# Patient Record
Sex: Male | Born: 1950
Health system: Southern US, Community
[De-identification: ages and names within clinical notes are randomized; demographics above are authoritative.]

## PROBLEM LIST (undated history)

## (undated) DIAGNOSIS — I10 Essential (primary) hypertension: Secondary | ICD-10-CM

## (undated) DIAGNOSIS — I6350 Cerebral infarction due to unspecified occlusion or stenosis of unspecified cerebral artery: Secondary | ICD-10-CM

## (undated) DIAGNOSIS — J439 Emphysema, unspecified: Secondary | ICD-10-CM

## (undated) DIAGNOSIS — R0683 Snoring: Secondary | ICD-10-CM

## (undated) DIAGNOSIS — I639 Cerebral infarction, unspecified: Secondary | ICD-10-CM

## (undated) DIAGNOSIS — D573 Sickle-cell trait: Secondary | ICD-10-CM

## (undated) DIAGNOSIS — A159 Respiratory tuberculosis unspecified: Secondary | ICD-10-CM

## (undated) HISTORY — DX: Respiratory tuberculosis unspecified: A15.9

## (undated) HISTORY — DX: Snoring: R06.83

## (undated) HISTORY — DX: Emphysema, unspecified: J43.9

## (undated) HISTORY — DX: Sickle-cell trait: D57.3

## (undated) HISTORY — DX: Cerebral infarction due to unspecified occlusion or stenosis of unspecified cerebral artery: I63.50

---

## 1975-03-10 HISTORY — PX: APPENDECTOMY: SHX54

## 1988-03-09 HISTORY — PX: VASECTOMY: SHX75

## 2002-01-26 ENCOUNTER — Ambulatory Visit (HOSPITAL_COMMUNITY): Admission: RE | Admit: 2002-01-26 | Discharge: 2002-01-26 | Payer: Self-pay | Admitting: Internal Medicine

## 2002-01-26 ENCOUNTER — Encounter: Payer: Self-pay | Admitting: Internal Medicine

## 2002-04-25 ENCOUNTER — Encounter: Payer: Self-pay | Admitting: Internal Medicine

## 2002-04-25 ENCOUNTER — Ambulatory Visit (HOSPITAL_COMMUNITY): Admission: RE | Admit: 2002-04-25 | Discharge: 2002-04-25 | Payer: Self-pay | Admitting: Internal Medicine

## 2002-11-06 ENCOUNTER — Inpatient Hospital Stay (HOSPITAL_COMMUNITY): Admission: EM | Admit: 2002-11-06 | Discharge: 2002-11-17 | Payer: Self-pay | Admitting: Psychiatry

## 2003-01-12 ENCOUNTER — Inpatient Hospital Stay (HOSPITAL_COMMUNITY): Admission: EM | Admit: 2003-01-12 | Discharge: 2003-01-22 | Payer: Self-pay | Admitting: Psychiatry

## 2004-12-31 ENCOUNTER — Ambulatory Visit: Payer: Self-pay | Admitting: Family Medicine

## 2005-01-21 ENCOUNTER — Ambulatory Visit: Payer: Self-pay | Admitting: *Deleted

## 2006-04-25 ENCOUNTER — Emergency Department (HOSPITAL_COMMUNITY): Admission: EM | Admit: 2006-04-25 | Discharge: 2006-04-25 | Payer: Self-pay | Admitting: Emergency Medicine

## 2006-04-29 ENCOUNTER — Emergency Department (HOSPITAL_COMMUNITY): Admission: EM | Admit: 2006-04-29 | Discharge: 2006-04-29 | Payer: Self-pay | Admitting: Emergency Medicine

## 2007-12-12 ENCOUNTER — Emergency Department (HOSPITAL_COMMUNITY): Admission: EM | Admit: 2007-12-12 | Discharge: 2007-12-12 | Payer: Self-pay | Admitting: Family Medicine

## 2008-06-05 ENCOUNTER — Emergency Department (HOSPITAL_COMMUNITY): Admission: EM | Admit: 2008-06-05 | Discharge: 2008-06-05 | Payer: Self-pay | Admitting: Family Medicine

## 2008-07-24 HISTORY — PX: PACEMAKER INSERTION: SHX728

## 2008-10-03 ENCOUNTER — Inpatient Hospital Stay (HOSPITAL_COMMUNITY): Admission: EM | Admit: 2008-10-03 | Discharge: 2008-10-10 | Payer: Self-pay | Admitting: Emergency Medicine

## 2008-10-03 DIAGNOSIS — I639 Cerebral infarction, unspecified: Secondary | ICD-10-CM | POA: Insufficient documentation

## 2008-10-03 HISTORY — DX: Cerebral infarction, unspecified: I63.9

## 2008-10-04 ENCOUNTER — Encounter (INDEPENDENT_AMBULATORY_CARE_PROVIDER_SITE_OTHER): Payer: Self-pay | Admitting: Internal Medicine

## 2008-10-05 ENCOUNTER — Encounter (INDEPENDENT_AMBULATORY_CARE_PROVIDER_SITE_OTHER): Payer: Self-pay | Admitting: Internal Medicine

## 2008-10-09 ENCOUNTER — Encounter (INDEPENDENT_AMBULATORY_CARE_PROVIDER_SITE_OTHER): Payer: Self-pay | Admitting: Cardiology

## 2008-10-09 ENCOUNTER — Encounter (INDEPENDENT_AMBULATORY_CARE_PROVIDER_SITE_OTHER): Payer: Self-pay | Admitting: Internal Medicine

## 2008-10-17 ENCOUNTER — Ambulatory Visit: Payer: Self-pay | Admitting: Internal Medicine

## 2008-10-17 DIAGNOSIS — H532 Diplopia: Secondary | ICD-10-CM | POA: Insufficient documentation

## 2008-10-17 DIAGNOSIS — R000 Tachycardia, unspecified: Secondary | ICD-10-CM | POA: Insufficient documentation

## 2008-10-17 DIAGNOSIS — I6350 Cerebral infarction due to unspecified occlusion or stenosis of unspecified cerebral artery: Secondary | ICD-10-CM

## 2008-10-17 DIAGNOSIS — R42 Dizziness and giddiness: Secondary | ICD-10-CM | POA: Insufficient documentation

## 2008-10-17 DIAGNOSIS — R269 Unspecified abnormalities of gait and mobility: Secondary | ICD-10-CM | POA: Insufficient documentation

## 2008-10-17 DIAGNOSIS — I498 Other specified cardiac arrhythmias: Secondary | ICD-10-CM | POA: Insufficient documentation

## 2008-10-17 HISTORY — DX: Cerebral infarction due to unspecified occlusion or stenosis of unspecified cerebral artery: I63.50

## 2008-10-26 ENCOUNTER — Ambulatory Visit: Payer: Self-pay | Admitting: Internal Medicine

## 2008-10-26 DIAGNOSIS — Z9189 Other specified personal risk factors, not elsewhere classified: Secondary | ICD-10-CM | POA: Insufficient documentation

## 2008-10-29 ENCOUNTER — Telehealth: Payer: Self-pay | Admitting: Licensed Clinical Social Worker

## 2008-10-29 ENCOUNTER — Encounter: Payer: Self-pay | Admitting: Licensed Clinical Social Worker

## 2008-11-02 ENCOUNTER — Ambulatory Visit (HOSPITAL_COMMUNITY): Admission: RE | Admit: 2008-11-02 | Discharge: 2008-11-02 | Payer: Self-pay | Admitting: Cardiology

## 2008-11-05 ENCOUNTER — Encounter (INDEPENDENT_AMBULATORY_CARE_PROVIDER_SITE_OTHER): Payer: Self-pay | Admitting: Internal Medicine

## 2008-11-13 ENCOUNTER — Encounter (INDEPENDENT_AMBULATORY_CARE_PROVIDER_SITE_OTHER): Payer: Self-pay | Admitting: Internal Medicine

## 2008-11-16 ENCOUNTER — Telehealth (INDEPENDENT_AMBULATORY_CARE_PROVIDER_SITE_OTHER): Payer: Self-pay | Admitting: *Deleted

## 2008-11-22 ENCOUNTER — Ambulatory Visit: Payer: Self-pay | Admitting: Internal Medicine

## 2008-11-22 ENCOUNTER — Encounter: Payer: Self-pay | Admitting: Internal Medicine

## 2008-11-22 DIAGNOSIS — I10 Essential (primary) hypertension: Secondary | ICD-10-CM | POA: Insufficient documentation

## 2008-11-22 LAB — CONVERTED CEMR LAB
ALT: 12 units/L (ref 0–53)
Alkaline Phosphatase: 93 units/L (ref 39–117)
Basophils Absolute: 0 10*3/uL (ref 0.0–0.1)
Basophils Relative: 0 % (ref 0–1)
CO2: 27 meq/L (ref 19–32)
Eosinophils Absolute: 0.2 10*3/uL (ref 0.0–0.7)
Glucose, Urine, Semiquant: NEGATIVE
MCHC: 33.6 g/dL (ref 30.0–36.0)
MCV: 85.6 fL (ref >=78.0)
Neutrophils Relative %: 64 % (ref 43–77)
Platelets: 246 10*3/uL (ref 150–400)
Protein, U semiquant: 30
RDW: 14.7 % (ref 11.5–15.5)
Sodium: 144 meq/L (ref 135–145)
Specific Gravity, Urine: >=1.03
Total Bilirubin: 1 mg/dL (ref 0.3–1.2)
Total Protein: 6.7 g/dL (ref 6.0–8.3)
WBC: 5.7 10*3/uL (ref 4.0–10.5)

## 2008-11-23 ENCOUNTER — Encounter: Payer: Self-pay | Admitting: Internal Medicine

## 2008-11-23 ENCOUNTER — Telehealth: Payer: Self-pay | Admitting: Infectious Disease

## 2008-12-10 ENCOUNTER — Encounter (INDEPENDENT_AMBULATORY_CARE_PROVIDER_SITE_OTHER): Payer: Self-pay | Admitting: Internal Medicine

## 2009-01-11 ENCOUNTER — Ambulatory Visit (HOSPITAL_COMMUNITY): Admission: RE | Admit: 2009-01-11 | Discharge: 2009-01-12 | Payer: Self-pay | Admitting: Cardiology

## 2009-01-16 ENCOUNTER — Ambulatory Visit: Payer: Self-pay | Admitting: Infectious Disease

## 2009-01-16 DIAGNOSIS — N529 Male erectile dysfunction, unspecified: Secondary | ICD-10-CM | POA: Insufficient documentation

## 2009-01-16 LAB — CONVERTED CEMR LAB
Bacteria, UA: NONE SEEN
Leukocytes, UA: NEGATIVE
Nitrite: NEGATIVE
Protein, ur: 30 mg/dL — AB
RBC / HPF: NONE SEEN (ref <3)
Urine Glucose: NEGATIVE mg/dL
pH: 7 (ref 5.0–8.0)

## 2009-08-15 ENCOUNTER — Emergency Department (HOSPITAL_COMMUNITY): Admission: EM | Admit: 2009-08-15 | Discharge: 2009-08-15 | Payer: Self-pay | Admitting: Emergency Medicine

## 2009-09-30 ENCOUNTER — Ambulatory Visit: Payer: Self-pay | Admitting: Internal Medicine

## 2009-09-30 DIAGNOSIS — G43009 Migraine without aura, not intractable, without status migrainosus: Secondary | ICD-10-CM | POA: Insufficient documentation

## 2009-09-30 DIAGNOSIS — E785 Hyperlipidemia, unspecified: Secondary | ICD-10-CM | POA: Insufficient documentation

## 2009-10-01 LAB — CONVERTED CEMR LAB
AST: 21 units/L (ref 0–37)
BUN: 14 mg/dL (ref 6–23)
Calcium: 9.5 mg/dL (ref 8.4–10.5)
Chloride: 103 meq/L (ref 96–112)
Creatinine, Ser: 0.88 mg/dL (ref 0.40–1.50)
Glucose, Bld: 91 mg/dL (ref 70–99)
HDL: 46 mg/dL (ref >39)
TSH: 0.79 microintl units/mL (ref 0.350–4.5)
Total CHOL/HDL Ratio: 3.6
Triglycerides: 60 mg/dL (ref <150)

## 2009-12-22 ENCOUNTER — Emergency Department (HOSPITAL_COMMUNITY)
Admission: EM | Admit: 2009-12-22 | Discharge: 2009-12-22 | Payer: Self-pay | Source: Home / Self Care | Admitting: Emergency Medicine

## 2010-04-08 NOTE — Assessment & Plan Note (Signed)
Summary: EST-CK/FU/MEDS/CFB   Vital Signs:  Patient profile:   60 year old male Height:      61 inches (154.94 cm) Weight:      157.3 pounds (71.50 kg) BMI:     29.83 Temp:     98.2 degrees F (36.78 degrees C) oral Pulse rate:   80 / minute BP sitting:   118 / 79  (left arm) Cuff size:   regular  Vitals Entered By: Cynda Familia Duncan Dull) (September 30, 2009 2:03 PM)   Primary Care Provider:  Lars Mage MD   History of Present Illness: [name] is a 60 year old man with PMH as described by EMR most notable for stroke in 2010 is here today for a regular follow up and medication refill.  He is not using a cane anymore and feels alot better. cell phone number: [phone]  He had pacemaker placement last Nov. He is feeling better after he had pacemaker.    He has had headaches since July 2010 after he had stroke. Still has headaches which start from the back of his head, 6-7/10 at its worse, described as pounding, no aura noted, last anywhere from few minutes to entire day. There is no memory loss or post ictal confusion after headaches. No aggravating or relieving factors.   He denies blood in his urine.   He lost his taste after the stroke and he says that he has been eating alot of junk food and wants to improve his eating habits.  He also relates impotence since he had the stroke which is unchanges, he did not take the pill as it costed around 20$ a pill.  Preventive Screening-Counseling & Management  Alcohol-Tobacco     Alcohol drinks/day: 0     Smoking Status: current     Smoking Cessation Counseling: yes     Packs/Day: 1 - 3 CIG     Year Started: 40 YEARS  Problems Prior to Update: 1)  Hyperlipidemia  (ICD-272.4) 2)  Erectile Dysfunction, Organic  (ICD-607.84) 3)  Hematuria Unspecified  (ICD-599.70) 4)  Hypertension  (ICD-401.9) 5)  Snoring, Hx of  (ICD-V15.89) 6)  Dizziness  (ICD-780.4) 7)  Unspecified Tachycardia  (ICD-785.0) 8)  Bradycardia  (ICD-427.89) 9)   Cerebrovascular Accident  (ICD-434.91) 10)  Gait Ataxia  (ICD-781.2) 11)  Diplopia  (ICD-368.2)  Medications Prior to Update: 1)  Pravachol 20 Mg Tabs (Pravastatin Sodium) .... Take 1 Tablet By Mouth Once A Day 2)  Hydrochlorothiazide 12.5 Mg Caps (Hydrochlorothiazide) .... Take 1 Capsule By Mouth Once A Day 3)  Metoprolol Tartrate 25 Mg Tabs (Metoprolol Tartrate) .... Take 1 Tablet By Mouth Two Times A Day 4)  Aspirin 325 Mg Tabs (Aspirin) .... Take 1 Tablet By Mouth Once A Day 5)  Chlorhexidine Gluconate 0.12 % Soln (Chlorhexidine Gluconate) .... Swish 15ml For 30 Seconds Then Spit Twice A Day  Current Medications (verified): 1)  Pravachol 20 Mg Tabs (Pravastatin Sodium) .... Take 1 Tablet By Mouth Once A Day 2)  Hydrochlorothiazide 12.5 Mg Caps (Hydrochlorothiazide) .... Take 1 Capsule By Mouth Once A Day 3)  Aspirin 325 Mg Tabs (Aspirin) .... Take 1 Tablet By Mouth Once A Day 4)  Propranolol Hcl 20 Mg Tabs (Propranolol Hcl) .... Take 1 Tablet By Mouth Two Times A Day  Allergies (verified): No Known Drug Allergies  Past History:  Past Medical History: Last updated: 10/17/2008 Tuberculosis (at age 21-3)  Past Surgical History: Last updated: 10/17/2008 appendectomy 1977 vasectomy 1990  Family History:  Last updated: 09/30/2009 Son- sickle cell disease Sister- cancer  Social History: Last updated: 10/17/2008 Occupation: unemployed Married Lives at home wife and step-daughter Current Smoker- 1.5 cigarettes/day  smoked 1pack/week for 37yrs. Alcohol use-no Drug use-no  Risk Factors: Alcohol Use: 0 (09/30/2009) Exercise: no (11/22/2008)  Risk Factors: Smoking Status: current (09/30/2009) Packs/Day: 1 - 3 CIG (09/30/2009)  Family History: Reviewed history from 10/17/2008 and no changes required. Son- sickle cell disease Sister- cancer  Social History: Reviewed history from 10/17/2008 and no changes required. Occupation: unemployed Married Lives at home wife and  step-daughter Current Smoker- 1.5 cigarettes/day  smoked 1pack/week for 56yrs. Alcohol use-no Drug use-no Packs/Day:  1 - 3 CIG  Review of Systems      See HPI  Physical Exam  Additional Exam:  Gen: AOx3, in no acute distress Eyes: PERRL, EOMI ENT:MMM, No erythema noted in posterior pharynx Neck: No JVD, No LAP Chest: CTAB with  good respiratory effort CVS: regular rhythmic rate, NO M/R/G, S1 S2 normal Abdo: soft,ND, BS+x4, Non tender and No hepatosplenomegaly EXT: No odema noted Neuro: Non focal, gait is normal Skin: no rashes noted.    Impression & Recommendations:  Problem # 1:  MIGRAINE WITHOUT AURA (ICD-346.10) Assessment Deteriorated  Patient is on metoprolol for BP and given his description of headaches, it is most c/w migraine. I will change his med to propranolol for prophylaxis against Migraine. i told him to take tylenol OTC/Ibuprofen for breakthrough pain.  The following medications were removed from the medication list:    Metoprolol Tartrate 25 Mg Tabs (Metoprolol tartrate) .Marland Kitchen... Take 1 tablet by mouth two times a day His updated medication list for this problem includes:    Aspirin 325 Mg Tabs (Aspirin) .Marland Kitchen... Take 1 tablet by mouth once a day    Propranolol Hcl 20 Mg Tabs (Propranolol hcl) .Marland Kitchen... Take 1 tablet by mouth two times a day  Problem # 2:  HYPERLIPIDEMIA (ICD-272.4) Assessment: Unchanged I will check his fasting lipid profile tomorrow.  His updated medication list for this problem includes:    Pravachol 20 Mg Tabs (Pravastatin sodium) .Marland Kitchen... Take 1 tablet by mouth once a day  Orders: T-Lipid Profile ([id number]) T-CMP with Estimated GFR ([id number])  Labs Reviewed: SGOT: 17 (11/22/2008)   SGPT: 12 (11/22/2008)   HDL:47 (11/22/2008)  LDL:114 (11/22/2008)  Chol:174 (11/22/2008)  Trig:65 (11/22/2008)  Problem # 3:  HYPERTENSION (ICD-401.9) At goal. Will check CMP for crt and K.  The following medications were removed from the medication  list:    Metoprolol Tartrate 25 Mg Tabs (Metoprolol tartrate) .Marland Kitchen... Take 1 tablet by mouth two times a day His updated medication list for this problem includes:    Hydrochlorothiazide 12.5 Mg Caps (Hydrochlorothiazide) .Marland Kitchen... Take 1 capsule by mouth once a day    Propranolol Hcl 20 Mg Tabs (Propranolol hcl) .Marland Kitchen... Take 1 tablet by mouth two times a day  BP today: 118/79 Prior BP: 137/89 (01/16/2009)  Labs Reviewed: K+: 4.3 (11/22/2008) Creat: : 0.96 (11/22/2008)   Chol: 174 (11/22/2008)   HDL: 47 (11/22/2008)   LDL: 114 (11/22/2008)   TG: 65 (11/22/2008)  Problem # 4:  CEREBROVASCULAR ACCIDENT (ICD-434.91) Assessment: Comment Only cont. aspirin for sec prophylasix.  His updated medication list for this problem includes:    Aspirin 325 Mg Tabs (Aspirin) .Marland Kitchen... Take 1 tablet by mouth once a day  Problem # 5:  DIPLOPIA (ICD-368.2) Assessment: Improved improved now.  Problem # 6:  GAIT ATAXIA (ICD-781.2) Assessment: Improved improved significantly and he  is not using his cane anymore.  Problem # 7:  Preventive Health Care (ICD-V70.0) Assessment: Comment Only Tdap, Gi referral and tsh will be chekced.  Reviewed preventive care protocols, scheduled due services, and updated immunizations.  Complete Medication List: 1)  Pravachol 20 Mg Tabs (Pravastatin sodium) .... Take 1 tablet by mouth once a day 2)  Hydrochlorothiazide 12.5 Mg Caps (Hydrochlorothiazide) .... Take 1 capsule by mouth once a day 3)  Aspirin 325 Mg Tabs (Aspirin) .... Take 1 tablet by mouth once a day 4)  Propranolol Hcl 20 Mg Tabs (Propranolol hcl) .... Take 1 tablet by mouth two times a day  Other Orders: Gastroenterology Referral (GI) Tdap => 66yrs IM (88416) Admin 1st Vaccine (60630) T-TSH [phone])  Patient Instructions: 1)  Please schedule a follow-up appointment in 6 months. 2)  Limit your Sodium (Salt). 3)  Tobacco is very bad for your health and your loved ones! You Should stop smoking!. 4)  Stop  Smoking Tips: Choose a Quit date. Cut down before the Quit date. decide what you will do as a substitute when you feel the urge to smoke(gum,toothpick,exercise). 5)  It is important that you exercise regularly at least 20 minutes 5 times a week. If you develop chest pain, have severe difficulty breathing, or feel very tired , stop exercising immediately and seek medical attention. 6)  Take an Aspirin every day. 7)  Check your Blood Pressure regularly. If it is above: 160/100you should make an appointment. Prescriptions: ASPIRIN 325 MG TABS (ASPIRIN) Take 1 tablet by mouth once a day  #30 x 12   Entered and Authorized by:   Lars Mage MD   Signed by:   Lars Mage MD on 09/30/2009   Method used:   Electronically to        [facility] [phone]* (retail)       [address]       [place], [place]  [address]       Ph: [phone]       Fax: [phone]   RxID:   [id number] HYDROCHLOROTHIAZIDE 12.5 MG CAPS (HYDROCHLOROTHIAZIDE) Take 1 capsule by mouth once a day  #30 x 11   Entered and Authorized by:   Lars Mage MD   Signed by:   Lars Mage MD on 09/30/2009   Method used:   Electronically to        [facility] [phone]* (retail)       [address]       [place], [place]  [address]       Ph: [phone]       Fax: [phone]   RxID:   [id number] PRAVACHOL 20 MG TABS (PRAVASTATIN SODIUM) Take 1 tablet by mouth once a day  #30 x 11   Entered and Authorized by:   Lars Mage MD   Signed by:   Lars Mage MD on 09/30/2009   Method used:   Electronically to        [facility] [phone]* (retail)       [address]       [place], [place]  [address]       Ph: [phone]       Fax: [phone]   RxID:   [id number] PROPRANOLOL HCL 20 MG TABS (PROPRANOLOL HCL) Take 1 tablet by mouth two times a day  #62 x 11   Entered and Authorized by:   Lars Mage MD   Signed by:   Lars Mage MD on  09/30/2009   Method used:   Electronically to        [facility] [phone]* (retail)       [address]       [place], [place]  [address]       Ph: [phone]       Fax: [phone]   RxID:   [id number]   Prevention & Chronic Care Immunizations   Influenza vaccine: Not documented   Influenza vaccine deferral: Deferred  (09/30/2009)    Tetanus booster: 09/30/2009: Tdap    Pneumococcal vaccine: Not documented    H. zoster vaccine: Not documented  Colorectal Screening   Hemoccult: Not documented   Hemoccult action/deferral: Deferred  (09/30/2009)    Colonoscopy: Not documented   Colonoscopy action/deferral: GI referral  (09/30/2009)  Other Screening   PSA: Not documented   PSA action/deferral: Discussed-PSA requested  (09/30/2009)   Smoking status: current  (09/30/2009)   Smoking cessation counseling: yes  (09/30/2009)  Lipids   Total Cholesterol: 174  (11/22/2008)   Lipid panel action/deferral: Lipid Panel ordered   LDL: 114  (11/22/2008)   LDL Direct: Not documented   HDL: 47  (11/22/2008)   Triglycerides: 65  (11/22/2008)    SGOT (AST): 17  (11/22/2008)   SGPT (ALT): 12  (11/22/2008)   Alkaline phosphatase: 93  (11/22/2008)   Total bilirubin: 1.0  (11/22/2008)  Hypertension   Last Blood Pressure: 118 / 79  (09/30/2009)   Serum creatinine: 0.96  (11/22/2008)   Serum potassium 4.3  (11/22/2008)    Hypertension flowsheet reviewed?: Yes   Progress toward BP goal: At goal  Self-Management Support :   Personal Goals (by the next clinic visit) :      Personal blood pressure goal: 140/90  (09/30/2009)   Hypertension self-management support: Written self-care plan  (09/30/2009)   Hypertension self-care plan printed.    Lipid self-management support: Not documented    Nursing Instructions: Give tetanus booster today GI referral for screening colonoscopy (see order)    Process Orders Check Orders Results:     [facility]: ABN not required for this insurance Tests Sent for requisitioning (October 01, 2009 10:07 PM):     09/30/2009: [facility] -- T-Lipid Profile [phone] (signed)     09/30/2009: [facility] -- T-CMP with Estimated GFR [[id number]] (signed)     09/30/2009: [facility] -- T-TSH [phone] (signed)    Immunizations Administered:  Tetanus Vaccine:    Vaccine Type: Tdap    Site: left deltoid    Mfr: [employer]    Dose: 0.5 ml    Route: IM    Given by: Cynda Familia (AAMA)    Exp. Date: 09/06/2011    Lot #: [id number]    VIS given: 01/25/07 version given September 30, 2009.

## 2010-04-09 ENCOUNTER — Telehealth: Payer: Self-pay | Admitting: *Deleted

## 2010-04-09 ENCOUNTER — Emergency Department (HOSPITAL_COMMUNITY)
Admission: EM | Admit: 2010-04-09 | Discharge: 2010-04-09 | Disposition: A | Discharge status: Home / Self Care | Payer: Medicaid Other | Attending: Emergency Medicine | Admitting: Emergency Medicine

## 2010-04-09 DIAGNOSIS — M7989 Other specified soft tissue disorders: Secondary | ICD-10-CM | POA: Insufficient documentation

## 2010-04-09 DIAGNOSIS — M79609 Pain in unspecified limb: Secondary | ICD-10-CM

## 2010-04-09 DIAGNOSIS — I10 Essential (primary) hypertension: Secondary | ICD-10-CM | POA: Insufficient documentation

## 2010-04-09 DIAGNOSIS — Z79899 Other long term (current) drug therapy: Secondary | ICD-10-CM | POA: Insufficient documentation

## 2010-04-09 DIAGNOSIS — I808 Phlebitis and thrombophlebitis of other sites: Secondary | ICD-10-CM | POA: Insufficient documentation

## 2010-04-09 DIAGNOSIS — Z950 Presence of cardiac pacemaker: Secondary | ICD-10-CM | POA: Insufficient documentation

## 2010-04-09 LAB — POCT I-STAT, CHEM 8
BUN: 12 mg/dL (ref 6–23)
Calcium, Ion: 1.21 mmol/L (ref 1.12–1.32)
Creatinine, Ser: 1 mg/dL (ref 0.4–1.5)
Glucose, Bld: 79 mg/dL (ref 70–99)
TCO2: 31 mmol/L (ref 0–100)

## 2010-04-09 LAB — PROTIME-INR: Prothrombin Time: 14 seconds (ref 11.6–15.2)

## 2010-04-09 NOTE — Telephone Encounter (Signed)
Pt came by and needs handicap sticker.  I have put it in your box to sign. Pt states he just had pacemaker placed. Will call pt when ready for pick-up.  Pt # [phone]

## 2010-04-14 NOTE — Telephone Encounter (Signed)
I have signed it and kept in the mail room.

## 2010-04-15 ENCOUNTER — Encounter: Payer: Self-pay | Admitting: Internal Medicine

## 2010-04-23 ENCOUNTER — Encounter: Payer: Self-pay | Admitting: Internal Medicine

## 2010-05-26 LAB — URINALYSIS, ROUTINE W REFLEX MICROSCOPIC
Glucose, UA: NEGATIVE mg/dL
Hgb urine dipstick: NEGATIVE
Specific Gravity, Urine: 1.015 (ref 1.005–1.030)
pH: 6.5 (ref 5.0–8.0)

## 2010-05-26 LAB — POCT I-STAT, CHEM 8
BUN: 12 mg/dL (ref 6–23)
Calcium, Ion: 1.14 mmol/L (ref 1.12–1.32)
Chloride: 102 mEq/L (ref 96–112)
Glucose, Bld: 96 mg/dL (ref 70–99)
HCT: 38 % — ABNORMAL LOW (ref 39.0–52.0)
Potassium: 3.7 mEq/L (ref 3.5–5.1)

## 2010-05-26 LAB — URINE MICROSCOPIC-ADD ON

## 2010-05-26 LAB — URINE CULTURE

## 2010-06-11 LAB — GLUCOSE, CAPILLARY

## 2010-06-14 LAB — CBC
HCT: 41.9 % (ref 39.0–52.0)
Hemoglobin: 14.4 g/dL (ref 13.0–17.0)
MCHC: 33.7 g/dL (ref 30.0–36.0)
MCHC: 34.1 g/dL (ref 30.0–36.0)
MCV: 88.7 fL (ref 78.0–100.0)
RBC: 4.73 MIL/uL (ref 4.22–5.81)
RBC: 4.86 MIL/uL (ref 4.22–5.81)
WBC: 4.7 10*3/uL (ref 4.0–10.5)
WBC: 5.3 10*3/uL (ref 4.0–10.5)

## 2010-06-14 LAB — BASIC METABOLIC PANEL
CO2: 30 mEq/L (ref 19–32)
Calcium: 9.3 mg/dL (ref 8.4–10.5)
Chloride: 101 mEq/L (ref 96–112)
Creatinine, Ser: 0.93 mg/dL (ref 0.4–1.5)
GFR calc Af Amer: >60 mL/min (ref >60)
Sodium: 136 mEq/L (ref 135–145)

## 2010-06-15 LAB — COMPREHENSIVE METABOLIC PANEL
ALT: 14 U/L (ref 0–53)
AST: 17 U/L (ref 0–37)
CO2: 28 mEq/L (ref 19–32)
Calcium: 8.9 mg/dL (ref 8.4–10.5)
Chloride: 106 mEq/L (ref 96–112)
Creatinine, Ser: 0.94 mg/dL (ref 0.4–1.5)
GFR calc non Af Amer: >60 mL/min (ref >60)
Glucose, Bld: 101 mg/dL — ABNORMAL HIGH (ref 70–99)
Total Bilirubin: 0.6 mg/dL (ref 0.3–1.2)

## 2010-06-15 LAB — BASIC METABOLIC PANEL
BUN: 11 mg/dL (ref 6–23)
BUN: 11 mg/dL (ref 6–23)
BUN: 7 mg/dL (ref 6–23)
CO2: 28 mEq/L (ref 19–32)
CO2: 28 mEq/L (ref 19–32)
Calcium: 9 mg/dL (ref 8.4–10.5)
Calcium: 9 mg/dL (ref 8.4–10.5)
Calcium: 9.1 mg/dL (ref 8.4–10.5)
Chloride: 106 mEq/L (ref 96–112)
Creatinine, Ser: 0.82 mg/dL (ref 0.4–1.5)
Creatinine, Ser: 0.89 mg/dL (ref 0.4–1.5)
GFR calc Af Amer: >60 mL/min (ref >60)
GFR calc non Af Amer: >60 mL/min (ref >60)
Glucose, Bld: 123 mg/dL — ABNORMAL HIGH (ref 70–99)
Glucose, Bld: 97 mg/dL (ref 70–99)
Glucose, Bld: 99 mg/dL (ref 70–99)
Potassium: 4 mEq/L (ref 3.5–5.1)

## 2010-06-15 LAB — CBC
HCT: 39.7 % (ref 39.0–52.0)
MCHC: 33.9 g/dL (ref 30.0–36.0)
MCHC: 34.1 g/dL (ref 30.0–36.0)
MCV: 87.7 fL (ref 78.0–100.0)
MCV: 88.6 fL (ref 78.0–100.0)
Platelets: 180 10*3/uL (ref 150–400)
Platelets: 184 10*3/uL (ref 150–400)
Platelets: 196 10*3/uL (ref 150–400)
RDW: 14.7 % (ref 11.5–15.5)
RDW: 14.8 % (ref 11.5–15.5)
RDW: 14.9 % (ref 11.5–15.5)
WBC: 4.6 10*3/uL (ref 4.0–10.5)

## 2010-06-15 LAB — LIPID PANEL
Total CHOL/HDL Ratio: 4.1 RATIO
VLDL: 25 mg/dL (ref 0–40)

## 2010-06-15 LAB — HEMOGLOBIN A1C: Hgb A1c MFr Bld: 5.5 % (ref 4.6–6.1)

## 2010-06-15 LAB — MAGNESIUM: Magnesium: 2.1 mg/dL (ref 1.5–2.5)

## 2010-06-15 LAB — DIFFERENTIAL
Basophils Absolute: 0 10*3/uL (ref 0.0–0.1)
Basophils Relative: 1 % (ref 0–1)
Eosinophils Absolute: 0.2 10*3/uL (ref 0.0–0.7)
Monocytes Relative: 6 % (ref 3–12)
Neutro Abs: 2.8 10*3/uL (ref 1.7–7.7)
Neutrophils Relative %: 63 % (ref 43–77)

## 2010-06-15 LAB — RAPID URINE DRUG SCREEN, HOSP PERFORMED
Barbiturates: NOT DETECTED
Benzodiazepines: NOT DETECTED
Cocaine: NOT DETECTED
Opiates: NOT DETECTED

## 2010-06-15 LAB — CK TOTAL AND CKMB (NOT AT ARMC)
CK, MB: 1.1 ng/mL (ref 0.3–4.0)
Relative Index: INVALID (ref 0.0–2.5)
Relative Index: INVALID (ref 0.0–2.5)
Total CK: 120 U/L (ref 7–232)
Total CK: 93 U/L (ref 7–232)
Total CK: 96 U/L (ref 7–232)

## 2010-06-15 LAB — VITAMIN B12: Vitamin B-12: 293 pg/mL (ref 211–911)

## 2010-06-15 LAB — TSH: TSH: 0.751 u[IU]/mL (ref 0.350–4.500)

## 2010-06-19 LAB — POCT URINALYSIS DIP (DEVICE)
Bilirubin Urine: NEGATIVE
Glucose, UA: NEGATIVE mg/dL
Ketones, ur: NEGATIVE mg/dL
Specific Gravity, Urine: 1.015 (ref 1.005–1.030)

## 2010-06-24 ENCOUNTER — Ambulatory Visit (INDEPENDENT_AMBULATORY_CARE_PROVIDER_SITE_OTHER): Payer: Medicaid Other | Admitting: Internal Medicine

## 2010-06-24 ENCOUNTER — Encounter: Payer: Self-pay | Admitting: Internal Medicine

## 2010-06-24 VITALS — BP 153/86 | HR 82 | Temp 97.4°F | Resp 20 | Ht 73.5 in | Wt 166.5 lb

## 2010-06-24 DIAGNOSIS — R319 Hematuria, unspecified: Secondary | ICD-10-CM

## 2010-06-24 DIAGNOSIS — M549 Dorsalgia, unspecified: Secondary | ICD-10-CM

## 2010-06-24 DIAGNOSIS — N529 Male erectile dysfunction, unspecified: Secondary | ICD-10-CM

## 2010-06-24 DIAGNOSIS — I10 Essential (primary) hypertension: Secondary | ICD-10-CM

## 2010-06-24 DIAGNOSIS — E785 Hyperlipidemia, unspecified: Secondary | ICD-10-CM

## 2010-06-24 DIAGNOSIS — Z1211 Encounter for screening for malignant neoplasm of colon: Secondary | ICD-10-CM

## 2010-06-24 LAB — URINALYSIS
Hgb urine dipstick: NEGATIVE
Nitrite: NEGATIVE
Specific Gravity, Urine: 1.018 (ref 1.005–1.030)
Urobilinogen, UA: 1 mg/dL (ref 0.0–1.0)
pH: 7 (ref 5.0–8.0)

## 2010-06-24 LAB — LIPID PANEL
HDL: 53 mg/dL (ref >39)
Triglycerides: 67 mg/dL (ref <150)

## 2010-06-24 LAB — TSH: TSH: 1.446 u[IU]/mL (ref 0.350–4.500)

## 2010-06-24 MED ORDER — SILDENAFIL CITRATE 100 MG PO TABS: 100.0000 mg | ORAL_TABLET | ORAL | Status: DC | PRN

## 2010-06-24 MED ORDER — MELOXICAM 15 MG PO TABS: ORAL_TABLET | ORAL | Status: DC

## 2010-06-24 NOTE — Assessment & Plan Note (Signed)
Refill for Viagra today.

## 2010-06-24 NOTE — Progress Notes (Signed)
  Subjective:    Patient ID: [name], male    DOB: 1950-11-18, 60 y.o.   MRN: [id number]  HPI [name] is a 60 year old man with PMH as per the chart most notable for uncontrolled HTN, hyperlipidemia, CVA and unspecified hematuria.  Patient is complaining of pain in his lower back which has been going on for a very long time. Patient is worried about colon cancer and prostate cancer and wants himself be checked regarding these things. He says that he recently had a lot of people in his church suffering from these conditions which scared him. Pain is described as mild to moderate, 7-8/10 at its worse,exacerbated by bowing down and relieved with rest. There is no fever, no recent weight loss, history of cancer, change in bladder or bowel movements, tingling or weakness in his legs.  Patient wants his viagra to be refilled. He denies any chest pain or shortness of breath with the use of viagra.  The patient was referred for colonoscopy and Hemoccult cards were given.    Review of Systems  Constitutional: Negative for fever, activity change and appetite change.  HENT: Negative for sore throat.   Respiratory: Negative for cough and shortness of breath.   Cardiovascular: Negative for chest pain and leg swelling.  Gastrointestinal: Negative for nausea, abdominal pain, diarrhea, constipation and abdominal distention.  Genitourinary: Negative for frequency, hematuria and difficulty urinating.  Musculoskeletal: Positive for back pain.  Neurological: Negative for dizziness and headaches.  Psychiatric/Behavioral: Negative for suicidal ideas and behavioral problems.       Objective:   Physical Exam  Constitutional: He is oriented to person, place, and time. He appears well-developed and well-nourished.  HENT:  Head: Normocephalic and atraumatic.  Eyes: Conjunctivae and EOM are normal. Pupils are equal, round, and reactive to light. No scleral icterus.  Neck: Normal range of motion. Neck  supple. No JVD present. No thyromegaly present.  Cardiovascular: Normal rate, regular rhythm, normal heart sounds and intact distal pulses.  Exam reveals no gallop and no friction rub.   No murmur heard. Pulmonary/Chest: Effort normal and breath sounds normal. No respiratory distress. He has no wheezes. He has no rales.  Abdominal: Soft. Bowel sounds are normal. He exhibits no distension and no mass. There is no tenderness. There is no rebound and no guarding.  Musculoskeletal: Normal range of motion. He exhibits no edema and no tenderness.  Lymphadenopathy:    He has no cervical adenopathy.  Neurological: He is alert and oriented to person, place, and time.  Psychiatric: He has a normal mood and affect. His behavior is normal.          Assessment & Plan:

## 2010-06-24 NOTE — Assessment & Plan Note (Signed)
Check lipid profile today. 

## 2010-06-24 NOTE — Assessment & Plan Note (Signed)
Patient mentions that he was seen about 6 months ago at [facility] and was told that he had some kidney problems. We do not have any records of that and given that we have a history of abnormal urine analysis with hematuria I would repeat urinalysis today and if patient does have hematuria on urinalysis I would refer him for further urological workup.

## 2010-06-24 NOTE — Patient Instructions (Signed)
Back Pain (Lumbosacral Strain) Back pain is one of the most common causes of pain. There are many causes of back pain. Most are not serious conditions.  CAUSES Your backbone (spinal column) is made up of 24 main vertebral bodies, the sacrum, and the coccyx. These are held together by muscles and tough, fibrous tissue (ligaments). Nerve roots pass through the openings between the vertebrae. A sudden move or injury to the back may cause injury to, or pressure on, these nerves. This may result in localized back pain or pain movement (radiation) into the buttocks, down the leg, and into the foot. Sharp, shooting pain from the buttock down the back of the leg (sciatica) is frequently associated with a ruptured (herniated) disc. Pain may be caused by muscle spasm alone. Your caregiver can often find the cause of your pain by the details of your symptoms and an exam. In some cases, you may need tests (such as X-rays). Your caregiver will work with you to decide if any tests are needed based on your specific exam. HOME CARE INSTRUCTIONS  Avoid an underactive lifestyle. Active exercise, as directed by your caregiver, is your greatest weapon against back pain.   Avoid hard physical activities (tennis, racquetball, water-skiing) if you are not in proper physical condition for it. This may aggravate and/or create problems.   If you have a back problem, avoid sports requiring sudden body movements. Swimming and walking are generally safer activities.   Maintain good posture.   Avoid becoming overweight (obese).   Use bed rest for only the most extreme, sudden (acute) episode. Your caregiver will help you determine how much bed rest is necessary.   For acute conditions, you may put ice on the injured area.   Put ice in a plastic bag.   Place a towel between your skin and the bag.   Leave the ice on for 15 minutes at a time, every 2 hours, or as needed.   After you are improved and more active, it may  help to apply heat for 30 minutes before activities.  See your caregiver if you are having pain that lasts longer than expected. Your caregiver can advise appropriate exercises and/or therapy if needed. With conditioning, most back problems can be avoided. SEEK IMMEDIATE MEDICAL CARE IF:  You have numbness, tingling, weakness, or problems with the use of your arms or legs.   You experience severe back pain not relieved with medicines.   There is a change in bowel or bladder control.   You have increasing pain in any area of the body, including your belly (abdomen).   You notice shortness of breath, dizziness, or feel faint.   You feel sick to your stomach (nauseous), are throwing up (vomiting), or become sweaty.   You notice discoloration of your toes or legs, or your feet get very cold.   Your back pain is getting worse.   You have an oral temperature above 101F, not controlled by medicine.  MAKE SURE YOU:   Understand these instructions.   Will watch your condition.   Will get help right away if you are not doing well or get worse.  Document Released: 12/03/2004 Document Re-Released: 05/20/2009 [facility] Patient Information 2011 [place], [place].   Set up appointment in 1 week time.

## 2010-06-24 NOTE — Assessment & Plan Note (Signed)
Patient's blood pressure has always been really high and has been very difficult to manage. I discussed cutting down on the smoking as one of the things. Would call this patient back in 2 weeks to focus on blood pressure management. Would check the neck today for potassium and creatinine.

## 2010-06-25 LAB — COMPLETE METABOLIC PANEL WITH GFR
AST: 26 U/L (ref 0–37)
Alkaline Phosphatase: 87 U/L (ref 39–117)
CO2: 30 mEq/L (ref 19–32)
Chloride: 101 mEq/L (ref 96–112)
Glucose, Bld: 80 mg/dL (ref 70–99)
Potassium: 4.9 mEq/L (ref 3.5–5.3)
Sodium: 140 mEq/L (ref 135–145)
Total Bilirubin: 0.8 mg/dL (ref 0.3–1.2)

## 2010-07-01 ENCOUNTER — Ambulatory Visit (INDEPENDENT_AMBULATORY_CARE_PROVIDER_SITE_OTHER): Payer: Medicaid Other | Admitting: Internal Medicine

## 2010-07-01 ENCOUNTER — Encounter: Payer: Self-pay | Admitting: Internal Medicine

## 2010-07-01 VITALS — BP 158/112 | HR 85 | Temp 97.4°F | Ht 61.0 in | Wt 168.6 lb

## 2010-07-01 DIAGNOSIS — Z559 Problems related to education and literacy, unspecified: Secondary | ICD-10-CM

## 2010-07-01 DIAGNOSIS — Z720 Tobacco use: Secondary | ICD-10-CM | POA: Insufficient documentation

## 2010-07-01 DIAGNOSIS — F17200 Nicotine dependence, unspecified, uncomplicated: Secondary | ICD-10-CM

## 2010-07-01 DIAGNOSIS — I10 Essential (primary) hypertension: Secondary | ICD-10-CM

## 2010-07-01 MED ORDER — AZILSARTAN-CHLORTHALIDONE 40-25 MG PO TABS: 1.0000 | ORAL_TABLET | Freq: Once | ORAL | Status: DC

## 2010-07-01 NOTE — Assessment & Plan Note (Addendum)
Patient is currently on hydrochlorothiazide and propranolol. I would discontinue hydrochlorothiazide at this time and start him on chlorthalidone 25 mg along with azilsartan. I would call the patient back in 2 weeks for a bmet check for potassium and creatinine. If the blood pressure is still high the next office visit we should consider adding Norvasc/Aldactone to his regimen next month. It will take at least 4-6 weeks for diuretic to start working. I would change medications in him off her at least 6 weeks of current therapy.

## 2010-07-01 NOTE — Progress Notes (Signed)
  Subjective:    Patient ID: [name], male    DOB: 1951-01-02, 60 y.o.   MRN: [id number]  HPI  Patient is 60 year old man with past medical history of CVA. This is the second visit with him. I saw him 2 weeks ago for high blood pressure and preventative health care. Today's office visit is mainly to concentrate on the blood pressure and smoking cessation.  Patient's blood pressure is high today 152/112 and it has been very resistant to medicines the past.   Patient complains of some blurring of vision though diplopia has been mentioned as one of his problems in the past.  The patient says that the back pain is much better with the therapy that he is started.  Review of Systems  Constitutional: Negative for fever, activity change and appetite change.  HENT: Negative for sore throat.   Respiratory: Negative for cough and shortness of breath.   Cardiovascular: Negative for chest pain and leg swelling.  Gastrointestinal: Negative for nausea, abdominal pain, diarrhea, constipation and abdominal distention.  Genitourinary: Negative for frequency, hematuria and difficulty urinating.  Neurological: Negative for dizziness and headaches.  Psychiatric/Behavioral: Negative for suicidal ideas and behavioral problems.       Objective:   Physical Exam  Constitutional: He is oriented to person, place, and time. He appears well-developed and well-nourished.  HENT:  Head: Normocephalic and atraumatic.  Eyes: Conjunctivae and EOM are normal. Pupils are equal, round, and reactive to light. No scleral icterus.  Neck: Normal range of motion. Neck supple. No JVD present. No thyromegaly present.  Cardiovascular: Normal rate, regular rhythm, normal heart sounds and intact distal pulses.  Exam reveals no gallop and no friction rub.   No murmur heard. Pulmonary/Chest: Effort normal and breath sounds normal. No respiratory distress. He has no wheezes. He has no rales.  Abdominal: Soft. Bowel sounds  are normal. He exhibits no distension and no mass. There is no tenderness. There is no rebound and no guarding.  Musculoskeletal: Normal range of motion. He exhibits no edema and no tenderness.  Lymphadenopathy:    He has no cervical adenopathy.  Neurological: He is alert and oriented to person, place, and time.  Psychiatric: He has a normal mood and affect. His behavior is normal.          Assessment & Plan:

## 2010-07-01 NOTE — Patient Instructions (Addendum)
Smoking Cessation This document explains the best ways for you to quit smoking and new treatments to help. It lists new medicines that can double or triple your chances of quitting and quitting for good. It also considers ways to avoid relapses and concerns you may have about quitting, including weight gain. NICOTINE: A POWERFUL ADDICTION If you have tried to quit smoking, you know how hard it can be. It is hard because nicotine is a very addictive drug. For some people, it can be as addictive as heroin or cocaine. Usually, people make 2 or 3 tries, or more, before finally being able to quit. Each time you try to quit, you can learn about what helps and what hurts. Quitting takes hard work and a lot of effort, but you can quit smoking. QUITTING SMOKING IS ONE OF THE MOST IMPORTANT THINGS YOU WILL EVER DO:  You will live longer, feel better, and live better.   The impact on your body of quitting smoking is felt almost immediately:   Within 20 minutes, blood pressure decreases. Pulse returns to its normal level.   After 8 hours, carbon monoxide levels in the blood return to normal. Oxygen level increases.   After 24 hours, chance of heart attack starts to decrease. Breath, hair, and body stop smelling like smoke.   After 48 hours, damaged nerve endings begin to recover. Sense of taste and smell improve.   After 72 hours, the body is virtually free of nicotine. Bronchial tubes relax and breathing becomes easier.   After 2 to 12 weeks, lungs can hold more air. Exercise becomes easier and circulation improves.   Quitting will lower your chance of having a heart attack, stroke, cancer, or lung disease:   After 1 year, the risk of coronary heart disease is cut in half.   After 5 years, the risk of stroke falls to the same as a nonsmoker.   After 10 years, the risk of lung cancer is cut in half and the risk of other cancers decreases significantly.   After 15 years, the risk of coronary heart  disease drops, usually to the level of a nonsmoker.   If you are pregnant, quitting smoking will improve your chances of having a healthy baby.   The people you live with, especially your children, will be healthier.   You will have extra money to spend on things other than cigarettes.  FIVE KEYS TO QUITTING Studies have shown that these 5 steps will help you quit smoking and quit for good. You have the best chances of quitting if you use them together: 1. Get ready.  2. Get support and encouragement.  3. Learn new skills and behaviors.  4. Get medicine to reduce your nicotine addiction and use it correctly.  5. Be prepared for relapse or difficult situations. Be determined to continue trying to quit, even if you do not succeed at first.  1. GET READY  Set a quit date.   Change your environment.   Get rid of ALL cigarettes, ashtrays, matches, and lighters in your home, car, and place of work.   Do not let people smoke in your home.   Review your past attempts to quit. Think about what worked and what did not.   Once you quit, do not smoke. NOT EVEN A PUFF!  2. GET SUPPORT AND ENCOURAGEMENT Studies have shown that you have a better chance of being successful if you have help. You can get support in many ways.  Tell  your family, friends, and coworkers that you are going to quit and need their support. Ask them not to smoke around you.   Talk to your caregivers (doctor, dentist, nurse, pharmacist, psychologist, and/or smoking counselor).   Get individual, group, or telephone counseling and support. The more counseling you have, the better your chances are of quitting. Programs are available at local hospitals and health centers. Call your local health department for information about programs in your area.   Spiritual beliefs and practices may help some smokers quit.   Quit meters are small computer programs online or downloadable that keep track of quit statistics, such as amount  of "quit-time," cigarettes not smoked, and money saved.   Many smokers find one or more of the many self-help books available useful in helping them quit and stay off tobacco.  3. LEARN NEW SKILLS AND BEHAVIORS  Try to distract yourself from urges to smoke. Talk to someone, go for a walk, or occupy your time with a task.   When you first try to quit, change your routine. Take a different route to work. Drink tea instead of coffee. Eat breakfast in a different place.   Do something to reduce your stress. Take a hot bath, exercise, or read a book.   Plan something enjoyable to do every day. Reward yourself for not smoking.   Explore interactive web-based programs that specialize in helping you quit.  4. GET MEDICINE AND USE IT CORRECTLY Medicines can help you stop smoking and decrease the urge to smoke. Combining medicine with the above behavioral methods and support can quadruple your chances of successfully quitting smoking. The U.[place]. Food and Drug Administration (FDA) has approved 7 medicines to help you quit smoking. These medicines fall into 3 categories.  Nicotine replacement therapy (delivers nicotine to your body without the negative effects and risks of smoking):   Nicotine gum: Available over-the-counter.   Nicotine lozenges: Available over-the-counter.   Nicotine inhaler: Available by prescription.   Nicotine nasal spray: Available by prescription.   Nicotine skin patches (transdermal): Available by prescription and over-the-counter.   Antidepressant medicine (helps people abstain from smoking, but how this works is unknown):   Bupropion sustained-release (SR) tablets: Available by prescription.   Nicotinic receptor partial agonist (simulates the effect of nicotine in your brain):   Varenicline tartrate tablets: Available by prescription.   Ask your caregiver for advice about which medicines to use and how to use them. Carefully read the information on the package.    Everyone who is trying to quit may benefit from using a medicine. If you are pregnant or trying to become pregnant, nursing an infant, you are under age 18, or you smoke fewer than 10 cigarettes per day, talk to your caregiver before taking any nicotine replacement medicines.   You should stop using a nicotine replacement product and call your caregiver if you experience nausea, dizziness, weakness, vomiting, fast or irregular heartbeat, mouth problems with the lozenge or gum, or redness or swelling of the skin around the patch that does not go away.   Do not use any other product containing nicotine while using a nicotine replacement product.   Talk to your caregiver before using these products if you have diabetes, heart disease, asthma, stomach ulcers, you had a recent heart attack, you have high blood pressure that is not controlled with medicine, a history of irregular heartbeat, or you have been prescribed medicine to help you quit smoking.  5. BE PREPARED FOR RELAPSE OR   DIFFICULT SITUATIONS  Most relapses occur within the first 3 months after quitting. Do not be discouraged if you start smoking again. Remember, most people try several times before they finally quit.   You may have symptoms of withdrawal because your body is used to nicotine. You may crave cigarettes, be irritable, feel very hungry, cough often, get headaches, or have difficulty concentrating.   The withdrawal symptoms are only temporary. They are strongest when you first quit, but they will go away within 10 to 14 days.  Here are some difficult situations to watch for:  Alcohol. Avoid drinking alcohol. Drinking lowers your chances of successfully quitting.   Caffeine. Try to reduce the amount of caffeine you consume. It also lowers your chances of successfully quitting.   Other smokers. Being around smoking can make you want to smoke. Avoid smokers.   Weight gain. Many smokers will gain weight when they quit, usually  less than 10 pounds. Eat a healthy diet and stay active. Do not let weight gain distract you from your main goal, quitting smoking. Some medicines that help you quit smoking may also help delay weight gain. You can always lose the weight gained after you quit.   Bad mood or depression. There are a lot of ways to improve your mood other than smoking.  If you are having problems with any of these situations, talk to your caregiver. SPECIAL SITUATIONS OR CONDITIONS Studies suggest that everyone can quit smoking. Your situation or condition can give you a special reason to quit.  Pregnant women/New mothers: By quitting, you protect your baby's health and your own.   Hospitalized patients: By quitting, you reduce health problems and help healing.   Heart attack patients: By quitting, you reduce your risk of a second heart attack.   Lung, head, and neck cancer patients: By quitting, you reduce your chance of a second cancer.   Parents of children and adolescents: By quitting, you protect your children from illnesses caused by secondhand smoke.  QUESTIONS TO THINK ABOUT Think about the following questions before you try to stop smoking. You may want to talk about your answers with your caregiver.  Why do you want to quit?   If you tried to quit in the past, what helped and what did not?   What will be the most difficult situations for you after you quit? How will you plan to handle them?   Who can help you through the tough times? Your family? Friends? Caregiver?   What pleasures do you get from smoking? What ways can you still get pleasure if you quit?  Here are some questions to ask your caregiver:  How can you help me to be successful at quitting?   What medicine do you think would be best for me and how should I take it?   What should I do if I need more help?   What is smoking withdrawal like? How can I get information on withdrawal?  Quitting takes hard work and a lot of effort,  but you can quit smoking. FOR MORE INFORMATION Smokefree.gov (http://www.davis-sullivan.com/) provides free, accurate, evidence-based information and professional assistance to help support the immediate and long-term needs of people trying to quit smoking. Document Released: 02/17/2001 Document Re-Released: 08/13/2009 [facility] Patient Information 2011 [place], [place].   Lab appointment in 2 weeks. Clinic appointment in 2 weeks. Smoking cessation counselling today.

## 2010-07-02 ENCOUNTER — Ambulatory Visit: Payer: Self-pay | Admitting: Licensed Clinical Social Worker

## 2010-07-02 DIAGNOSIS — Z720 Tobacco use: Secondary | ICD-10-CM

## 2010-07-02 NOTE — Progress Notes (Signed)
SW Visit of 07/01/10.  Smoking Cessation Counseling.  Established relationship with patient and assessed for readiness to quit smoking.   [name] has been smoking since age 60 and now smokes about a pack per week.  He is a first thing in the morning smoker with his coffee.  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  His reasons for quitting cited were:  Setting a good example to church members, health, and readiness.   Various tips were shared with [name] and smoking cessation handout given.   Education about medication and how it increases the success rate was shared.  [name] was interested in joining the Quitline and trying to obtain free patches thru this service and said he would call there this PM.    [name] said he would be 60 yo in October and he would like to quit by his birthday.  He has my card for any questions or concerns.

## 2010-07-09 ENCOUNTER — Ambulatory Visit: Payer: Medicaid Other | Attending: Internal Medicine | Admitting: Physical Therapy

## 2010-07-17 ENCOUNTER — Ambulatory Visit (INDEPENDENT_AMBULATORY_CARE_PROVIDER_SITE_OTHER): Payer: Self-pay | Admitting: Internal Medicine

## 2010-07-17 ENCOUNTER — Encounter: Payer: Self-pay | Admitting: Internal Medicine

## 2010-07-17 VITALS — BP 176/118 | HR 76 | Temp 97.5°F | Ht 73.25 in | Wt 167.2 lb

## 2010-07-17 DIAGNOSIS — I10 Essential (primary) hypertension: Secondary | ICD-10-CM

## 2010-07-17 MED ORDER — LISINOPRIL-HYDROCHLOROTHIAZIDE 20-25 MG PO TABS: 1.0000 | ORAL_TABLET | Freq: Every day | ORAL | Status: DC

## 2010-07-17 NOTE — Patient Instructions (Addendum)
Start Lisinopril/Hydrochlorothiazide 20/25 mg Take 1 tablet daily for blood pressure.  Come back in 2 weeks for a blood test and recheck of your blood pressure.  If you have any problems with getting or taking the medicine please call the clinic at [phone].

## 2010-07-17 NOTE — Progress Notes (Signed)
  Subjective:    Patient ID: [name], male    DOB: March 21, 1950, 60 y.o.   MRN: [id number]  HPI  Mr. Thal is a 60 year old man who presents to clinic today for a follow up of his blood pressure from his last visit.  At his last visit he was changed from HCTZ to Azilsartan/Chlorthalidone to try to get better control of his blood pressure.  He has not started the medication because he could not afford the 90 dollar cost when he went to pick up the medication.  He has not also been taking his previous HCTZ so today his blood pressure is higher then previously.  He has continued his propranolol which he also uses for migraine prophylaxis.  He has not had any problems with side effects from his higher blood pressure including increased headaches, blurry vision, decreased urination, or chest pain.  In general he states that he feels pretty good.  Review of Systems    Constitutional: Denies fever, chills, diaphoresis, appetite change and fatigue.  HEENT: Denies photophobia, eye pain, redness, hearing loss, ear pain, congestion, sore throat, rhinorrhea, sneezing, mouth sores, trouble swallowing, neck pain, neck stiffness and tinnitus.   Respiratory: Denies SOB, DOE, cough, chest tightness,  and wheezing.   Cardiovascular: Denies chest pain, palpitations and leg swelling.  Gastrointestinal: Denies nausea, vomiting, abdominal pain, diarrhea, constipation, blood in stool and abdominal distention.  Genitourinary: Denies dysuria, urgency, frequency, hematuria, flank pain and difficulty urinating.  Musculoskeletal: Denies myalgias, back pain, joint swelling, arthralgias and gait problem.  Skin: Denies pallor, rash and wound.  Neurological: Denies dizziness, seizures, syncope, weakness, light-headedness, numbness and headaches.  Hematological: Denies adenopathy. Easy bruising, personal or family bleeding history  Psychiatric/Behavioral: Denies suicidal ideation, mood changes, confusion, nervousness,  sleep disturbance and agitation  Objective:   Physical Exam    Constitutional: Vital signs reviewed.  Patient is a well-developed and well-nourished man in no acute distress and cooperative with exam. Alert and oriented x3.  Head: Normocephalic and atraumatic Ear: TM normal bilaterally Mouth: no erythema or exudates, MMM Eyes: PERRL, EOMI, conjunctivae normal, No scleral icterus.  Neck: Supple, Trachea midline normal ROM, No JVD, mass, thyromegaly, or carotid bruit present.  Cardiovascular: Pacemaker insertion site well healed. RRR, S1 normal, S2 normal, no MRG, pulses symmetric and intact bilaterally Pulmonary/Chest: CTAB, no wheezes, rales, or rhonchi Abdominal: Soft. Non-tender, non-distended, bowel sounds are normal, no masses, organomegaly, or guarding present.  GU: no CVA tenderness Musculoskeletal: No joint deformities, erythema, or stiffness, ROM full and no nontender Hematology: no cervical, inginal, or axillary adenopathy.  Neurological: A&O x3, Strength is normal and symmetric bilaterally, cranial nerve II-XII are grossly intact, no focal motor deficit, sensory intact to light touch bilaterally.  Skin: Warm, dry and intact. No rash, cyanosis, or clubbing.  Psychiatric: Normal mood and affect. speech and behavior is normal. Judgment and thought content normal. Cognition and memory are normal.    Assessment & Plan:

## 2010-07-18 ENCOUNTER — Encounter: Payer: Self-pay | Admitting: Internal Medicine

## 2010-07-18 NOTE — Assessment & Plan Note (Addendum)
BP Readings from Last 3 Encounters:  07/17/10 176/118  07/01/10 158/112  06/24/10 153/86   Blood pressure today is increased over his previous because he has not been taking his HCTZ and was unable to afford the Azilsartan/chlorthalidone combination that was prescribed at the last visit.  Looking in his records in [facility] and [facility] it appears that he has never been on an ACEI so I will plan on starting him on Lisinopril/HCTZ 20/25 once daily and have him return in 2 weeks for a blood pressure recheck and Bmet to check his creatinine and electrolytes. He was informed that in the future if he has a problem getting and taking the medication that he should call the office so an alternative could be prescribed before his next visit.

## 2010-07-22 NOTE — H&P (Signed)
[name], BAYLE NO.:  [id number]   MEDICAL RECORD NO.:  [id number]          PATIENT TYPE:  INP   LOCATION:  1833                         FACILITY:  [facility]   PHYSICIAN:  Eduard Clos, MDDATE OF BIRTH:  05/30/1950   DATE OF ADMISSION:  10/03/2008  DATE OF DISCHARGE:                              HISTORY & PHYSICAL   CHIEF COMPLAINT:  Difficulty walking.   HISTORY OF PRESENT ILLNESS:  A 60 year old male with history of  hypertension, ongoing tobacco abuse presented with complaints of  difficulty walking as patient has been having wide-based gait since  yesterday morning with losing balance along with seeing things double.  Symptoms started after waking up yesterday morning, have been persistent  today and worsening, so he came to the ER.  The patient had a CAT scan  of the head which did not show anything acute.  The patient has been  admitted for further evaluation and management of possible CVA.  The  patient denies any loss of consciousness, any weakness of limbs but did  have decreased grip in both hands.  He also lost some taste sensation  but has no difficulty talking or swallowing.  The patient has passed a  swallowing evaluation.   The patient denies any chest pain, shortness of breath, palpitations,  nausea or vomiting, diarrhea, fever, chills, abdominal pain, dysuria,  discharge or diarrhea.   PAST MEDICAL HISTORY:  Hypertension.   PAST SURGICAL HISTORY:  None.   MEDICATIONS:  Per admission, the patient is on benazepril, dose not  known.   ALLERGIES:  No known drug allergies.   FAMILY HISTORY:  Significant for coronary artery disease and stroke.   SOCIAL HISTORY:  Patient smokes cigarettes, has been strongly advised to  quit smoking, denies any alcohol or drug abuse, lives with his wife.   REVIEW OF SYSTEMS:  As per history of present illness, nothing else  significant.   PHYSICAL EXAMINATION:  GENERAL:  The patient was examined at  bedside,  not in acute distress.  VITAL SIGNS:  Blood pressure is 120/90, pulse 57 per minute, temperature  97.7, respirations 18, O2 saturation 99%.  HEENT:  Anicteric, no pallor, PERRLA positive.  No facial asymmetry.  Tongue is midline.  NECK:  No neck rigidity.  CHEST:  Bilateral air entry present.  No rhonchi, no crepitation.  HEART:  S1 and S2 heard.  ABDOMEN:  Soft, nontender.  Bowel sounds heard.  CNS:  Awake, alert and oriented to time, place and person.  Moves upper  and lower extremities.  Has mild on the left side upper extremity.  On  making the patient walk, there is wide-based gait with slight ataxia.  Patient is able to count fingers at 1 meter.  Both upper and lower  extremities:  Strength is 5/5.  EXTREMITIES:  Peripheral pulses felt.  No edema.   LABORATORIES:  EKG:  Normal sinus rhythm, sinus bradycardia at 44 beats  per minute.  CT of the head without contrast shows normal head CT.  CBC:  WBC is 4.4, hemoglobin 13.6, hematocrit 39.9, platelets 184.  Basic  metabolic panel:  Sodium 138, potassium 3.5, chloride 106, carbon  dioxide 28, glucose 97, BUN 7, creatinine 0.8, calcium 9.1.   ASSESSMENT:  1. Possible cerebrovascular accident.  2. History of hypertension.  3. Ongoing tobacco abuse.   PLAN:  1. We will admit patient to telemetry.  2. Cycle cardiac markers.  Get fasting lipid panel and TSH.  Place      patient on neuro checks.  3. Will get MRI/MRA of the brain, carotid Doppler, transcranial      Doppler and 2-D echocardiogram as a CVA workup.  4. Advised patient to quit smoking and will give tobacco cessation      counseling.  5. Patient has mild bradycardia.  We will have to follow his telemetry      monitoring.  If there is any significant bradycardia or patient is      symptomatic, may need to call cardiology.  6. Patient has history of tuberculosis.  Presently, patient is      completely asymptomatic.  We will get a chest x-ray to see if there       is anything active in the chest x-ray.  7. Further recommendations as condition warrants.      Eduard Clos, MD  Electronically Signed     Eduard Clos, MD  Electronically Signed    ANK/MEDQ  D:  10/03/2008  T:  10/03/2008  Job:  [id number]

## 2010-07-22 NOTE — Discharge Summary (Signed)
[name], FOLZ NO.:  [id number]   MEDICAL RECORD NO.:  [id number]          PATIENT TYPE:  INP   LOCATION:  2027                         FACILITY:  [facility]   PHYSICIAN:  Theodosia Paling, MD    DATE OF BIRTH:  23-Oct-1950   DATE OF ADMISSION:  10/03/2008  DATE OF DISCHARGE:  10/10/2008                               DISCHARGE SUMMARY   PRIMARY CARE PHYSICIAN:  The patient did not have a PCP, was following  with [facility] and now will be following up with Dr. Theotis Barrio, [facility].   ADMITTING HISTORY:  Please refer to the excellent admission note  dictated by Dr. Eduard Clos on October 03, 2008 under history of  present illness.   DISCHARGE DIAGNOSES:  1. Acute stroke of the right thalamus with negative transesophageal      echocardiogram with residual symptoms of ataxia and diplopia.  2. Intermittent sinus tachycardia  of unknown origin.  3. Hyperlipidemia.  4. Hypertension.   DISCHARGE MEDICATIONS:  1. HCTZ 12.5 mg p.o. q. daily.  2. Pravastatin 20 mg p.o. q. daily.  3. Aspirin 325 mg enteric-coated p.o. q. daily  4. Metoprolol 25 mg p.o. q.12 h.   HOSPITAL COURSE:  The following issues were addressed during the  hospitalization.  1. Acute stroke.  The patient came with the symptoms of ataxia and MRI      of the brain revealed acute right thalamic stroke.  The patient was      placed in telemetry which shows intermittent episodes of      tachycardia.  Dr. Lynnea Ferrier from cardiology evaluated the patient.      The patient underwent a stress test which showed no stress-induced      ischemia.  Transesophageal echocardiography was also normal.      Transthoracic echo was also normal.  The patient will be worked up      for pheochromocytoma and Dr. Lynnea Ferrier will follow the patient as an      outpatient for intermittent sinus tachycardia.  The patient is      started on aspirin. Physical therapy evaluation was performed which      recommended  home PT, OT.  The patient is fall-risk and cane is      provided to the patient.  2. Hypertension.  The patient is on HCTZ and metoprolol for      hypertension and metoprolol will also curb intermittent episodes of      supraventricular tachycardia if they arise.  Interestingly, the      patient's heart rate runs in the 50's and then occasionally it goes      up to 120's.  Cardiology will be following the patient as an      outpatient.  3. Hyperlipidemia.  Statin was continued.  Consultation performed, procedure performed, and imaging performed as  mentioned under hospital course.  In addition, a chest x-ray on October 03, 2008 showed severe emphysema biapical scarring, left greater than right,  and mild cardiomegaly.  CT of the brain without contrast done on October 03, 2008 showing normal  head  CT.   DISPOSITION:  1. The patient is going to follow with Dr. Theotis Barrio, [facility] on Wednesday on August 11 at 2:30 p.m. for his acute stroke      followup and comorbid conditions.  2. The patient will follow up with Dr. Lynnea Ferrier, he will be called with      the appointment, for intermittent episodes of sinus tachycardia      with baseline bradycardia in the 50's.  Total time spent in      discharge of this patient around 45 minutes.      Theodosia Paling, MD  Electronically Signed     NP/MEDQ  D:  10/10/2008  T:  10/10/2008  Job:  [id number]   cc:   Ritta Slot, MD  Brooks Sailors, MD
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
## 2010-07-25 NOTE — Discharge Summary (Signed)
NAME:  [name], [name]                       ACCOUNT NO.:  [id number]   MEDICAL RECORD NO.:  [id number]                   PATIENT TYPE:  IPS   LOCATION:  0506                                 FACILITY:  [facility]   PHYSICIAN:  Geoffery Lyons, M.D.                   DATE OF BIRTH:  June 22, 1950   DATE OF ADMISSION:  01/12/2003  DATE OF DISCHARGE:  01/22/2003                                 DISCHARGE SUMMARY   CHIEF COMPLAINT AND PRESENT ILLNESS:  This was the second admission to [facility] for this 60 year old black single male who presented  to the admissions office claiming that he stopped the medication one month  prior to this admission.  He was admitted to be seen in August of 2004.  He  lives alone.  Claimed that he was not bathing, eating or sleeping.  Depressed because his wife left him four months ago.  Continues to use  cocaine.  He was complaining of suicidal and homicidal ideation.  Claimed  that he had stopped going to work.  Lost at least 40 pounds.  Full lab  workup with no indication of abnormalities.  Having trouble dealing with the  separation.  Multiple traumas.  Does acknowledge that he was very tore up  about the fact that his wife left him and could not be accountable if he  goes and kills her.   PAST PSYCHIATRIC HISTORY:  Admitted to [facility] in August of 2004.  At that time, depressed, cocaine abuse.  Discharged on Lexapro 10 mg in the  morning, Symmetrel 100 mg twice a day and Seroquel 200 mg at bedtime.   SUBSTANCE ABUSE HISTORY:  Crack for years and continues to use sporadically.   PAST MEDICAL HISTORY:  Weight loss of 40 pounds.   PHYSICAL EXAMINATION:  Performed and failed to show any acute findings.   MENTAL STATUS EXAM:  Alert, cooperative male, well-dressed.  Speech is  normal rate, rhythm and tone.  Mood irritable, depressed.  Affect broad.  Thought processes are clear and rational.  Judgment and insight are fair.  Thoughts deal  with the events that led to be admitted, the losses in his  life, the anger because of the wife's decision to separate.  Denies that he  had ideas to hurt her but claims he would not know what to do if he was to  see her with someone else.  He denied any suicidal ideation.  Cognition well-  preserved.   ADMISSION DIAGNOSES:   AXIS I:  1. Major depression, recurrent.  2. Cocaine dependence.   AXIS II:  No diagnosis.   AXIS III:  No diagnosis.   AXIS IV:  Moderate.   AXIS V:  Global Assessment of Functioning upon admission 25; highest Global  Assessment of Functioning in the last year 80.   LABORATORY DATA:  CBC with hemoglobin 12.2.  Blood chemistry within  normal  limits.  Potassium 3.2, glucose 106.  Liver profile within normal limits.   HOSPITAL COURSE:  He was admitted and started intensive individual and group  psychotherapy.  He was maintained on the Lexapro 5 mg and then he was given  Ambien 10 mg at bedtime for sleep.  He was given Neurontin that was  increased up to 200 mg four times a day and then 300 mg four times a day.  He was switched to Wellbutrin as he felt that the Lexapro did not help him  any and was endorsing like a lot of depression with no energy, no  motivation, had difficulty focusing.  Continued to ruminate about his wife.  He was started on Risperdal 0.5 mg twice a day.  The Lexapro was  discontinued.  Risperdal was increased to 0.5 mg twice a day and at bedtime.  He continued to be very upset with the situation with the wife, loss of back  ________ having to do with losses in his life, neglect starting when he was  left by his mother.  Continued to endorse that, although he would not hurt  his wife purposely, he could not anticipate what he was going to do if he  saw her with another man.  Continued to evidence a lot of affect and  depression, tearful especially when talking about her.  Continued to grieve  the loss of the wife.  We held intensive  individual sessions, the case  manager as well as he was allowed to visit with his pastor.  He was able to  also deal with the situation in group.  He went to a loss group that was  very effective.  He was able to talk to the chaplin and the wife.  We asked  the wife to come for a family session but she pretty much said that  everything was already said.  She was aware of his feelings and the possible  threats.  The patient eventually changing the focus into focusing on  himself, building himself back up.  He was allegedly a very spiritual  person, wanted to pursue ministry.  He was going to go to school for it.  On  January 22, 2003, he was basically baseline.  He denied any suicidal or  homicidal ideation.  He felt he had enough support system, enough  activities, enough things to look forward to, that will take away the  thinking from his wife.  He was planning to move on.  His mood, at that  particular time, was euthymic, was feeling better.  Had a plan to keep  himself focused.  Again, endorsed no ideas to hurt himself or his wife or  any other people.  Upon discharge, he stated that he was committed to do  well.  He said I am not going to do anything to hurt my wife, Doc.  As he  felt he had obtained full benefit from this hospitalization, we discharged  to outpatient follow-up.   DISCHARGE DIAGNOSES:   AXIS I:  1. Major depression, recurrent.  2. Cocaine dependence.   AXIS II:  No diagnosis.   AXIS III:  No diagnosis.   AXIS IV:  Moderate.   AXIS V:  Global Assessment of Functioning upon discharge 55-60.   DISCHARGE MEDICATIONS:  1. Wellbutrin XL 150 mg in the morning.  2. Risperdal 0.5 mg, 1 twice a day and 2 at night.  3. Neurontin 300 mg four times a day.  4. Ambien 10 mg at bedtime for sleep.   FOLLOW UP:  [facility].                                               Geoffery Lyons, M.D.   IL/MEDQ  D:  02/14/2003  T:  02/14/2003  Job:   [id number]

## 2010-07-25 NOTE — Discharge Summary (Signed)
NAME:  [name], [name]                       ACCOUNT NO.:  [id number]   MEDICAL RECORD NO.:  [id number]                   PATIENT TYPE:  IPS   LOCATION:  0305                                 FACILITY:  [facility]   PHYSICIAN:  Jeanice Lim, M.D.              DATE OF BIRTH:  December 17, 1950   DATE OF ADMISSION:  11/06/2002  DATE OF DISCHARGE:  11/17/2002                                 DISCHARGE SUMMARY   HISTORY OF PRESENT ILLNESS:  This is a 60 year old separated Caucasian male  voluntarily admitted with a history of depression and feeling stressed, and  not eating for the past two weeks with difficulty concentrating and  difficulty functioning, history of cocaine use.  Last use was three months  ago.  He is having difficulty with mother's death and family reports that  the patient has allegedly been verbally abusive and quite irritable.   MEDICATIONS:  None.   ALLERGIES:  No known drug allergies.   HISTORY OF PRESENT ILLNESS:  Essentially within normal limits.  Neurologically nonfocal.  Mild positive CVA tenderness.  Some nystagmus.  Otherwise physical exam and neurological exam were essentially unremarkable.   LABORATORY DATA:  Her drug screen was positive for cocaine.  Hemoglobin and  hematocrit were 12.4 and 34.9, respectively.  Potassium was slightly low at  3.4.  TSH was 0.756.   MENTAL STATUS EXAMINATION:  Alert, middle age, cooperative male with fair  eye contact.  Speech rambling.  Mood depressed.  Affect flat.  Thought  process positive for passive suicidal ideation.  Positive history of  auditory hallucinations.  Denies any acute hallucinations.  Cognitive  intact.  Judgment and insight poor, somewhat vague and questionable  historian.   ADMITTING DIAGNOSES:   AXIS I:  1. Depressive disorder, not otherwise specified.  2. Cocaine abuse.   AXIS II:  Deferred.   AXIS III:  None.   AXIS IV:  Moderate problems with primary support group and other  psychosocial  stressors.   AXIS IV:  Global assessment of functioning 30/55-60.   HOSPITAL COURSE:  The patient was admitted and ordered routine p.r.n.  medications.  He underwent further monitoring.  He was encouraged to  participator in individual and group therapy.  Trazodone was ordered p.r.n.  for sleep and nicotine to prevent withdrawal symptoms.  Lexapro was started  targeting depressive symptoms and optimized and Symmetrel ordered for  cocaine cravings.  Trazodone was not effective.  Seroquel was then started  to assist with restoring sleep cycle and stabilize mood.  The patient  reported positive response to clinical intervention and medication changes  with no side effects.  The risks, benefits regarding various treatments with  medications were reviewed with the patient, and the patient was comfortable  with medication regimen.   DISCHARGE CONDITION:  Markedly improved alert and oriented.  Mood more  euthymic, affect brighter. Thought process goal directed.  Thought content  negative for  dangerous ideations or psychotic symptoms.   DISCHARGE MEDICATIONS:  1. Lexapro 10 mg q.a.m.  2. Symmetrel 100 mg b.i.d.  3. Seroquel 200 mg q.h.s.   FOLLOW UP:  To follow up [facility] November 23, 2002, at 1:30.   DISCHARGE DIAGNOSES:   AXIS I:  1. Depressive disorder, not otherwise specified.  2. Cocaine abuse.   AXIS II:  Deferred.   AXIS III:  None.   AXIS IV:  Moderate problems with primary support group and other  psychosocial stressors.   AXIS IV:  Global assessment of functioning on discharge is 55.                                               Jeanice Lim, M.D.    JEM/MEDQ  D:  12/13/2002  T:  12/13/2002  Job:  [id number]

## 2010-07-31 ENCOUNTER — Ambulatory Visit (INDEPENDENT_AMBULATORY_CARE_PROVIDER_SITE_OTHER): Payer: Self-pay | Admitting: Internal Medicine

## 2010-07-31 DIAGNOSIS — I10 Essential (primary) hypertension: Secondary | ICD-10-CM

## 2010-07-31 DIAGNOSIS — Z9189 Other specified personal risk factors, not elsewhere classified: Secondary | ICD-10-CM

## 2010-07-31 MED ORDER — AMLODIPINE BESYLATE 10 MG PO TABS: 10.0000 mg | ORAL_TABLET | Freq: Every day | ORAL | Status: DC

## 2010-07-31 NOTE — Patient Instructions (Addendum)
Hypertension (High Blood Pressure) As your heart beats, it forces blood through your arteries. This force is your blood pressure. If the pressure is too high, it is called hypertension (HTN) or high blood pressure. HTN is dangerous because you may have it and not know it. High blood pressure may mean that your heart has to work harder to pump blood. Your arteries may be narrow or stiff. The extra work puts you at risk for heart disease, stroke, and other problems.  Blood pressure consists of two numbers, a higher number over a lower, 110/72, for example. It is stated as "110 over 72." The ideal is below 120 for the top number (systolic) and under 80 for the bottom (diastolic). Write down your blood pressure today. You should pay close attention to your blood pressure if you have certain conditions such as:  Heart failure.  Prior heart attack.   Diabetes   Chronic kidney disease.   Prior stroke.   Multiple risk factors for heart disease.   To see if you have HTN, your blood pressure should be measured while you are seated with your arm held at the level of the heart. It should be measured at least twice. A one-time elevated blood pressure reading (especially in the Emergency Department) does not mean that you need treatment. There may be conditions in which the blood pressure is different between your right and left arms. It is important to see your caregiver soon for a recheck. Most people have essential hypertension which means that there is not a specific cause. This type of high blood pressure may be lowered by changing lifestyle factors such as:  Stress.  Smoking.   Lack of exercise.   Excessive weight.  Drug/tobacco/alcohol use.   Eating less salt.   Most people do not have symptoms from high blood pressure until it has caused damage to the body. Effective treatment can often prevent, delay or reduce that damage. TREATMENT Treatment for high blood pressure, when a cause has been  identified, is directed at the cause. There are a large number of medications to treat HTN. These fall into several categories, and your caregiver will help you select the medicines that are best for you. Medications may have side effects. You should review side effects with your caregiver. If your blood pressure stays high after you have made lifestyle changes or started on medicines,   Your medication(s) may need to be changed.   Other problems may need to be addressed.   Be certain you understand your prescriptions, and know how and when to take your medicine.   Be sure to follow up with your caregiver within the time frame advised (usually within two weeks) to have your blood pressure rechecked and to review your medications.   If you are taking more than one medicine to lower your blood pressure, make sure you know how and at what times they should be taken. Taking two medicines at the same time can result in blood pressure that is too low.  SEEK IMMEDIATE MEDICAL CARE IF YOU DEVELOP:  A severe headache, blurred or changing vision, or confusion.   Unusual weakness or numbness, or a faint feeling.   Severe chest or abdominal pain, vomiting, or breathing problems.  MAKE SURE YOU:   Understand these instructions.   Will watch your condition.   Will get help right away if you are not doing well or get worse.  Document Released: 02/23/2005 Document Re-Released: 08/13/2009 [employer] Patient Information 2011 [employer].  1.  Start Norvasc 10 mg daily for your blood pressure  2. We will get you set up for the sleep study with the CPAP titration.  3. Follow up in 2 weeks for a recheck of your blood pressure.

## 2010-07-31 NOTE — Progress Notes (Signed)
  Subjective:    Patient ID: [name], male    DOB: 1950/04/24, 60 y.o.   MRN: [id number]  HPI  Mr. Orner is a 60 year old man who presents today for follow from his last visit.  He was started on Lisinopril/HCTZ at his last visit.  He states that he has been getting the medication and has been taking it.  He denies any side effects from the medications.  He also denies any headaches, blurry vision, chest pain, or abdominal pain from his continued elevated blood pressure.  He does have a history of snoring and states that when he wakes up he is still as tired as when he went to bed.  He states that his wife has mentioned that he snores and sometimes stops breathing. He has not had a sleep study in the past.    Review of Systems    Constitutional: Positive for fatigue. Denies fever, chills, diaphoresis, appetite change.  HEENT: Denies photophobia, eye pain, redness, hearing loss, ear pain, congestion, sore throat, rhinorrhea, sneezing, mouth sores, trouble swallowing, neck pain, neck stiffness and tinnitus.   Respiratory: Denies SOB, DOE, cough, chest tightness,  and wheezing.   Cardiovascular: Denies chest pain, palpitations and leg swelling.  Gastrointestinal: Denies nausea, vomiting, abdominal pain, diarrhea, constipation, blood in stool and abdominal distention.  Genitourinary: Denies dysuria, urgency, frequency, hematuria, flank pain and difficulty urinating.  Musculoskeletal: Denies myalgias, back pain, joint swelling, arthralgias and gait problem.  Skin: Denies pallor, rash and wound.  Neurological: Denies dizziness, seizures, syncope, weakness, light-headedness, numbness and headaches.  Hematological: Denies adenopathy. Easy bruising, personal or family bleeding history  Psychiatric/Behavioral: Denies suicidal ideation, mood changes, confusion, nervousness, sleep disturbance and agitation   Objective:   Physical Exam    Constitutional: Vital signs reviewed.  Patient is a  well-developed and well-nourished man in no acute distress and cooperative with exam. Alert and oriented x3.  Head: Normocephalic and atraumatic Ear: TM normal bilaterally Mouth: no erythema or exudates, MMM Eyes: PERRL, EOMI, conjunctivae normal, No scleral icterus.  Neck: Supple, Trachea midline normal ROM, No JVD, mass, thyromegaly, or carotid bruit present.  Cardiovascular: RRR, S1 normal, S2 normal, no MRG, pulses symmetric and intact bilaterally Pulmonary/Chest: CTAB, no wheezes, rales, or rhonchi Abdominal: Soft. Non-tender, non-distended, bowel sounds are normal, no masses, organomegaly, or guarding present.  GU: no CVA tenderness Musculoskeletal: No joint deformities, erythema, or stiffness, ROM full and no nontender Hematology: no cervical, inginal, or axillary adenopathy.  Neurological: A&O x3, Strength is normal and symmetric bilaterally, cranial nerve II-XII are grossly intact, no focal motor deficit, sensory intact to light touch bilaterally.  Skin: Warm, dry and intact. No rash, cyanosis, or clubbing.  Psychiatric: Normal mood and affect. speech and behavior is normal. Judgment and thought content normal. Cognition and memory are normal.    Assessment & Plan:

## 2010-08-05 NOTE — Assessment & Plan Note (Signed)
BP Readings from Last 3 Encounters:  07/17/10 176/118  07/01/10 158/112  06/24/10 153/86   Blood pressure is elevated today still.  He states that he is taking his medications and continues to be high.  We will add Norvasc  10 mg to his regimen and have him follow up again in the next 2 weeks to recheck his blood pressure.  We will also evaluate him for sleep apnea as that can contribute to hypertension as well.

## 2010-08-05 NOTE — Assessment & Plan Note (Signed)
His symptoms of snoring with daytime sleepiness could be due to OSA.  We will get him set for a split sleep study with CPAP titration to see if we can get better control of his blood pressure as well as to help with his fatigue.

## 2010-08-14 ENCOUNTER — Ambulatory Visit (INDEPENDENT_AMBULATORY_CARE_PROVIDER_SITE_OTHER): Payer: Self-pay | Admitting: Internal Medicine

## 2010-08-14 ENCOUNTER — Encounter: Payer: Self-pay | Admitting: Internal Medicine

## 2010-08-14 DIAGNOSIS — I498 Other specified cardiac arrhythmias: Secondary | ICD-10-CM

## 2010-08-14 DIAGNOSIS — R319 Hematuria, unspecified: Secondary | ICD-10-CM | POA: Insufficient documentation

## 2010-08-14 DIAGNOSIS — I10 Essential (primary) hypertension: Secondary | ICD-10-CM

## 2010-08-14 DIAGNOSIS — E785 Hyperlipidemia, unspecified: Secondary | ICD-10-CM

## 2010-08-14 LAB — CBC WITH DIFFERENTIAL/PLATELET
Eosinophils Relative: 4 % (ref 0–5)
Lymphocytes Relative: 39 % (ref 12–46)
Lymphs Abs: 1.8 10*3/uL (ref 0.7–4.0)
MCV: 83.5 fL (ref 78.0–100.0)
Neutro Abs: 2.3 10*3/uL (ref 1.7–7.7)
Neutrophils Relative %: 50 % (ref 43–77)
Platelets: 198 10*3/uL (ref 150–400)
RBC: 4.85 MIL/uL (ref 4.22–5.81)
WBC: 4.5 10*3/uL (ref 4.0–10.5)

## 2010-08-14 MED ORDER — MULTI-VITAMIN/MINERALS PO TABS: 1.0000 | ORAL_TABLET | Freq: Every day | ORAL | Status: DC

## 2010-08-14 NOTE — Assessment & Plan Note (Addendum)
He has great control now, compared to last few visits. He reports that he had not been taking lisinopril/hctz when he last saw Dr. Tonny Branch. Now he is taking it but not taking norvasc. Since his BP is controlled I would continue same regimen of lisinopril/hctz without norvasc.

## 2010-08-14 NOTE — Progress Notes (Signed)
  Subjective:    Patient ID: [name], male    DOB: 1950/09/20, 60 y.o.   MRN: [id number]  HPI   [name] is a 60 year old man who presents today for follow from his last visit.  He was started on Lisinopril/HCTZ along with norvasc at his last visit.  He states that he has been getting the medication and has been taking it.  He denies any side effects from the medications.  He also denies any headaches, blurry vision, chest pain, or abdominal pain from his continued elevated blood pressure.  He does have a history of snoring and states that when he wakes up he is still as tired as when he went to bed.  He states that his wife has mentioned that he snores and sometimes stops breathing. He has not had a sleep study in the past.   He also reports cola colored urine time to time. He also informs me that he has sickle cell trait. He feels fatigued all day  Review of Systems     Constitutional: Positive for fatigue. Denies fever, chills, diaphoresis, appetite change.  HEENT: Denies photophobia, eye pain, redness, hearing loss, ear pain, congestion, sore throat, rhinorrhea, sneezing, mouth sores, trouble swallowing, neck pain, neck stiffness and tinnitus.   Respiratory: Denies SOB, DOE, cough, chest tightness,  and wheezing.   Cardiovascular: Denies chest pain, palpitations and leg swelling.  Gastrointestinal: Denies nausea, vomiting, abdominal pain, diarrhea, constipation, blood in stool and abdominal distention.  Genitourinary: Denies dysuria, urgency, frequency, hematuria, flank pain and difficulty urinating.  Musculoskeletal: Denies myalgias, back pain, joint swelling, arthralgias and gait problem.  Skin: Denies pallor, rash and wound.  Neurological: Denies dizziness, seizures, syncope, weakness, light-headedness, numbness and headaches.  Hematological: Denies adenopathy. Easy bruising, personal or family bleeding history  Psychiatric/Behavioral: Denies suicidal ideation, mood changes,  confusion, nervousness, sleep disturbance and agitation   Objective:   Physical Exam     Constitutional: Vital signs reviewed.  Patient is a well-developed and well-nourished man in no acute distress and cooperative with exam. Alert and oriented x3.  Head: Normocephalic and atraumatic Ear: TM normal bilaterally Mouth: no erythema or exudates, MMM Eyes: PERRL, EOMI, conjunctivae normal, No scleral icterus.  Neck: Supple, Trachea midline normal ROM, No JVD, mass, thyromegaly, or carotid bruit present.  Cardiovascular: RRR, S1 normal, S2 normal, no MRG, pulses symmetric and intact bilaterally Pulmonary/Chest: CTAB, no wheezes, rales, or rhonchi Abdominal: Soft. Non-tender, non-distended, bowel sounds are normal, no masses, organomegaly, or guarding present.  GU: no CVA tenderness Musculoskeletal: No joint deformities, erythema, or stiffness, ROM full and no nontender Hematology: no cervical, inginal, or axillary adenopathy.  Neurological: A&O x3, Strength is normal and symmetric bilaterally, cranial nerve II-XII are grossly intact, no focal motor deficit, sensory intact to light touch bilaterally.  Skin: Warm, dry and intact. No rash, cyanosis, or clubbing.  Psychiatric: Normal mood and affect. speech and behavior is normal. Judgment and thought content normal. Cognition and memory are normal.    Assessment & Plan:

## 2010-08-14 NOTE — Assessment & Plan Note (Signed)
Has complained of this in the past. Could be related to sickle cell trait. If not, may need further urology workup.

## 2010-08-14 NOTE — Patient Instructions (Signed)
Drink plenty of water Continue current medicaitons Doctor will ONLY call you with results if any thing is concerning.

## 2010-08-14 NOTE — Assessment & Plan Note (Signed)
Filed Vitals:   08/14/10 0952  BP: 125/86  Pulse: 80  Temp: 97 F (36.1 C)  HR >60, no symoptoms

## 2010-08-15 LAB — BASIC METABOLIC PANEL WITH GFR
CO2: 28 mEq/L (ref 19–32)
Chloride: 102 mEq/L (ref 96–112)
GFR, Est Non African American: >60 mL/min (ref >60)
Potassium: 4.3 mEq/L (ref 3.5–5.3)
Sodium: 140 mEq/L (ref 135–145)

## 2010-08-15 LAB — URINALYSIS
Bilirubin Urine: NEGATIVE
Specific Gravity, Urine: 1.019 (ref 1.005–1.030)
Urobilinogen, UA: 1 mg/dL (ref 0.0–1.0)

## 2010-08-18 LAB — HEMOGLOBINOPATHY EVALUATION
Hemoglobin Other: 0 % (ref 0.0–0.0)
Hgb A2 Quant: 2.7 % (ref 2.2–3.2)
Hgb A: 57.4 % — ABNORMAL LOW (ref 96.8–97.8)
Hgb F Quant: 0 % (ref 0.0–2.0)
Hgb S Quant: 39.9 % — ABNORMAL HIGH (ref 0.0–0.0)

## 2010-08-21 ENCOUNTER — Other Ambulatory Visit: Payer: Self-pay | Admitting: Internal Medicine

## 2010-08-21 NOTE — Progress Notes (Signed)
Addended by: Bufford Spikes on: 08/21/2010 10:12 AM   Modules accepted: Orders

## 2010-09-01 ENCOUNTER — Other Ambulatory Visit: Payer: Self-pay

## 2010-09-01 DIAGNOSIS — Z1211 Encounter for screening for malignant neoplasm of colon: Secondary | ICD-10-CM

## 2010-09-01 LAB — POC HEMOCCULT BLD/STL (HOME/3-CARD/SCREEN): Card #3 Fecal Occult Blood, POC: NEGATIVE

## 2010-09-02 ENCOUNTER — Telehealth: Payer: Self-pay | Admitting: *Deleted

## 2010-09-02 ENCOUNTER — Ambulatory Visit (HOSPITAL_BASED_OUTPATIENT_CLINIC_OR_DEPARTMENT_OTHER): Payer: Self-pay | Attending: Internal Medicine

## 2010-09-02 ENCOUNTER — Encounter: Payer: Self-pay | Admitting: Internal Medicine

## 2010-09-02 ENCOUNTER — Ambulatory Visit (INDEPENDENT_AMBULATORY_CARE_PROVIDER_SITE_OTHER): Payer: Self-pay | Admitting: Internal Medicine

## 2010-09-02 DIAGNOSIS — G4710 Hypersomnia, unspecified: Secondary | ICD-10-CM | POA: Insufficient documentation

## 2010-09-02 DIAGNOSIS — M549 Dorsalgia, unspecified: Secondary | ICD-10-CM

## 2010-09-02 DIAGNOSIS — R319 Hematuria, unspecified: Secondary | ICD-10-CM

## 2010-09-02 DIAGNOSIS — I10 Essential (primary) hypertension: Secondary | ICD-10-CM

## 2010-09-02 DIAGNOSIS — G4730 Sleep apnea, unspecified: Secondary | ICD-10-CM | POA: Insufficient documentation

## 2010-09-02 DIAGNOSIS — Z79899 Other long term (current) drug therapy: Secondary | ICD-10-CM | POA: Insufficient documentation

## 2010-09-02 MED ORDER — MELOXICAM 7.5 MG PO TABS: 7.5000 mg | ORAL_TABLET | Freq: Every day | ORAL | Status: DC

## 2010-09-02 NOTE — Telephone Encounter (Signed)
Will see today at 2:45 

## 2010-09-02 NOTE — Telephone Encounter (Signed)
Pt called with c/o pain around waist, from side to side. Increase pain with deep breathing.  Onset 5 days ago.  Movement seems to help.   Has not taken any meds.   No known injury.  No problem with urination. Pt thinks it may be a side effect from lisinopril/hctz.. Hx back pain

## 2010-09-02 NOTE — Patient Instructions (Signed)
Return as needed.  Take tylenol as needed 1-2 tablets a day.  Take new prescribed medicine if pain is worse.

## 2010-09-03 NOTE — Assessment & Plan Note (Signed)
Well controlled. No changes. 

## 2010-09-03 NOTE — Progress Notes (Signed)
  Subjective:    Patient ID: [name], male    DOB: 06/08/1950, 60 y.o.   MRN: [id number]  Back Pain    [name] is a 60 year old man who presents today for follow from his last visit.  He was started on maloxicam for his back pain and it helped him a lot. But then he had no refill so he could not get anything from pharmacy. Since then his back pain and pain around the pelvic area has returned. He reports that he hurts every time he takes deep breath. It has no relation ship with food intake, urination or bowel movement. There is no other aggravating or relieving factor known (except maloxicam, and he has not tried any other pain killer).  He has no other complain.    Review of Systems  Musculoskeletal: Positive for back pain.      Constitutional: Positive for fatigue. Denies fever, chills, diaphoresis, appetite change.  HEENT: Denies photophobia, eye pain, redness, hearing loss, ear pain, congestion, sore throat, rhinorrhea, sneezing, mouth sores, trouble swallowing, neck pain, neck stiffness and tinnitus.   Respiratory: Denies SOB, DOE, cough, chest tightness,  and wheezing.   Cardiovascular: Denies chest pain, palpitations and leg swelling.  Gastrointestinal: Denies nausea, vomiting, abdominal pain, diarrhea, constipation, blood in stool and abdominal distention.  Genitourinary: Denies dysuria, urgency, frequency, hematuria, flank pain and difficulty urinating.  Musculoskeletal: Denies myalgias, back pain, joint swelling, arthralgias and gait problem.  Skin: Denies pallor, rash and wound.  Neurological: Denies dizziness, seizures, syncope, weakness, light-headedness, numbness and headaches.  Hematological: Denies adenopathy. Easy bruising, personal or family bleeding history  Psychiatric/Behavioral: Denies suicidal ideation, mood changes, confusion, nervousness, sleep disturbance and agitation   Objective:   Physical Exam     Constitutional: Vital signs reviewed.  Patient  is a well-developed and well-nourished man in no acute distress and cooperative with exam. Alert and oriented x3.  Head: Normocephalic and atraumatic Ear: TM normal bilaterally Mouth: no erythema or exudates, MMM Eyes: PERRL, EOMI, conjunctivae normal, No scleral icterus.  Neck: Supple, Trachea midline normal ROM, No JVD, mass, thyromegaly, or carotid bruit present.  Cardiovascular: RRR, S1 normal, S2 normal, no MRG, pulses symmetric and intact bilaterally Pulmonary/Chest: CTAB, no wheezes, rales, or rhonchi Abdominal: Soft. Non-tender, non-distended, bowel sounds are normal, no masses, organomegaly, or guarding present.  GU: no CVA tenderness Musculoskeletal: No joint deformities, erythema, or stiffness, ROM full and no nontender Hematology: no cervical, inginal, or axillary adenopathy.  Neurological: A&O x3, Strength is normal and symmetric bilaterally, cranial nerve II-XII are grossly intact, no focal motor deficit, sensory intact to light touch bilaterally.  Skin: Warm, dry and intact. No rash, cyanosis, or clubbing.  Psychiatric: Normal mood and affect. speech and behavior is normal. Judgment and thought content normal. Cognition and memory are normal.    Assessment & Plan:

## 2010-09-03 NOTE — Assessment & Plan Note (Signed)
Confirmed sickle cell trait. May need further workup if hematuria persists long term and is not episodic.

## 2010-09-03 NOTE — Assessment & Plan Note (Signed)
Refilled maloxicam at lower dose for now with renal concerns with hematuria. Return to pcp for further evaluation of imaging study if pain persists.

## 2010-09-06 DIAGNOSIS — Z79899 Other long term (current) drug therapy: Secondary | ICD-10-CM

## 2010-09-06 DIAGNOSIS — G4710 Hypersomnia, unspecified: Secondary | ICD-10-CM

## 2010-09-06 DIAGNOSIS — G4730 Sleep apnea, unspecified: Secondary | ICD-10-CM

## 2010-09-06 NOTE — Procedures (Signed)
NAMEAIRON, [name]             ACCOUNT NO.:  [id number]  MEDICAL RECORD NO.:  [id number]          PATIENT TYPE:  OUT  LOCATION:  SLEEP CENTER                 FACILITY:  [facility]  PHYSICIAN:  Clinton D. Maple Hudson, MD, FCCP, FACPDATE OF BIRTH:  03-Dec-1950  DATE OF STUDY:  09/02/2010                           NOCTURNAL POLYSOMNOGRAM  REFERRING PHYSICIAN:  Leodis Sias, MD  INDICATION FOR STUDY:  Hypersomnia with sleep apnea.  EPWORTH SLEEPINESS SCORE:  11/24.  BMI 21.1.  Weight 160 pounds.  Height 73 inches.  Neck 13 inches.  MEDICATIONS:  Home medications are charted and reviewed.  SLEEP ARCHITECTURE:  Total sleep time 179.5 minutes with sleep efficiency 49.4%.  Stage I was 10%, stage II 64.9%, stage III absent, REM 25.1% of total sleep time.  Sleep latency 46 minutes, REM latency 133 minutes, awake after sleep onset 138.5 minutes, arousal index 5.3.  BEDTIME MEDICATIONS:  Simvastatin, lisinopril/hydrochlorothiazide, metoprolol, also propranolol (??), and aspirin were taken at 10 p.m.  RESPIRATORY DATA:  Apnea/hypopnea index (AHI) 3.7 per hour.  A total of 11 events were scored including 4 obstructive apneas, 7 hypopneas.  All events were recorded during supine sleep.  REM AHI 12 per hour.  RDI 5 per hour.  There were insufficient events to permit application of CPAP titration by split protocol requirements on the study night.  OXYGEN DATA:  Snoring was absent to occasionally moderate with oxygen desaturation to a nadir of 91% and a mean oxygen saturation through the study of 97.1% on room air.  CARDIAC DATA:  Sinus rhythm with intervals of paced rhythm and occasional PACs.  Heart rate was around 58-60 per minute.  MOVEMENT-PARASOMNIA:  No significant movement disturbance.  No bathroom trips.  IMPRESSIONS-RECOMMENDATIONS: 1. Occasional respiratory event with sleep disturbance, within normal     limits, AHI 3.7 per hour (normal range, 0-5 per hour).  Events were  associated with supine sleep position and REM.  Snoring was absent     to occasionally moderate with oxygen desaturation to a nadir of 91%     and a mean oxygen saturation through the study of 97.1% on room     air. 2. There were insufficient numbers of events to meet requirements for     application of CPAP split protocol titration.  Since events seemed     positional, there may be benefit to encouraging sleep off flat with     back.     Clinton D. Maple Hudson, MD, [facility], FACP Diplomate, Biomedical engineer of Sleep Medicine Electronically Signed    CDY/MEDQ  D:  09/06/2010 10:19:32  T:  09/06/2010 20:18:30  Job:  [id number]

## 2010-09-14 ENCOUNTER — Encounter (HOSPITAL_BASED_OUTPATIENT_CLINIC_OR_DEPARTMENT_OTHER): Payer: Self-pay

## 2010-12-08 LAB — POCT URINALYSIS DIP (DEVICE)
Bilirubin Urine: NEGATIVE
Glucose, UA: NEGATIVE
Ketones, ur: NEGATIVE
Nitrite: NEGATIVE
pH: 6.5

## 2010-12-08 LAB — DIFFERENTIAL
Basophils Absolute: 0
Basophils Relative: 0
Lymphocytes Relative: 20
Monocytes Absolute: 0.5
Monocytes Relative: 8
Neutro Abs: 3.9
Neutrophils Relative %: 69

## 2010-12-08 LAB — CBC
Hemoglobin: 13.3
RBC: 4.51
RDW: 14.7

## 2010-12-08 LAB — POCT I-STAT, CHEM 8
BUN: 11
Calcium, Ion: 1.22
Creatinine, Ser: 1.1
TCO2: 30

## 2010-12-08 LAB — POCT H PYLORI SCREEN: H. PYLORI SCREEN, POC: POSITIVE — AB

## 2011-01-08 ENCOUNTER — Encounter: Payer: Self-pay | Admitting: Internal Medicine

## 2011-01-13 ENCOUNTER — Encounter: Payer: Self-pay | Admitting: Internal Medicine

## 2011-01-13 ENCOUNTER — Ambulatory Visit (INDEPENDENT_AMBULATORY_CARE_PROVIDER_SITE_OTHER): Payer: Self-pay | Admitting: Internal Medicine

## 2011-01-13 VITALS — BP 146/89 | HR 80 | Temp 97.3°F | Resp 20 | Ht 73.0 in | Wt 162.5 lb

## 2011-01-13 DIAGNOSIS — Z23 Encounter for immunization: Secondary | ICD-10-CM

## 2011-01-13 DIAGNOSIS — E785 Hyperlipidemia, unspecified: Secondary | ICD-10-CM

## 2011-01-13 DIAGNOSIS — F17200 Nicotine dependence, unspecified, uncomplicated: Secondary | ICD-10-CM

## 2011-01-13 DIAGNOSIS — Z299 Encounter for prophylactic measures, unspecified: Secondary | ICD-10-CM | POA: Insufficient documentation

## 2011-01-13 DIAGNOSIS — N529 Male erectile dysfunction, unspecified: Secondary | ICD-10-CM

## 2011-01-13 DIAGNOSIS — I10 Essential (primary) hypertension: Secondary | ICD-10-CM

## 2011-01-13 MED ORDER — VARENICLINE TARTRATE 1 MG PO TABS: 1.0000 mg | ORAL_TABLET | Freq: Two times a day (BID) | ORAL | Status: DC

## 2011-01-13 MED ORDER — PRAVASTATIN SODIUM 20 MG PO TABS: 20.0000 mg | ORAL_TABLET | Freq: Every day | ORAL | Status: DC

## 2011-01-13 MED ORDER — SILDENAFIL CITRATE 100 MG PO TABS: 100.0000 mg | ORAL_TABLET | ORAL | Status: DC | PRN

## 2011-01-13 MED ORDER — ASPIRIN 325 MG PO TABS: 325.0000 mg | ORAL_TABLET | Freq: Every day | ORAL | Status: DC

## 2011-01-13 MED ORDER — LISINOPRIL-HYDROCHLOROTHIAZIDE 20-25 MG PO TABS: 1.0000 | ORAL_TABLET | Freq: Every day | ORAL | Status: DC

## 2011-01-13 NOTE — Assessment & Plan Note (Signed)
Restart blood pressure medications and followup in 2 weeks.

## 2011-01-13 NOTE — Progress Notes (Signed)
  Subjective:    Patient ID: [name], male    DOB: 1951/03/04, 60 y.o.   MRN: [id number]  HPI [name] is a 60 year old man with past medical history as mentioned in the note.  He started here today for regular followup.  Patient is not taking any of his medications at this point of time and requests a refill on all his medications. He states that he had moved recently from his old house and lost all his prescriptions.  Patient's blood pressure is grade 1 hypertension. He states that he is taking care of his diet and also exercising a lot.  Patient is still smoking about 3-4 cigarettes a day and is ready to quit.  Complains of occasional headaches which are relieved with ibuprofen.  Patient is due for a shot.     Review of Systems  Constitutional: Negative for fever, activity change and appetite change.  HENT: Negative for sore throat.   Respiratory: Negative for cough and shortness of breath.   Cardiovascular: Negative for chest pain and leg swelling.  Gastrointestinal: Negative for nausea, abdominal pain, diarrhea, constipation and abdominal distention.  Genitourinary: Negative for frequency, hematuria and difficulty urinating.  Neurological: Negative for dizziness and headaches.  Psychiatric/Behavioral: Negative for suicidal ideas and behavioral problems.       Objective:   Physical Exam  Constitutional: He is oriented to person, place, and time. He appears well-developed and well-nourished.  HENT:  Head: Normocephalic and atraumatic.  Eyes: Conjunctivae and EOM are normal. Pupils are equal, round, and reactive to light. No scleral icterus.  Neck: Normal range of motion. Neck supple. No JVD present. No thyromegaly present.  Cardiovascular: Normal rate, regular rhythm, normal heart sounds and intact distal pulses.  Exam reveals no gallop and no friction rub.   No murmur heard. Pulmonary/Chest: Effort normal and breath sounds normal. No respiratory distress. He  has no wheezes. He has no rales.  Abdominal: Soft. Bowel sounds are normal. He exhibits no distension and no mass. There is no tenderness. There is no rebound and no guarding.  Musculoskeletal: Normal range of motion. He exhibits no edema and no tenderness.  Lymphadenopathy:    He has no cervical adenopathy.  Neurological: He is alert and oriented to person, place, and time.  Psychiatric: He has a normal mood and affect. His behavior is normal.          Assessment & Plan:

## 2011-01-13 NOTE — Assessment & Plan Note (Signed)
Restart pravastatin and recheck in another 5 months.

## 2011-01-13 NOTE — Patient Instructions (Signed)
Smoking Cessation This document explains the best ways for you to quit smoking and new treatments to help. It lists new medicines that can double or triple your chances of quitting and quitting for good. It also considers ways to avoid relapses and concerns you may have about quitting, including weight gain. NICOTINE: A POWERFUL ADDICTION If you have tried to quit smoking, you know how hard it can be. It is hard because nicotine is a very addictive drug. For some people, it can be as addictive as heroin or cocaine. Usually, people make 2 or 3 tries, or more, before finally being able to quit. Each time you try to quit, you can learn about what helps and what hurts. Quitting takes hard work and a lot of effort, but you can quit smoking. QUITTING SMOKING IS ONE OF THE MOST IMPORTANT THINGS YOU WILL EVER DO.  You will live longer, feel better, and live better.   The impact on your body of quitting smoking is felt almost immediately:   Within 20 minutes, blood pressure decreases. Pulse returns to its normal level.   After 8 hours, carbon monoxide levels in the blood return to normal. Oxygen level increases.   After 24 hours, chance of heart attack starts to decrease. Breath, hair, and body stop smelling like smoke.   After 48 hours, damaged nerve endings begin to recover. Sense of taste and smell improve.   After 72 hours, the body is virtually free of nicotine. Bronchial tubes relax and breathing becomes easier.   After 2 to 12 weeks, lungs can hold more air. Exercise becomes easier and circulation improves.   Quitting will reduce your risk of having a heart attack, stroke, cancer, or lung disease:   After 1 year, the risk of coronary heart disease is cut in half.   After 5 years, the risk of stroke falls to the same as a nonsmoker.   After 10 years, the risk of lung cancer is cut in half and the risk of other cancers decreases significantly.   After 15 years, the risk of coronary heart  disease drops, usually to the level of a nonsmoker.   If you are pregnant, quitting smoking will improve your chances of having a healthy baby.   The people you live with, especially your children, will be healthier.   You will have extra money to spend on things other than cigarettes.  FIVE KEYS TO QUITTING Studies have shown that these 5 steps will help you quit smoking and quit for good. You have the best chances of quitting if you use them together: 1. Get ready.  2. Get support and encouragement.  3. Learn new skills and behaviors.  4. Get medicine to reduce your nicotine addiction and use it correctly.  5. Be prepared for relapse or difficult situations. Be determined to continue trying to quit, even if you do not succeed at first.  1. GET READY  Set a quit date.   Change your environment.   Get rid of ALL cigarettes, ashtrays, matches, and lighters in your home, car, and place of work.   Do not let people smoke in your home.   Review your past attempts to quit. Think about what worked and what did not.   Once you quit, do not smoke. NOT EVEN A PUFF!  2. GET SUPPORT AND ENCOURAGEMENT Studies have shown that you have a better chance of being successful if you have help. You can get support in many ways.  Tell   your family, friends, and coworkers that you are going to quit and need their support. Ask them not to smoke around you.   Talk to your caregivers (doctor, dentist, nurse, pharmacist, psychologist, and/or smoking counselor).   Get individual, group, or telephone counseling and support. The more counseling you have, the better your chances are of quitting. Programs are available at local hospitals and health centers. Call your local health department for information about programs in your area.   Spiritual beliefs and practices may help some smokers quit.   Quit meters are small computer programs online or downloadable that keep track of quit statistics, such as amount  of "quit-time," cigarettes not smoked, and money saved.   Many smokers find one or more of the many self-help books available useful in helping them quit and stay off tobacco.  3. LEARN NEW SKILLS AND BEHAVIORS  Try to distract yourself from urges to smoke. Talk to someone, go for a walk, or occupy your time with a task.   When you first try to quit, change your routine. Take a different route to work. Drink tea instead of coffee. Eat breakfast in a different place.   Do something to reduce your stress. Take a hot bath, exercise, or read a book.   Plan something enjoyable to do every day. Reward yourself for not smoking.   Explore interactive web-based programs that specialize in helping you quit.  4. GET MEDICINE AND USE IT CORRECTLY Medicines can help you stop smoking and decrease the urge to smoke. Combining medicine with the above behavioral methods and support can quadruple your chances of successfully quitting smoking. The [place] Food and Drug Administration (FDA) has approved 7 medicines to help you quit smoking. These medicines fall into 3 categories.  Nicotine replacement therapy (delivers nicotine to your body without the negative effects and risks of smoking):   Nicotine gum: Available over-the-counter.   Nicotine lozenges: Available over-the-counter.   Nicotine inhaler: Available by prescription.   Nicotine nasal spray: Available by prescription.   Nicotine skin patches (transdermal): Available by prescription and over-the-counter.   Antidepressant medicine (helps people abstain from smoking, but how this works is unknown):   Bupropion sustained-release (SR) tablets: Available by prescription.   Nicotinic receptor partial agonist (simulates the effect of nicotine in your brain):   Varenicline tartrate tablets: Available by prescription.   Ask your caregiver for advice about which medicines to use and how to use them. Carefully read the information on the package.    Everyone who is trying to quit may benefit from using a medicine. If you are pregnant or trying to become pregnant, nursing an infant, you are under age 18, or you smoke fewer than 10 cigarettes per day, talk to your caregiver before taking any nicotine replacement medicines.   You should stop using a nicotine replacement product and call your caregiver if you experience nausea, dizziness, weakness, vomiting, fast or irregular heartbeat, mouth problems with the lozenge or gum, or redness or swelling of the skin around the patch that does not go away.   Do not use any other product containing nicotine while using a nicotine replacement product.   Talk to your caregiver before using these products if you have diabetes, heart disease, asthma, stomach ulcers, you had a recent heart attack, you have high blood pressure that is not controlled with medicine, a history of irregular heartbeat, or you have been prescribed medicine to help you quit smoking.  5. BE PREPARED FOR RELAPSE OR   DIFFICULT SITUATIONS  Most relapses occur within the first 3 months after quitting. Do not be discouraged if you start smoking again. Remember, most people try several times before they finally quit.   You may have symptoms of withdrawal because your body is used to nicotine. You may crave cigarettes, be irritable, feel very hungry, cough often, get headaches, or have difficulty concentrating.   The withdrawal symptoms are only temporary. They are strongest when you first quit, but they will go away within 10 to 14 days.  Here are some difficult situations to watch for:  Alcohol. Avoid drinking alcohol. Drinking lowers your chances of successfully quitting.   Caffeine. Try to reduce the amount of caffeine you consume. It also lowers your chances of successfully quitting.   Other smokers. Being around smoking can make you want to smoke. Avoid smokers.   Weight gain. Many smokers will gain weight when they quit, usually  less than 10 pounds. Eat a healthy diet and stay active. Do not let weight gain distract you from your main goal, quitting smoking. Some medicines that help you quit smoking may also help delay weight gain. You can always lose the weight gained after you quit.   Bad mood or depression. There are a lot of ways to improve your mood other than smoking.  If you are having problems with any of these situations, talk to your caregiver. SPECIAL SITUATIONS AND CONDITIONS Studies suggest that everyone can quit smoking. Your situation or condition can give you a special reason to quit.  Pregnant women/new mothers: By quitting, you protect your baby's health and your own.   Hospitalized patients: By quitting, you reduce health problems and help healing.   Heart attack patients: By quitting, you reduce your risk of a second heart attack.   Lung, head, and neck cancer patients: By quitting, you reduce your chance of a second cancer.   Parents of children and adolescents: By quitting, you protect your children from illnesses caused by secondhand smoke.  QUESTIONS TO THINK ABOUT Think about the following questions before you try to stop smoking. You may want to talk about your answers with your caregiver.  Why do you want to quit?   If you tried to quit in the past, what helped and what did not?   What will be the most difficult situations for you after you quit? How will you plan to handle them?   Who can help you through the tough times? Your family? Friends? Caregiver?   What pleasures do you get from smoking? What ways can you still get pleasure if you quit?  Here are some questions to ask your caregiver:  How can you help me to be successful at quitting?   What medicine do you think would be best for me and how should I take it?   What should I do if I need more help?   What is smoking withdrawal like? How can I get information on withdrawal?  Quitting takes hard work and a lot of effort,  but you can quit smoking. FOR MORE INFORMATION  Smokefree.gov (http://www.smokefree.gov) provides free, accurate, evidence-based information and professional assistance to help support the immediate and long-term needs of people trying to quit smoking. Document Released: 02/17/2001 Document Revised: 11/05/2010 Document Reviewed: 12/10/2008 [employer] Patient Information 2012 [employer]. 

## 2011-01-13 NOTE — Progress Notes (Signed)
Addended by: Neomia Dear on: 01/13/2011 09:34 AM   Modules accepted: Orders

## 2011-01-13 NOTE — Assessment & Plan Note (Signed)
Requesting a refill on Viagra. I also provided him with information to get free samples.

## 2011-01-13 NOTE — Assessment & Plan Note (Signed)
Patient is agreeable to try and stop smoking. I will start him on Chantix and plan to followup in 2-3 weeks.

## 2011-01-27 ENCOUNTER — Encounter: Payer: Self-pay | Admitting: Internal Medicine

## 2011-02-03 ENCOUNTER — Ambulatory Visit (INDEPENDENT_AMBULATORY_CARE_PROVIDER_SITE_OTHER): Payer: Self-pay | Admitting: Internal Medicine

## 2011-02-03 ENCOUNTER — Encounter: Payer: Self-pay | Admitting: Internal Medicine

## 2011-02-03 VITALS — BP 118/79 | HR 88 | Temp 98.1°F | Ht 73.25 in | Wt 159.7 lb

## 2011-02-03 DIAGNOSIS — K219 Gastro-esophageal reflux disease without esophagitis: Secondary | ICD-10-CM

## 2011-02-03 DIAGNOSIS — F17200 Nicotine dependence, unspecified, uncomplicated: Secondary | ICD-10-CM

## 2011-02-03 DIAGNOSIS — N529 Male erectile dysfunction, unspecified: Secondary | ICD-10-CM

## 2011-02-03 DIAGNOSIS — I10 Essential (primary) hypertension: Secondary | ICD-10-CM

## 2011-02-03 MED ORDER — VARDENAFIL HCL 20 MG PO TABS: 20.0000 mg | ORAL_TABLET | ORAL | Status: DC | PRN

## 2011-02-03 MED ORDER — RANITIDINE HCL 150 MG PO TABS: 150.0000 mg | ORAL_TABLET | Freq: Two times a day (BID) | ORAL | Status: DC

## 2011-02-03 MED ORDER — BUPROPION HCL ER (XL) 150 MG PO TB24: 150.0000 mg | ORAL_TABLET | ORAL | Status: DC

## 2011-02-03 NOTE — Patient Instructions (Signed)
Smoking Cessation This document explains the best ways for you to quit smoking and new treatments to help. It lists new medicines that can double or triple your chances of quitting and quitting for good. It also considers ways to avoid relapses and concerns you may have about quitting, including weight gain. NICOTINE: A POWERFUL ADDICTION If you have tried to quit smoking, you know how hard it can be. It is hard because nicotine is a very addictive drug. For some people, it can be as addictive as heroin or cocaine. Usually, people make 2 or 3 tries, or more, before finally being able to quit. Each time you try to quit, you can learn about what helps and what hurts. Quitting takes hard work and a lot of effort, but you can quit smoking. QUITTING SMOKING IS ONE OF THE MOST IMPORTANT THINGS YOU WILL EVER DO.  You will live longer, feel better, and live better.   The impact on your body of quitting smoking is felt almost immediately:   Within 20 minutes, blood pressure decreases. Pulse returns to its normal level.   After 8 hours, carbon monoxide levels in the blood return to normal. Oxygen level increases.   After 24 hours, chance of heart attack starts to decrease. Breath, hair, and body stop smelling like smoke.   After 48 hours, damaged nerve endings begin to recover. Sense of taste and smell improve.   After 72 hours, the body is virtually free of nicotine. Bronchial tubes relax and breathing becomes easier.   After 2 to 12 weeks, lungs can hold more air. Exercise becomes easier and circulation improves.   Quitting will reduce your risk of having a heart attack, stroke, cancer, or lung disease:   After 1 year, the risk of coronary heart disease is cut in half.   After 5 years, the risk of stroke falls to the same as a nonsmoker.   After 10 years, the risk of lung cancer is cut in half and the risk of other cancers decreases significantly.   After 15 years, the risk of coronary heart  disease drops, usually to the level of a nonsmoker.   If you are pregnant, quitting smoking will improve your chances of having a healthy baby.   The people you live with, especially your children, will be healthier.   You will have extra money to spend on things other than cigarettes.  FIVE KEYS TO QUITTING Studies have shown that these 5 steps will help you quit smoking and quit for good. You have the best chances of quitting if you use them together: 1. Get ready.  2. Get support and encouragement.  3. Learn new skills and behaviors.  4. Get medicine to reduce your nicotine addiction and use it correctly.  5. Be prepared for relapse or difficult situations. Be determined to continue trying to quit, even if you do not succeed at first.  1. GET READY  Set a quit date.   Change your environment.   Get rid of ALL cigarettes, ashtrays, matches, and lighters in your home, car, and place of work.   Do not let people smoke in your home.   Review your past attempts to quit. Think about what worked and what did not.   Once you quit, do not smoke. NOT EVEN A PUFF!  2. GET SUPPORT AND ENCOURAGEMENT Studies have shown that you have a better chance of being successful if you have help. You can get support in many ways.  Tell   your family, friends, and coworkers that you are going to quit and need their support. Ask them not to smoke around you.   Talk to your caregivers (doctor, dentist, nurse, pharmacist, psychologist, and/or smoking counselor).   Get individual, group, or telephone counseling and support. The more counseling you have, the better your chances are of quitting. Programs are available at local hospitals and health centers. Call your local health department for information about programs in your area.   Spiritual beliefs and practices may help some smokers quit.   Quit meters are small computer programs online or downloadable that keep track of quit statistics, such as amount  of "quit-time," cigarettes not smoked, and money saved.   Many smokers find one or more of the many self-help books available useful in helping them quit and stay off tobacco.  3. LEARN NEW SKILLS AND BEHAVIORS  Try to distract yourself from urges to smoke. Talk to someone, go for a walk, or occupy your time with a task.   When you first try to quit, change your routine. Take a different route to work. Drink tea instead of coffee. Eat breakfast in a different place.   Do something to reduce your stress. Take a hot bath, exercise, or read a book.   Plan something enjoyable to do every day. Reward yourself for not smoking.   Explore interactive web-based programs that specialize in helping you quit.  4. GET MEDICINE AND USE IT CORRECTLY Medicines can help you stop smoking and decrease the urge to smoke. Combining medicine with the above behavioral methods and support can quadruple your chances of successfully quitting smoking. The [place] Food and Drug Administration (FDA) has approved 7 medicines to help you quit smoking. These medicines fall into 3 categories.  Nicotine replacement therapy (delivers nicotine to your body without the negative effects and risks of smoking):   Nicotine gum: Available over-the-counter.   Nicotine lozenges: Available over-the-counter.   Nicotine inhaler: Available by prescription.   Nicotine nasal spray: Available by prescription.   Nicotine skin patches (transdermal): Available by prescription and over-the-counter.   Antidepressant medicine (helps people abstain from smoking, but how this works is unknown):   Bupropion sustained-release (SR) tablets: Available by prescription.   Nicotinic receptor partial agonist (simulates the effect of nicotine in your brain):   Varenicline tartrate tablets: Available by prescription.   Ask your caregiver for advice about which medicines to use and how to use them. Carefully read the information on the package.    Everyone who is trying to quit may benefit from using a medicine. If you are pregnant or trying to become pregnant, nursing an infant, you are under age 18, or you smoke fewer than 10 cigarettes per day, talk to your caregiver before taking any nicotine replacement medicines.   You should stop using a nicotine replacement product and call your caregiver if you experience nausea, dizziness, weakness, vomiting, fast or irregular heartbeat, mouth problems with the lozenge or gum, or redness or swelling of the skin around the patch that does not go away.   Do not use any other product containing nicotine while using a nicotine replacement product.   Talk to your caregiver before using these products if you have diabetes, heart disease, asthma, stomach ulcers, you had a recent heart attack, you have high blood pressure that is not controlled with medicine, a history of irregular heartbeat, or you have been prescribed medicine to help you quit smoking.  5. BE PREPARED FOR RELAPSE OR   DIFFICULT SITUATIONS  Most relapses occur within the first 3 months after quitting. Do not be discouraged if you start smoking again. Remember, most people try several times before they finally quit.   You may have symptoms of withdrawal because your body is used to nicotine. You may crave cigarettes, be irritable, feel very hungry, cough often, get headaches, or have difficulty concentrating.   The withdrawal symptoms are only temporary. They are strongest when you first quit, but they will go away within 10 to 14 days.  Here are some difficult situations to watch for:  Alcohol. Avoid drinking alcohol. Drinking lowers your chances of successfully quitting.   Caffeine. Try to reduce the amount of caffeine you consume. It also lowers your chances of successfully quitting.   Other smokers. Being around smoking can make you want to smoke. Avoid smokers.   Weight gain. Many smokers will gain weight when they quit, usually  less than 10 pounds. Eat a healthy diet and stay active. Do not let weight gain distract you from your main goal, quitting smoking. Some medicines that help you quit smoking may also help delay weight gain. You can always lose the weight gained after you quit.   Bad mood or depression. There are a lot of ways to improve your mood other than smoking.  If you are having problems with any of these situations, talk to your caregiver. SPECIAL SITUATIONS AND CONDITIONS Studies suggest that everyone can quit smoking. Your situation or condition can give you a special reason to quit.  Pregnant women/new mothers: By quitting, you protect your baby's health and your own.   Hospitalized patients: By quitting, you reduce health problems and help healing.   Heart attack patients: By quitting, you reduce your risk of a second heart attack.   Lung, head, and neck cancer patients: By quitting, you reduce your chance of a second cancer.   Parents of children and adolescents: By quitting, you protect your children from illnesses caused by secondhand smoke.  QUESTIONS TO THINK ABOUT Think about the following questions before you try to stop smoking. You may want to talk about your answers with your caregiver.  Why do you want to quit?   If you tried to quit in the past, what helped and what did not?   What will be the most difficult situations for you after you quit? How will you plan to handle them?   Who can help you through the tough times? Your family? Friends? Caregiver?   What pleasures do you get from smoking? What ways can you still get pleasure if you quit?  Here are some questions to ask your caregiver:  How can you help me to be successful at quitting?   What medicine do you think would be best for me and how should I take it?   What should I do if I need more help?   What is smoking withdrawal like? How can I get information on withdrawal?  Quitting takes hard work and a lot of effort,  but you can quit smoking. FOR MORE INFORMATION  Smokefree.gov (http://www.smokefree.gov) provides free, accurate, evidence-based information and professional assistance to help support the immediate and long-term needs of people trying to quit smoking. Document Released: 02/17/2001 Document Revised: 11/05/2010 Document Reviewed: 12/10/2008 [employer] Patient Information 2012 [employer]. 

## 2011-02-04 DIAGNOSIS — K219 Gastro-esophageal reflux disease without esophagitis: Secondary | ICD-10-CM | POA: Insufficient documentation

## 2011-02-04 NOTE — Assessment & Plan Note (Signed)
After talking to [facility] it was found that Levitra can be prescribed for $10 a pice for a 20mg  tab. I will give him 9 tabs with refills. Patient was advised to use 1/2 tab at a time before proceeding to a full tab. I have advised him to stop smoking which should help with ED.

## 2011-02-04 NOTE — Assessment & Plan Note (Signed)
Restart zantac and reassess at next office visit.

## 2011-02-04 NOTE — Assessment & Plan Note (Signed)
At goal.  

## 2011-02-04 NOTE — Assessment & Plan Note (Addendum)
Wellbutrin was prescribed today which should help with smoking cessation.  We spent about 10 minutes discussing various strategies about smoking cessation.

## 2011-02-04 NOTE — Progress Notes (Signed)
  Subjective:    Patient ID: [name], male    DOB: 10-Aug-1950, 60 y.o.   MRN: [id number]  HPI [name] is long term patient of mine who is here today for an annual check up and also some acute complaints.  He has multiple complaints..  His biggest concern is his erectile dysfunction. He is unable to afford $20 a pill for Viagra and requesting a solution for an alternative cheaper pill.  He is still smoking about quarter pack per day and wants to quit. He was prescribed chantix at last OV but could not really afford the copay.  He is complaining of reflux symptoms and says that he was on zantac in the past which helped him a lot.  No other complaints.   Review of Systems  Constitutional: Negative for fever, activity change and appetite change.  HENT: Negative for sore throat.   Respiratory: Negative for cough and shortness of breath.   Cardiovascular: Negative for chest pain and leg swelling.  Gastrointestinal: Negative for nausea, abdominal pain, diarrhea, constipation and abdominal distention.  Genitourinary: Negative for frequency, hematuria and difficulty urinating.  Musculoskeletal: Positive for back pain.  Neurological: Negative for dizziness and headaches.  Psychiatric/Behavioral: Negative for suicidal ideas and behavioral problems.       Objective:   Physical Exam  Constitutional: He is oriented to person, place, and time. He appears well-developed and well-nourished.  HENT:  Head: Normocephalic and atraumatic.  Eyes: Conjunctivae and EOM are normal. Pupils are equal, round, and reactive to light. No scleral icterus.  Neck: Normal range of motion. Neck supple. No JVD present. No thyromegaly present.  Cardiovascular: Normal rate, regular rhythm, normal heart sounds and intact distal pulses.  Exam reveals no gallop and no friction rub.   No murmur heard. Pulmonary/Chest: Effort normal and breath sounds normal. No respiratory distress. He has no wheezes. He has no  rales.  Abdominal: Soft. Bowel sounds are normal. He exhibits no distension and no mass. There is no tenderness. There is no rebound and no guarding.  Musculoskeletal: Normal range of motion. He exhibits no edema and no tenderness.  Lymphadenopathy:    He has no cervical adenopathy.  Neurological: He is alert and oriented to person, place, and time.  Psychiatric: He has a normal mood and affect. His behavior is normal.          Assessment & Plan:

## 2011-02-23 ENCOUNTER — Encounter: Payer: Self-pay | Admitting: Internal Medicine

## 2011-04-27 ENCOUNTER — Ambulatory Visit (INDEPENDENT_AMBULATORY_CARE_PROVIDER_SITE_OTHER): Payer: Medicare Other | Admitting: Internal Medicine

## 2011-04-27 ENCOUNTER — Encounter: Payer: Self-pay | Admitting: Internal Medicine

## 2011-04-27 VITALS — BP 163/98 | HR 80 | Temp 97.6°F | Ht 73.25 in | Wt 170.1 lb

## 2011-04-27 DIAGNOSIS — I10 Essential (primary) hypertension: Secondary | ICD-10-CM

## 2011-04-27 DIAGNOSIS — N529 Male erectile dysfunction, unspecified: Secondary | ICD-10-CM

## 2011-04-27 DIAGNOSIS — I6350 Cerebral infarction due to unspecified occlusion or stenosis of unspecified cerebral artery: Secondary | ICD-10-CM | POA: Diagnosis not present

## 2011-04-27 DIAGNOSIS — Z299 Encounter for prophylactic measures, unspecified: Secondary | ICD-10-CM

## 2011-04-27 DIAGNOSIS — Z139 Encounter for screening, unspecified: Secondary | ICD-10-CM

## 2011-04-27 MED ORDER — LISINOPRIL 10 MG PO TABS: 40.0000 mg | ORAL_TABLET | Freq: Every day | ORAL | Status: DC

## 2011-04-27 MED ORDER — VARDENAFIL HCL 20 MG PO TABS: 20.0000 mg | ORAL_TABLET | ORAL | Status: DC | PRN

## 2011-04-27 MED ORDER — CHLORTHALIDONE 25 MG PO TABS: 25.0000 mg | ORAL_TABLET | Freq: Every day | ORAL | Status: DC

## 2011-04-27 NOTE — Assessment & Plan Note (Signed)
Colonoscopy referral

## 2011-04-27 NOTE — Patient Instructions (Signed)

## 2011-04-27 NOTE — Assessment & Plan Note (Signed)
We discussed in detail about ways to quit smoking. 10 minutes were spent in counseling.

## 2011-04-27 NOTE — Assessment & Plan Note (Signed)
Patient has seen improvement with the use of Levitra. Given him for refills today.

## 2011-04-27 NOTE — Assessment & Plan Note (Signed)
I will call him back in one month for blood pressure recheck and his potassium and creatinine. Patient will also benefit from smoking cut down. We discussed ways nonpharmacologic control blood pressure like walking and exercise and diet.

## 2011-04-27 NOTE — Progress Notes (Signed)
  Subjective:    Patient ID: [name], male    DOB: [birth year], 61 y.o.   MRN: [id number]  HPI [name] is here today for a regular follow up visit.  His ED is better with Levitra which was prescribed at last OV.  He has no complaints at this time. Colonoscopy was back in 80's and is due for one. Smoking cessation counseling given today as he is still smoking 2-3 cigs a day.  His blood pressure is a little bit high today and I will change his blood pressure medications and have him come back.        Review of Systems  Constitutional: Negative for fever, activity change and appetite change.  HENT: Negative for sore throat.   Respiratory: Negative for cough and shortness of breath.   Cardiovascular: Negative for chest pain and leg swelling.  Gastrointestinal: Negative for nausea, abdominal pain, diarrhea, constipation and abdominal distention.  Genitourinary: Negative for frequency, hematuria and difficulty urinating.  Neurological: Negative for dizziness and headaches.  Psychiatric/Behavioral: Negative for suicidal ideas and behavioral problems.       Objective:   Physical Exam  Constitutional: He is oriented to person, place, and time. He appears well-developed and well-nourished.  HENT:  Head: Normocephalic and atraumatic.  Eyes: Conjunctivae and EOM are normal. Pupils are equal, round, and reactive to light. No scleral icterus.  Neck: Normal range of motion. Neck supple. No JVD present. No thyromegaly present.  Cardiovascular: Normal rate, regular rhythm, normal heart sounds and intact distal pulses.  Exam reveals no gallop and no friction rub.   No murmur heard. Pulmonary/Chest: Effort normal and breath sounds normal. No respiratory distress. He has no wheezes. He has no rales.  Abdominal: Soft. Bowel sounds are normal. He exhibits no distension and no mass. There is no tenderness. There is no rebound and no guarding.  Musculoskeletal: Normal range of motion. He  exhibits no edema and no tenderness.  Lymphadenopathy:    He has no cervical adenopathy.  Neurological: He is alert and oriented to person, place, and time.  Psychiatric: He has a normal mood and affect. His behavior is normal.          Assessment & Plan:

## 2011-05-21 ENCOUNTER — Encounter: Payer: Medicare Other | Admitting: Internal Medicine

## 2011-05-25 ENCOUNTER — Telehealth: Payer: Self-pay | Admitting: Internal Medicine

## 2011-05-25 NOTE — Telephone Encounter (Signed)
Called Dr. Haywood Pao office and this patient has yet to keep his appointment.  Spoke with Thurston Pounds and she stated that the patient keeps forgetting his appointment and has another appointment  for 06/01/2011 with Dr. Elnoria Howard.

## 2011-05-27 ENCOUNTER — Encounter: Payer: Medicare Other | Admitting: Internal Medicine

## 2011-06-01 DIAGNOSIS — R197 Diarrhea, unspecified: Secondary | ICD-10-CM | POA: Diagnosis not present

## 2011-06-01 DIAGNOSIS — K5900 Constipation, unspecified: Secondary | ICD-10-CM | POA: Diagnosis not present

## 2011-06-01 DIAGNOSIS — Z1211 Encounter for screening for malignant neoplasm of colon: Secondary | ICD-10-CM | POA: Diagnosis not present

## 2011-06-18 DIAGNOSIS — K649 Unspecified hemorrhoids: Secondary | ICD-10-CM | POA: Diagnosis not present

## 2011-06-18 DIAGNOSIS — K5730 Diverticulosis of large intestine without perforation or abscess without bleeding: Secondary | ICD-10-CM | POA: Diagnosis not present

## 2011-06-18 DIAGNOSIS — Z1211 Encounter for screening for malignant neoplasm of colon: Secondary | ICD-10-CM | POA: Diagnosis not present

## 2011-07-16 ENCOUNTER — Encounter: Payer: Self-pay | Admitting: Internal Medicine

## 2011-07-16 ENCOUNTER — Ambulatory Visit (INDEPENDENT_AMBULATORY_CARE_PROVIDER_SITE_OTHER): Payer: Medicare Other | Admitting: Internal Medicine

## 2011-07-16 VITALS — BP 183/117 | HR 85 | Temp 97.4°F | Ht 73.0 in | Wt 168.8 lb

## 2011-07-16 DIAGNOSIS — I10 Essential (primary) hypertension: Secondary | ICD-10-CM | POA: Diagnosis not present

## 2011-07-16 LAB — COMPREHENSIVE METABOLIC PANEL
Alkaline Phosphatase: 84 U/L (ref 39–117)
BUN: 13 mg/dL (ref 6–23)
Glucose, Bld: 87 mg/dL (ref 70–99)
Sodium: 141 mEq/L (ref 135–145)
Total Bilirubin: 0.5 mg/dL (ref 0.3–1.2)

## 2011-07-16 MED ORDER — LISINOPRIL 20 MG PO TABS: 20.0000 mg | ORAL_TABLET | Freq: Every day | ORAL | Status: DC

## 2011-07-16 MED ORDER — METOPROLOL TARTRATE 50 MG PO TABS: 50.0000 mg | ORAL_TABLET | Freq: Two times a day (BID) | ORAL | Status: DC

## 2011-07-16 NOTE — Assessment & Plan Note (Signed)
Lab Results  Component Value Date   NA 140 08/14/2010   K 4.3 08/14/2010   CL 102 08/14/2010   CO2 28 08/14/2010   BUN 22 08/14/2010   CREATININE 1.06 08/14/2010   CREATININE 1.0 04/09/2010    BP Readings from Last 3 Encounters:  07/16/11 183/117  04/27/11 163/98  02/03/11 118/79    Assessment: Hypertension control:  moderately elevated  Progress toward goals:  deteriorated Barriers to meeting goals:  nonadherence to medications  Plan: Hypertension treatment:  Will start patient on metoprolol 50 mg bid, and continue lisinopril. Recheck in 2 weeks.

## 2011-07-16 NOTE — Progress Notes (Signed)
Subjective:     Patient ID: [name], male   DOB: 1950-06-28, 61 y.o.   MRN: [id number]  HPI [name] is a 61 year old man with history of HLD, HTN, and GERD who presents for blood pressure follow up. Patient was started on chlorthalidone in February 2013 and patient states this medication made him extremely nauseous. He subsequently stopped taking all of his medications.  Patient has no other complaints or concerns today. He denies chest pain, cough, sob, headache, N/V, changes in abdominal and urinary character. He still complains of ED, but states the viagra does not work and that cialis is too expensive. He is wondering if there are any cheaper medications.      Review of Systems  All other systems reviewed and are negative.       Objective:   Physical Exam  Constitutional: He is oriented to person, place, and time. He appears well-developed.  HENT:  Head: Normocephalic.  Eyes: EOM are normal. Pupils are equal, round, and reactive to light.  Neck: Normal range of motion. Neck supple. No JVD present.  Cardiovascular: Normal rate, regular rhythm and normal heart sounds.  Exam reveals no gallop and no friction rub.   No murmur heard. Pulmonary/Chest: Effort normal and breath sounds normal. He has no rales.  Abdominal: Soft. Bowel sounds are normal.  Musculoskeletal: Normal range of motion.  Neurological: He is alert and oriented to person, place, and time.  Psychiatric: He has a normal mood and affect.

## 2011-07-16 NOTE — Patient Instructions (Signed)
Please start taking Lisinopril once a day for your blood pressure. Please start taking Metoprolol twice daily for your blood pressure. Please follow up in 2 weeks.

## 2011-07-23 ENCOUNTER — Emergency Department (HOSPITAL_COMMUNITY): Payer: Medicare Other

## 2011-07-23 ENCOUNTER — Encounter (HOSPITAL_COMMUNITY): Payer: Self-pay | Admitting: *Deleted

## 2011-07-23 ENCOUNTER — Emergency Department (HOSPITAL_COMMUNITY)
Admission: EM | Admit: 2011-07-23 | Discharge: 2011-07-24 | Disposition: A | Discharge status: Home / Self Care | Payer: Medicare Other | Attending: Emergency Medicine | Admitting: Emergency Medicine

## 2011-07-23 DIAGNOSIS — R1013 Epigastric pain: Secondary | ICD-10-CM | POA: Diagnosis not present

## 2011-07-23 DIAGNOSIS — R112 Nausea with vomiting, unspecified: Secondary | ICD-10-CM | POA: Insufficient documentation

## 2011-07-23 DIAGNOSIS — R51 Headache: Secondary | ICD-10-CM | POA: Diagnosis not present

## 2011-07-23 DIAGNOSIS — K2970 Gastritis, unspecified, without bleeding: Secondary | ICD-10-CM | POA: Insufficient documentation

## 2011-07-23 DIAGNOSIS — J449 Chronic obstructive pulmonary disease, unspecified: Secondary | ICD-10-CM | POA: Diagnosis not present

## 2011-07-23 DIAGNOSIS — D573 Sickle-cell trait: Secondary | ICD-10-CM | POA: Diagnosis not present

## 2011-07-23 DIAGNOSIS — I10 Essential (primary) hypertension: Secondary | ICD-10-CM | POA: Insufficient documentation

## 2011-07-23 DIAGNOSIS — K2990 Gastroduodenitis, unspecified, without bleeding: Secondary | ICD-10-CM | POA: Diagnosis not present

## 2011-07-23 DIAGNOSIS — F17200 Nicotine dependence, unspecified, uncomplicated: Secondary | ICD-10-CM | POA: Diagnosis not present

## 2011-07-23 DIAGNOSIS — N390 Urinary tract infection, site not specified: Secondary | ICD-10-CM | POA: Insufficient documentation

## 2011-07-23 DIAGNOSIS — R079 Chest pain, unspecified: Secondary | ICD-10-CM | POA: Diagnosis not present

## 2011-07-23 DIAGNOSIS — J984 Other disorders of lung: Secondary | ICD-10-CM | POA: Diagnosis not present

## 2011-07-23 HISTORY — DX: Essential (primary) hypertension: I10

## 2011-07-23 HISTORY — DX: Cerebral infarction, unspecified: I63.9

## 2011-07-23 LAB — CBC
HCT: 42.1 % (ref 39.0–52.0)
Hemoglobin: 15 g/dL (ref 13.0–17.0)
MCH: 29.1 pg (ref 26.0–34.0)
MCV: 81.6 fL (ref 78.0–100.0)
RBC: 5.16 MIL/uL (ref 4.22–5.81)

## 2011-07-23 LAB — COMPREHENSIVE METABOLIC PANEL
Alkaline Phosphatase: 86 U/L (ref 39–117)
BUN: 23 mg/dL (ref 6–23)
Calcium: 9.3 mg/dL (ref 8.4–10.5)
GFR calc Af Amer: 71 mL/min — ABNORMAL LOW (ref >90)
Glucose, Bld: 98 mg/dL (ref 70–99)
Potassium: 3.8 mEq/L (ref 3.5–5.1)
Total Protein: 7.6 g/dL (ref 6.0–8.3)

## 2011-07-23 LAB — POCT I-STAT TROPONIN I

## 2011-07-23 LAB — DIFFERENTIAL
Eosinophils Absolute: 0.2 10*3/uL (ref 0.0–0.7)
Eosinophils Relative: 3 % (ref 0–5)
Lymphs Abs: 2.2 10*3/uL (ref 0.7–4.0)
Monocytes Relative: 8 % (ref 3–12)

## 2011-07-23 LAB — LIPASE, BLOOD: Lipase: 23 U/L (ref 11–59)

## 2011-07-23 LAB — URINALYSIS, ROUTINE W REFLEX MICROSCOPIC
Bilirubin Urine: NEGATIVE
Nitrite: NEGATIVE
Protein, ur: NEGATIVE mg/dL
Specific Gravity, Urine: 1.017 (ref 1.005–1.030)
Urobilinogen, UA: 1 mg/dL (ref 0.0–1.0)

## 2011-07-23 MED ORDER — ONDANSETRON HCL 4 MG PO TABS: 4.0000 mg | ORAL_TABLET | Freq: Four times a day (QID) | ORAL | Status: AC

## 2011-07-23 MED ORDER — PANTOPRAZOLE SODIUM 40 MG IV SOLR
40.0000 mg | Freq: Once | INTRAVENOUS | Status: AC
  Administered 2011-07-23: 40 mg via INTRAVENOUS
  Filled 2011-07-23: qty 40

## 2011-07-23 MED ORDER — METOCLOPRAMIDE HCL 5 MG/ML IJ SOLN
10.0000 mg | Freq: Once | INTRAMUSCULAR | Status: AC
  Administered 2011-07-23: 10 mg via INTRAVENOUS
  Filled 2011-07-23: qty 2

## 2011-07-23 MED ORDER — PANTOPRAZOLE SODIUM 20 MG PO TBEC: 40.0000 mg | DELAYED_RELEASE_TABLET | Freq: Every day | ORAL | Status: DC

## 2011-07-23 MED ORDER — SODIUM CHLORIDE 0.9 % IV BOLUS (SEPSIS)
500.0000 mL | Freq: Once | INTRAVENOUS | Status: AC
  Administered 2011-07-23: 500 mL via INTRAVENOUS

## 2011-07-23 MED ORDER — CIPROFLOXACIN HCL 250 MG PO TABS: 500.0000 mg | ORAL_TABLET | Freq: Two times a day (BID) | ORAL | Status: AC

## 2011-07-23 MED ORDER — MORPHINE SULFATE 4 MG/ML IJ SOLN
4.0000 mg | Freq: Once | INTRAMUSCULAR | Status: AC
  Administered 2011-07-23: 4 mg via INTRAVENOUS
  Filled 2011-07-23: qty 1

## 2011-07-23 MED ORDER — GI COCKTAIL ~~LOC~~
30.0000 mL | Freq: Once | ORAL | Status: AC
  Administered 2011-07-23: 30 mL via ORAL
  Filled 2011-07-23: qty 30

## 2011-07-23 NOTE — Discharge Instructions (Signed)
Gastritis Gastritis is an irritation of the stomach. This is often caused by medications, but can be from anything that bothers the stomach. Other stomach irritants are:  Alcohol.   Caffeine.   Nicotine.   Spicy or acid foods.   Medications for pain and arthritis. Aspirin and other anti-inflammatory medicines such as ibuprofen (Advil), naproxen (Aleve), and ketoprofen (Orudis) can be highly irritating.   Emotional distress.  Symptoms of gastritis may include:  Abdominal pain.   Indigestion.   Nausea and or vomiting.   Bleeding.  Some patients with chronic gastritis and ulcers have been infected by a germ. They may need special testing. Medications which kill germs can be used to cure this condition. Treatment includes avoiding the substances mentioned above that are known to cause stomach trouble. Medications used to treat gastritis can include:  Antacids.   Medicines to control vomiting.   Acid blocking medicines.  Symptoms of gastritis usually improve within 2-3 days of starting treatment. Call your caregiver if you are not better in a few days. SEEK MEDICAL CARE IF:   You have increased stomach or chest pain.   You vomit blood.   You faint or feel lightheaded.   You cannot keep fluids down.   You pass bloody or black stools.   You develop severe back pain.  MAKE SURE YOU:   Understand these instructions.   Will watch your condition.   Will get help right away if you are not doing well or get worse.  Document Released: 02/23/2005 Document Revised: 02/12/2011 Document Reviewed: 08/11/2006 [facility] Patient Information 2012 [place], [place].  Urinary Tract Infection Infections of the urinary tract can start in several places. A bladder infection (cystitis), a kidney infection (pyelonephritis), and a prostate infection (prostatitis) are different types of urinary tract infections (UTIs). They usually get better if treated with medicines (antibiotics) that kill  germs. Take all the medicine until it is gone. You or your child may feel better in a few days, but TAKE ALL MEDICINE or the infection may not respond and may become more difficult to treat. HOME CARE INSTRUCTIONS   Drink enough water and fluids to keep the urine clear or pale yellow. Cranberry juice is especially recommended, in addition to large amounts of water.   Avoid caffeine, tea, and carbonated beverages. They tend to irritate the bladder.   Alcohol may irritate the prostate.   Only take over-the-counter or prescription medicines for pain, discomfort, or fever as directed by your caregiver.  To prevent further infections:  Empty the bladder often. Avoid holding urine for long periods of time.   After a bowel movement, women should cleanse from front to back. Use each tissue only once.   Empty the bladder before and after sexual intercourse.  FINDING OUT THE RESULTS OF YOUR TEST Not all test results are available during your visit. If your or your child's test results are not back during the visit, make an appointment with your caregiver to find out the results. Do not assume everything is normal if you have not heard from your caregiver or the medical facility. It is important for you to follow up on all test results. SEEK MEDICAL CARE IF:   There is back pain.   Your baby is older than 3 months with a rectal temperature of 100.5 F (38.1 C) or higher for more than 1 day.   Your or your child's problems (symptoms) are no better in 3 days. Return sooner if you or your child is  getting worse.  SEEK IMMEDIATE MEDICAL CARE IF:   There is severe back pain or lower abdominal pain.   You or your child develops chills.   You have a fever.   Your baby is older than 3 months with a rectal temperature of 102 F (38.9 C) or higher.   Your baby is 52 months old or younger with a rectal temperature of 100.4 F (38 C) or higher.   There is nausea or vomiting.   There is continued  burning or discomfort with urination.  MAKE SURE YOU:   Understand these instructions.   Will watch your condition.   Will get help right away if you are not doing well or get worse.  Document Released: 12/03/2004 Document Revised: 02/12/2011 Document Reviewed: 07/08/2006 [facility] Patient Information 2012 [place], [place].

## 2011-07-23 NOTE — ED Notes (Signed)
Pt in c/o burning pain in stomach and intermittent nausea x1 week, states he had a colonoscopy 1 month ago and was told he had some "soft spots" in his stomach and wanted that evaluated

## 2011-07-23 NOTE — ED Notes (Signed)
MD at bedside. 

## 2011-07-23 NOTE — ED Notes (Signed)
Pt reports "sick on stomach" that is worse after eating for one week; reports acid reflux history and diverticulosis found recent. Abdominal discomfort in epigastric and down middle of abdomin when it occurs. None currently. Reports nausea without emesis.

## 2011-07-23 NOTE — ED Notes (Signed)
Patient transported to X-ray 

## 2011-07-23 NOTE — ED Provider Notes (Signed)
History     CSN: [id number]  Arrival date & time 07/23/11  1909   First MD Initiated Contact with Patient 07/23/11 2021      Chief Complaint  Patient presents with  . Nausea    (Consider location/radiation/quality/duration/timing/severity/associated sxs/prior treatment) HPI Pt presents with several complaints. States he has had increasingly frequent HA described as bl pounding associated with nausea. No focal weakness, neck pain, fever chills. Pt has also had increasing epigastric pain radiating into chest x 2 weeks. +vomiting. No diarrhea, blood in stool. No SOB, cough.  Past Medical History  Diagnosis Date  . Tuberculosis at age 47-3  . Sickle cell trait   . Stroke   . Hypertension     Past Surgical History  Procedure Date  . Appendectomy 1977  . Vasectomy 1990    Family History  Problem Relation Age of Onset  . Cancer Sister   . Sickle cell anemia Son     History  Substance Use Topics  . Smoking status: Current Everyday Smoker -- 0.2 packs/day for 10 years    Types: Cigarettes  . Smokeless tobacco: Not on file  . Alcohol Use: No      Review of Systems  Constitutional: Negative for fever and chills.  HENT: Negative for neck pain and neck stiffness.   Eyes: Negative for photophobia and visual disturbance.  Respiratory: Negative for cough and shortness of breath.   Cardiovascular: Positive for chest pain. Negative for palpitations and leg swelling.  Gastrointestinal: Positive for nausea, vomiting and abdominal pain. Negative for diarrhea and constipation.  Musculoskeletal: Negative for myalgias and back pain.  Skin: Negative for pallor, rash and wound.  Neurological: Positive for headaches. Negative for syncope, weakness and numbness.    Allergies  Review of patient's allergies indicates no known allergies.  Home Medications   Current Outpatient Rx  Name Route Sig Dispense Refill  . ASPIRIN 325 MG PO TABS Oral Take 1 tablet (325 mg total) by mouth  daily. 100 tablet 11  . CHLORTHALIDONE 25 MG PO TABS Oral Take 25 mg by mouth daily.    Marland Kitchen LISINOPRIL 20 MG PO TABS Oral Take 1 tablet (20 mg total) by mouth daily. 30 tablet 3  . CIPROFLOXACIN HCL 250 MG PO TABS Oral Take 2 tablets (500 mg total) by mouth every 12 (twelve) hours. 14 tablet 0  . METOPROLOL TARTRATE 50 MG PO TABS Oral Take 1 tablet (50 mg total) by mouth 2 (two) times daily. 60 tablet 1  . ONDANSETRON HCL 4 MG PO TABS Oral Take 1 tablet (4 mg total) by mouth every 6 (six) hours. 12 tablet 0  . PANTOPRAZOLE SODIUM 20 MG PO TBEC Oral Take 2 tablets (40 mg total) by mouth daily. 30 tablet 0    BP 111/77  Pulse 60  Temp(Src) 97.6 F (36.4 C) (Oral)  Resp 15  SpO2 98%  Physical Exam  Nursing note and vitals reviewed. Constitutional: He is oriented to person, place, and time. He appears well-developed and well-nourished. No distress.  HENT:  Head: Normocephalic and atraumatic.  Mouth/Throat: Oropharynx is clear and moist.  Eyes: EOM are normal. Pupils are equal, round, and reactive to light.  Neck: Normal range of motion. Neck supple.  Cardiovascular: Normal rate and regular rhythm.   Pulmonary/Chest: Effort normal and breath sounds normal. No respiratory distress. He has no wheezes. He has no rales.  Abdominal: Soft. Bowel sounds are normal. He exhibits no mass. There is tenderness (mild epigastric ttp). There is  no rebound and no guarding.  Musculoskeletal: Normal range of motion. He exhibits no edema and no tenderness.  Neurological: He is alert and oriented to person, place, and time.       5/5 motor, sensation intact.   Skin: Skin is warm and dry. No rash noted. No erythema.  Psychiatric: He has a normal mood and affect. His behavior is normal.    ED Course  Procedures (including critical care time)  Labs Reviewed  COMPREHENSIVE METABOLIC PANEL - Abnormal; Notable for the following:    GFR calc non Af Amer 61 (*)    GFR calc Af Amer 71 (*)    All other  components within normal limits  URINALYSIS, ROUTINE W REFLEX MICROSCOPIC - Abnormal; Notable for the following:    APPearance CLOUDY (*)    Leukocytes, UA MODERATE (*)    All other components within normal limits  URINE MICROSCOPIC-ADD ON - Abnormal; Notable for the following:    Squamous Epithelial / LPF FEW (*)    All other components within normal limits  CBC  DIFFERENTIAL  LIPASE, BLOOD  POCT I-STAT TROPONIN I   Dg Chest 2 View  07/23/2011  *RADIOLOGY REPORT*  Clinical Data: Chest pain with sweats and chills.  CHEST - 2 VIEW  Comparison: 01/12/2009  Findings: COPD.  Dual lead pacemaker.  Normal heart size.  No osseous abnormality. Moderate biapical scarring worse on the left. Similar to priors.  IMPRESSION: COPD.  No active infiltrates are identified.  Original Report Authenticated By: Elsie Stain, M.D.   Ct Head Wo Contrast  07/23/2011  *RADIOLOGY REPORT*  Clinical Data: Headache.  Prior right medial thalamic stroke.  CT HEAD WITHOUT CONTRAST  Technique:  Contiguous axial images were obtained from the base of the skull through the vertex without contrast.  Comparison: 10/04/2008  Findings: There is only highly subtle hypodensity in the right medial thalamus at the site of the prior infarct.  The brain stem, cerebellum, cerebral peduncles, left thalamus, basal ganglia, basilar cisterns, and ventricular system appear normal.  No intracranial hemorrhage, mass lesion, or acute infarction is identified.  The visualized paranasal sinuses appear clear.  IMPRESSION:  1. 1.  No acute intracranial findings. 2.  There is only subtle hypodensity in the medial right thalamus at the site of the prior infarct from 2010.  Original Report Authenticated By: Dellia Cloud, M.D.     1. UTI (lower urinary tract infection)   2. Gastritis      Date: 07/23/2011  Rate: 60  Rhythm: atrial paced rhythm  QRS Axis: normal  Intervals: normal  ST/T Wave abnormalities: nonspecific ST changes   Conduction Disutrbances:none  Narrative Interpretation:   Old EKG Reviewed: changes noted     MDM  Pt with improved symptoms. F/u with PMD and return to ED for concerns        Loren Racer, MD 07/23/11 2158

## 2011-07-24 NOTE — ED Notes (Signed)
Pt verbalizes understanding.  Nadn.

## 2011-07-30 ENCOUNTER — Ambulatory Visit (INDEPENDENT_AMBULATORY_CARE_PROVIDER_SITE_OTHER): Payer: Medicare Other | Admitting: Internal Medicine

## 2011-07-30 ENCOUNTER — Encounter: Payer: Self-pay | Admitting: Internal Medicine

## 2011-07-30 VITALS — BP 123/88 | HR 82 | Temp 97.7°F | Ht 73.0 in | Wt 163.3 lb

## 2011-07-30 DIAGNOSIS — E785 Hyperlipidemia, unspecified: Secondary | ICD-10-CM | POA: Diagnosis not present

## 2011-07-30 DIAGNOSIS — I10 Essential (primary) hypertension: Secondary | ICD-10-CM | POA: Diagnosis not present

## 2011-07-30 DIAGNOSIS — N390 Urinary tract infection, site not specified: Secondary | ICD-10-CM

## 2011-07-30 LAB — LIPID PANEL
Cholesterol: 223 mg/dL — ABNORMAL HIGH (ref 0–200)
HDL: 47 mg/dL (ref >39)
Total CHOL/HDL Ratio: 4.7 Ratio

## 2011-07-30 NOTE — Assessment & Plan Note (Addendum)
Lab Results  Component Value Date   CHOL 171 06/24/2010   HDL 53 06/24/2010   LDLCALC 105* 06/24/2010   TRIG 67 06/24/2010   CHOLHDL 3.2 06/24/2010   Check lipids today, given history of CVA, LDL should be <100. Will not restart statin in setting of acute nausea. Will check lipids and likely restart statin after obtaining results.

## 2011-07-30 NOTE — Patient Instructions (Signed)
Please do not take Lisinopril for 3-4 days.  Please continue to take antibiotics until completed. Please follow up in 3-6 months.

## 2011-07-30 NOTE — Assessment & Plan Note (Signed)
Lab Results  Component Value Date   NA 136 07/23/2011   K 3.8 07/23/2011   CL 97 07/23/2011   CO2 28 07/23/2011   BUN 23 07/23/2011   CREATININE 1.23 07/23/2011   CREATININE 0.95 07/16/2011    BP Readings from Last 3 Encounters:  07/30/11 123/88  07/24/11 99/70  07/16/11 183/117    Assessment: Hypertension control:  controlled  Progress toward goals:  at goal Barriers to meeting goals:  no barriers identified  Plan: Hypertension treatment:  continue current medications Started metoprolol bid 2 weeks ago, and has brought bp down 120s systolic. Patient wants to stay on this dose for now. I did suggest decreasing dose of metoprolol in setting of feeling nauseated, however he would like to continue current regimen the way it is.

## 2011-07-30 NOTE — Assessment & Plan Note (Signed)
U/A showed moderate leukocytes with 11-20 WBCs, patient started on cipro for 10 days in ER on 05/17. Patient still completing course.

## 2011-07-30 NOTE — Progress Notes (Signed)
Patient ID: [name], male   DOB: Dec 14, 1950, 61 y.o.   MRN: [id number] Subjective:   HPI [name] is a 61 year old man with history of HLD, HTN, and GERD who presents for blood pressure follow up. Patient was started on chlorthalidone in February 2013 and patient states this medication made him extremely nauseous. He subsequently stopped taking all of his medications. He was started on metoprolol and lisinopril 2 weeks ago and is here for blood pressure re-check. Patient also reports he went to ED 05/17 for nausea where he was found to have UTI. He was prescribed cipro and zofran for this and he has been taking these medications as directed.   Patient has no other complaints or concerns today. He denies chest pain, cough, sob, headache, N/V, changes in abdominal and urinary character.   Review of Systems  Gastrointestinal: Positive for nausea. Negative for vomiting.  Genitourinary: Negative for dysuria, urgency, frequency, hematuria, flank pain and difficulty urinating.  All other systems reviewed and are negative.       Objective:   Physical Exam  Constitutional: He is oriented to person, place, and time. He appears well-developed.  HENT:  Head: Normocephalic.  Eyes: EOM are normal. Pupils are equal, round, and reactive to light.  Neck: Normal range of motion. Neck supple. No JVD present.  Cardiovascular: Normal rate, regular rhythm and normal heart sounds.  Exam reveals no gallop and no friction rub.   No murmur heard. Pulmonary/Chest: Effort normal and breath sounds normal. He has no rales.  Abdominal: Soft. Bowel sounds are normal.  Musculoskeletal: Normal range of motion.  Neurological: He is alert and oriented to person, place, and time.  Psychiatric: He has a normal mood and affect.

## 2011-08-04 ENCOUNTER — Other Ambulatory Visit: Payer: Self-pay | Admitting: Internal Medicine

## 2011-08-04 DIAGNOSIS — E785 Hyperlipidemia, unspecified: Secondary | ICD-10-CM

## 2011-08-04 MED ORDER — PRAVASTATIN SODIUM 40 MG PO TABS: 40.0000 mg | ORAL_TABLET | Freq: Every evening | ORAL | Status: DC

## 2011-08-04 NOTE — Assessment & Plan Note (Signed)
Lab Results  Component Value Date   CHOL 223* 07/30/2011   HDL 47 07/30/2011   LDLCALC 153* 07/30/2011   TRIG 117 07/30/2011   CHOLHDL 4.7 07/30/2011   LDL not at goal, should be <100 given prior CVA. Will go ahead start statin.

## 2011-08-06 NOTE — Progress Notes (Signed)
Pt returned call; informed of lipid result and new rx for Pravastatin per Dr Baltazar Apo.

## 2011-09-13 ENCOUNTER — Emergency Department (HOSPITAL_COMMUNITY): Payer: Medicare Other

## 2011-09-13 ENCOUNTER — Encounter (HOSPITAL_COMMUNITY): Payer: Self-pay

## 2011-09-13 ENCOUNTER — Observation Stay (HOSPITAL_COMMUNITY)
Admission: EM | Admit: 2011-09-13 | Discharge: 2011-09-14 | Discharge status: Against Medical Advice | Payer: Medicare Other | Attending: Internal Medicine | Admitting: Internal Medicine

## 2011-09-13 DIAGNOSIS — I10 Essential (primary) hypertension: Secondary | ICD-10-CM | POA: Diagnosis not present

## 2011-09-13 DIAGNOSIS — F17200 Nicotine dependence, unspecified, uncomplicated: Secondary | ICD-10-CM | POA: Diagnosis not present

## 2011-09-13 DIAGNOSIS — E785 Hyperlipidemia, unspecified: Secondary | ICD-10-CM | POA: Insufficient documentation

## 2011-09-13 DIAGNOSIS — D573 Sickle-cell trait: Secondary | ICD-10-CM | POA: Diagnosis not present

## 2011-09-13 DIAGNOSIS — J438 Other emphysema: Secondary | ICD-10-CM | POA: Diagnosis not present

## 2011-09-13 DIAGNOSIS — R918 Other nonspecific abnormal finding of lung field: Secondary | ICD-10-CM | POA: Diagnosis not present

## 2011-09-13 DIAGNOSIS — Z8611 Personal history of tuberculosis: Secondary | ICD-10-CM | POA: Insufficient documentation

## 2011-09-13 DIAGNOSIS — R079 Chest pain, unspecified: Secondary | ICD-10-CM | POA: Diagnosis not present

## 2011-09-13 DIAGNOSIS — Z720 Tobacco use: Secondary | ICD-10-CM | POA: Diagnosis present

## 2011-09-13 DIAGNOSIS — J984 Other disorders of lung: Secondary | ICD-10-CM | POA: Diagnosis not present

## 2011-09-13 LAB — D-DIMER, QUANTITATIVE: D-Dimer, Quant: 0.48 ug/mL-FEU (ref 0.00–0.48)

## 2011-09-13 LAB — BASIC METABOLIC PANEL
BUN: 9 mg/dL (ref 6–23)
CO2: 27 mEq/L (ref 19–32)
Calcium: 9.5 mg/dL (ref 8.4–10.5)
Creatinine, Ser: 0.91 mg/dL (ref 0.50–1.35)

## 2011-09-13 LAB — CBC
HCT: 37.5 % — ABNORMAL LOW (ref 39.0–52.0)
MCH: 29 pg (ref 26.0–34.0)
MCV: 83.1 fL (ref 78.0–100.0)
Platelets: 187 10*3/uL (ref 150–400)
RBC: 4.51 MIL/uL (ref 4.22–5.81)

## 2011-09-13 LAB — CARDIAC PANEL(CRET KIN+CKTOT+MB+TROPI)
CK, MB: 1.5 ng/mL (ref 0.3–4.0)
Relative Index: 1.3 (ref 0.0–2.5)
Total CK: 103 U/L (ref 7–232)
Total CK: 100 U/L (ref 7–232)

## 2011-09-13 MED ORDER — NITROGLYCERIN 0.4 MG SL SUBL
0.4000 mg | SUBLINGUAL_TABLET | SUBLINGUAL | Status: AC | PRN
  Administered 2011-09-13 (×3): 0.4 mg via SUBLINGUAL
  Filled 2011-09-13: qty 25

## 2011-09-13 MED ORDER — ASPIRIN EC 81 MG PO TBEC
81.0000 mg | DELAYED_RELEASE_TABLET | Freq: Every day | ORAL | Status: DC
  Administered 2011-09-13 – 2011-09-14 (×2): 81 mg via ORAL
  Filled 2011-09-13 (×2): qty 1

## 2011-09-13 MED ORDER — NITROGLYCERIN 0.4 MG SL SUBL
0.4000 mg | SUBLINGUAL_TABLET | SUBLINGUAL | Status: DC | PRN
Start: 2011-09-13 — End: 2011-09-14

## 2011-09-13 MED ORDER — LISINOPRIL 20 MG PO TABS
20.0000 mg | ORAL_TABLET | Freq: Every day | ORAL | Status: DC
  Administered 2011-09-13 – 2011-09-14 (×2): 20 mg via ORAL
  Filled 2011-09-13 (×2): qty 1

## 2011-09-13 MED ORDER — ASPIRIN 81 MG PO CHEW
324.0000 mg | CHEWABLE_TABLET | Freq: Once | ORAL | Status: AC
Start: 2011-09-13 — End: 2011-09-13
  Administered 2011-09-13: 324 mg via ORAL
  Filled 2011-09-13: qty 4

## 2011-09-13 MED ORDER — HEPARIN SODIUM (PORCINE) 5000 UNIT/ML IJ SOLN
5000.0000 [IU] | Freq: Three times a day (TID) | INTRAMUSCULAR | Status: DC
  Administered 2011-09-13 – 2011-09-14 (×2): 5000 [IU] via SUBCUTANEOUS
  Filled 2011-09-13 (×5): qty 1

## 2011-09-13 MED ORDER — SIMVASTATIN 20 MG PO TABS
20.0000 mg | ORAL_TABLET | Freq: Every day | ORAL | Status: DC
  Filled 2011-09-13: qty 1

## 2011-09-13 MED ORDER — HYDRALAZINE HCL 20 MG/ML IJ SOLN
5.0000 mg | Freq: Four times a day (QID) | INTRAMUSCULAR | Status: DC | PRN
  Filled 2011-09-13: qty 0.25

## 2011-09-13 NOTE — ED Notes (Signed)
Report given to Angie, RN with [facility].

## 2011-09-13 NOTE — ED Notes (Signed)
Pt in from home with chest pain states onset yesterday states intermittent spasm of pain from left shoulder to arm denies sob states some nausea hx of pace maker, stroke,

## 2011-09-13 NOTE — H&P (Signed)
Hospital Admission Note Date: 09/13/2011  Patient name: [name] Medical record number: [id number] Date of birth: 01-Jun-1950 Age: 61 y.o. Gender: male PCP: Craig Mage, MD  Medical Service:  Attending physician:  Dr. Josem Kaufmann    1st Contact: Dr. Sherrine Maples   Pager: [phone] 2nd Contact: Dr. Allena Katz   Pager: [phone] After 5 pm or weekends: 1st Contact:      Pager: [phone] 2nd Contact:      Pager: [phone]  Chief Complaint: Intermittent CP  History of Present Illness: [name] describes that on 09/10/11 he ran out of his medication and has not been with it until today. Today he began experiencing intermittent CP that was very sharp in nature starting in the front of his chest 45th intercostal space and radiating to his back not down his arms or into his neck. The pain lasts about 1-2 sec and then the frequency was about every 25-30 minutes. He was not having any CP during our interview. He denied any SOB or diaphoresis during the episodes. He has a fear of anything happening "on the left side of his body," and that is what brought him in. He was a bit nauseous on his admission to [facility] in [place], [place]. He states he has a history of stroke in 2010 and a pacemaker in 2011 that he says was 2/2 a "slow rhythm" that left him dizzy and lightheaded. He says that he has been unable to f/u on his appts for pacemaker interrogations because of his insurance. He is familiar with the sensation of the pacer firing and says that it happened frequently especially in church while sitting next to the organ, but he has noticed that lately it has been firing less. He does endorse that the sharp pains he is currently having is worse in intensity than the firing sensation of his pacer. His wife states that 3 days ago he lifted a very heavy TV that was "beyond his ability to carry alone."  The only other things he did mention was an unintentional WL of about 6 lbs over about the last couple of years. He feels  that this is secondary to a decrease in appetite that he has noticed coinciding with the weight loss. His wife didn't appear to feel this was true. Looking back at his history he does have weight loss since January but 2/2 unstable weight gain and weight loss overall.   At [facility] ED, CE, CBC, Bmet, EKG and CXR were completed before transfer to [facility].   Meds: Lisinopril 20mg  Pravastatin 40mg  ASA 325mg   Allergies: Allergies as of 09/13/2011  . (No Known Allergies)   Past Medical History  Diagnosis Date  . Tuberculosis at age 4-3  . Sickle cell trait   . Stroke   . Hypertension    Past Surgical History  Procedure Date  . Appendectomy 1977  . Vasectomy 1990   Family History  Problem Relation Age of Onset  . Cancer Sister   . Sickle cell anemia Son    History   Social History  . Marital Status: Married    Spouse Name: N/A    Number of Children: N/A  . Years of Education: N/A   Occupational History  . unemployed    Social History Main Topics  . Smoking status: Current Everyday Smoker -- 0.2 packs/day for 10 years    Types: Cigarettes  . Smokeless tobacco: Not on file  . Alcohol Use: No  . Drug Use: No  .  Sexually Active: Not on file   Other Topics Concern  . Not on file   Social History Narrative   Lives at home with wife and step daughter.    Review of Systems: Pertinent items are noted in HPI.  Physical Exam: Blood pressure 166/104, pulse 60, temperature 97.9 F (36.6 C), temperature source Oral, resp. rate 18, height 6\' 1"  (1.854 m), weight 72.122 kg (159 lb), SpO2 98.00%. General: resting in bed comfortably HEENT: PERRL, EOMI, no scleral icterus, senile nuclear sclerosis Cardiac: RRR, nrml S1, S2, no S3/S4,no rubs, murmurs or gallops Pulm: clear to auscultation bilaterally, moving normal volumes of air, no tenderness 2/2 palpation of ICS, pacer site not warm or erythematous Abd: soft, nontender, nondistended, BS present Ext: warm and  well perfused, no pedal edema Neuro: alert and oriented X3, cranial nerves II-XII grossly intact   Lab results: Basic Metabolic Panel:  Basename 09/13/11 1315  NA 139  K 4.1  CL 103  CO2 27  GLUCOSE 88  BUN 9  CREATININE 0.91  CALCIUM 9.5  MG --  PHOS --   CBC:  Basename 09/13/11 1315  WBC 4.2  NEUTROABS --  HGB 13.1  HCT 37.5*  MCV 83.1  PLT 187   Cardiac Enzymes:  Basename 09/13/11 1230  CKTOTAL 103  CKMB 1.3  CKMBINDEX --  TROPONINI <0.30   BNP: No results found for this basename: PROBNP:3 in the last 72 hours D-Dimer:  [facility] 09/13/11 1315  DDIMER 0.48    Imaging results:  Dg Chest 2 View  09/13/2011  *RADIOLOGY REPORT*  Clinical Data: Smoker presenting with chest pain.  CHEST - 2 VIEW  Comparison: Two-view chest x-ray 07/23/2011, 01/12/2009, 04/25/2006.  Findings: Marked pulmonary hyperinflation with emphysematous changes throughout both lungs.  Pleuroparenchymal scarring in the left apex.  Parenchymal scarring in the upper lobes bilaterally, unchanged.  No new pulmonary parenchymal abnormalities.  Cardiac silhouette normal in size.  Left subclavian dual lead transvenous pacemaker unchanged and intact.  Elongation of the thoracic aorta due to the hyperinflation.  Hilar and mediastinal contours otherwise unremarkable.  Mild degenerative changes involving the mid thoracic spine.  No significant interval change.  IMPRESSION: Severe COPD/emphysema.  Pleuroparenchymal scarring in the left apex and parenchymal scarring in the upper lobes bilaterally.  No acute cardiopulmonary disease.  Original Report Authenticated By: Arnell Sieving, M.D.    Other results: EKG: normal EKG, normal sinus rhythm, unchanged from previous tracings, pacer spikes not visible on current EKG.  Assessment & Plan by Problem: 1.Chest pain - Pt does have several episodes of several seconds of pain that is sharp. Will rule out ACS with CE times 3. First set negative. Patient is low risk  for PE with wells score of 0. Not typical for dissection and CXR without mediastinal widening and pain not sustained.   -CEx3 -Will call electrophysiologist tomorrow to evaluate pacer as it has been going off a lot lately per patient and has not been evaluated since 2011.  -Repeat EKG in AM.  -Ntiro prn for pain and will have nurse call if chest pain is sustained and will order morphine at that time.       2.Hypertension - Has been out of BP medicine lisinopril since Friday and will restart. This is likely cause of his hypertension on admission. Will follow and adjust regimen as needed.        3.Hyperlipidemia -  last FLP 6/13 with LDL 154 pt reports non-compliance due to not refilling meds -zocor 20  mg      4.Tobacco Abuse - Pt endorses that he is still a current smoker with questionable increase in smoking habit per wife.  - Smoking cessation counseling ordered.   5. DVT ppx - Heparin Tuttle TID 5000 units       Signed: Christen Bame 09/13/2011, 9:36 PM    I, Nada Boozer, FNP-C was asked to consult on pt.  While reviewing the chart I inadvertently, doubled click on this note.  I could not leave the note until I signed the note though I had not typed any notes until now.  I never saw the patient- he left the hospital AMA before my visit.

## 2011-09-13 NOTE — ED Notes (Signed)
[facility] called for transport to [facility], room #2019, attempted to call report to 2000, RN unavailable.

## 2011-09-13 NOTE — ED Provider Notes (Signed)
History     CSN: [id number]  Arrival date & time 09/13/11  1143   First MD Initiated Contact with Patient 09/13/11 1210      Chief Complaint  Patient presents with  . Chest Pain    (Consider location/radiation/quality/duration/timing/severity/associated sxs/prior treatment) HPI Pt presents with c/o left shoulder and left upper chest pain.  He states pain began yesterday and today became worse.  No fever/, no cough, no leg swelling.  Has not had similar pain in the past.  Has pacemaker in place in left upper chest- no pain around pacemaker site.  No sob, no dizziness or fainting.  No diaphoresis, no nausea.  Pain is not associated with exertion.  There are no other associated systemic symptoms, there are no other alleviating or modifying factors.   Past Medical History  Diagnosis Date  . Tuberculosis at age 30-3  . Sickle cell trait   . Stroke   . Hypertension     Past Surgical History  Procedure Date  . Appendectomy 1977  . Vasectomy 1990    Family History  Problem Relation Age of Onset  . Cancer Sister   . Sickle cell anemia Son     History  Substance Use Topics  . Smoking status: Current Everyday Smoker -- 0.2 packs/day for 10 years    Types: Cigarettes  . Smokeless tobacco: Not on file  . Alcohol Use: No      Review of Systems ROS reviewed and all otherwise negative except for mentioned in HPI  Allergies  Review of patient's allergies indicates no known allergies.  Home Medications   Current Outpatient Rx  Name Route Sig Dispense Refill  . ASPIRIN 325 MG PO TABS Oral Take 1 tablet (325 mg total) by mouth daily. 100 tablet 11  . LISINOPRIL 20 MG PO TABS Oral Take 1 tablet (20 mg total) by mouth daily. 30 tablet 3  . PRAVASTATIN SODIUM 40 MG PO TABS Oral Take 1 tablet (40 mg total) by mouth every evening. 30 tablet 11    BP 132/87  Pulse 69  Temp 98.3 F (36.8 C) (Oral)  Resp 17  Ht 6\' 1"  (1.854 m)  Wt 159 lb (72.122 kg)  BMI 20.98 kg/m2  SpO2  100% Vitals reviewed Physical Exam Physical Examination: General appearance - alert, well appearing, and in no distress Mental status - alert, oriented to person, place, and time Eyes - pupils equal and reactive, no conjunctival injection, no scleral icterus Mouth - mucous membranes moist, pharynx normal without lesions Chest - clear to auscultation, no wheezes, rales or rhonchi, symmetric air entry, no tenderness or eythema around pacemaker site/pocket Heart - normal rate, regular rhythm, normal S1, S2, no murmurs, rubs, clicks or gallops Abdomen - soft, nontender, nondistended, no masses or organomegaly Musculoskeletal - no joint tenderness, deformity or swelling Extremities - peripheral pulses normal, no pedal edema, no clubbing or cyanosis Neuro- cranial nerves grossly intact, strength 5/5 in extremities x 4, sensation intact Skin - normal coloration and turgor, no rashes  ED Course  Procedures (including critical care time)   Date: 09/13/2011  Rate: 65  Rhythm: normal sinus rhythm  QRS Axis: normal  Intervals: normal  ST/T Wave abnormalities: nonspecific T wave changes- t wave flattening in lateral leads  Conduction Disutrbances:none  Narrative Interpretation:   Old EKG Reviewed: none available  3:13 PM  Discussed with OPC resident at [facility], pt to be transferred there for admission to telemetry bed.   Labs Reviewed  CBC -  Abnormal; Notable for the following:    HCT 37.5 (*)     All other components within normal limits  BASIC METABOLIC PANEL - Abnormal; Notable for the following:    GFR calc non Af Amer 89 (*)     All other components within normal limits  COMPREHENSIVE METABOLIC PANEL - Abnormal; Notable for the following:    Potassium 3.3 (*)     Glucose, Bld 126 (*)     Albumin 3.3 (*)     GFR calc non Af Amer 89 (*)     All other components within normal limits  CBC - Abnormal; Notable for the following:    Hemoglobin 12.4 (*)     HCT 35.4 (*)     All other  components within normal limits  CARDIAC PANEL(CRET KIN+CKTOT+MB+TROPI)  D-DIMER, QUANTITATIVE  CARDIAC PANEL(CRET KIN+CKTOT+MB+TROPI)  CARDIAC PANEL(CRET KIN+CKTOT+MB+TROPI)  TSH  LAB REPORT - SCANNED   Dg Chest 2 View  09/13/2011  *RADIOLOGY REPORT*  Clinical Data: Smoker presenting with chest pain.  CHEST - 2 VIEW  Comparison: Two-view chest x-ray 07/23/2011, 01/12/2009, 04/25/2006.  Findings: Marked pulmonary hyperinflation with emphysematous changes throughout both lungs.  Pleuroparenchymal scarring in the left apex.  Parenchymal scarring in the upper lobes bilaterally, unchanged.  No new pulmonary parenchymal abnormalities.  Cardiac silhouette normal in size.  Left subclavian dual lead transvenous pacemaker unchanged and intact.  Elongation of the thoracic aorta due to the hyperinflation.  Hilar and mediastinal contours otherwise unremarkable.  Mild degenerative changes involving the mid thoracic spine.  No significant interval change.  IMPRESSION: Severe COPD/emphysema.  Pleuroparenchymal scarring in the left apex and parenchymal scarring in the upper lobes bilaterally.  No acute cardiopulmonary disease.  Original Report Authenticated By: Arnell Sieving, M.D.     1. Chest pain   2. HYPERTENSION   3. HYPERLIPIDEMIA       MDM  Pt presents with c/o left sided chest pain. Has hx of prior CVA, HTN, hyperlipidemia.  Pt chest pain free in ED after nitroglycerin.  He was admitted to [facility] for further management.  Pt was advised about plan for admission and was agreeable        Ethelda Chick, MD 09/14/11 [phone]

## 2011-09-14 DIAGNOSIS — R079 Chest pain, unspecified: Secondary | ICD-10-CM | POA: Diagnosis not present

## 2011-09-14 LAB — COMPREHENSIVE METABOLIC PANEL
ALT: 12 U/L (ref 0–53)
AST: 18 U/L (ref 0–37)
Alkaline Phosphatase: 71 U/L (ref 39–117)
Calcium: 9.2 mg/dL (ref 8.4–10.5)
GFR calc Af Amer: >90 mL/min (ref >90)
Glucose, Bld: 126 mg/dL — ABNORMAL HIGH (ref 70–99)
Potassium: 3.3 mEq/L — ABNORMAL LOW (ref 3.5–5.1)
Sodium: 141 mEq/L (ref 135–145)
Total Protein: 6.1 g/dL (ref 6.0–8.3)

## 2011-09-14 LAB — CARDIAC PANEL(CRET KIN+CKTOT+MB+TROPI)
CK, MB: 1.4 ng/mL (ref 0.3–4.0)
Relative Index: INVALID (ref 0.0–2.5)
Total CK: 81 U/L (ref 7–232)

## 2011-09-14 LAB — CBC
HCT: 35.4 % — ABNORMAL LOW (ref 39.0–52.0)
Hemoglobin: 12.4 g/dL — ABNORMAL LOW (ref 13.0–17.0)
MCH: 28.8 pg (ref 26.0–34.0)
MCHC: 35 g/dL (ref 30.0–36.0)
MCV: 82.3 fL (ref 78.0–100.0)
RDW: 14.8 % (ref 11.5–15.5)

## 2011-09-14 NOTE — Progress Notes (Signed)
Utilization review complete 

## 2011-09-14 NOTE — Discharge Summary (Signed)
*Pt left AMA*  Internal Medicine Teaching [facility] Discharge Note  Name: [name] MRN: [id number] DOB: January 22, 1951 61 y.o.  Date of Admission: 09/13/2011 11:44 AM Date of Discharge: 09/14/2011 Attending Physician: Dr. Josem Reyes  Discharge Diagnosis: Principal Problem:  *Chest pain Active Problems:  HYPERLIPIDEMIA  HYPERTENSION  Smoking   Discharge Medications: Medication List  As of 09/14/2011  2:54 PM   ASK your doctor about these medications         aspirin 325 MG tablet   Take 1 tablet (325 mg total) by mouth daily.      lisinopril 20 MG tablet   Commonly known as: PRINIVIL,ZESTRIL   Take 1 tablet (20 mg total) by mouth daily.      pravastatin 40 MG tablet   Commonly known as: PRAVACHOL   Take 1 tablet (40 mg total) by mouth every evening.            Disposition and follow-up:   [name] left [facility] AMA    Consultations: Cardiology  Procedures Performed:  Dg Chest 2 View  09/13/2011  *RADIOLOGY REPORT*  Clinical Data: Smoker presenting with chest pain.  CHEST - 2 VIEW  Comparison: Two-view chest x-ray 07/23/2011, 01/12/2009, 04/25/2006.  Findings: Marked pulmonary hyperinflation with emphysematous changes throughout both lungs.  Pleuroparenchymal scarring in the left apex.  Parenchymal scarring in the upper lobes bilaterally, unchanged.  No new pulmonary parenchymal abnormalities.  Cardiac silhouette normal in size.  Left subclavian dual lead transvenous pacemaker unchanged and intact.  Elongation of the thoracic aorta due to the hyperinflation.  Hilar and mediastinal contours otherwise unremarkable.  Mild degenerative changes involving the mid thoracic spine.  No significant interval change.  IMPRESSION: Severe COPD/emphysema.  Pleuroparenchymal scarring in the left apex and parenchymal scarring in the upper lobes bilaterally.  No acute cardiopulmonary disease.  Original Report Authenticated By: [name], M.D.     Admission HPI: History of Present Illness: [name] describes that on 09/10/11 he ran out of his medication and has not been with it until today. Today he began experiencing intermittent CP that was very sharp in nature starting in the front of his chest 45th intercostal space and radiating to his back not down his arms or into his neck. The pain lasts about 1-2 sec and then the frequency was about every 25-30 minutes. He was not having any CP during our interview. He denied any SOB or diaphoresis during the episodes. He has a fear of anything happening "on the left side of his body," and that is what brought him in. He was a bit nauseous on his admission to [name] in [place], [place]. He states he has a history of stroke in 2010 and a pacemaker in 2011 that he says was 2/2 a "slow rhythm" that left him dizzy and lightheaded. He says that he has been unable to f/u on his appts for pacemaker interrogations because of his insurance. He is familiar with the sensation of the pacer firing and says that it happened frequently especially in church while sitting next to the organ, but he has noticed that lately it has been firing less. He does endorse that the sharp pains he is currently having is worse in intensity than the firing sensation of his pacer. His wife states that 3 days ago he lifted a very heavy TV that was "beyond his ability to carry alone." The only other things he did mention was an unintentional WL of  about 6 lbs over about the last couple of years. He feels that this is secondary to a decrease in appetite that he has noticed coinciding with the weight loss. His wife didn't appear to feel this was true. Looking back at his history he does have weight loss since January but 2/2 unstable weight gain and weight loss overall.  At [facility] ED, CE, CBC, Bmet, EKG and CXR were completed before transfer to [facility].  Physical Exam:  Blood pressure 166/104, pulse 60, temperature  97.9 F (36.6 C), temperature source Oral, resp. rate 18, height 6\' 1"  (1.854 m), weight 72.122 kg (159 lb), SpO2 98.00%.  General: resting in bed comfortably  HEENT: PERRL, EOMI, no scleral icterus, senile nuclear sclerosis  Cardiac: RRR, nrml S1, S2, no S3/S4,no rubs, murmurs or gallops  Pulm: clear to auscultation bilaterally, moving normal volumes of air, no tenderness 2/2 palpation of ICS, pacer site not warm or erythematous  Abd: soft, nontender, nondistended, BS present  Ext: warm and well perfused, no pedal edema  Neuro: alert and oriented X3, cranial nerves II-XII grossly intact  Lab results:  Basic Metabolic Panel:   Basename  09/13/11 1315   NA  139   K  4.1   CL  103   CO2  27   GLUCOSE  88   BUN  9   CREATININE  0.91   CALCIUM  9.5   MG  --   PHOS  --    CBC:   Basename  09/13/11 1315   WBC  4.2   NEUTROABS  --   HGB  13.1   HCT  37.5*   MCV  83.1   PLT  187    Cardiac Enzymes:   Basename  09/13/11 1230   CKTOTAL  103   CKMB  1.3   CKMBINDEX  --   TROPONINI  <0.30    BNP:  No results found for this basename: PROBNP:3 in the last 72 hours  D-Dimer:   [facility]  09/13/11 1315   DDIMER  0.48      [facility] Course by problem list: 61yo M, with a hx of HTN, CVA, and ICD placement admitted for intermittant left sided chest pains.   1.Chest pain - Having intermittent, sharp left sided chest pain. Cardiac enzymes were obtained x3 and remained negative. CXR was performed and showed severe COPD/emphysema, no acute CV disease. EKGs remained unchanged. He endorsed that his pacer fires multiple times/day, and it has not been interrogated since it was placed in 2011. The Cardiology PA's, [name] (contacted by me) and [name] (contacted by the pt's nurse) were contacted and asked to evaluate the pacer. The pt became very angry and stated that he was leaving and would f/u with his Cardiologist. [name] left AMA before his ICD could be interrogated.   2.Hypertension - His BP was elevated on admission, but was improved at last vitals check at 5am. Has was out of BP medicine lisinopril since Friday but was restarted on admission. Unfortunately, he left AMA, and a new prescription was unable to be given to Mr. Furr  3.Hyperlipidemia - Chronic illness for pt. Last FLP 6/13 with LDL 154 pt reports non-compliance due to not refilling meds. Zocor 20 mg was started, but due to his leaving AMA, no prescription was able to be given.  4.Tobacco Abuse - Pt endorses that he is a current smoker with questionable increase in smoking habit per wife. CXR showed severe COPD/emphysema. Smoking  cessation was ordered on admission.   Discharge Vitals:  BP 132/87  Pulse 69  Temp 98.3 F (36.8 C) (Oral)  Resp 17  Ht 6\' 1"  (1.854 m)  Wt 159 lb (72.122 kg)  BMI 20.98 kg/m2  SpO2 100%  Discharge Labs:  Results for orders placed during the [facility] encounter of 09/13/11 (from the past 24 hour(s))  CARDIAC PANEL(CRET KIN+CKTOT+MB+TROPI)     Status: Normal   Collection Time   09/13/11  9:22 PM      Component Value Range   Total CK 100  7 - 232 U/L   CK, MB 1.5  0.3 - 4.0 ng/mL   Troponin I <0.30  <0.30 ng/mL   Relative Index 1.5  0.0 - 2.5  TSH     Status: Normal   Collection Time   09/13/11  9:22 PM      Component Value Range   TSH 1.388  0.350 - 4.500 uIU/mL  CARDIAC PANEL(CRET KIN+CKTOT+MB+TROPI)     Status: Normal   Collection Time   09/14/11  6:08 AM      Component Value Range   Total CK 81  7 - 232 U/L   CK, MB 1.4  0.3 - 4.0 ng/mL   Troponin I <0.30  <0.30 ng/mL   Relative Index RELATIVE INDEX IS INVALID  0.0 - 2.5  COMPREHENSIVE METABOLIC PANEL     Status: Abnormal   Collection Time   09/14/11  6:08 AM      Component Value Range   Sodium 141  135 - 145 mEq/L   Potassium 3.3 (*) 3.5 - 5.1 mEq/L   Chloride 104  96 - 112 mEq/L   CO2 26  19 - 32 mEq/L   Glucose, Bld 126 (*) 70 - 99 mg/dL   BUN 12  6 - 23 mg/dL   Creatinine, Ser 4.09  0.50 - 1.35  mg/dL   Calcium 9.2  8.4 - 81.1 mg/dL   Total Protein 6.1  6.0 - 8.3 g/dL   Albumin 3.3 (*) 3.5 - 5.2 g/dL   AST 18  0 - 37 U/L   ALT 12  0 - 53 U/L   Alkaline Phosphatase 71  39 - 117 U/L   Total Bilirubin 0.4  0.3 - 1.2 mg/dL   GFR calc non Af Amer 89 (*) >90 mL/min   GFR calc Af Amer >90  >90 mL/min  CBC     Status: Abnormal   Collection Time   09/14/11  6:08 AM      Component Value Range   WBC 4.1  4.0 - 10.5 K/uL   RBC 4.30  4.22 - 5.81 MIL/uL   Hemoglobin 12.4 (*) 13.0 - 17.0 g/dL   HCT 91.4 (*) 78.2 - 95.6 %   MCV 82.3  78.0 - 100.0 fL   MCH 28.8  26.0 - 34.0 pg   MCHC 35.0  30.0 - 36.0 g/dL   RDW 21.3  08.6 - 57.8 %   Platelets 167  150 - 400 K/uL    Signed: Genelle Gather 09/14/2011, 2:54 PM   Time Spent on Discharge: 25

## 2011-09-14 NOTE — H&P (Signed)
I discussed Mr. [name]' history, physical, labs, and X-rays with Dr. Burtis Junes and I agree with her assessment and plan as documented in her admission history and physical.  Mr. Manke left against medical advice before being seen by the attending.

## 2011-09-14 NOTE — Progress Notes (Signed)
Dwain called from desk to say pt in 2019 wanted box off and IV out he was leaving.  I spoke with pt and he stated he was told at 8:00 am that he needed his pacemaker checked.  He said he was not waiting around all day that he and his wife were going to separate if he did not get out of here.  He said the waiting around was causing his blood pressure to go up and causing friction between him and his wife.  I tried to calm pt and called Dr. Sherrine Maples who said she had called PA Franky Macho and did not want pt to leave.  I called Sherre Poot and Nada Boozer, NP.  While I was on the phone, Dr. Allena Katz came and spoke with pt and tried to get pt to stay for test. He explained that pt would be leaving AMA if he could not wait for test. Vernona Rieger said she was not aware of pacers needing interrogation until 2 hours ago.  She had already looked through chart and did not know what type pacer pt had.  She said she was on her way but if the pt could not wait it was within his right to leave AMA.  I explained to pt that we needed to know pacer type and he said he didn't know it was in his wallet and we should know because [facility] put the pacer in here.  I reiterated that we were trying to get him looked at as quickly as possible.  He said he did not care.  He would follow up with his doctor after he left. Thomas Hoff

## 2011-09-14 NOTE — Progress Notes (Signed)
Pt left AMA  Subjective: No overnight events. Pt very angry this morning. He says he is about to leave the hospital. By 12pm he said he was not waiting for his ICD to be interrogated, and left AMA. Objective: Vital signs in last 24 hours: Filed Vitals:   09/13/11 2008 09/13/11 2012 09/13/11 2226 09/14/11 0525  BP: 166/104  160/94 132/87  Pulse: 60   69  Temp: 97.9 F (36.6 C)   98.3 F (36.8 C)  TempSrc: Oral   Oral  Resp:    17  Height:  6\' 1"  (1.854 m)    Weight:  159 lb (72.122 kg)    SpO2: 98%   100%   Weight change:   Intake/Output Summary (Last 24 hours) at 09/14/11 1345 Last data filed at 09/14/11 1251  Gross per 24 hour  Intake    360 ml  Output    575 ml  Net   -215 ml   Vitals reviewed. General:Lying in bed, NAD, very angry HEENT: PERRL, EOMI, no scleral icterus Cardiac: RRR, no rubs, murmurs or gallops Pulm: Clear to auscultation bilaterally, no wheezes, rales, or rhonchi Abd: Soft, nontender, nondistended Ext: Warm and well perfused, no pedal edema Neuro: Alert and oriented X3, cranial nerves II-XII grossly intact  Lab Results: Basic Metabolic Panel:  Lab 09/14/11 1610 09/13/11 1315  NA 141 139  K 3.3* 4.1  CL 104 103  CO2 26 27  GLUCOSE 126* 88  BUN 12 9  CREATININE 0.90 0.91  CALCIUM 9.2 9.5  MG -- --  PHOS -- --   Liver Function Tests:  Lab 09/14/11 0608  AST 18  ALT 12  ALKPHOS 71  BILITOT 0.4  PROT 6.1  ALBUMIN 3.3*   CBC:  Lab 09/14/11 0608 09/13/11 1315  WBC 4.1 4.2  NEUTROABS -- --  HGB 12.4* 13.1  HCT 35.4* 37.5*  MCV 82.3 83.1  PLT 167 187   Cardiac Enzymes:  Lab 09/14/11 0608 09/13/11 2122 09/13/11 1230  CKTOTAL 81 100 103  CKMB 1.4 1.5 1.3  CKMBINDEX -- -- --  TROPONINI <0.30 <0.30 <0.30   D-Dimer:  Lab 09/13/11 1315  DDIMER 0.48   Thyroid Function Tests:  Lab 09/13/11 2122  TSH 1.388  T4TOTAL --  FREET4 --  T3FREE --  THYROIDAB --   Urine Drug Screen: Drugs of Abuse     Component Value Date/Time     LABOPIA NONE DETECTED 10/04/2008 0121   COCAINSCRNUR NONE DETECTED 10/04/2008 0121   LABBENZ NONE DETECTED 10/04/2008 0121   AMPHETMU NONE DETECTED 10/04/2008 0121   THCU NONE DETECTED 10/04/2008 0121   LABBARB  Value: NONE DETECTED        DRUG SCREEN FOR MEDICAL PURPOSES ONLY.  IF CONFIRMATION IS NEEDED FOR ANY PURPOSE, NOTIFY LAB WITHIN 5 DAYS.        LOWEST DETECTABLE LIMITS FOR URINE DRUG SCREEN Drug Class       Cutoff (ng/mL) Amphetamine      1000 Barbiturate      200 Benzodiazepine   200 Tricyclics       300 Opiates          300 Cocaine          300 THC              50 10/04/2008 0121    Studies/Results: Dg Chest 2 View  09/13/2011  *RADIOLOGY REPORT*  Clinical Data: Smoker presenting with chest pain.  CHEST - 2 VIEW  Comparison: Two-view chest  x-ray 07/23/2011, 01/12/2009, 04/25/2006.  Findings: Marked pulmonary hyperinflation with emphysematous changes throughout both lungs.  Pleuroparenchymal scarring in the left apex.  Parenchymal scarring in the upper lobes bilaterally, unchanged.  No new pulmonary parenchymal abnormalities.  Cardiac silhouette normal in size.  Left subclavian dual lead transvenous pacemaker unchanged and intact.  Elongation of the thoracic aorta due to the hyperinflation.  Hilar and mediastinal contours otherwise unremarkable.  Mild degenerative changes involving the mid thoracic spine.  No significant interval change.  IMPRESSION: Severe COPD/emphysema.  Pleuroparenchymal scarring in the left apex and parenchymal scarring in the upper lobes bilaterally.  No acute cardiopulmonary disease.  Original Report Authenticated By: Arnell Sieving, M.D.   Medications: I have reviewed the patient's current medications. Scheduled Meds:   . aspirin EC  81 mg Oral Daily  . heparin  5,000 Units Subcutaneous Q8H  . lisinopril  20 mg Oral Daily  . simvastatin  20 mg Oral q1800   Continuous Infusions:  PRN Meds:.hydrALAZINE, nitroGLYCERIN Assessment/Plan: 61yo M, with a hx of HTN,  CVA, and ICD placement admitted for intermittant left sided chest pains.   1.Chest pain - Having intermittent, sharp left sided chest pain. Cardiac enzymes were obtained x3 and remained negative. CXR was performed and showed severe COPD/emphysema, no acute CV disease. EKGs remained unchanged. He endorsed that his pacer fires multiple times/day, and it has not been interrogated since it was placed in 2011. The Cardiology PA's, Corine Shelter (contacted by me) and Nada Boozer (contacted by the pt's nurse) were contacted and asked to evaluate the pacer. The pt became very angry and stated that he was leaving and would f/u with his Cardiologist. Mr. Tomko left AMA before his ICD could be interrogated.   2.Hypertension - His BP was elevated on admission, but was improved at last vitals check at 5am. Has was out of BP medicine lisinopril since Friday but was restarted on admission. Unfortunately, he left AMA, and a new prescription was unable to be given to Mr. Sweetser  3.Hyperlipidemia - Chronic illness for pt. Last FLP 6/13 with LDL 154 pt reports non-compliance due to not refilling meds. Zocor 20 mg was started, but due to his leaving AMA, no prescription was able to be given.  4.Tobacco Abuse - Pt endorses that he is a current smoker with questionable increase in smoking habit per wife. CXR showed severe COPD/emphysema. Smoking cessation was ordered on admission.    LOS: 1 day   Genelle Gather 09/14/2011, 1:45 PM

## 2011-09-29 NOTE — Discharge Summary (Signed)
Please note it was his pacemaker, not an ICD, that we tried to have interrogated.  The patient left AMA prior to pacemaker interrogation or being seen by the attending physician.

## 2011-10-20 ENCOUNTER — Encounter: Payer: Self-pay | Admitting: Internal Medicine

## 2011-11-05 ENCOUNTER — Other Ambulatory Visit: Payer: Self-pay | Admitting: *Deleted

## 2011-11-05 DIAGNOSIS — I10 Essential (primary) hypertension: Secondary | ICD-10-CM

## 2011-11-06 MED ORDER — METOPROLOL TARTRATE 50 MG PO TABS: 50.0000 mg | ORAL_TABLET | Freq: Two times a day (BID) | ORAL | Status: DC

## 2011-11-18 ENCOUNTER — Encounter: Payer: Self-pay | Admitting: Internal Medicine

## 2011-11-18 DIAGNOSIS — J449 Chronic obstructive pulmonary disease, unspecified: Secondary | ICD-10-CM | POA: Insufficient documentation

## 2011-11-25 ENCOUNTER — Encounter: Payer: Self-pay | Admitting: Internal Medicine

## 2011-11-25 ENCOUNTER — Ambulatory Visit (INDEPENDENT_AMBULATORY_CARE_PROVIDER_SITE_OTHER): Payer: Medicare Other | Admitting: Internal Medicine

## 2011-11-25 VITALS — BP 170/111 | HR 80 | Temp 97.2°F | Ht 73.25 in | Wt 165.7 lb

## 2011-11-25 DIAGNOSIS — E876 Hypokalemia: Secondary | ICD-10-CM | POA: Diagnosis not present

## 2011-11-25 DIAGNOSIS — H6980 Other specified disorders of Eustachian tube, unspecified ear: Secondary | ICD-10-CM | POA: Diagnosis not present

## 2011-11-25 DIAGNOSIS — Z950 Presence of cardiac pacemaker: Secondary | ICD-10-CM | POA: Diagnosis not present

## 2011-11-25 DIAGNOSIS — Z23 Encounter for immunization: Secondary | ICD-10-CM | POA: Diagnosis not present

## 2011-11-25 DIAGNOSIS — Z299 Encounter for prophylactic measures, unspecified: Secondary | ICD-10-CM | POA: Diagnosis not present

## 2011-11-25 DIAGNOSIS — I10 Essential (primary) hypertension: Secondary | ICD-10-CM

## 2011-11-25 DIAGNOSIS — R739 Hyperglycemia, unspecified: Secondary | ICD-10-CM

## 2011-11-25 DIAGNOSIS — J438 Other emphysema: Secondary | ICD-10-CM | POA: Diagnosis not present

## 2011-11-25 DIAGNOSIS — R7309 Other abnormal glucose: Secondary | ICD-10-CM | POA: Diagnosis not present

## 2011-11-25 DIAGNOSIS — E785 Hyperlipidemia, unspecified: Secondary | ICD-10-CM | POA: Diagnosis not present

## 2011-11-25 DIAGNOSIS — J439 Emphysema, unspecified: Secondary | ICD-10-CM

## 2011-11-25 DIAGNOSIS — Z0000 Encounter for general adult medical examination without abnormal findings: Secondary | ICD-10-CM | POA: Diagnosis not present

## 2011-11-25 LAB — LIPID PANEL
HDL: 49 mg/dL (ref >39)
LDL Cholesterol: 87 mg/dL (ref 0–99)
Total CHOL/HDL Ratio: 3.1 Ratio
VLDL: 16 mg/dL (ref 0–40)

## 2011-11-25 LAB — BASIC METABOLIC PANEL WITH GFR
BUN: 8 mg/dL (ref 6–23)
CO2: 31 mEq/L (ref 19–32)
Calcium: 9.3 mg/dL (ref 8.4–10.5)
Creat: 0.97 mg/dL (ref 0.50–1.35)
GFR, Est African American: >89 mL/min
Glucose, Bld: 82 mg/dL (ref 70–99)

## 2011-11-25 LAB — HEMOGLOBIN A1C: Mean Plasma Glucose: 105 mg/dL (ref <117)

## 2011-11-25 MED ORDER — METOPROLOL TARTRATE 50 MG PO TABS: 50.0000 mg | ORAL_TABLET | Freq: Two times a day (BID) | ORAL | Status: DC

## 2011-11-25 MED ORDER — GUAIFENESIN ER 600 MG PO TB12: 600.0000 mg | ORAL_TABLET | Freq: Two times a day (BID) | ORAL | Status: DC | PRN

## 2011-11-25 MED ORDER — LISINOPRIL 20 MG PO TABS: 20.0000 mg | ORAL_TABLET | Freq: Every day | ORAL | Status: DC

## 2011-11-25 NOTE — Assessment & Plan Note (Signed)
Seen on multiple CXRs. Pt never with baseline PFTs. Endorses dyspnea with exertion. Sending pt for PFTs to assess respiratory status.

## 2011-11-25 NOTE — Progress Notes (Signed)
Patient ID: [name], male   DOB: [birth year], 61 y.o.   MRN: [id number]  Subjective:   Patient ID: [name] male   DOB: 14-Jun-1950 61 y.o.   MRN: [id number]  HPI: [name] is a 61 y.o. male w/ PMH HTN, HLD, CVA 10/09/08, pacemaker placement 2011 2/2 symptomatic bradycardia, previous drug abuse, and COPD, who presents for a follow up after leaving the hospital AMA in July.  He was admitted to the hospital in July for chest pain. Cardiac enzymes were negative. He was to have his pacemaker interrogated b/c it had been over a year since its last interrogation, as the pt's Cardiologist, Dr. Lynnea Ferrier, has moved away from [place]. Unfortunately [name] left the hospital before it could be done. He denies any further chest pain since leaving the hospital. He is interested in seeing a new cardiologist at his pacemaker interrogated.  I does have a history of COPD and emphysema that has been seen on multiple chest x-rays although he has never had a pelvic function test. He's not using any inhalers currently. He states that his breathing is doing well but does endorse feeling winded with exercise. His history of crack cocaine use and marijuana use but quit in 2003. He also endorses smoking cigarettes but has cut back on his cigarette use now smoking 3-4 cigarettes a day. He states that he has been smoking cigarettes since the age of 61 years old.  He also complains today of pain in his neck just inferior to his ears had his eustachian tubes. He's been having this pain since 2010 he just figured that he would now mention it. He says at times he is able to swallow them and he feels that it opens up his eustachian tubes but then they fill back up and cause pain. He denies any seasonal allergies such as runny nose is congestion and sneezing but does endorse watery eyes.    Past Medical History  Diagnosis Date  . Tuberculosis at age 61-3  . Sickle cell trait   . Stroke 10/03/08   Right thalamus  . Hypertension     goal < 140/90  . Emphysema     per multiple CXRs  . Snoring     Sleep study 09/02/10 was WNL  . CEREBROVASCULAR ACCIDENT 10/17/2008    Acute CVA of right thalamus 10/03/08 ECHO performed 2/2 CVA 10/09/08, EF 55-65%  Aspirin 325 mg daily and try to work on quitting smoking.    Current Outpatient Prescriptions  Medication Sig Dispense Refill  . aspirin 325 MG tablet Take 1 tablet (325 mg total) by mouth daily.  100 tablet  11  . metoprolol (LOPRESSOR) 50 MG tablet Take 1 tablet (50 mg total) by mouth 2 (two) times daily.  60 tablet  6  . pravastatin (PRAVACHOL) 40 MG tablet Take 1 tablet (40 mg total) by mouth every evening.  30 tablet  11  . guaiFENesin (MUCINEX) 600 MG 12 hr tablet Take 1 tablet (600 mg total) by mouth 2 (two) times daily as needed for congestion.  60 tablet  2  . lisinopril (PRINIVIL,ZESTRIL) 20 MG tablet Take 1 tablet (20 mg total) by mouth daily.  30 tablet  6   Family History  Problem Relation Age of Onset  . Cancer Sister   . Sickle cell anemia Son    History   Social History  . Marital Status: Married    Spouse Name: N/A    Number of Children: N/A  .  Years of Education: N/A   Occupational History  . unemployed    Social History Main Topics  . Smoking status: Current Every Day Smoker -- 0.2 packs/day for 10 years    Types: Cigarettes  . Smokeless tobacco: None  . Alcohol Use: No  . Drug Use: No  . Sexually Active: None   Other Topics Concern  . None   Social History Narrative   Lives at home with wife and step daughter.   Review of Systems: Constitutional: Denies fever, chills, diaphoresis, appetite change and fatigue.  HEENT: Endorses watery eyes and pain/pressure in his neck, inferior to his ears bilaterally. Denies photophobia, eye pain, redness, hearing loss, ear pain, congestion, sore throat, rhinorrhea, sneezing, mouth sores, trouble swallowing, neck pain, neck stiffness and tinnitus.   Respiratory: Endorses  dyspnea and SOB w/ exertion.   Cardiovascular: Denies chest pain, palpitations and leg swelling.  Gastrointestinal: Denies nausea, vomiting, abdominal pain, diarrhea, constipation, blood in stool and abdominal distention.  Genitourinary: Denies dysuria, urgency, frequency, hematuria, flank pain and difficulty urinating.  Musculoskeletal: Denies myalgias, back pain, joint swelling, arthralgias and gait problem.  Skin: Denies pallor, rash and wound.  Neurological: Denies dizziness, seizures, syncope, weakness, light-headedness, numbness and headaches.  Psychiatric/Behavioral: Denies suicidal ideation, mood changes, confusion, nervousness, sleep disturbance and agitation  Objective:  Physical Exam: Filed Vitals:   11/25/11 1048  BP: 170/111  Pulse: 80  Temp: 97.2 F (36.2 C)  TempSrc: Oral  Height: 6' 1.25" (1.861 m)  Weight: 165 lb 11.2 oz (75.161 kg)  SpO2: 100%   Constitutional: Vital signs reviewed.  Patient is a well-developed and well-nourished male in no acute distress and cooperative with exam. Alert and oriented x3.  Head: Normocephalic and atraumatic Eyes: PERRL, EOMI, conjunctivae normal, no scleral icterus.  Neck: Supple, Trachea midline normal ROM, No JVD, mass, or thyromegaly.  Cardiovascular: RRR, no MRG, pulses symmetric and intact bilaterally Pulmonary/Chest: CTAB, no wheezes, rales, or rhonchi Abdominal: Soft. Non-tender, non-distended, bowel sounds are normal, no masses, organomegaly, or guarding present.  GU: No CVA tenderness Musculoskeletal: No joint deformities, erythema, or stiffness, ROM full and no nontender Hematology: No cervical adenopathy.  Neurological: A&O x3, Strength is normal and symmetric bilaterally, cranial nerve II-XII are grossly intact, no focal motor deficit, sensory intact to light touch bilaterally.  Skin: Warm, dry and intact. No rash, cyanosis, or clubbing.  Psychiatric: Normal mood and affect. Speech and behavior is normal. Judgment and  thought content appear normal. Cognition and memory appear normal.   Assessment & Plan:   Please refer to Problem List based A&P.

## 2011-11-25 NOTE — Assessment & Plan Note (Addendum)
K+ 3.3 7/13. Checking at clinic visit.  Potassium 4.8 on the day of his clinic visit.

## 2011-11-25 NOTE — Patient Instructions (Addendum)
Continue taking all medications as prescribed. Restart the Lisinopril 20mg  daily. Start Mucinex, one tablet twice daily as needed for congestion.  Follow up in 1 month and we will reassess you blood pressure control at that time.  A referral has been sent to Cardiology for follow up and to have your pacemaker interrogated.  An order has been placed for you to have a pulmonary function test performed, so we can have baseline pulmonary function.  We are checking labs today. I will notify you once they return if they are abnormal.    Cardiac Diet This diet can help prevent heart disease and stroke. Many factors influence your heart health, including eating and exercise habits. Coronary risk rises a lot with abnormal blood fat (lipid) levels. Cardiac meal planning includes limiting unhealthy fats, increasing healthy fats, and making other small dietary changes. General guidelines are as follows:  Adjust calorie intake to reach and maintain desirable body weight.   Limit total fat intake to less than 30% of total calories. Saturated fat should be less than 7% of calories.   Saturated fats are found in animal products and in some vegetable products. Saturated vegetable fats are found in coconut oil, cocoa butter, palm oil, and palm kernel oil. Read labels carefully to avoid these products as much as possible. Use butter in moderation. Choose tub margarines and oils that have 2 grams of fat or less. Good cooking oils are canola and olive oils.   Practice low-fat cooking techniques. Do not fry food. Instead, broil, bake, boil, steam, grill, roast on a rack, stir-fry, or microwave it. Other fat reducing suggestions include:   Remove the skin from poultry.   Remove all visible fat from meats.   Skim the fat off stews, soups, and gravies before serving them.   Steam vegetables in water or broth instead of sauting them in fat.   Avoid foods with trans fat (or hydrogenated oils), such as  commercially fried foods and commercially baked goods. Commercial shortening and deep-frying fats will contain trans fat.   Increase intake of fruits, vegetables, whole grains, and legumes to replace foods high in fat.   Increase consumption of nuts, legumes, and seeds to at least 4 servings weekly. One serving of a legume equals  cup, and 1 serving of nuts or seeds equals  cup.   Choose whole grains more often. Have 3 servings per day (a serving is 1 ounce [oz]).   Have at least 4 cups of fruit and vegetable a day.   Increase your intake of soluble fiber to 10 to 25 grams per day. Soluble fiber binds cholesterol to be removed from the blood. Foods high in soluble fiber are dried beans, citrus fruits, oats, apples, bananas, broccoli, Brussels sprouts, and eggplant.   Try to include foods fortified with plant sterols or stanols, such as yogurt, breads, juices, or margarines. Choose several fortified foods to achieve a daily intake of 2 to 3 grams of plant sterols or stanols.   Foods with omega-3 fats can help reduce your risk of heart disease. Aim to have a 3.5 oz portion of fatty fish twice per week, such as salmon, mackerel, albacore tuna, sardines, lake trout, or herring. If you wish to take a fish oil supplement, choose one that contains 1 gram of both DHA and EPA.   Limit processed meats to 2 servings (3 oz portion) weekly.   Limit the sodium in your diet to 1500 milligrams (mg) per day. If you have high  blood pressure, talk to a registered dietitian about a DASH (Dietary Approaches to Stop Hypertension) eating plan.   Limit beverages with added sugar, such as soda, to no more than 36 ounces per week.  CHOOSING FOODS Starches  Allowed: Breads: All kinds (wheat, rye, raisin, white, oatmeal, [place], [place], and English muffin bread). Low-fat rolls: English muffins, frankfurter and hamburger buns, bagels, pita bread, tortillas (not fried). Pancakes, waffles, biscuits, and muffins made  with recommended oil.   Avoid: Products made with saturated or trans fats, oils, or whole milk products. Butter rolls, cheese breads, croissants. Commercial doughnuts, muffins, sweet rolls, biscuits, waffles, pancakes, store-bought mixes.  Crackers  Allowed: Low-fat crackers and snacks: Animal, graham, rye, saltine (with recommended oil, no lard), oyster, and matzo crackers. Bread sticks, melba toast, rusks, flatbread, pretzels, and light popcorn.   Avoid: High-fat crackers: cheese crackers, butter crackers, and those made with coconut, palm oil, or trans fat (hydrogenated oils). Buttered popcorn.  Cereals  Allowed: Hot or cold whole-grain cereals.   Avoid: Cereals containing coconut, hydrogenated vegetable fat, or animal fat.  Potatoes / Pasta / Rice  Allowed: All kinds of potatoes, rice, and pasta (such as macaroni, spaghetti, and noodles).   Avoid: Pasta or rice prepared with cream sauce or high-fat cheese. Chow mein noodles, [place] fries.  Vegetables  Allowed: All vegetables and vegetable juices.   Avoid: Fried vegetables. Vegetables in cream, butter, or high-fat cheese sauces. Limit coconut. Fruit in cream or custard.  Meat and Meat Substitutes  Allowed: Limit your intake of meat, seafood, and poultry to no more than 6 oz (cooked weight) per day. All lean, well-trimmed beef, veal, pork, and lamb. All chicken and [place] without skin. All fish and shellfish. Wild game: wild duck, rabbit, pheasant, and venison. Meatless dishes: recipes with dried beans, peas, lentils, and tofu (soybean curd). Seeds and nuts: all seeds and most nuts.   Avoid: Prime grade and other heavily marbled and fatty meats, such as short ribs, spare ribs, rib eye roast or steak, frankfurters, sausage, bacon, and high-fat luncheon meats, mutton. Caviar. Commercially fried fish. Domestic duck, goose, venison sausage. Organ meats: liver, gizzard, heart, chitterlings, brains, kidney, sweetbreads.  Dairy  Allowed: Egg  whites or low-cholesterol egg substitutes may be used as desired. Low-fat cheeses: nonfat or low-fat cottage cheese (1% or 2% fat), cheeses made with part skim milk, such as mozzarella, farmers, string, or ricotta. (Cheeses should be labeled no more than 2 to 6 grams fat per oz.)   Avoid: Whole milk cheeses, including colby, cheddar, [place], [address], [place], [place], [place], [address], Swiss, and blue. Creamed cottage cheese, cream cheese.  Milk  Allowed: Skim (or 1%) milk: liquid, powdered, or evaporated. Buttermilk made with low-fat milk. Drinks made with skim or low-fat milk or cocoa. Chocolate milk or cocoa made with skim or low-fat (1%) milk. Nonfat or low-fat yogurt.   Avoid: Whole milk and whole milk products, including buttermilk or yogurt made from whole milk, drinks made from whole milk. Condensed milk, evaporated whole milk, and 2% milk.  Soups and Combination Foods  Allowed: Low-fat low-sodium soups: broth, dehydrated soups, homemade broth, soups with the fat removed, homemade cream soups made with skim or low-fat milk. Low-fat spaghetti, lasagna, chili, and Spanish rice if low-fat ingredients and low-fat cooking techniques are used.   Avoid: Cream soups made with whole milk, cream, or high-fat cheese. All other soups.  Desserts and Sweets  Allowed: Sherbet, fruit ices, gelatins, meringues, and angel food cake. Homemade desserts with  recommended fats, oils, and milk products. Jam, jelly, honey, marmalade, sugars, and syrups. Pure sugar candy, such as gum drops, hard candy, jelly beans, marshmallows, mints, and small amounts of dark chocolate.   Avoid: Commercially prepared cakes, pies, cookies, frosting, pudding, or mixes for these products. Desserts containing whole milk products, chocolate, coconut, lard, palm oil, or palm kernel oil. Ice cream or ice cream drinks. Candy that contains chocolate, coconut, butter, hydrogenated fat, or unknown ingredients. Buttered syrups.  Fats  and Oils  Allowed: Vegetable oils: safflower, sunflower, corn, soybean, cottonseed, sesame, canola, olive, or peanut. Non-hydrogenated margarines. Salad dressing or mayonnaise: homemade or commercial, made with a recommended oil. Low or nonfat salad dressing or mayonnaise.   Limit added fats and oils to 6 to 8 tsp per day (includes fats used in cooking, baking, salads, and spreads on bread). Remember to count the "hidden fats" in foods.   Avoid: Solid fats and shortenings: butter, lard, salt pork, bacon drippings. Gravy containing meat fat, shortening, or suet. Cocoa butter, coconut. Coconut oil, palm oil, palm kernel oil, or hydrogenated oils: these ingredients are often used in bakery products, nondairy creamers, whipped toppings, candy, and commercially fried foods. Read labels carefully. Salad dressings made of unknown oils, sour cream, or cheese, such as blue cheese and Roquefort. Cream, all kinds: half-and-half, light, heavy, or whipping. Sour cream or cream cheese (even if "light" or low-fat). Nondairy cream substitutes: coffee creamers and sour cream substitutes made with palm, palm kernel, hydrogenated oils, or coconut oil.  Beverages  Allowed: Coffee (regular or decaffeinated), tea. diet carbonated beverages, mineral water. Alcohol: Check with your caregiver. Moderation is recommended.   Avoid: Whole milk, regular sodas, and juice drinks with added sugar.  Condiments  Allowed: All seasonings and condiments. Cocoa powder. "Cream" sauces made with recommended ingredients.   Avoid: Carob powder made with hydrogenated fats.  SAMPLE MENU Breakfast   cup orange juice    cup oatmeal   1 slice toast   1 tsp margarine   1 cup skim milk  Lunch  [place] sandwich with 2 oz [place], 2 slices bread   Lettuce and tomato slices   Fresh fruit   Carrot sticks   Coffee or tea   Snack   Fresh fruit or low-fat crackers  Dinner  3 oz lean ground beef   1 baked potato   1 tsp  margarine    cup asparagus   Lettuce salad   1 tbs non-creamy dressing    cup peach slices   1 cup skim milk  Document Released: 12/03/2007 Document Revised: 02/12/2011 Document Reviewed: 02/25/2010 [facility] Patient Information 2012 [place], [place].

## 2011-11-26 DIAGNOSIS — R739 Hyperglycemia, unspecified: Secondary | ICD-10-CM | POA: Insufficient documentation

## 2011-11-26 DIAGNOSIS — H6990 Unspecified Eustachian tube disorder, unspecified ear: Secondary | ICD-10-CM | POA: Insufficient documentation

## 2011-11-26 DIAGNOSIS — H6980 Other specified disorders of Eustachian tube, unspecified ear: Secondary | ICD-10-CM | POA: Insufficient documentation

## 2011-11-26 DIAGNOSIS — Z0000 Encounter for general adult medical examination without abnormal findings: Secondary | ICD-10-CM | POA: Insufficient documentation

## 2011-11-26 NOTE — Assessment & Plan Note (Signed)
Patient with pacemaker that needs to be interrogated. Per patient last interrogation was in 2012. Referral placed for patient to see cardiology.

## 2011-11-26 NOTE — Assessment & Plan Note (Signed)
He is currently on Pravastatin. Which will be continued. A lipid panel was checked and looks very good: Total cholesterol 159, LDL 87, HDL 49, triglycerides 79. He's to continue Pravastatin 40 mg at night.

## 2011-11-26 NOTE — Assessment & Plan Note (Signed)
Blood glucose when the patient left AMA from the hospital was 126, so a CBG and A1c were checked at his clinic follow up. His CBG was 82 with an A1c of 5.3.

## 2011-11-26 NOTE — Progress Notes (Signed)
INTERNAL MEDICINE TEACHING ATTENDING ADDENDUM Lonzo Cloud , MD: I personally saw and evaluated Mr.Mans in this clinic visit in conjunction with the resident, Dr. Sherrine Maples. I have discussed the patient's plan of care with Dr. Sherrine Maples during this visit. I have confirmed the physical exam findings and have read and agree with the clinic note including the plan.

## 2011-11-26 NOTE — Assessment & Plan Note (Addendum)
Based on the patient's description, sounds like he has eustachian tube dysfunction. Will begin Mucinex 600 mg twice a day as needed.

## 2011-11-26 NOTE — Assessment & Plan Note (Signed)
BP elevated in the clinic to 170/111. He endorses poor compliance with the Lisinopril, but states that he will begin taking it regularly.  Continue taking Lisinopril 20mg  daily. Will reevaluate BP at [facility].

## 2011-11-30 NOTE — Progress Notes (Signed)
Pt aware of PFT 12/04/11 10AM at [facility]. Stanton Kidney Lounell Schumacher RN 11/30/11 4:45PM

## 2011-12-04 ENCOUNTER — Ambulatory Visit (HOSPITAL_COMMUNITY)
Admission: RE | Admit: 2011-12-04 | Discharge: 2011-12-04 | Disposition: A | Discharge status: Home / Self Care | Payer: Medicare Other | Source: Ambulatory Visit | Attending: Internal Medicine | Admitting: Internal Medicine

## 2011-12-04 DIAGNOSIS — J439 Emphysema, unspecified: Secondary | ICD-10-CM

## 2011-12-04 DIAGNOSIS — J449 Chronic obstructive pulmonary disease, unspecified: Secondary | ICD-10-CM | POA: Insufficient documentation

## 2011-12-04 DIAGNOSIS — J4489 Other specified chronic obstructive pulmonary disease: Secondary | ICD-10-CM | POA: Insufficient documentation

## 2011-12-04 MED ORDER — ALBUTEROL SULFATE (5 MG/ML) 0.5% IN NEBU
2.5000 mg | INHALATION_SOLUTION | Freq: Once | RESPIRATORY_TRACT | Status: AC
  Administered 2011-12-04: 2.5 mg via RESPIRATORY_TRACT

## 2011-12-10 ENCOUNTER — Institutional Professional Consult (permissible substitution): Payer: Medicare Other | Admitting: Internal Medicine

## 2011-12-25 ENCOUNTER — Institutional Professional Consult (permissible substitution): Payer: Medicare Other | Admitting: Internal Medicine

## 2011-12-28 ENCOUNTER — Ambulatory Visit: Payer: Medicare Other | Admitting: Internal Medicine

## 2012-01-06 ENCOUNTER — Institutional Professional Consult (permissible substitution): Payer: Medicare Other | Admitting: Internal Medicine

## 2012-01-12 ENCOUNTER — Telehealth: Payer: Self-pay | Admitting: Internal Medicine

## 2012-01-12 NOTE — Telephone Encounter (Signed)
Records were received From [facility] Via Mail From [facility] Gave to [facility]  01/12/12/KM

## 2012-01-14 ENCOUNTER — Encounter: Payer: Self-pay | Admitting: Internal Medicine

## 2012-01-14 NOTE — Progress Notes (Signed)
Patient ID: [name], male   DOB: 1950/06/10, 61 y.o.   MRN: [id number]  This patient is a CHRONIC NO-SHOW PATIENT that has a history of HYPERTENSION.  Please make sure to address hypertension during his next clinic appointment, and intervene as appropriate.    Within the AVS, please incorporate the following smartphrase: .htntips   Pertinent Data: BP Readings from Last 3 Encounters:  11/25/11 170/111  09/14/11 132/87  07/30/11 123/88    BMI: Estimated Body mass index is 21.71 kg/(m^2) as calculated from the following:   Height as of 11/25/11: 6' 1.25"(1.861 m).   Weight as of 11/25/11: 165 lb 11.2 oz(75.161 kg).  Smoking History: History  Smoking status  . Current Every Day Smoker -- 0.2 packs/day for 10 years  . Types: Cigarettes  Smokeless tobacco  . Not on file    Last Basic Metabolic Panel:    Component Value Date/Time   [name] 140 11/25/2011 1211   K 4.3 11/25/2011 1211   CL 103 11/25/2011 1211   CO2 31 11/25/2011 1211   BUN 8 11/25/2011 1211   CREATININE 0.97 11/25/2011 1211   CREATININE 0.90 09/14/2011 0608   GLUCOSE 82 11/25/2011 1211   CALCIUM 9.3 11/25/2011 1211    Allergies: No Known Allergies

## 2012-01-21 ENCOUNTER — Encounter: Payer: Self-pay | Admitting: Internal Medicine

## 2012-01-21 ENCOUNTER — Ambulatory Visit (INDEPENDENT_AMBULATORY_CARE_PROVIDER_SITE_OTHER): Payer: Medicare Other | Admitting: Internal Medicine

## 2012-01-21 VITALS — BP 167/113 | HR 88 | Temp 97.5°F | Ht 73.25 in | Wt 164.6 lb

## 2012-01-21 DIAGNOSIS — J439 Emphysema, unspecified: Secondary | ICD-10-CM

## 2012-01-21 DIAGNOSIS — J438 Other emphysema: Secondary | ICD-10-CM

## 2012-01-21 DIAGNOSIS — E785 Hyperlipidemia, unspecified: Secondary | ICD-10-CM

## 2012-01-21 DIAGNOSIS — I10 Essential (primary) hypertension: Secondary | ICD-10-CM | POA: Diagnosis not present

## 2012-01-21 DIAGNOSIS — Z950 Presence of cardiac pacemaker: Secondary | ICD-10-CM

## 2012-01-21 DIAGNOSIS — F17200 Nicotine dependence, unspecified, uncomplicated: Secondary | ICD-10-CM | POA: Diagnosis not present

## 2012-01-21 MED ORDER — LISINOPRIL 20 MG PO TABS: 20.0000 mg | ORAL_TABLET | Freq: Every day | ORAL | Status: DC

## 2012-01-21 MED ORDER — METOPROLOL TARTRATE 50 MG PO TABS: 50.0000 mg | ORAL_TABLET | Freq: Two times a day (BID) | ORAL | Status: DC

## 2012-01-21 MED ORDER — PRAVASTATIN SODIUM 40 MG PO TABS: 40.0000 mg | ORAL_TABLET | Freq: Every evening | ORAL | Status: DC

## 2012-01-21 MED ORDER — ALBUTEROL SULFATE HFA 108 (90 BASE) MCG/ACT IN AERS: 2.0000 | INHALATION_SPRAY | Freq: Four times a day (QID) | RESPIRATORY_TRACT | Status: DC | PRN

## 2012-01-21 NOTE — Progress Notes (Signed)
Patient ID: [name], male   DOB: 07-03-1950, 61 y.o.   MRN: [id number]  Subjective:   Patient ID: [name] male   DOB: [birth year] 61 y.o.   MRN: [id number]  HPI: [name] is a 61 y.o. male w/ PMH HTN, HLD, mulitple CVA, symptomatic bradycardia w/ pacemaker, who presents for a routine follow up appt to check his blood pressure.  Today his BP is 167/113. He states that he has been taking his medications regularly but ran out 2 days ago. He denies any sx related to his elevated blood pressure. He states that usually when his BP is elevated he has headaches, but he has not experienced any since his last hospitalization. In speaking to his pharmacy, he last refilled his prescriptions on 10/2, so he should have been out of his medications for almost 2 weeks instead of 2 days.  He missed his Cardiology appointment to have his pacemaker interrrogated. This was originally supposed to be done in July during his hospitalization, but he left AMA.   He did have PFTs 12/04/11 which showed mild air flow limitation with significant improvement with bronchodilator use. He has used an Albuterol inhaler in the past, but dose not have one currently.    Past Medical History  Diagnosis Date  . Tuberculosis at age 37-3  . Sickle cell trait   . Stroke 10/03/08    Right thalamus  . Hypertension     goal < 140/90  . Emphysema     per multiple CXRs  . Snoring     Sleep study 09/02/10 was WNL  . CEREBROVASCULAR ACCIDENT 10/17/2008    Acute CVA of right thalamus 10/03/08 ECHO performed 2/2 CVA 10/09/08, EF 55-65%  Aspirin 325 mg daily and try to work on quitting smoking.    Current Outpatient Prescriptions  Medication Sig Dispense Refill  . aspirin 325 MG tablet Take 1 tablet (325 mg total) by mouth daily.  100 tablet  11  . lisinopril (PRINIVIL,ZESTRIL) 20 MG tablet Take 1 tablet (20 mg total) by mouth daily.  30 tablet  6  . metoprolol (LOPRESSOR) 50 MG tablet Take 1 tablet (50 mg total)  by mouth 2 (two) times daily.  60 tablet  6  . pravastatin (PRAVACHOL) 40 MG tablet Take 1 tablet (40 mg total) by mouth every evening.  30 tablet  11  . guaiFENesin (MUCINEX) 600 MG 12 hr tablet Take 1 tablet (600 mg total) by mouth 2 (two) times daily as needed for congestion.  60 tablet  2   Family History  Problem Relation Age of Onset  . Cancer Sister   . Sickle cell anemia Son    History   Social History  . Marital Status: Married    Spouse Name: N/A    Number of Children: N/A  . Years of Education: N/A   Occupational History  . unemployed    Social History Main Topics  . Smoking status: Current Every Day Smoker -- 0.2 packs/day for 10 years    Types: Cigarettes  . Smokeless tobacco: None  . Alcohol Use: No  . Drug Use: No  . Sexually Active: None   Other Topics Concern  . None   Social History Narrative   Lives at home with wife and step daughter.   Review of Systems: Constitutional: Denies fever, chills, diaphoresis, appetite change and fatigue.  HEENT: + occasional congestion and ear fullness. Denies photophobia, eye pain, hearing loss, ear pain, mouth sores, trouble swallowing,  neck pain/stiffness.   Respiratory: + dyspnea with singing. Otherwise denies SOB, DOE, cough, chest tightness, or wheezing.   Cardiovascular: Denies chest pain, palpitations and leg swelling.  Gastrointestinal: Denies nausea, vomiting, abdominal pain, diarrhea, constipation.  Genitourinary: Denies dysuria, urgency, frequency, hematuria, or difficulty urinating.  Musculoskeletal: Denies myalgias, back pain, or arthralgias. Neurological: Denies dizziness, seizures, syncope, weakness, light-headedness, numbness or headaches.  Psychiatric/Behavioral: Denies mood changes  Objective:  Physical Exam: Filed Vitals:   01/21/12 1539  BP: 167/113  Pulse: 88  Temp: 97.5 F (36.4 C)  TempSrc: Oral  Height: 6' 1.25" (1.861 m)  Weight: 164 lb 9.6 oz (74.662 kg)  SpO2: 99%   Constitutional:  Vital signs reviewed.  Patient is a well-developed & well-nourished male in NAD & cooperative with exam.   Head: Normocephalic and atraumatic Eyes: PERRL, EOMI. No scleral icterus.  Cardiovascular: RRR, no MRG, pulses symmetric and intact bilaterally Pulmonary/Chest: CTAB, no wheezes, rales, or rhonchi Abdominal: Soft. Non-tender, non-distended, no masses, organomegaly, or guarding present.  Musculoskeletal: No joint deformities, erythema, or stiffness, no nontender Neurological: A&O x3, Strength is normal and symmetric bilaterally, cranial nerve II-XII are grossly intact, no focal motor deficit Skin: Warm, dry and intact.  Psychiatric: Normal mood and affect.  Assessment & Plan:  Please refer to Problem List based A&P

## 2012-01-21 NOTE — Patient Instructions (Addendum)
Continue to work toward quitting smoking.  Continue your Lisinopril and Metoprolol.  Please use your albuterol inhaler 2 puffs every 6 hours as needed for shortness of breath or prior to singing.  Follow up in 2-3 weeks in the clinic so we can check your blood pressure.

## 2012-01-25 NOTE — Assessment & Plan Note (Signed)
PFTs from 12/04/11 show mild air flow limitation, improved with bronchodilator. Pt requesting rescue inhaler, so prescribing Proair 2p q6h PRN SOB, dyspnea, or wheezing.

## 2012-01-25 NOTE — Assessment & Plan Note (Addendum)
Pt likely off medications for about 2 weeks.  BP elevated to 167/113. Pt is to continue current regimen of Lisinopril 20mg  daily and Lopressor 50mg  BID. He is to f/u in 2-3 weeks in clinic for a blood pressure check.

## 2012-01-25 NOTE — Assessment & Plan Note (Signed)
Needs interrogation. Missed Cards appointment- pt to reschedule. Emphasized importance of interrogation, as this was to be done in July.

## 2012-01-25 NOTE — Assessment & Plan Note (Signed)
Smoking 1pp week

## 2012-02-25 ENCOUNTER — Encounter: Payer: Self-pay | Admitting: Internal Medicine

## 2012-02-25 ENCOUNTER — Ambulatory Visit (INDEPENDENT_AMBULATORY_CARE_PROVIDER_SITE_OTHER): Payer: Medicare Other | Admitting: Internal Medicine

## 2012-02-25 VITALS — BP 185/122 | HR 87 | Temp 97.5°F | Ht 73.25 in | Wt 169.3 lb

## 2012-02-25 DIAGNOSIS — F17200 Nicotine dependence, unspecified, uncomplicated: Secondary | ICD-10-CM | POA: Diagnosis not present

## 2012-02-25 DIAGNOSIS — I10 Essential (primary) hypertension: Secondary | ICD-10-CM | POA: Diagnosis not present

## 2012-02-25 DIAGNOSIS — Z950 Presence of cardiac pacemaker: Secondary | ICD-10-CM

## 2012-02-25 MED ORDER — CHLORTHALIDONE 25 MG PO TABS: 25.0000 mg | ORAL_TABLET | Freq: Every day | ORAL | Status: DC

## 2012-02-25 MED ORDER — METOPROLOL TARTRATE 50 MG PO TABS: 75.0000 mg | ORAL_TABLET | Freq: Two times a day (BID) | ORAL | Status: DC

## 2012-02-25 NOTE — Assessment & Plan Note (Signed)
  Assessment:  Progress toward smoking cessation:   smoking 2-3 cigs/day  Barriers to progress toward smoking cessation:   addiction, but pt wants to quit on his own without outside assistance  Plan:  Instruction/counseling given:  I counseled patient on the dangers of tobacco use, advised patient to stop smoking and reviewed strategies to maximize success.  Educational resources provided:  [employer]) brochure   Patient agreed to the following self-care plans for smoking cessation:   He is trying to quit for New Years

## 2012-02-25 NOTE — Assessment & Plan Note (Addendum)
BP Readings from Last 3 Encounters:  02/25/12 185/122  01/21/12 167/113  11/25/11 170/111    Lab Results  Component Value Date   NA 140 11/25/2011   K 4.3 11/25/2011   CREATININE 0.97 11/25/2011    Assessment:  Blood pressure control:  poor   Progress toward BP goal:   poor    Plan:  Medications:  see below  Educational resources provided: brochure   Despite compliance with his medication, his BP continues to be significantly elevated. He endorses compliance with his meds which I believe. In speaking to his pharmacy, he has recently refilled the Lisinopril but not the Lopressor, which he has extra of at home (due to previous noncompliance). Will increase Lopressor to 75mg  BID and start Chlorthalidone 25mg  daily in addition to the continuing the Lisinopril 20mg  daily. Checking potassium in 3 weeks due to addition of Chlorthalidone. His headaches are likely due to his HTN- I instructed him to go the ED when his headaches worsen, as his blood pressures are likely significantly elevated and he will need immediate treatment.

## 2012-02-25 NOTE — Patient Instructions (Addendum)
**Continue the Lisinopril 20mg  daily. We will increase the Lopressor to 75mg  twice a day (this will be 1 and 1/2 pills of your current medication). A new prescription for the Lopressor 75mg  twice a day has been sent to your pharmacy. **You are to start a new medication, Chlorthalidone 25mg  daily. You will need to return to the clinic in 3 weeks to have your potassium level checked, as this new medication can reduce your potassium levels.   Hypertension As your heart beats, it forces blood through your arteries. This force is your blood pressure. If the pressure is too high, it is called hypertension (HTN) or high blood pressure. HTN is dangerous because you may have it and not know it. High blood pressure may mean that your heart has to work harder to pump blood. Your arteries may be narrow or stiff. The extra work puts you at risk for heart disease, stroke, and other problems.   Blood pressure consists of two numbers, a higher number over a lower, 110/72, for example. It is stated as "110 over 72." The ideal is below 120 for the top number (systolic) and under 80 for the bottom (diastolic). Write down your blood pressure today. You should pay close attention to your blood pressure if you have certain conditions such as:  Heart failure.   Prior heart attack.   Diabetes   Chronic kidney disease.   Prior stroke.   Multiple risk factors for heart disease.  To see if you have HTN, your blood pressure should be measured while you are seated with your arm held at the level of the heart. It should be measured at least twice. A one-time elevated blood pressure reading (especially in the Emergency Department) does not mean that you need treatment. There may be conditions in which the blood pressure is different between your right and left arms. It is important to see your caregiver soon for a recheck. Most people have essential hypertension which means that there is not a specific cause. This type of  high blood pressure may be lowered by changing lifestyle factors such as:  Stress.   Smoking.   Lack of exercise.   Excessive weight.   Drug/tobacco/alcohol use.   Eating less salt.  Most people do not have symptoms from high blood pressure until it has caused damage to the body. Effective treatment can often prevent, delay or reduce that damage. TREATMENT   When a cause has been identified, treatment for high blood pressure is directed at the cause. There are a large number of medications to treat HTN. These fall into several categories, and your caregiver will help you select the medicines that are best for you. Medications may have side effects. You should review side effects with your caregiver. If your blood pressure stays high after you have made lifestyle changes or started on medicines,    Your medication(s) may need to be changed.   Other problems may need to be addressed.   Be certain you understand your prescriptions, and know how and when to take your medicine.   Be sure to follow up with your caregiver within the time frame advised (usually within two weeks) to have your blood pressure rechecked and to review your medications.   If you are taking more than one medicine to lower your blood pressure, make sure you know how and at what times they should be taken. Taking two medicines at the same time can result in blood pressure that is too low.  SEEK IMMEDIATE MEDICAL CARE IF:  You develop a severe headache, blurred or changing vision, or confusion.   You have unusual weakness or numbness, or a faint feeling.   You have severe chest or abdominal pain, vomiting, or breathing problems.  MAKE SURE YOU:    Understand these instructions.   Will watch your condition.   Will get help right away if you are not doing well or get worse.  Document Released: 02/23/2005 Document Revised: 05/18/2011 Document Reviewed: 10/14/2007 [facility] Patient Information 2013 [place],  [place].

## 2012-02-25 NOTE — Progress Notes (Signed)
Patient ID: [name], male   DOB: 05/02/1950, 61 y.o.   MRN: [id number]  Subjective:   Patient ID: [name] male   DOB: 18-Jan-1951 61 y.o.   MRN: [id number]  HPI: [name] is a 61 y.o. M with uncontrolled HTN and DM presents to the clinic for a blood pressure check. Pt endorses compliance with his medications. He states that he had extra Lopressor at home so has not had the prescription from November filled. In speaking with the pharmacy, he did fill the Lisinopril on 01/21/12. He does endorse a throbbing headache over the past week in his left temple that was so severe at times, he says he almost came to the ED.  Past Medical History  Diagnosis Date  . Tuberculosis at age 27-3  . Sickle cell trait   . Stroke 10/03/08    Right thalamus  . Hypertension     goal < 140/90  . Emphysema     per multiple CXRs  . Snoring     Sleep study 09/02/10 was WNL  . CEREBROVASCULAR ACCIDENT 10/17/2008    Acute CVA of right thalamus 10/03/08 ECHO performed 2/2 CVA 10/09/08, EF 55-65%  Aspirin 325 mg daily and try to work on quitting smoking.    Current Outpatient Prescriptions  Medication Sig Dispense Refill  . aspirin 325 MG tablet Take 1 tablet (325 mg total) by mouth daily.  100 tablet  11  . guaiFENesin (MUCINEX) 600 MG 12 hr tablet Take 1 tablet (600 mg total) by mouth 2 (two) times daily as needed for congestion.  60 tablet  2  . lisinopril (PRINIVIL,ZESTRIL) 20 MG tablet Take 1 tablet (20 mg total) by mouth daily.  30 tablet  11  . metoprolol (LOPRESSOR) 50 MG tablet Take 1 tablet (50 mg total) by mouth 2 (two) times daily.  60 tablet  11  . pravastatin (PRAVACHOL) 40 MG tablet Take 1 tablet (40 mg total) by mouth every evening.  30 tablet  11  . albuterol (PROVENTIL HFA;VENTOLIN HFA) 108 (90 BASE) MCG/ACT inhaler Inhale 2 puffs into the lungs every 6 (six) hours as needed for wheezing.  1 Inhaler  2   Family History  Problem Relation Age of Onset  . Cancer Sister   .  Sickle cell anemia Son    History   Social History  . Marital Status: Married    Spouse Name: N/A    Number of Children: N/A  . Years of Education: N/A   Occupational History  . unemployed    Social History Main Topics  . Smoking status: Current Every Day Smoker -- 0.2 packs/day for 10 years    Types: Cigarettes  . Smokeless tobacco: None  . Alcohol Use: No  . Drug Use: No  . Sexually Active: None   Other Topics Concern  . None   Social History Narrative   Lives at home with wife and step daughter.   Review of Systems: ROS as per HPI   Objective:  Physical Exam: Filed Vitals:   02/25/12 1520 02/25/12 1522  BP: 185/122 185/122  Pulse: 87   Temp: 97.5 F (36.4 C)   TempSrc: Oral   Height: 6' 1.25" (1.861 m)   Weight: 169 lb 4.8 oz (76.794 kg)   SpO2: 98%    Constitutional: Vital signs reviewed.  Patient is a well-developed and well-nourished male in no acute distress and cooperative with exam. Alert and oriented x3.  Head: Normocephalic and atraumatic Eyes: PERRL,  EOMI Cardiovascular: RRR, no MRG Pulmonary/Chest: CTAB, no wheezes, rales, or rhonchi Abdominal: Soft. Non-tender, non-distended MSK: No edema Neurological: A&O x3   Assessment & Plan:   Please refer to Problem List based Assessment and Plan

## 2012-02-25 NOTE — Assessment & Plan Note (Signed)
Pacemaker to be interrogated at [facility] 02/29/12

## 2012-02-26 ENCOUNTER — Encounter: Payer: Medicare Other | Admitting: Internal Medicine

## 2012-02-26 NOTE — Progress Notes (Signed)
INTERNAL MEDICINE TEACHING ATTENDING ADDENDUM - Jonah Blue, DO: I reviewed with the resident Dr. Sherrine Maples, medical history, physical examination, diagnosis and results of tests and treatment and I agree with the patient's care as documented.

## 2012-03-13 ENCOUNTER — Emergency Department (HOSPITAL_COMMUNITY)
Admission: EM | Admit: 2012-03-13 | Discharge: 2012-03-13 | Disposition: A | Discharge status: Home / Self Care | Payer: Medicare Other | Attending: Emergency Medicine | Admitting: Emergency Medicine

## 2012-03-13 ENCOUNTER — Encounter (HOSPITAL_COMMUNITY): Payer: Self-pay | Admitting: Emergency Medicine

## 2012-03-13 DIAGNOSIS — Z8673 Personal history of transient ischemic attack (TIA), and cerebral infarction without residual deficits: Secondary | ICD-10-CM | POA: Diagnosis not present

## 2012-03-13 DIAGNOSIS — Z9852 Vasectomy status: Secondary | ICD-10-CM | POA: Diagnosis not present

## 2012-03-13 DIAGNOSIS — R109 Unspecified abdominal pain: Secondary | ICD-10-CM | POA: Insufficient documentation

## 2012-03-13 DIAGNOSIS — Z862 Personal history of diseases of the blood and blood-forming organs and certain disorders involving the immune mechanism: Secondary | ICD-10-CM | POA: Insufficient documentation

## 2012-03-13 DIAGNOSIS — Z8709 Personal history of other diseases of the respiratory system: Secondary | ICD-10-CM | POA: Diagnosis not present

## 2012-03-13 DIAGNOSIS — Z9089 Acquired absence of other organs: Secondary | ICD-10-CM | POA: Diagnosis not present

## 2012-03-13 DIAGNOSIS — K044 Acute apical periodontitis of pulpal origin: Secondary | ICD-10-CM | POA: Insufficient documentation

## 2012-03-13 DIAGNOSIS — I10 Essential (primary) hypertension: Secondary | ICD-10-CM | POA: Insufficient documentation

## 2012-03-13 DIAGNOSIS — R51 Headache: Secondary | ICD-10-CM | POA: Diagnosis not present

## 2012-03-13 DIAGNOSIS — K089 Disorder of teeth and supporting structures, unspecified: Secondary | ICD-10-CM | POA: Insufficient documentation

## 2012-03-13 DIAGNOSIS — F17200 Nicotine dependence, unspecified, uncomplicated: Secondary | ICD-10-CM | POA: Insufficient documentation

## 2012-03-13 DIAGNOSIS — Z79899 Other long term (current) drug therapy: Secondary | ICD-10-CM | POA: Insufficient documentation

## 2012-03-13 DIAGNOSIS — Z8611 Personal history of tuberculosis: Secondary | ICD-10-CM | POA: Diagnosis not present

## 2012-03-13 DIAGNOSIS — Z7982 Long term (current) use of aspirin: Secondary | ICD-10-CM | POA: Diagnosis not present

## 2012-03-13 DIAGNOSIS — K047 Periapical abscess without sinus: Secondary | ICD-10-CM

## 2012-03-13 MED ORDER — CLINDAMYCIN HCL 150 MG PO CAPS: 150.0000 mg | ORAL_CAPSULE | Freq: Four times a day (QID) | ORAL | Status: DC

## 2012-03-13 NOTE — ED Notes (Signed)
Pt reports hx of stroke. No deficits from stroke. Pt reports right sided headache x2 days. Reports he feels better today. Thinks the headache is from right upper front tooth that is "open to air". Pt has not been to dentist yet because insurance has not kicked in. Pt reports he thinks the "tooth infection is affecting his stomach." denies vomiting. But "has not been able to eat".

## 2012-03-13 NOTE — ED Notes (Signed)
Pt escorted to discharge window. Pt verbalized understanding discharge instructions. In no acute distress.  

## 2012-03-13 NOTE — ED Notes (Addendum)
Pt c/o right sided headache for 2 days, pt states he thinks he may have a dental infection. Pt also c/o body aches and states he blew his nose earlier today and a clot of blood came out. When asked to rate his pain, pt states "I don't have no pain, just a headache."

## 2012-03-13 NOTE — ED Notes (Signed)
Pt alert and oriented x4. Respirations even and unlabored, bilateral symmetrical rise and fall of chest. Skin warm and dry. In no acute distress. Denies needs.   

## 2012-03-13 NOTE — ED Provider Notes (Signed)
History     CSN: [id number]  Arrival date & time 03/13/12  1631   First MD Initiated Contact with Patient 03/13/12 1724      Chief Complaint  Patient presents with  . Headache  . Abdominal Pain    (Consider location/radiation/quality/duration/timing/severity/associated sxs/prior treatment) Patient is a 62 y.o. male presenting with headaches and abdominal pain. The history is provided by the patient.  Headache   Abdominal Pain The primary symptoms of the illness include abdominal pain.   patient complains of left front tooth pain x1 month worse x2 days. Some headache from when air hits his tooth. No fever or chills. No vomiting. No trouble swallowing. Does have a history of dental infections in the past. Denies any peripheral weakness. Has used over-the-counter medications without relief  Past Medical History  Diagnosis Date  . Tuberculosis at age 78-3  . Sickle cell trait   . Stroke 10/03/08    Right thalamus  . Hypertension     goal < 140/90  . Emphysema     per multiple CXRs  . Snoring     Sleep study 09/02/10 was WNL  . CEREBROVASCULAR ACCIDENT 10/17/2008    Acute CVA of right thalamus 10/03/08 ECHO performed 2/2 CVA 10/09/08, EF 55-65%  Aspirin 325 mg daily and try to work on quitting smoking.     Past Surgical History  Procedure Date  . Appendectomy 1977  . Vasectomy 1990  . Pacemaker insertion 2011    2/2 symptomatic bradycardia per pt    Family History  Problem Relation Age of Onset  . Cancer Sister   . Sickle cell anemia Son     History  Substance Use Topics  . Smoking status: Current Every Day Smoker -- 0.2 packs/day for 10 years    Types: Cigarettes  . Smokeless tobacco: Not on file  . Alcohol Use: No      Review of Systems  Gastrointestinal: Positive for abdominal pain.  Neurological: Positive for headaches.  All other systems reviewed and are negative.    Allergies  Review of patient's allergies indicates no known allergies.  Home  Medications   Current Outpatient Rx  Name  Route  Sig  Dispense  Refill  . ASPIRIN 325 MG PO TABS   Oral   Take 1 tablet (325 mg total) by mouth daily.   100 tablet   11   . CHLORTHALIDONE 25 MG PO TABS   Oral   Take 1 tablet (25 mg total) by mouth daily.   30 tablet   11   . GUAIFENESIN ER 600 MG PO TB12   Oral   Take 1 tablet (600 mg total) by mouth 2 (two) times daily as needed for congestion.   60 tablet   2   . LISINOPRIL 20 MG PO TABS   Oral   Take 1 tablet (20 mg total) by mouth daily.   30 tablet   11   . METOPROLOL TARTRATE 50 MG PO TABS   Oral   Take 1.5 tablets (75 mg total) by mouth 2 (two) times daily.   60 tablet   11   . PRAVASTATIN SODIUM 40 MG PO TABS   Oral   Take 1 tablet (40 mg total) by mouth every evening.   30 tablet   11     BP 126/87  Pulse 99  Temp 98.6 F (37 C) (Oral)  Resp 16  SpO2 99%  Physical Exam  Nursing note and vitals reviewed. Constitutional: He  is oriented to person, place, and time. He appears well-developed and well-nourished.  Non-toxic appearance. No distress.  HENT:  Head: Normocephalic and atraumatic.       Dental fracture appreciated without abscess or obvious drainage  Eyes: Conjunctivae normal, EOM and lids are normal. Pupils are equal, round, and reactive to light.  Neck: Normal range of motion. Neck supple. No tracheal deviation present. No mass present.  Cardiovascular: Normal rate, regular rhythm and normal heart sounds.  Exam reveals no gallop.   No murmur heard. Pulmonary/Chest: Effort normal and breath sounds normal. No stridor. No respiratory distress. He has no decreased breath sounds. He has no wheezes. He has no rhonchi. He has no rales.  Abdominal: Soft. Normal appearance and bowel sounds are normal. He exhibits no distension. There is no tenderness. There is no rebound and no CVA tenderness.  Musculoskeletal: Normal range of motion. He exhibits no edema and no tenderness.  Neurological: He is  alert and oriented to person, place, and time. He has normal strength. No cranial nerve deficit or sensory deficit. GCS eye subscore is 4. GCS verbal subscore is 5. GCS motor subscore is 6.  Skin: Skin is warm and dry. No abrasion and no rash noted.  Psychiatric: He has a normal mood and affect. His speech is normal and behavior is normal.    ED Course  Procedures (including critical care time)  Labs Reviewed - No data to display No results found.   No diagnosis found.    MDM  Patient to be placed on clindamycin and will see his dentist tomorrow        Toy Baker, MD 03/13/12 1732

## 2012-03-17 ENCOUNTER — Encounter: Payer: Self-pay | Admitting: Internal Medicine

## 2012-03-17 ENCOUNTER — Ambulatory Visit (INDEPENDENT_AMBULATORY_CARE_PROVIDER_SITE_OTHER): Payer: Medicare Other | Admitting: Internal Medicine

## 2012-03-17 VITALS — BP 100/74 | HR 85 | Temp 96.5°F | Ht 73.25 in | Wt 160.6 lb

## 2012-03-17 DIAGNOSIS — I10 Essential (primary) hypertension: Secondary | ICD-10-CM | POA: Diagnosis not present

## 2012-03-17 DIAGNOSIS — S025XXA Fracture of tooth (traumatic), initial encounter for closed fracture: Secondary | ICD-10-CM | POA: Diagnosis not present

## 2012-03-17 DIAGNOSIS — F17200 Nicotine dependence, unspecified, uncomplicated: Secondary | ICD-10-CM

## 2012-03-17 DIAGNOSIS — N529 Male erectile dysfunction, unspecified: Secondary | ICD-10-CM

## 2012-03-17 LAB — BASIC METABOLIC PANEL
Calcium: 10 mg/dL (ref 8.4–10.5)
Glucose, Bld: 102 mg/dL — ABNORMAL HIGH (ref 70–99)
Potassium: 4.1 mEq/L (ref 3.5–5.3)
Sodium: 135 mEq/L (ref 135–145)

## 2012-03-17 MED ORDER — TADALAFIL 10 MG PO TABS: 10.0000 mg | ORAL_TABLET | ORAL | Status: DC | PRN

## 2012-03-17 NOTE — Assessment & Plan Note (Signed)
Patient notes cutting back on smoking to about 1/2 ppd!  We discussed smoking cessation today.  Patient does not want nicotine replacement products or other medications at this time.  Info for quitline given to patient.

## 2012-03-17 NOTE — Assessment & Plan Note (Addendum)
BP has significantly improved since starting Chlorthalidone.  Patient notes some fatigue. -check BMET for K, which may be contributing to non-specific fatigue  Addendum: BMET shows normal K, but Cr has increased somewhat.  Given somewhat low BP and higher CR, we'll decrease Chlorthalidone dose from 25 to 15 mg/day.  New prescription sent to pharmacy.

## 2012-03-17 NOTE — Assessment & Plan Note (Signed)
Patient still completing clindamycin course.  Patient has appointment with Romie Levee (dentist) in 1 week

## 2012-03-17 NOTE — Assessment & Plan Note (Signed)
Patient notes ED.  No contraindications to cialis. -prescribed cialis

## 2012-03-17 NOTE — Progress Notes (Signed)
HPI The patient is a 62 y.o. male with a history of HTN, HL, stroke, asthma, presenting for an acute visit.  The patient called 3 days ago with abdominal pain.  He started taking Clindamycin 3 days ago for a dental infection.  He described the stomach pain as an epigastric "burning" sensation.  The pain has now fully resolved.  The patient has not yet seen his dentist Romie Levee), but has an appointment in 1 week.  The patient's BP has significantly improved today.  The patient now notes generalized fatigue since starting Chlorthalidone.    He also notes lack of sexual drive.  He states that he used to take cialis, and is interested in restarting this medication  The patient is trying to quit smoking, and has decreased his usage to 1/2 pack per day (from 1 ppd).  ROS: General: no fevers, chills, changes in weight, changes in appetite Skin: no rash HEENT: no blurry vision, hearing changes, sore throat Pulm: no dyspnea, coughing, wheezing CV: no chest pain, palpitations, shortness of breath Abd: no abdominal pain, nausea/vomiting, diarrhea/constipation GU: no dysuria, hematuria, polyuria Ext: no arthralgias, myalgias Neuro: no weakness, numbness, or tingling  Filed Vitals:   03/17/12 0824  BP: 100/74  Pulse: 85  Temp: 96.5 F (35.8 C)    PEX General: alert, cooperative, and in no apparent distress HEENT: pupils equal round and reactive to light, vision grossly intact, oropharynx clear and non-erythematous. R front upper tooth chipped. Neck: supple, no lymphadenopathy Lungs: clear to ascultation bilaterally, normal work of respiration, no wheezes, rales, ronchi Heart: regular rate and rhythm, no murmurs, gallops, or rubs Abdomen: soft, non-tender, non-distended, normal bowel sounds Extremities:no cyanosis, clubbing, or edema Neurologic: alert & oriented X3, cranial nerves II-XII intact, strength grossly intact, sensation intact to light touch  Current Outpatient Prescriptions  on File Prior to Visit  Medication Sig Dispense Refill  . aspirin 325 MG tablet Take 1 tablet (325 mg total) by mouth daily.  100 tablet  11  . chlorthalidone (HYGROTON) 25 MG tablet Take 1 tablet (25 mg total) by mouth daily.  30 tablet  11  . clindamycin (CLEOCIN) 150 MG capsule Take 1 capsule (150 mg total) by mouth every 6 (six) hours.  28 capsule  0  . guaiFENesin (MUCINEX) 600 MG 12 hr tablet Take 1 tablet (600 mg total) by mouth 2 (two) times daily as needed for congestion.  60 tablet  2  . lisinopril (PRINIVIL,ZESTRIL) 20 MG tablet Take 1 tablet (20 mg total) by mouth daily.  30 tablet  11  . metoprolol (LOPRESSOR) 50 MG tablet Take 1.5 tablets (75 mg total) by mouth 2 (two) times daily.  60 tablet  11  . pravastatin (PRAVACHOL) 40 MG tablet Take 1 tablet (40 mg total) by mouth every evening.  30 tablet  11    Assessment/Plan  Abdominal pain - resolved, difficult to determine etiology.  Will reassess if pain recurs.

## 2012-03-17 NOTE — Patient Instructions (Addendum)
General Instructions: Your blood pressure is well-controlled today, good job!!  We are checking your potassium level, and may call you to adjust your blood pressure medication based on the results of that test.  Congratulations on cutting back on smoking!!  Quitting smoking is the best thing you can do for your health.  For your erectile dysfunction, we are prescribing Cialis.  Take 1 tablet at least 30 minutes prior to sexual activity.   Treatment Goals:  Goals (1 Years of Data) as of 03/17/2012          As of Today 03/13/12 03/13/12 02/25/12 02/25/12     Blood Pressure    . Blood Pressure < 140/90  100/74 124/82 126/87 185/122 185/122      Progress Toward Treatment Goals:  Treatment Goal 03/17/2012  Blood pressure at goal  Stop smoking smoking less  Other  at goal    Self Care Goals & Plans:  Self Care Goal 03/17/2012  Manage my medications bring my medications to every visit  Monitor my health -  Eat healthy foods -  Be physically active -       Care Management & Community Referrals:  Referral 03/17/2012  Referrals made for care management support none needed

## 2012-03-18 MED ORDER — CHLORTHALIDONE 15 MG PO TABS: 15.0000 mg | ORAL_TABLET | Freq: Every day | ORAL | Status: DC

## 2012-03-18 NOTE — Addendum Note (Signed)
Addended by: Linward Headland on: 03/18/2012 01:21 PM   Modules accepted: Orders, Medications

## 2012-03-21 ENCOUNTER — Other Ambulatory Visit: Payer: Self-pay | Admitting: *Deleted

## 2012-03-21 DIAGNOSIS — I10 Essential (primary) hypertension: Secondary | ICD-10-CM

## 2012-03-21 MED ORDER — CHLORTHALIDONE 25 MG PO TABS: 12.5000 mg | ORAL_TABLET | Freq: Every day | ORAL | Status: DC

## 2012-03-21 NOTE — Telephone Encounter (Signed)
Dr Manson Passey, hygroten only come in 25 or 50 mg tabs.  You decreased at last visit to 15 mg.  Please advise

## 2012-04-11 ENCOUNTER — Encounter: Payer: Self-pay | Admitting: Internal Medicine

## 2012-04-11 ENCOUNTER — Ambulatory Visit (INDEPENDENT_AMBULATORY_CARE_PROVIDER_SITE_OTHER): Payer: Medicare Other | Admitting: Internal Medicine

## 2012-04-11 VITALS — BP 108/81 | HR 91 | Ht 73.25 in | Wt 162.8 lb

## 2012-04-11 DIAGNOSIS — F17200 Nicotine dependence, unspecified, uncomplicated: Secondary | ICD-10-CM

## 2012-04-11 DIAGNOSIS — E785 Hyperlipidemia, unspecified: Secondary | ICD-10-CM

## 2012-04-11 DIAGNOSIS — I4719 Other supraventricular tachycardia: Secondary | ICD-10-CM

## 2012-04-11 DIAGNOSIS — I10 Essential (primary) hypertension: Secondary | ICD-10-CM

## 2012-04-11 DIAGNOSIS — I495 Sick sinus syndrome: Secondary | ICD-10-CM | POA: Diagnosis not present

## 2012-04-11 DIAGNOSIS — I498 Other specified cardiac arrhythmias: Secondary | ICD-10-CM

## 2012-04-11 DIAGNOSIS — I471 Supraventricular tachycardia: Secondary | ICD-10-CM

## 2012-04-11 HISTORY — DX: Other supraventricular tachycardia: I47.19

## 2012-04-11 HISTORY — DX: Supraventricular tachycardia: I47.1

## 2012-04-11 LAB — PACEMAKER DEVICE OBSERVATION
AL IMPEDENCE PM: 523 Ohm
AL THRESHOLD: 0.5 V
RV LEAD AMPLITUDE: 15.68 mv
RV LEAD THRESHOLD: 0.75 V

## 2012-04-11 NOTE — Assessment & Plan Note (Signed)
Stable No change required today  

## 2012-04-11 NOTE — Assessment & Plan Note (Signed)
Cessation advised He is not ready to quit

## 2012-04-11 NOTE — Assessment & Plan Note (Signed)
Pacemaker interrogation reveals atrial tachycardia.  Longest episode 4 minutes.  No episodes since 4/13. Will follow

## 2012-04-11 NOTE — Progress Notes (Signed)
Genelle Gather, MD: Primary Cardiologist:  Previously [facility]  [name] is a 62 y.o. male with a h/o bradycardia sp PPM (MDT) by Dr Fredrich Birks in 2010 who presents today to establish care in the Electrophysiology device clinic.  He reports being released from their practice due to inability to pay his bills.   He is doing well at this time.  Today, he  denies symptoms of palpitations, chest pain, shortness of breath, orthopnea, PND, lower extremity edema, dizziness, presyncope, syncope, or neurologic sequela.  The patientis tolerating medications without difficulties and is otherwise without complaint today.   Past Medical History  Diagnosis Date  . Tuberculosis at age 66-3  . Sickle cell trait   . Stroke 10/03/08    Right thalamus  . Hypertension     goal < 140/90  . Emphysema     per multiple CXRs  . Snoring     Sleep study 09/02/10 was WNL  . CEREBROVASCULAR ACCIDENT 10/17/2008    Acute CVA of right thalamus 10/03/08 ECHO performed 2/2 CVA 10/09/08, EF 55-65%  Aspirin 325 mg daily and try to work on quitting smoking.    Past Surgical History  Procedure Date  . Appendectomy 1977  . Vasectomy 1990  . Pacemaker insertion 07/24/08    MDT Adapta L implanted by Dr Fredrich Birks    History   Social History  . Marital Status: Married    Spouse Name: N/A    Number of Children: N/A  . Years of Education: N/A   Occupational History  . unemployed    Social History Main Topics  . Smoking status: Current Every Day Smoker -- 0.5 packs/day for 10 years    Types: Cigarettes  . Smokeless tobacco: Not on file     Comment: Cutting back.  . Alcohol Use: No  . Drug Use: No     Comment: previous polysubstance abuser, quit x 13 years  . Sexually Active: Not on file   Other Topics Concern  . Not on file   Social History Narrative   Lives in [place].  Works as a Pharmacist, hospital for [employer]    Family History  Problem Relation Age of Onset  . Cancer Sister   .  Sickle cell anemia Son     No Known Allergies  Current Outpatient Prescriptions  Medication Sig Dispense Refill  . aspirin 325 MG tablet Take 1 tablet (325 mg total) by mouth daily.  100 tablet  11  . chlorthalidone (HYGROTON) 25 MG tablet Take 0.5 tablets (12.5 mg total) by mouth daily.  15 tablet  11  . clindamycin (CLEOCIN) 150 MG capsule Take 1 capsule (150 mg total) by mouth every 6 (six) hours.  28 capsule  0  . guaiFENesin (MUCINEX) 600 MG 12 hr tablet Take 1 tablet (600 mg total) by mouth 2 (two) times daily as needed for congestion.  60 tablet  2  . lisinopril (PRINIVIL,ZESTRIL) 20 MG tablet Take 1 tablet (20 mg total) by mouth daily.  30 tablet  11  . metoprolol (LOPRESSOR) 50 MG tablet Take 1.5 tablets (75 mg total) by mouth 2 (two) times daily.  60 tablet  11  . pravastatin (PRAVACHOL) 40 MG tablet Take 1 tablet (40 mg total) by mouth every evening.  30 tablet  11  . tadalafil (CIALIS) 10 MG tablet Take 1 tablet (10 mg total) by mouth as needed for erectile dysfunction (take at least 30 minutes prior to sexual activity).  20 tablet  1    ROS- all systems are reviewed and negative except as per HPI  Physical Exam: Filed Vitals:   04/11/12 0920  BP: 108/81  Pulse: 91  Height: 6' 1.25" (1.861 m)  Weight: 162 lb 12.8 oz (73.846 kg)    GEN- The patient is well appearing, alert and oriented x 3 today.   Head- normocephalic, atraumatic Eyes-  Sclera clear, conjunctiva pink Ears- hearing intact Oropharynx- clear Neck- supple, no JVP Lymph- no cervical lymphadenopathy Lungs- Clear to ausculation bilaterally, normal work of breathing Chest- pacemaker pocket is well healed Heart- Regular rate and rhythm, no murmurs, rubs or gallops, PMI not laterally displaced GI- soft, NT, ND, + BS Extremities- no clubbing, cyanosis, or edema MS- no significant deformity or atrophy Skin- no rash or lesion Psych- euthymic mood, full affect Neuro- strength and sensation are  intact  Pacemaker interrogation- reviewed in detail today,  See PACEART report  ekg today reveals atrial pacing at 85 bpm, LVH  Assessment and Plan:

## 2012-04-11 NOTE — Assessment & Plan Note (Signed)
Normal pacemaker function See Pace Art report No changes today  

## 2012-04-14 NOTE — Addendum Note (Signed)
Addended by: Bufford Spikes on: 04/14/2012 10:33 AM   Modules accepted: Orders

## 2012-04-20 ENCOUNTER — Encounter: Payer: Self-pay | Admitting: Internal Medicine

## 2012-05-05 ENCOUNTER — Encounter: Payer: Medicare Other | Admitting: Internal Medicine

## 2012-06-08 ENCOUNTER — Encounter: Payer: Medicare Other | Admitting: Internal Medicine

## 2012-08-10 ENCOUNTER — Ambulatory Visit (INDEPENDENT_AMBULATORY_CARE_PROVIDER_SITE_OTHER): Payer: Medicare Other | Admitting: Internal Medicine

## 2012-08-10 ENCOUNTER — Encounter: Payer: Self-pay | Admitting: Internal Medicine

## 2012-08-10 VITALS — BP 152/96 | HR 83 | Temp 97.6°F | Ht 73.25 in | Wt 166.1 lb

## 2012-08-10 DIAGNOSIS — I10 Essential (primary) hypertension: Secondary | ICD-10-CM | POA: Diagnosis not present

## 2012-08-10 DIAGNOSIS — N529 Male erectile dysfunction, unspecified: Secondary | ICD-10-CM

## 2012-08-10 LAB — BASIC METABOLIC PANEL WITH GFR
CO2: 27 mEq/L (ref 19–32)
Chloride: 106 mEq/L (ref 96–112)
Glucose, Bld: 92 mg/dL (ref 70–99)
Sodium: 144 mEq/L (ref 135–145)

## 2012-08-10 MED ORDER — SILDENAFIL CITRATE 50 MG PO TABS: 50.0000 mg | ORAL_TABLET | ORAL | Status: DC | PRN

## 2012-08-10 NOTE — Progress Notes (Signed)
Subjective:   Patient ID: [name] male   DOB: 1950-03-13 62 y.o.   MRN: [id number]  HPI: [name] is a 62 y.o. man with PMH of HTN, emphysema, and CVA who presents to the clinic for the follow up visit.  He has no complaints. Asymptomatic. He is here to discuss with me about his antihypertensive therapy.  He has a lot of questions about why he needs to take the antihypertensive meds. He report that he took off all of his antihypertensive medications for last two months. He is noted to be hypertensive with BP 152/96 today.     Past Medical History  Diagnosis Date  . Tuberculosis at age 64-3  . Sickle cell trait   . Stroke 10/03/08    Right thalamus  . Hypertension     goal < 140/90  . Emphysema     per multiple CXRs  . Snoring     Sleep study 09/02/10 was WNL  . CEREBROVASCULAR ACCIDENT 10/17/2008    Acute CVA of right thalamus 10/03/08 ECHO performed 2/2 CVA 10/09/08, EF 55-65%  Aspirin 325 mg daily and try to work on quitting smoking.    Current Outpatient Prescriptions  Medication Sig Dispense Refill  . aspirin 325 MG tablet Take 1 tablet (325 mg total) by mouth daily.  100 tablet  11  . chlorthalidone (HYGROTON) 25 MG tablet Take 0.5 tablets (12.5 mg total) by mouth daily.  15 tablet  11  . clindamycin (CLEOCIN) 150 MG capsule Take 1 capsule (150 mg total) by mouth every 6 (six) hours.  28 capsule  0  . guaiFENesin (MUCINEX) 600 MG 12 hr tablet Take 1 tablet (600 mg total) by mouth 2 (two) times daily as needed for congestion.  60 tablet  2  . lisinopril (PRINIVIL,ZESTRIL) 20 MG tablet Take 1 tablet (20 mg total) by mouth daily.  30 tablet  11  . metoprolol (LOPRESSOR) 50 MG tablet Take 1.5 tablets (75 mg total) by mouth 2 (two) times daily.  60 tablet  11  . pravastatin (PRAVACHOL) 40 MG tablet Take 1 tablet (40 mg total) by mouth every evening.  30 tablet  11  . tadalafil (CIALIS) 10 MG tablet Take 1 tablet (10 mg total) by mouth as needed for erectile  dysfunction (take at least 30 minutes prior to sexual activity).  20 tablet  1   No current facility-administered medications for this visit.   Family History  Problem Relation Age of Onset  . Cancer Sister   . Sickle cell anemia Son    History   Social History  . Marital Status: Married    Spouse Name: N/A    Number of Children: N/A  . Years of Education: N/A   Occupational History  . unemployed    Social History Main Topics  . Smoking status: Current Every Day Smoker -- 0.10 packs/day for 10 years    Types: Cigarettes  . Smokeless tobacco: None     Comment: Cutting back.  . Alcohol Use: No  . Drug Use: No     Comment: previous polysubstance abuser, quit x 13 years  . Sexually Active: None   Other Topics Concern  . None   Social History Narrative   Lives in [place].  Works as a Pharmacist, hospital for [employer]   Review of Systems: Review of Systems:  Constitutional:  Denies fever, chills, diaphoresis, appetite change and fatigue.   HEENT:  Denies congestion, sore throat, rhinorrhea, sneezing, mouth  sores, trouble swallowing, neck pain   Respiratory:  Denies SOB, DOE, cough, and wheezing.   Cardiovascular:  Denies palpitations and leg swelling.   Gastrointestinal:  Denies nausea, vomiting, abdominal pain, diarrhea, constipation, blood in stool and abdominal distention.   Genitourinary:  Denies dysuria, urgency, frequency, hematuria, flank pain and difficulty urinating.   Musculoskeletal:  Denies myalgias, back pain, joint swelling, arthralgias and gait problem.   Skin:  Denies pallor, rash and wound.   Neurological:  Denies dizziness, seizures, syncope, weakness, light-headedness, numbness and headaches.    .    Objective:  Physical Exam: Filed Vitals:   08/10/12 1004  BP: 152/96  Pulse: 83  Temp: 97.6 F (36.4 C)  TempSrc: Oral  Height: 6' 1.25" (1.861 m)  Weight: 166 lb 1.6 oz (75.342 kg)  SpO2: 100%   General: alert, well-developed,  and cooperative to examination.  Head: normocephalic and atraumatic.  Eyes: vision grossly intact, pupils equal, pupils round, pupils reactive to light, no injection and anicteric.  Mouth: pharynx pink and moist, no erythema, and no exudates.  Neck: supple, full ROM, no thyromegaly, no JVD, and no carotid bruits.  Lungs: normal respiratory effort, no accessory muscle use, normal breath sounds, no crackles, and no wheezes. Heart: normal rate, regular rhythm, no murmur, no gallop, and no rub.  Abdomen: soft, non-tender, normal bowel sounds, no distention, no guarding, no rebound tenderness, no hepatomegaly, and no splenomegaly.  Msk: no joint swelling, no joint warmth, and no redness over joints.  Pulses: 2+ DP/PT pulses bilaterally Extremities: No cyanosis, clubbing, edema Neurologic: alert & oriented X3, cranial nerves II-XII intact, strength normal in all extremities, sensation intact to light touch, and gait normal.  Skin: turgor normal and no rashes.  Psych: Oriented X3, memory intact for recent and remote, normally interactive, good eye contact, not anxious appearing, and not depressed appearing.    Assessment & Plan:

## 2012-08-10 NOTE — Assessment & Plan Note (Addendum)
I discussed thoroughly with patient about the hypertension diagnosis, treatment and potential complications if poorly controlled. All questions answered. Patient voices understanding of the disease process and agrees to take his antihypertensive medications.  Face to face discussion is > 30 minutes  - he will f/u with his PCP in 1-2 months.

## 2012-08-10 NOTE — Patient Instructions (Addendum)
1. Please take your medications as prescribed. 2. It is very important to take all of your medications as instructed 3. Will check your BMP and testosterone level today 4. Cialis cencelled and Viagra Rx sent 5. Follow up with your PCP Dr. Sherrine Maples in 1-2 month.

## 2012-08-10 NOTE — Assessment & Plan Note (Addendum)
Patient states that Cialis is not working as good as Viagra, and would like to switch back to Viagra.  No contraindication to Viagra.  - will check his testosterone level - Viagra Rx sent.   Addendum -see Testosterone results--essentially normal with slightly higher sex hormone binding likely associated with aging.

## 2012-08-11 LAB — TESTOSTERONE, FREE, TOTAL, SHBG: Testosterone-% Free: 1.1 % — ABNORMAL LOW (ref 1.6–2.9)

## 2012-08-15 NOTE — Progress Notes (Signed)
TEACHING ATTENDING ADDENDUM: I discussed this case with Dr. Li at the time of the patient visit. I agree with the HPI, exam findings and have read the documentation provided by the resident,  and I concur with the plan of care. Please see the resident note for details of management.      

## 2012-09-01 ENCOUNTER — Encounter: Payer: Self-pay | Admitting: Internal Medicine

## 2012-09-01 ENCOUNTER — Ambulatory Visit (INDEPENDENT_AMBULATORY_CARE_PROVIDER_SITE_OTHER): Payer: Medicare Other | Admitting: Internal Medicine

## 2012-09-01 VITALS — BP 104/78 | HR 82 | Temp 97.2°F | Ht 73.25 in | Wt 161.8 lb

## 2012-09-01 DIAGNOSIS — E785 Hyperlipidemia, unspecified: Secondary | ICD-10-CM | POA: Diagnosis not present

## 2012-09-01 DIAGNOSIS — I10 Essential (primary) hypertension: Secondary | ICD-10-CM

## 2012-09-01 DIAGNOSIS — F17200 Nicotine dependence, unspecified, uncomplicated: Secondary | ICD-10-CM | POA: Diagnosis not present

## 2012-09-01 LAB — BASIC METABOLIC PANEL WITH GFR
CO2: 25 mEq/L (ref 19–32)
Calcium: 9.7 mg/dL (ref 8.4–10.5)
Chloride: 102 mEq/L (ref 96–112)
Sodium: 135 mEq/L (ref 135–145)

## 2012-09-01 MED ORDER — METOPROLOL TARTRATE 50 MG PO TABS: 50.0000 mg | ORAL_TABLET | Freq: Two times a day (BID) | ORAL | Status: DC

## 2012-09-01 NOTE — Progress Notes (Signed)
Case discussed with Dr. Sherrine Maples (at time of visit, soon after the resident saw the patient).  We reviewed the resident's history and exam and pertinent patient test results.  I agree with the assessment, diagnosis, and plan of care documented in the resident's note.

## 2012-09-01 NOTE — Progress Notes (Signed)
Patient ID: [name], male   DOB: 1950/05/21, 62 y.o.   MRN: [id number]  Subjective:   Patient ID: [name] male   DOB: 1950-09-08 62 y.o.   MRN: [id number]  HPI: [name] is a 62 y.o. male with PMH atrial tachycardia, ED, HLD, and uncontrolled HTN.  Presents with 2 weeks of weakness and headaches at the base of his skull lasting for a few minutes that feels like he was hit in the back of the head.   He saw Dr. Dierdre Searles at the beginning of June, and his BP was 152/96, and he had been off his BP meds for approx 1-2 mo. Today he states that he has been off BP meds for a while until 1 week ago when he restarted his medications. He has an ACEi, BB, and diuretic. He states that he was taking 25mg  instead of 12.5mg  of the chlorthalidone.   Of note, he has a history of noncompliance with his medications.    Past Medical History  Diagnosis Date  . Tuberculosis at age 19-3  . Sickle cell trait   . Stroke 10/03/08    Right thalamus  . Hypertension     goal < 140/90  . Emphysema     per multiple CXRs  . Snoring     Sleep study 09/02/10 was WNL  . CEREBROVASCULAR ACCIDENT 10/17/2008    Acute CVA of right thalamus 10/03/08 ECHO performed 2/2 CVA 10/09/08, EF 55-65%  Aspirin 325 mg daily and try to work on quitting smoking.    Current Outpatient Prescriptions  Medication Sig Dispense Refill  . aspirin 325 MG tablet Take 1 tablet (325 mg total) by mouth daily.  100 tablet  11  . chlorthalidone (HYGROTON) 25 MG tablet Take 0.5 tablets (12.5 mg total) by mouth daily.  15 tablet  11  . guaiFENesin (MUCINEX) 600 MG 12 hr tablet Take 1 tablet (600 mg total) by mouth 2 (two) times daily as needed for congestion.  60 tablet  2  . lisinopril (PRINIVIL,ZESTRIL) 20 MG tablet Take 1 tablet (20 mg total) by mouth daily.  30 tablet  11  . metoprolol (LOPRESSOR) 50 MG tablet Take 1.5 tablets (75 mg total) by mouth 2 (two) times daily.  60 tablet  11  . pravastatin (PRAVACHOL) 40 MG tablet Take  1 tablet (40 mg total) by mouth every evening.  30 tablet  11  . sildenafil (VIAGRA) 50 MG tablet Take 1 tablet (50 mg total) by mouth as needed for erectile dysfunction.  20 tablet  0   No current facility-administered medications for this visit.   Family History  Problem Relation Age of Onset  . Cancer Sister   . Sickle cell anemia Son    History   Social History  . Marital Status: Married    Spouse Name: N/A    Number of Children: N/A  . Years of Education: N/A   Occupational History  . unemployed    Social History Main Topics  . Smoking status: Current Every Day Smoker -- 0.10 packs/day for 10 years    Types: Cigarettes  . Smokeless tobacco: None     Comment: Cutting back.  . Alcohol Use: No  . Drug Use: No     Comment: previous polysubstance abuser, quit x 13 years  . Sexually Active: None   Other Topics Concern  . None   Social History Narrative   Lives in [place].  Works as a Pharmacist, hospital for  [employer]   Review of Systems: A 10 point ROS was performed; pertinent positives and negatives were noted in the HPI   Objective:  Physical Exam: Filed Vitals:   09/01/12 1601  BP: 104/78  Pulse: 82  Temp: 97.2 F (36.2 C)  TempSrc: Oral  Height: 6' 1.25" (1.861 m)  Weight: 161 lb 12.8 oz (73.392 kg)  SpO2: 100%   Constitutional: Vital signs reviewed.  Patient is a well-developed and well-nourished male in no acute distress and cooperative with exam. Alert and oriented x3.  Head: Normocephalic and atraumatic Mouth: no erythema or exudates, MMM Eyes: PERRL, EOMI, conjunctivae normal, No scleral icterus.  Neck: Supple, Trachea midline normal ROM, No JVD, mass, thyromegaly, or carotid bruit present.  Cardiovascular: RRR, S1 normal, S2 normal, no MRG, pulses symmetric and intact bilaterally Pulmonary/Chest: normal respiratory effort, diminished breath sounds. CTAB, no wheezes, rales, or rhonchi Abdominal: Soft. Non-tender, non-distended, bowel  sounds are normal, no masses, organomegaly, or guarding present.  GU: no CVA tenderness Musculoskeletal: No joint deformities, erythema, or stiffness, ROM full and no nontender Hematology: no cervical, inginal, or axillary adenopathy.  Neurological: A&O x3, Strength is normal and symmetric bilaterally, cranial nerve II-XII are grossly intact, no focal motor deficit, sensory intact to light touch bilaterally.  Skin: Warm, dry and intact. No rash, cyanosis, or clubbing.  Psychiatric: Normal mood and affect. speech and behavior is normal. Judgment and thought content normal. Cognition and memory are normal.   Assessment & Plan:   Please refer to Problem List based Assessment and Plan

## 2012-09-01 NOTE — Patient Instructions (Signed)
**Take 1 tablet (50mg ) of the Lopressor (metoprolol) 2 times a day. Lets see if this improves your fatigue- it might be that your blood pressure has been too low. I am also checking your electrolytes to make sure that they are normal.   **Try to work on cutting back on smoking and ultimately quitting when you are ready.    Smoking Cessation, Tips for Success YOU CAN QUIT SMOKING If you are ready to quit smoking, congratulations! You have chosen to help yourself be healthier. Cigarettes bring nicotine, tar, carbon monoxide, and other irritants into your body. Your lungs, heart, and blood vessels will be able to work better without these poisons. There are many different ways to quit smoking. Nicotine gum, nicotine patches, a nicotine inhaler, or nicotine nasal spray can help with physical craving. Hypnosis, support groups, and medicines help break the habit of smoking. Here are some tips to help you quit for good.  Throw away all cigarettes.  Clean and remove all ashtrays from your home, work, and car.  On a card, write down your reasons for quitting. Carry the card with you and read it when you get the urge to smoke.  Cleanse your body of nicotine. Drink enough water and fluids to keep your urine clear or pale yellow. Do this after quitting to flush the nicotine from your body.  Learn to predict your moods. Do not let a bad situation be your excuse to have a cigarette. Some situations in your life might tempt you into wanting a cigarette.  Never have "just one" cigarette. It leads to wanting another and another. Remind yourself of your decision to quit.  Change habits associated with smoking. If you smoked while driving or when feeling stressed, try other activities to replace smoking. Stand up when drinking your coffee. Brush your teeth after eating. Sit in a different chair when you read the paper. Avoid alcohol while trying to quit, and try to drink fewer caffeinated beverages. Alcohol and  caffeine may urge you to smoke.  Avoid foods and drinks that can trigger a desire to smoke, such as sugary or spicy foods and alcohol.  Ask people who smoke not to smoke around you.  Have something planned to do right after eating or having a cup of coffee. Take a walk or exercise to perk you up. This will help to keep you from overeating.  Try a relaxation exercise to calm you down and decrease your stress. Remember, you may be tense and nervous for the first 2 weeks after you quit, but this will pass.  Find new activities to keep your hands busy. Play with a pen, coin, or rubber band. Doodle or draw things on paper.  Brush your teeth right after eating. This will help cut down on the craving for the taste of tobacco after meals. You can try mouthwash, too.  Use oral substitutes, such as lemon drops, carrots, a cinnamon stick, or chewing gum, in place of cigarettes. Keep them handy so they are available when you have the urge to smoke.  When you have the urge to smoke, try deep breathing.  Designate your home as a nonsmoking area.  If you are a heavy smoker, ask your caregiver about a prescription for nicotine chewing gum. It can ease your withdrawal from nicotine.  Reward yourself. Set aside the cigarette money you save and buy yourself something nice.  Look for support from others. Join a support group or smoking cessation program. Ask someone at home  or at work to help you with your plan to quit smoking.  Always ask yourself, "Do I need this cigarette or is this just a reflex?" Tell yourself, "Today, I choose not to smoke," or "I do not want to smoke." You are reminding yourself of your decision to quit, even if you do smoke a cigarette. HOW WILL I FEEL WHEN I QUIT SMOKING?  The benefits of not smoking start within days of quitting.  You may have symptoms of withdrawal because your body is used to nicotine (the addictive substance in cigarettes). You may crave cigarettes, be  irritable, feel very hungry, cough often, get headaches, or have difficulty concentrating.  The withdrawal symptoms are only temporary. They are strongest when you first quit but will go away within 10 to 14 days.  When withdrawal symptoms occur, stay in control. Think about your reasons for quitting. Remind yourself that these are signs that your body is healing and getting used to being without cigarettes.  Remember that withdrawal symptoms are easier to treat than the major diseases that smoking can cause.  Even after the withdrawal is over, expect periodic urges to smoke. However, these cravings are generally short-lived and will go away whether you smoke or not. Do not smoke!  If you relapse and smoke again, do not lose hope. Most smokers quit 3 times before they are successful.  If you relapse, do not give up! Plan ahead and think about what you will do the next time you get the urge to smoke. LIFE AS A NONSMOKER: MAKE IT FOR A MONTH, MAKE IT FOR LIFE Day 1: Hang this page where you will see it every day. Day 2: Get rid of all ashtrays, matches, and lighters. Day 3: Drink water. Breathe deeply between sips. Day 4: Avoid places with smoke-filled air, such as bars, clubs, or the smoking section of restaurants. Day 5: Keep track of how much money you save by not smoking. Day 6: Avoid boredom. Keep a good book with you or go to the movies. Day 7: Reward yourself! One week without smoking! Day 8: Make a dental appointment to get your teeth cleaned. Day 9: Decide how you will turn down a cigarette before it is offered to you. Day 10: Review your reasons for quitting. Day 11: Distract yourself. Stay active to keep your mind off smoking and to relieve tension. Take a walk, exercise, read a book, do a crossword puzzle, or try a new hobby. Day 12: Exercise. Get off the bus before your stop or use stairs instead of escalators. Day 13: Call on friends for support and encouragement. Day 14:  Reward yourself! Two weeks without smoking! Day 15: Practice deep breathing exercises. Day 16: Bet a friend that you can stay a nonsmoker. Day 17: Ask to sit in nonsmoking sections of restaurants. Day 18: Hang up "No Smoking" signs. Day 19: Think of yourself as a nonsmoker. Day 20: Each morning, tell yourself you will not smoke. Day 21: Reward yourself! Three weeks without smoking! Day 22: Think of smoking in negative ways. Remember how it stains your teeth, gives you bad breath, and leaves you short of breath. Day 23: Eat a nutritious breakfast. Day 24:Do not relive your days as a smoker. Day 25: Hold a pencil in your hand when talking on the telephone. Day 26: Tell all your friends you do not smoke. Day 27: Think about how much better food tastes. Day 28: Remember, one cigarette is one too many. Day 29:  Take up a hobby that will keep your hands busy. Day 30: Congratulations! One month without smoking! Give yourself a big reward. Your caregiver can direct you to community resources or hospitals for support, which may include:  Group support.  Education.  Hypnosis.  Subliminal therapy. Document Released: 11/22/2003 Document Revised: 05/18/2011 Document Reviewed: 12/10/2008 [facility] Patient Information 2014 [place], [place].  Smoking Cessation Quitting smoking is important to your health and has many advantages. However, it is not always easy to quit since nicotine is a very addictive drug. Often times, people try 3 times or more before being able to quit. This document explains the best ways for you to prepare to quit smoking. Quitting takes hard work and a lot of effort, but you can do it. ADVANTAGES OF QUITTING SMOKING  You will live longer, feel better, and live better.  Your body will feel the impact of quitting smoking almost immediately.  Within 20 minutes, blood pressure decreases. Your pulse returns to its normal level.  After 8 hours, carbon monoxide levels in the blood  return to normal. Your oxygen level increases.  After 24 hours, the chance of having a heart attack starts to decrease. Your breath, hair, and body stop smelling like smoke.  After 48 hours, damaged nerve endings begin to recover. Your sense of taste and smell improve.  After 72 hours, the body is virtually free of nicotine. Your bronchial tubes relax and breathing becomes easier.  After 2 to 12 weeks, lungs can hold more air. Exercise becomes easier and circulation improves.  The risk of having a heart attack, stroke, cancer, or lung disease is greatly reduced.  After 1 year, the risk of coronary heart disease is cut in half.  After 5 years, the risk of stroke falls to the same as a nonsmoker.  After 10 years, the risk of lung cancer is cut in half and the risk of other cancers decreases significantly.  After 15 years, the risk of coronary heart disease drops, usually to the level of a nonsmoker.  If you are pregnant, quitting smoking will improve your chances of having a healthy baby.  The people you live with, especially any children, will be healthier.  You will have extra money to spend on things other than cigarettes. QUESTIONS TO THINK ABOUT BEFORE ATTEMPTING TO QUIT You may want to talk about your answers with your caregiver.  Why do you want to quit?  If you tried to quit in the past, what helped and what did not?  What will be the most difficult situations for you after you quit? How will you plan to handle them?  Who can help you through the tough times? Your family? Friends? A caregiver?  What pleasures do you get from smoking? What ways can you still get pleasure if you quit? Here are some questions to ask your caregiver:  How can you help me to be successful at quitting?  What medicine do you think would be best for me and how should I take it?  What should I do if I need more help?  What is smoking withdrawal like? How can I get information on  withdrawal? GET READY  Set a quit date.  Change your environment by getting rid of all cigarettes, ashtrays, matches, and lighters in your home, car, or work. Do not let people smoke in your home.  Review your past attempts to quit. Think about what worked and what did not. GET SUPPORT AND ENCOURAGEMENT You have a  better chance of being successful if you have help. You can get support in many ways.  Tell your family, friends, and co-workers that you are going to quit and need their support. Ask them not to smoke around you.  Get individual, group, or telephone counseling and support. Programs are available at [employer] and health centers. Call your local health department for information about programs in your area.  Spiritual beliefs and practices may help some smokers quit.  Download a "quit meter" on your computer to keep track of quit statistics, such as how long you have gone without smoking, cigarettes not smoked, and money saved.  Get a self-help book about quitting smoking and staying off of tobacco. LEARN NEW SKILLS AND BEHAVIORS  Distract yourself from urges to smoke. Talk to someone, go for a walk, or occupy your time with a task.  Change your normal routine. Take a different route to work. Drink tea instead of coffee. Eat breakfast in a different place.  Reduce your stress. Take a hot bath, exercise, or read a book.  Plan something enjoyable to do every day. Reward yourself for not smoking.  Explore interactive web-based programs that specialize in helping you quit. GET MEDICINE AND USE IT CORRECTLY Medicines can help you stop smoking and decrease the urge to smoke. Combining medicine with the above behavioral methods and support can greatly increase your chances of successfully quitting smoking.  Nicotine replacement therapy helps deliver nicotine to your body without the negative effects and risks of smoking. Nicotine replacement therapy includes nicotine gum,  lozenges, inhalers, nasal sprays, and skin patches. Some may be available over-the-counter and others require a prescription.  Antidepressant medicine helps people abstain from smoking, but how this works is unknown. This medicine is available by prescription.  Nicotinic receptor partial agonist medicine simulates the effect of nicotine in your brain. This medicine is available by prescription. Ask your caregiver for advice about which medicines to use and how to use them based on your health history. Your caregiver will tell you what side effects to look out for if you choose to be on a medicine or therapy. Carefully read the information on the package. Do not use any other product containing nicotine while using a nicotine replacement product.  RELAPSE OR DIFFICULT SITUATIONS Most relapses occur within the first 3 months after quitting. Do not be discouraged if you start smoking again. Remember, most people try several times before finally quitting. You may have symptoms of withdrawal because your body is used to nicotine. You may crave cigarettes, be irritable, feel very hungry, cough often, get headaches, or have difficulty concentrating. The withdrawal symptoms are only temporary. They are strongest when you first quit, but they will go away within 10 14 days. To reduce the chances of relapse, try to:  Avoid drinking alcohol. Drinking lowers your chances of successfully quitting.  Reduce the amount of caffeine you consume. Once you quit smoking, the amount of caffeine in your body increases and can give you symptoms, such as a rapid heartbeat, sweating, and anxiety.  Avoid smokers because they can make you want to smoke.  Do not let weight gain distract you. Many smokers will gain weight when they quit, usually less than 10 pounds. Eat a healthy diet and stay active. You can always lose the weight gained after you quit.  Find ways to improve your mood other than smoking. FOR MORE INFORMATION   www.smokefree.gov  Document Released: 02/17/2001 Document Revised: 08/25/2011 Document Reviewed: 06/04/2011  [employer] Patient Information 2014 [employer].  

## 2012-09-06 NOTE — Assessment & Plan Note (Signed)
  Assessment: Progress toward smoking cessation:  smoking the same amount Barriers to progress toward smoking cessation:  withdrawal symptoms;lack of motivation to quit Comments: Pt states that he is not ready to quit smoking.   Plan: Instruction/counseling given:  I counseled patient on the dangers of tobacco use, advised patient to stop smoking, and reviewed strategies to maximize success. Educational resources provided:  [employer]) brochure Self management tools provided:    Medications to assist with smoking cessation:  None at this time Patient agreed to the following self-care plans for smoking cessation:    Other plans: Pt states that he is not ready to quit

## 2012-09-06 NOTE — Assessment & Plan Note (Addendum)
BP Readings from Last 3 Encounters:  09/01/12 104/78  08/10/12 152/96  04/11/12 108/81    Lab Results  Component Value Date   NA 135 09/01/2012   K 4.3 09/01/2012   CREATININE 1.16 09/01/2012    Assessment: Blood pressure control: controlled Progress toward BP goal:  at goal Comments: BP is improved, but it is actually on the low end. I think this is contributing to his headaches and weakness. He has been taking all of his BP meds x1 week.  Plan: Medications:  Will decrease the Metoprolol to 50mg  BID from 75mg  BID. Pt is aware that he needs to take only 12.5mg  of the chlorthalidone.  Educational resources provided: Comptroller tools provided:   Other plans: Encouraged continued medication compliance. F/u in 1 month

## 2012-09-29 ENCOUNTER — Encounter: Payer: Self-pay | Admitting: Internal Medicine

## 2012-09-29 ENCOUNTER — Ambulatory Visit (INDEPENDENT_AMBULATORY_CARE_PROVIDER_SITE_OTHER): Payer: Medicare Other | Admitting: Internal Medicine

## 2012-09-29 VITALS — BP 100/70 | HR 78 | Temp 97.0°F | Ht 73.25 in | Wt 156.5 lb

## 2012-09-29 DIAGNOSIS — I10 Essential (primary) hypertension: Secondary | ICD-10-CM | POA: Diagnosis not present

## 2012-09-29 DIAGNOSIS — F17200 Nicotine dependence, unspecified, uncomplicated: Secondary | ICD-10-CM

## 2012-09-29 MED ORDER — METOPROLOL TARTRATE 25 MG PO TABS: 25.0000 mg | ORAL_TABLET | Freq: Two times a day (BID) | ORAL | Status: DC

## 2012-09-29 NOTE — Assessment & Plan Note (Signed)
  Assessment: Progress toward smoking cessation:  smoking the same amount Barriers to progress toward smoking cessation:  lack of motivation to quit Comments: No ready to quit  Plan: Instruction/counseling given:  I counseled patient on the dangers of tobacco use, advised patient to stop smoking, and reviewed strategies to maximize success. Educational resources provided:  [employer]) brochure Self management tools provided:    Medications to assist with smoking cessation:  None Patient agreed to the following self-care plans for smoking cessation:

## 2012-09-29 NOTE — Patient Instructions (Signed)
**  Stop taking the chlorthalidone.   **Start taking 25mg  2 times a day of the Lopressor  **I want to see you back in 1 month and we will check your blood pressure and draw labs at that time.

## 2012-09-29 NOTE — Progress Notes (Signed)
Patient ID: [name], male   DOB: 1950/08/07, 62 y.o.   MRN: [id number]  Subjective:   Patient ID: [name] male   DOB: Jun 17, 1950 62 y.o.   MRN: [id number]  HPI: [name] is a 62 y.o. M with PMH HTN, tobacco abuse, atrial tachycardia, ED, and HLD, who presents for a routine follow up.   He was seen in June with hypotension, likely 2/2 increased BP medications in the setting of mediation noncompliance. He endorses recent compliance with his medications and has had some dizziness and hypotension since. His Metoprolol was decreased at his last visit to 50mg  BID. Despite this, he continues to have dizziness.   He continues to smoke 6 cig/day   Past Medical History  Diagnosis Date  . Tuberculosis at age 16-3  . Sickle cell trait   . Stroke 10/03/08    Right thalamus  . Hypertension     goal < 140/90  . Emphysema     per multiple CXRs  . Snoring     Sleep study 09/02/10 was WNL  . CEREBROVASCULAR ACCIDENT 10/17/2008    Acute CVA of right thalamus 10/03/08 ECHO performed 2/2 CVA 10/09/08, EF 55-65%  Aspirin 325 mg daily and try to work on quitting smoking.    Current Outpatient Prescriptions  Medication Sig Dispense Refill  . aspirin 325 MG tablet Take 1 tablet (325 mg total) by mouth daily.  100 tablet  11  . chlorthalidone (HYGROTON) 25 MG tablet Take 0.5 tablets (12.5 mg total) by mouth daily.  15 tablet  11  . guaiFENesin (MUCINEX) 600 MG 12 hr tablet Take 1 tablet (600 mg total) by mouth 2 (two) times daily as needed for congestion.  60 tablet  2  . lisinopril (PRINIVIL,ZESTRIL) 20 MG tablet Take 1 tablet (20 mg total) by mouth daily.  30 tablet  11  . metoprolol (LOPRESSOR) 50 MG tablet Take 1 tablet (50 mg total) by mouth 2 (two) times daily.  60 tablet  11  . pravastatin (PRAVACHOL) 40 MG tablet Take 1 tablet (40 mg total) by mouth every evening.  30 tablet  11  . sildenafil (VIAGRA) 50 MG tablet Take 1 tablet (50 mg total) by mouth as needed for erectile  dysfunction.  20 tablet  0   No current facility-administered medications for this visit.   Family History  Problem Relation Age of Onset  . Cancer Sister   . Sickle cell anemia Son    History   Social History  . Marital Status: Married    Spouse Name: N/A    Number of Children: N/A  . Years of Education: N/A   Occupational History  . unemployed    Social History Main Topics  . Smoking status: Current Every Day Smoker -- 0.10 packs/day for 10 years    Types: Cigarettes  . Smokeless tobacco: None     Comment: Cutting back.  . Alcohol Use: No  . Drug Use: No     Comment: previous polysubstance abuser, quit x 13 years  . Sexually Active: None   Other Topics Concern  . None   Social History Narrative   Lives in [place].  Works as a Pharmacist, hospital for [employer]   Review of Systems: Constitutional: Denies fever, chills, diaphoresis, appetite change and fatigue.  HEENT: Denies ear pain, congestion, sore throat, rhinorrhea, sneezing, mouth sores, trouble swallowing, neck pain Respiratory: Denies SOB, DOE, cough, chest tightness,  and wheezing.   Cardiovascular: Denies chest  pain, palpitations and leg swelling.  Gastrointestinal: Denies nausea, vomiting, abdominal pain, diarrhea, constipation, blood in stool and abdominal distention.  Genitourinary: Denies dysuria, urgency, frequency, hematuria, flank pain and difficulty urinating.  Endocrine: Denies: hot or cold intolerance, sweats, changes in hair or nails, polyuria, polydipsia. Musculoskeletal: + back pain. Denies joint swelling, arthralgias and gait problem.  Skin: Denies pallor, rash and wound.  Neurological: + dizziness. Denies syncope, weakness, numbness. Psychiatric/Behavioral: Mood changes, confusion, nervousness, sleep disturbance and agitation  Objective:  Physical Exam: Filed Vitals:   09/29/12 1408  BP: 100/70  Pulse: 78  Temp: 97 F (36.1 C)  TempSrc: Oral  Height: 6' 1.25" (1.861 m)   Weight: 156 lb 8 oz (70.988 kg)  SpO2: 100%   Constitutional: Vital signs reviewed.  Patient is a well-developed and well-nourished male in no acute distress and cooperative with exam. Alert.  Head: Normocephalic and atraumatic Eyes: PERRL, EOMI, conjunctivae normal, No scleral icterus.  Cardiovascular: RRR, S1 normal, S2 normal, no MRG, pulses symmetric and intact bilaterally Pulmonary/Chest: normal respiratory effort, CTAB, no wheezes, rales, or rhonchi Abdominal: Soft. Non-tender, non-distended, bowel sounds are normal Musculoskeletal: No joint deformities, erythema, or stiffness, ROM full  Neurological: A&O x3, Strength is normal and symmetric bilaterally, cranial nerve II-XII are grossly intact, no focal motor deficit, sensory intact to light touch bilaterally.  Skin: Warm, dry and intact. No rash, cyanosis, or clubbing.  Psychiatric: Normal mood and affect. speech and behavior is normal. Judgment and thought content normal. Cognition and memory are normal.   Assessment & Plan:   Please refer to Problem List based Assessment and Plan

## 2012-09-29 NOTE — Assessment & Plan Note (Addendum)
BP Readings from Last 3 Encounters:  09/29/12 100/70  09/01/12 104/78  08/10/12 152/96    Lab Results  Component Value Date   NA 135 09/01/2012   K 4.3 09/01/2012   CREATININE 1.16 09/01/2012    Assessment: Blood pressure control:  (Mild hypotension) Progress toward BP goal:  unchanged Comments: Still with mild hypotension and dizziness despite decreasing his Lopressor at the last clinic visit. Suspect sx related to hypotension and probably some dehydration, as his BUN/Cr were a little elevated (but WNL) at his last visit.   Plan: Medications:  Stop taking the Chlorthalidone. Begin taking the Lopressor 25mg  twice a day Educational resources provided: brochure;handout;video Self management tools provided:   Other plans: F/u in 1 mo and will check BMP at that time  Of note, the pt is aware of the potential for worsening hypotension if he takes the Viagra. He states that he is not currently taking the medication.

## 2012-10-23 ENCOUNTER — Emergency Department (HOSPITAL_COMMUNITY): Payer: Medicare Other

## 2012-10-23 ENCOUNTER — Encounter (HOSPITAL_COMMUNITY): Payer: Self-pay

## 2012-10-23 ENCOUNTER — Emergency Department (HOSPITAL_COMMUNITY)
Admission: EM | Admit: 2012-10-23 | Discharge: 2012-10-23 | Disposition: A | Discharge status: Home / Self Care | Payer: Medicare Other | Attending: Emergency Medicine | Admitting: Emergency Medicine

## 2012-10-23 DIAGNOSIS — Z8611 Personal history of tuberculosis: Secondary | ICD-10-CM | POA: Insufficient documentation

## 2012-10-23 DIAGNOSIS — Z862 Personal history of diseases of the blood and blood-forming organs and certain disorders involving the immune mechanism: Secondary | ICD-10-CM | POA: Diagnosis not present

## 2012-10-23 DIAGNOSIS — Z8673 Personal history of transient ischemic attack (TIA), and cerebral infarction without residual deficits: Secondary | ICD-10-CM | POA: Insufficient documentation

## 2012-10-23 DIAGNOSIS — Z7982 Long term (current) use of aspirin: Secondary | ICD-10-CM | POA: Diagnosis not present

## 2012-10-23 DIAGNOSIS — Z950 Presence of cardiac pacemaker: Secondary | ICD-10-CM | POA: Diagnosis not present

## 2012-10-23 DIAGNOSIS — F17200 Nicotine dependence, unspecified, uncomplicated: Secondary | ICD-10-CM | POA: Diagnosis not present

## 2012-10-23 DIAGNOSIS — J438 Other emphysema: Secondary | ICD-10-CM | POA: Diagnosis not present

## 2012-10-23 DIAGNOSIS — Z8709 Personal history of other diseases of the respiratory system: Secondary | ICD-10-CM | POA: Diagnosis not present

## 2012-10-23 DIAGNOSIS — Z8669 Personal history of other diseases of the nervous system and sense organs: Secondary | ICD-10-CM | POA: Insufficient documentation

## 2012-10-23 DIAGNOSIS — I10 Essential (primary) hypertension: Secondary | ICD-10-CM

## 2012-10-23 DIAGNOSIS — R071 Chest pain on breathing: Secondary | ICD-10-CM | POA: Insufficient documentation

## 2012-10-23 DIAGNOSIS — R0789 Other chest pain: Secondary | ICD-10-CM

## 2012-10-23 DIAGNOSIS — Z79899 Other long term (current) drug therapy: Secondary | ICD-10-CM | POA: Insufficient documentation

## 2012-10-23 LAB — CBC
HCT: 32.7 % — ABNORMAL LOW (ref 39.0–52.0)
Hemoglobin: 11.3 g/dL — ABNORMAL LOW (ref 13.0–17.0)
MCHC: 34.6 g/dL (ref 30.0–36.0)

## 2012-10-23 LAB — BASIC METABOLIC PANEL
BUN: 15 mg/dL (ref 6–23)
CO2: 27 mEq/L (ref 19–32)
Chloride: 104 mEq/L (ref 96–112)
GFR calc non Af Amer: 89 mL/min — ABNORMAL LOW (ref >90)
Glucose, Bld: 91 mg/dL (ref 70–99)
Potassium: 3.3 mEq/L — ABNORMAL LOW (ref 3.5–5.1)

## 2012-10-23 LAB — POCT I-STAT TROPONIN I: Troponin i, poc: 0.02 ng/mL (ref 0.00–0.08)

## 2012-10-23 MED ORDER — NAPROXEN 500 MG PO TABS: 500.0000 mg | ORAL_TABLET | Freq: Two times a day (BID) | ORAL | Status: DC

## 2012-10-23 MED ORDER — MORPHINE SULFATE 2 MG/ML IJ SOLN
2.0000 mg | Freq: Once | INTRAMUSCULAR | Status: AC
  Administered 2012-10-23: 2 mg via INTRAVENOUS
  Filled 2012-10-23: qty 1

## 2012-10-23 MED ORDER — ASPIRIN 81 MG PO CHEW
324.0000 mg | CHEWABLE_TABLET | Freq: Once | ORAL | Status: AC
  Administered 2012-10-23: 324 mg via ORAL
  Filled 2012-10-23: qty 4

## 2012-10-23 NOTE — ED Notes (Signed)
Per pt, chest pain starting 1 hour ago.  Pt has pacemaker.  Pt states pain under rt arm radiating across chest. Pain 7/10.  Pacemaker on demand for bradycardia.  HR 67 at this time.  No n/v.  No cough.  No strenuous activity lately.  No numbness tingling down arm.  Hx stroke

## 2012-10-23 NOTE — ED Provider Notes (Signed)
CSN: [id number]     Arrival date & time 10/23/12  0213 History     First MD Initiated Contact with Patient 10/23/12 0301     Chief Complaint  Patient presents with  . Chest Pain   (Consider location/radiation/quality/duration/timing/severity/associated sxs/prior Treatment) HPI 62 yo male presents to the ER from home with complaint of chest pain.  Pain started in his right armpit and has extended over to the center of chest.  No shortness of breath, nausea, diaphoresis.  He has had this pain in the past, but never had it checked out.  Pt has h/o HTN, stroke, HDL, and pacemaker due to bradycardia.  No prior CAD.  Pt is smoker.  No family history of CAD.  Pain is dull, mild. Past Medical History  Diagnosis Date  . Tuberculosis at age 82-3  . Sickle cell trait   . Stroke 10/03/08    Right thalamus  . Hypertension     goal < 140/90  . Emphysema     per multiple CXRs  . Snoring     Sleep study 09/02/10 was WNL  . CEREBROVASCULAR ACCIDENT 10/17/2008    Acute CVA of right thalamus 10/03/08 ECHO performed 2/2 CVA 10/09/08, EF 55-65%  Aspirin 325 mg daily and try to work on quitting smoking.    Past Surgical History  Procedure Laterality Date  . Appendectomy  1977  . Vasectomy  1990  . Pacemaker insertion  07/24/08    MDT Adapta L implanted by Dr Fredrich Birks   Family History  Problem Relation Age of Onset  . Cancer Sister   . Sickle cell anemia Son    History  Substance Use Topics  . Smoking status: Current Every Day Smoker -- 0.10 packs/day for 10 years    Types: Cigarettes  . Smokeless tobacco: Not on file     Comment: Cutting back.  . Alcohol Use: No    Review of Systems  All other systems reviewed and are negative.    Allergies  Review of patient's allergies indicates no known allergies.  Home Medications   Current Outpatient Rx  Name  Route  Sig  Dispense  Refill  . aspirin 325 MG tablet   Oral   Take 325 mg by mouth every morning.         . escitalopram (LEXAPRO) 20  MG tablet   Oral   Take 20 mg by mouth every morning.         Marland Kitchen lisinopril (PRINIVIL,ZESTRIL) 20 MG tablet   Oral   Take 20 mg by mouth every morning.         . metoprolol tartrate (LOPRESSOR) 25 MG tablet   Oral   Take 12.5 mg by mouth 2 (two) times daily.          BP 146/102  Pulse 60  Temp(Src) 98.1 F (36.7 C) (Oral)  Resp 18  Ht 6\' 1"  (1.854 m)  Wt 165 lb (74.844 kg)  BMI 21.77 kg/m2  SpO2 100% Physical Exam  Nursing note and vitals reviewed. Constitutional: He is oriented to person, place, and time. He appears well-developed and well-nourished.  HENT:  Head: Normocephalic and atraumatic.  Right Ear: External ear normal.  Left Ear: External ear normal.  Nose: Nose normal.  Mouth/Throat: Oropharynx is clear and moist.  Eyes: Conjunctivae and EOM are normal. Pupils are equal, round, and reactive to light.  Neck: Normal range of motion. Neck supple. No JVD present. No tracheal deviation present. No thyromegaly present.  Cardiovascular:  Normal rate, regular rhythm, normal heart sounds and intact distal pulses.  Exam reveals no gallop and no friction rub.   No murmur heard. Pulmonary/Chest: Effort normal and breath sounds normal. No stridor. No respiratory distress. He has no wheezes. He has no rales. He exhibits tenderness (ttp over right chest just under nipple which reproduces his pain.  No deformity, no ovelrying skin changes).  Abdominal: Soft. Bowel sounds are normal. He exhibits no distension and no mass. There is no tenderness. There is no rebound and no guarding.  Musculoskeletal: Normal range of motion. He exhibits no edema and no tenderness.  Lymphadenopathy:    He has no cervical adenopathy.  Neurological: He is alert and oriented to person, place, and time. He has normal reflexes. No cranial nerve deficit. He exhibits normal muscle tone. Coordination normal.  Skin: Skin is warm and dry. No rash noted. No erythema. No pallor.  Psychiatric: He has a normal  mood and affect. His behavior is normal. Judgment and thought content normal.    ED Course   Procedures (including critical care time)  Labs Reviewed  CBC - Abnormal; Notable for the following:    RBC 4.00 (*)    Hemoglobin 11.3 (*)    HCT 32.7 (*)    All other components within normal limits  BASIC METABOLIC PANEL - Abnormal; Notable for the following:    Potassium 3.3 (*)    GFR calc non Af Amer 89 (*)    All other components within normal limits  POCT I-STAT TROPONIN I   Dg Chest Port 1 View  10/23/2012   *RADIOLOGY REPORT*  Clinical Data: Chest pain  PORTABLE CHEST - 1 VIEW  Comparison: Most recent prior chest x-ray 09/13/2011  Findings: Interval development of bibasilar opacities and pulmonary vascular congestion.  Lung volumes are slightly lower compared to prior.  2 HU of COPD and emphysema with bullous change in the right lung apex and medial right upper lobe are similar compared to prior. Stable cardiac and mediastinal contours.  Left subclavian approach cardiac rhythm maintenance device with leads projecting over the right atrium and right ventricle.  IMPRESSION:  1.  Right greater than left basilar opacities may reflect atelectasis or infiltrate. 2.  Mild pulmonary vascular congestion is increased compared to prior. 3.  Background COPD and emphysema   Original Report Authenticated By: Malachy Moan, M.D.    Date: 10/23/2012  Rate: 63  Rhythm: atrial pacing  QRS Axis: normal  Intervals: normal  ST/T Wave abnormalities: normal  Conduction Disutrbances:none  Narrative Interpretation:   Old EKG Reviewed: unchanged    1. Chest wall pain   2. Hypertension     MDM  62 yo male with right sided chest pain, appears to be chest wall pain.  No h/o CAD.  No abn on EKG, two troponins negative.  CXR with atelectasis vs infiltrate, no cough, no fever, no sob, do not feel pna is likely.  Pt has htn here, reports he is noncompliant with his meds.  Will refer him to his pcm for  recheck.   Olivia Mackie, MD 10/23/12 [phone]

## 2012-10-25 ENCOUNTER — Encounter: Payer: Self-pay | Admitting: Internal Medicine

## 2012-10-25 ENCOUNTER — Ambulatory Visit (INDEPENDENT_AMBULATORY_CARE_PROVIDER_SITE_OTHER): Payer: Medicare Other | Admitting: Internal Medicine

## 2012-10-25 VITALS — BP 167/113 | HR 64 | Temp 97.3°F | Ht 73.25 in | Wt 160.1 lb

## 2012-10-25 DIAGNOSIS — G43009 Migraine without aura, not intractable, without status migrainosus: Secondary | ICD-10-CM

## 2012-10-25 DIAGNOSIS — I10 Essential (primary) hypertension: Secondary | ICD-10-CM

## 2012-10-25 NOTE — Progress Notes (Signed)
Case discussed with Dr. Kazibwe soon after the resident saw the patient.  We reviewed the resident's history and exam and pertinent patient test results.  I agree with the assessment, diagnosis, and plan of care documented in the resident's note. 

## 2012-10-25 NOTE — Progress Notes (Signed)
Patient ID: [name], male   DOB: [birth year], 62 y.o.   MRN: [id number]   Subjective:   HPI: [name] is a 62 y.o. gentleman with past medical history of previous stroke, hypertension, status post pacemaker placement, history of polysubstance abuse, presents to the clinic for followup visit.  He was evaluated at [facility] ED for right-sided chest pain 2 days ago. Cardiac evaluation was negative including an EKG, and troponins. His BMP revealed hypokalemia of 3.3, but otherwise normal. CXR - no acute pulmonary process but was noted to have bilateral atelectasis, right more than left. In the end, it was felt that his chest pain was musculoskeletal in nature. He was requested to follow up with this clinic. He reports that his chest pain has significantly improved. No shortness of breath, wheezing, cough, fevers, chills, rigors, nausea, or vomiting.  In terms of his high blood pressure, he reports, that he has not been taking his lisinopril and metoprolol, for more than one month due to declining sexual function, which he thinks is a side effect of these medication.  He has both his medications at home. He is been married to his second wife who at 62 years of age is younger than him. He therefore feels his sexual life with his younger wife is very important to him at this time.    Past Medical History  Diagnosis Date  . Tuberculosis at age 56-3  . Sickle cell trait   . Stroke 10/03/08    Right thalamus  . Hypertension     goal < 140/90  . Emphysema     per multiple CXRs  . Snoring     Sleep study 09/02/10 was WNL  . CEREBROVASCULAR ACCIDENT 10/17/2008    Acute CVA of right thalamus 10/03/08 ECHO performed 2/2 CVA 10/09/08, EF 55-65%  Aspirin 325 mg daily and try to work on quitting smoking.    Current Outpatient Prescriptions  Medication Sig Dispense Refill  . aspirin 325 MG tablet Take 325 mg by mouth every morning.      Marland Kitchen lisinopril (PRINIVIL,ZESTRIL) 20 MG tablet Take 20  mg by mouth every morning.      . metoprolol tartrate (LOPRESSOR) 25 MG tablet Take 12.5 mg by mouth 2 (two) times daily.       No current facility-administered medications for this visit.   Family History  Problem Relation Age of Onset  . Cancer Sister   . Sickle cell anemia Son    History   Social History  . Marital Status: Married    Spouse Name: N/A    Number of Children: N/A  . Years of Education: N/A   Occupational History  . unemployed    Social History Main Topics  . Smoking status: Current Every Day Smoker -- 0.10 packs/day for 10 years    Types: Cigarettes  . Smokeless tobacco: None     Comment: Cutting back.  . Alcohol Use: No  . Drug Use: No     Comment: previous polysubstance abuser, quit x 13 years  . Sexual Activity: None   Other Topics Concern  . None   Social History Narrative   Lives in [place].  Works as a Pharmacist, hospital for [employer]   Review of Systems: Cardiovascular: No palpitations and leg swelling.  Gastrointestinal: No abdominal pain, nausea, vomiting, bloody stools Genitourinary: No dysuria, frequency, hematuria, or flank pain.  Musculoskeletal: No myalgias, back pain, joint swelling, arthralgias    Objective:  Physical Exam:  Filed Vitals:   10/25/12 1354 10/25/12 1445 10/25/12 1447  BP: 155/100 161/111 167/113  Pulse: 83 66 64  Temp: 97.3 F (36.3 C)    TempSrc: Oral    Height: 6' 1.25" (1.861 m)    Weight: 160 lb 1.6 oz (72.621 kg)    SpO2: 99%     General: Pleasant AA man, well nourished. No acute distress.  Lungs: CTA bilaterally. Heart: RRR; no extra sounds or murmurs  Abdomen: Non-distended, normal BS, soft, nontender; no hepatosplenomegaly  Extremities: No pedal edema. No joint swelling or tenderness. Neurologic: Alert and oriented x3. No obvious neurologic deficits.  Assessment & Plan:  I have discussed my assessment and plan  with Dr. Aundria Rud  as detailed under problem based charting.

## 2012-10-25 NOTE — Patient Instructions (Signed)
General Instructions: You blood pressure is very high today  I would advise that at least you take the Lisinopril since you are concerned about your sexual function  Please stop smoking  Please keep your appointment with Dr Sherrine Maples on 8/28  Treatment Goals:  Goals (1 Years of Data) as of 10/25/12         As of Today As of Today As of Today 10/23/12 10/23/12     Blood Pressure    . Blood Pressure < 140/90  167/113 161/111 155/100 146/102 146/92      Progress Toward Treatment Goals:  Treatment Goal 10/25/2012  Blood pressure deteriorated  Stop smoking smoking the same amount    Self Care Goals & Plans:  Self Care Goal 10/25/2012  Manage my medications take my medicines as prescribed; bring my medications to every visit; refill my medications on time; follow the sick day instructions if I am sick  Monitor my health keep track of my blood pressure  Eat healthy foods eat more vegetables; eat fruit for snacks and desserts; eat smaller portions; eat foods that are low in salt  Be physically active take a walk every day; find an activity I enjoy       Care Management & Community Referrals:  Referral 10/25/2012  Referrals made for care management support -  Referrals made to community resources smoking cessation

## 2012-10-25 NOTE — Assessment & Plan Note (Addendum)
BP Readings from Last 3 Encounters:  10/25/12 167/113  10/23/12 146/102  09/29/12 100/70    Lab Results  Component Value Date   NA 139 10/23/2012   K 3.3* 10/23/2012   CREATININE 0.90 10/23/2012    Assessment: Blood pressure control: severely elevated Progress toward BP goal:  deteriorated Comments: reports erectile dysfunction associated with his HTN medication. He has been off for more than a month.  Plan: Medications:  I strongly encouraged the patient to at least take lisinopril given his elevated blood pressure.  Educational resources provided: Education officer, environmental tools provided: home blood pressure logbook Other plans: A quick check on Up-to-date indeed revealed that Metoprolol can cause erectile dysfunction, but not lisinopril. Informed the patient of this information and I encouraged him to at least take lisinopril. He said he will actually take both his medications.

## 2012-11-03 ENCOUNTER — Encounter: Payer: Self-pay | Admitting: Internal Medicine

## 2012-11-03 ENCOUNTER — Ambulatory Visit (INDEPENDENT_AMBULATORY_CARE_PROVIDER_SITE_OTHER): Payer: Medicare Other | Admitting: Internal Medicine

## 2012-11-03 VITALS — BP 143/93 | HR 88 | Temp 97.8°F | Ht 73.26 in | Wt 162.0 lb

## 2012-11-03 DIAGNOSIS — Z0000 Encounter for general adult medical examination without abnormal findings: Secondary | ICD-10-CM

## 2012-11-03 DIAGNOSIS — F17200 Nicotine dependence, unspecified, uncomplicated: Secondary | ICD-10-CM | POA: Diagnosis not present

## 2012-11-03 DIAGNOSIS — E785 Hyperlipidemia, unspecified: Secondary | ICD-10-CM | POA: Diagnosis not present

## 2012-11-03 DIAGNOSIS — Z299 Encounter for prophylactic measures, unspecified: Secondary | ICD-10-CM | POA: Diagnosis not present

## 2012-11-03 DIAGNOSIS — Z23 Encounter for immunization: Secondary | ICD-10-CM

## 2012-11-03 DIAGNOSIS — I10 Essential (primary) hypertension: Secondary | ICD-10-CM | POA: Diagnosis not present

## 2012-11-03 LAB — LIPID PANEL
Cholesterol: 168 mg/dL (ref 0–200)
HDL: 48 mg/dL (ref >39)
Triglycerides: 68 mg/dL (ref <150)

## 2012-11-03 MED ORDER — LISINOPRIL 20 MG PO TABS: 20.0000 mg | ORAL_TABLET | Freq: Every morning | ORAL | Status: DC

## 2012-11-03 MED ORDER — ZOSTER VACCINE LIVE 19400 UNT/0.65ML ~~LOC~~ SOLR: 0.6500 mL | Freq: Once | SUBCUTANEOUS | Status: DC

## 2012-11-03 MED ORDER — METOPROLOL TARTRATE 25 MG PO TABS: 12.5000 mg | ORAL_TABLET | Freq: Two times a day (BID) | ORAL | Status: DC

## 2012-11-03 NOTE — Progress Notes (Signed)
Patient ID: [name], male   DOB: [birth year], 62 y.o.   MRN: [id number]  Subjective:   Patient ID: [name] male   DOB: 09/16/1950 62 y.o.   MRN: [id number]  HPI: [name] is a 62 y.o. M with PMH atrial tachycardia with a pacer, h/o CVA '10, ED, HLD, and uncontrolled HTN presents for a blood pressure check.  He was seen in the clinic 10/25/12 after being evaluated in the ED for chest pain on 10/23/12. Cardiac workup was negative at that time. He denies any further chest pain since that time.  He states that he's been taking his blood pressure medication over the past month, but did run out 2 days ago. He states that his dizziness is improved from the last time I saw him in clinic. At that time we stopped his HCTZ.  Past Medical History  Diagnosis Date  . Tuberculosis at age 62-3  . Sickle cell trait   . Stroke 10/03/08    Right thalamus  . Hypertension     goal < 140/90  . Emphysema     per multiple CXRs  . Snoring     Sleep study 09/02/10 was WNL  . CEREBROVASCULAR ACCIDENT 10/17/2008    Acute CVA of right thalamus 10/03/08 ECHO performed 2/2 CVA 10/09/08, EF 55-65%  Aspirin 325 mg daily and try to work on quitting smoking.    Current Outpatient Prescriptions  Medication Sig Dispense Refill  . aspirin 325 MG tablet Take 325 mg by mouth every morning.      Marland Kitchen lisinopril (PRINIVIL,ZESTRIL) 20 MG tablet Take 20 mg by mouth every morning.      . metoprolol tartrate (LOPRESSOR) 25 MG tablet Take 12.5 mg by mouth 2 (two) times daily.       No current facility-administered medications for this visit.   Family History  Problem Relation Age of Onset  . Cancer Sister   . Sickle cell anemia Son    History   Social History  . Marital Status: Married    Spouse Name: N/A    Number of Children: N/A  . Years of Education: N/A   Occupational History  . unemployed    Social History Main Topics  . Smoking status: Current Every Day Smoker -- 0.10 packs/day for 10  years    Types: Cigarettes  . Smokeless tobacco: None     Comment: Cutting back.  . Alcohol Use: No  . Drug Use: No     Comment: previous polysubstance abuser, quit x 13 years  . Sexual Activity: None   Other Topics Concern  . None   Social History Narrative   Lives in [place].  Works as a Pharmacist, hospital for [employer]   Review of Systems: A 10 point ROS was performed; pertinent positives and negatives were noted in the HPI   Objective:  Physical Exam: Filed Vitals:   11/03/12 1336  BP: 143/93  Pulse: 88  Temp: 97.8 F (36.6 C)  TempSrc: Oral  Height: 6' 1.26" (1.861 m)  Weight: 162 lb (73.483 kg)  SpO2: 100%   Constitutional: Vital signs reviewed.  Patient is a well-developed and well-nourished male in no acute distress and cooperative with exam. Alert and oriented x3.  Head: Normocephalic and atraumatic Mouth: no erythema or exudates, MMM Eyes: PERRL, EOMI, conjunctivae normal, No scleral icterus.  Neck: Supple, Trachea midline normal ROM, no JVD Cardiovascular: RRR, S1 normal, S2 normal, no MRG, pulses symmetric and intact bilaterally Pulmonary/Chest:  normal respiratory effort, CTAB, no wheezes, rales, or rhonchi Abdominal: Soft. Non-tender, non-distended, bowel sounds are normal, no masses, organomegaly, or guarding present.  Musculoskeletal: No joint deformities, erythema, or stiffness, ROM full and no nontender Neurological: A&O x3, Strength is normal and symmetric bilaterally, cranial nerve II-XII are grossly intact, no focal motor deficit, sensory intact to light touch bilaterally.  Skin: Warm, dry and intact. No rash, cyanosis, or clubbing.  Psychiatric: Normal mood and affect. speech and behavior is normal.   Assessment & Plan:   Please refer to Problem List based Assessment and Plan

## 2012-11-03 NOTE — Assessment & Plan Note (Signed)
Flu vaccine given today. Rx given for shingles vaccine given today.

## 2012-11-03 NOTE — Patient Instructions (Addendum)
**Continue to take your blood pressure medicaitons. You blood rpessure was better today.  **Please continue to work on quitting smoking! This will improve your blood pressure, your breathing, and even your erectile dysfunction.Smoking Cessation, Tips for Success YOU CAN QUIT SMOKING If you are ready to quit smoking, congratulations! You have chosen to help yourself be healthier. Cigarettes bring nicotine, tar, carbon monoxide, and other irritants into your body. Your lungs, heart, and blood vessels will be able to work better without these poisons. There are many different ways to quit smoking. Nicotine gum, nicotine patches, a nicotine inhaler, or nicotine nasal spray can help with physical craving. Hypnosis, support groups, and medicines help break the habit of smoking. Here are some tips to help you quit for good.  Throw away all cigarettes.  Clean and remove all ashtrays from your home, work, and car.  On a card, write down your reasons for quitting. Carry the card with you and read it when you get the urge to smoke.  Cleanse your body of nicotine. Drink enough water and fluids to keep your urine clear or pale yellow. Do this after quitting to flush the nicotine from your body.  Learn to predict your moods. Do not let a bad situation be your excuse to have a cigarette. Some situations in your life might tempt you into wanting a cigarette.  Never have "just one" cigarette. It leads to wanting another and another. Remind yourself of your decision to quit.  Change habits associated with smoking. If you smoked while driving or when feeling stressed, try other activities to replace smoking. Stand up when drinking your coffee. Brush your teeth after eating. Sit in a different chair when you read the paper. Avoid alcohol while trying to quit, and try to drink fewer caffeinated beverages. Alcohol and caffeine may urge you to smoke.  Avoid foods and drinks that can trigger a desire to smoke, such as  sugary or spicy foods and alcohol.  Ask people who smoke not to smoke around you.  Have something planned to do right after eating or having a cup of coffee. Take a walk or exercise to perk you up. This will help to keep you from overeating.  Try a relaxation exercise to calm you down and decrease your stress. Remember, you may be tense and nervous for the first 2 weeks after you quit, but this will pass.  Find new activities to keep your hands busy. Play with a pen, coin, or rubber band. Doodle or draw things on paper.  Brush your teeth right after eating. This will help cut down on the craving for the taste of tobacco after meals. You can try mouthwash, too.  Use oral substitutes, such as lemon drops, carrots, a cinnamon stick, or chewing gum, in place of cigarettes. Keep them handy so they are available when you have the urge to smoke.  When you have the urge to smoke, try deep breathing.  Designate your home as a nonsmoking area.  If you are a heavy smoker, ask your caregiver about a prescription for nicotine chewing gum. It can ease your withdrawal from nicotine.  Reward yourself. Set aside the cigarette money you save and buy yourself something nice.  Look for support from others. Join a support group or smoking cessation program. Ask someone at home or at work to help you with your plan to quit smoking.  Always ask yourself, "Do I need this cigarette or is this just a reflex?" Tell yourself, "Today,  I choose not to smoke," or "I do not want to smoke." You are reminding yourself of your decision to quit, even if you do smoke a cigarette. HOW WILL I FEEL WHEN I QUIT SMOKING?  The benefits of not smoking start within days of quitting.  You may have symptoms of withdrawal because your body is used to nicotine (the addictive substance in cigarettes). You may crave cigarettes, be irritable, feel very hungry, cough often, get headaches, or have difficulty concentrating.  The withdrawal  symptoms are only temporary. They are strongest when you first quit but will go away within 10 to 14 days.  When withdrawal symptoms occur, stay in control. Think about your reasons for quitting. Remind yourself that these are signs that your body is healing and getting used to being without cigarettes.  Remember that withdrawal symptoms are easier to treat than the major diseases that smoking can cause.  Even after the withdrawal is over, expect periodic urges to smoke. However, these cravings are generally short-lived and will go away whether you smoke or not. Do not smoke!  If you relapse and smoke again, do not lose hope. Most smokers quit 3 times before they are successful.  If you relapse, do not give up! Plan ahead and think about what you will do the next time you get the urge to smoke. LIFE AS A NONSMOKER: MAKE IT FOR A MONTH, MAKE IT FOR LIFE Day 1: Hang this page where you will see it every day. Day 2: Get rid of all ashtrays, matches, and lighters. Day 3: Drink water. Breathe deeply between sips. Day 4: Avoid places with smoke-filled air, such as bars, clubs, or the smoking section of restaurants. Day 5: Keep track of how much money you save by not smoking. Day 6: Avoid boredom. Keep a good book with you or go to the movies. Day 7: Reward yourself! One week without smoking! Day 8: Make a dental appointment to get your teeth cleaned. Day 9: Decide how you will turn down a cigarette before it is offered to you. Day 10: Review your reasons for quitting. Day 11: Distract yourself. Stay active to keep your mind off smoking and to relieve tension. Take a walk, exercise, read a book, do a crossword puzzle, or try a new hobby. Day 12: Exercise. Get off the bus before your stop or use stairs instead of escalators. Day 13: Call on friends for support and encouragement. Day 14: Reward yourself! Two weeks without smoking! Day 15: Practice deep breathing exercises. Day 16: Bet a friend that  you can stay a nonsmoker. Day 17: Ask to sit in nonsmoking sections of restaurants. Day 18: Hang up "No Smoking" signs. Day 19: Think of yourself as a nonsmoker. Day 20: Each morning, tell yourself you will not smoke. Day 21: Reward yourself! Three weeks without smoking! Day 22: Think of smoking in negative ways. Remember how it stains your teeth, gives you bad breath, and leaves you short of breath. Day 23: Eat a nutritious breakfast. Day 24:Do not relive your days as a smoker. Day 25: Hold a pencil in your hand when talking on the telephone. Day 26: Tell all your friends you do not smoke. Day 27: Think about how much better food tastes. Day 28: Remember, one cigarette is one too many. Day 29: Take up a hobby that will keep your hands busy. Day 30: Congratulations! One month without smoking! Give yourself a big reward. Your caregiver can direct you to community resources  or hospitals for support, which may include:  Group support.  Education.  Hypnosis.  Subliminal therapy. Document Released: 11/22/2003 Document Revised: 05/18/2011 Document Reviewed: 12/10/2008 [facility] Patient Information 2014 [place], [place].

## 2012-11-03 NOTE — Assessment & Plan Note (Addendum)
He is not currently taking his statin. Last lipid panel was 11/2011, which looked good, but he was on a statin at that time. Checking lipid panel today.

## 2012-11-03 NOTE — Assessment & Plan Note (Addendum)
BP Readings from Last 3 Encounters:  11/03/12 143/93  10/25/12 167/113  10/23/12 146/102    Lab Results  Component Value Date   NA 139 10/23/2012   K 3.3* 10/23/2012   CREATININE 0.90 10/23/2012    Assessment: Blood pressure control: mildly elevated Progress toward BP goal:  improved Comments: Still with intermittent compliance. He has been off his medications for 2 days, but states that he was taking it daily after seeing Dr. Zada Girt on 8/18.  Plan: Medications:  continue current medications Educational resources provided: brochure;handout;video Self management tools provided:   Other plans: Encouraged that pt to take his BP medications regularly and to pick them up from the pharmacy when he runs out. I reordered the prescriptions today to ensure he has enough refills. F/u in 3 mo.

## 2012-11-08 NOTE — Progress Notes (Signed)
Case discussed with Dr. Glenn soon after the resident saw the patient.  We reviewed the resident's history and exam and pertinent patient test results.  I agree with the assessment, diagnosis, and plan of care documented in the resident's note. 

## 2012-12-21 ENCOUNTER — Encounter: Payer: Self-pay | Admitting: *Deleted

## 2013-01-19 ENCOUNTER — Encounter: Payer: Self-pay | Admitting: Internal Medicine

## 2013-01-19 ENCOUNTER — Ambulatory Visit (INDEPENDENT_AMBULATORY_CARE_PROVIDER_SITE_OTHER): Payer: Medicare Other | Admitting: Internal Medicine

## 2013-01-19 VITALS — BP 152/98 | HR 83 | Temp 96.7°F | Ht 73.25 in | Wt 164.8 lb

## 2013-01-19 DIAGNOSIS — N529 Male erectile dysfunction, unspecified: Secondary | ICD-10-CM

## 2013-01-19 DIAGNOSIS — Z8611 Personal history of tuberculosis: Secondary | ICD-10-CM

## 2013-01-19 DIAGNOSIS — I10 Essential (primary) hypertension: Secondary | ICD-10-CM

## 2013-01-19 DIAGNOSIS — F17200 Nicotine dependence, unspecified, uncomplicated: Secondary | ICD-10-CM | POA: Diagnosis not present

## 2013-01-19 MED ORDER — VARDENAFIL HCL 20 MG PO TABS: 20.0000 mg | ORAL_TABLET | ORAL | Status: DC | PRN

## 2013-01-19 MED ORDER — METOPROLOL TARTRATE 25 MG PO TABS: 25.0000 mg | ORAL_TABLET | Freq: Two times a day (BID) | ORAL | Status: DC

## 2013-01-19 NOTE — Assessment & Plan Note (Signed)
BP Readings from Last 3 Encounters:  01/19/13 177/122  11/03/12 143/93  10/25/12 167/113    Lab Results  Component Value Date   NA 139 10/23/2012   K 3.3* 10/23/2012   CREATININE 0.90 10/23/2012    Assessment: Blood pressure control: moderately elevated Progress toward BP goal:  deteriorated Comments: He had a history of labile blood pressures and medication noncompliance. While he states that he is taking his medications at higher doses than prescribed, he has had an issue with hypertension 2/2 to noncompliance and additional medications were added resulting in hypotension when he decided to take the medications. I am trying to avoid that this time. I am increasing his lopressor to 25mg  BID and continuing the lisinopril at 20mg  daily. He is to f/u in 2 weeks for a blood pressure check and I will adjust his medications as needed at that time.   Plan: Medications:  Lisinopril 20mg  daily and lopressor 25mg  BID Educational resources provided: brochure;handout;video Self management tools provided:   Other plans:  I am trying to avoid hypotensive episodes this time. I am increasing his lopressor to 25mg  BID and continuing the lisinopril at 20mg  daily. He is to f/u in 2 weeks for a blood pressure check and I will adjust his medications as needed at that time.

## 2013-01-19 NOTE — Progress Notes (Signed)
Patient ID: [name], male   DOB: 1950-07-09, 62 y.o.   MRN: [id number]  Subjective:   Patient ID: [name] male   DOB: [birth year] 62 y.o.   MRN: [id number]  HPI: [name] is a 62 y.o. M with PMH atrial tachycardia with a pacer, h/o CVA '10, ED, HLD, and uncontrolled HTN presents for a follow up appointment for his hypertension.   He states that his blood pressure has been elevated since his last visit. He is supposed to be taking 1/2 of a 25mg  tablet of lopressor BID but states that he is taking 1 1/2 tablets BID and taking 20mg  of Lisinopril BID instead of qday.  He denies headaches, dizziness, or chest pain.  He is still smoking 1/3ppd.   He is requesting Levitra instead of Viagra for his ED b/c he can get the Levitra cheaper.  Past Medical History  Diagnosis Date  . Tuberculosis at age 68-3  . Sickle cell trait   . Stroke 10/03/08    Right thalamus  . Hypertension     goal < 140/90  . Emphysema     per multiple CXRs  . Snoring     Sleep study 09/02/10 was WNL  . CEREBROVASCULAR ACCIDENT 10/17/2008    Acute CVA of right thalamus 10/03/08 ECHO performed 2/2 CVA 10/09/08, EF 55-65%  Aspirin 325 mg daily and try to work on quitting smoking.    Current Outpatient Prescriptions  Medication Sig Dispense Refill  . aspirin 325 MG tablet Take 325 mg by mouth every morning.      Marland Kitchen lisinopril (PRINIVIL,ZESTRIL) 20 MG tablet Take 1 tablet (20 mg total) by mouth every morning.  30 tablet  11  . metoprolol tartrate (LOPRESSOR) 25 MG tablet Take 1 tablet (25 mg total) by mouth 2 (two) times daily.  30 tablet  11  . vardenafil (LEVITRA) 20 MG tablet Take 1 tablet (20 mg total) by mouth as needed for erectile dysfunction.  10 tablet  3  . zoster vaccine live, PF, (ZOSTAVAX) 44010 UNT/0.65ML injection Inject 19,400 Units into the skin once.  1 each  0   No current facility-administered medications for this visit.   Family History  Problem Relation Age of Onset  . Cancer  Sister   . Sickle cell anemia Son    History   Social History  . Marital Status: Married    Spouse Name: N/A    Number of Children: N/A  . Years of Education: N/A   Occupational History  . unemployed    Social History Main Topics  . Smoking status: Current Every Day Smoker -- 0.30 packs/day for 10 years    Types: Cigarettes  . Smokeless tobacco: None     Comment: Cutting back.  . Alcohol Use: No  . Drug Use: No     Comment: previous polysubstance abuser, quit x 13 years  . Sexual Activity: None   Other Topics Concern  . None   Social History Narrative   Lives in [place].  Works as a Pharmacist, hospital for [employer]   Review of Systems: A 12 point ROS was performed; pertinent positives and negatives were noted in the HPI   Objective:  Physical Exam: Filed Vitals:   01/19/13 1326  BP: 152/98  Pulse: 83  Temp: 96.7 F (35.9 C)  TempSrc: Oral  Height: 6' 1.25" (1.861 m)  Weight: 164 lb 12.8 oz (74.753 kg)  SpO2: 100%   Constitutional: Vital signs reviewed.  Patient is a well-developed and well-nourished male in no acute distress and cooperative with exam.   Head: Normocephalic and atraumatic Eyes: PERRL, EOMI Cardiovascular: RRR, no MRG, pulses symmetric and intact bilaterally Pulmonary/Chest: Diminished breath sounds, normal respiratory effort, CTAB, no wheezes, rales, or rhonchi Abdominal: Soft. Non-tender, non-distended, bowel sounds are normal, no masses, organomegaly, or guarding present.  Musculoskeletal: No joint deformities, erythema, or stiffness, ROM full and no nontender Neurological: A&O x3, Strength is normal and symmetric bilaterally, cranial nerve II-XII are grossly intact, no focal motor deficit Psychiatric: Normal mood and affect.   Assessment & Plan:   Please refer to Problem List based Assessment and Plan

## 2013-01-19 NOTE — Patient Instructions (Signed)
Take the metoprolol 1 tablet 2 times a day. Take the lisinopril once a day only.  Come back to see me on December 1st.

## 2013-01-20 NOTE — Assessment & Plan Note (Signed)
Prescribing Levitra instead of the Viagra, because he can get the Levitra for cheaper

## 2013-01-20 NOTE — Assessment & Plan Note (Signed)
  Assessment: Progress toward smoking cessation:  smoking the same amount Barriers to progress toward smoking cessation:  lack of motivation to quit Comments: He has been counseled multiple times by multiple providers regarding cessation, but continues to smoke  Plan: Instruction/counseling given:  I counseled patient on the dangers of tobacco use, advised patient to stop smoking, and reviewed strategies to maximize success. Educational resources provided:  [employer]) brochure

## 2013-01-23 NOTE — Progress Notes (Signed)
Case discussed with Dr. Glenn at the time of the visit.  We reviewed the resident's history and exam and pertinent patient test results.  I agree with the assessment, diagnosis, and plan of care documented in the resident's note.   

## 2013-02-08 ENCOUNTER — Ambulatory Visit (INDEPENDENT_AMBULATORY_CARE_PROVIDER_SITE_OTHER): Payer: Medicare Other | Admitting: Internal Medicine

## 2013-02-08 ENCOUNTER — Encounter: Payer: Self-pay | Admitting: Internal Medicine

## 2013-02-08 VITALS — BP 154/107 | HR 72 | Temp 97.1°F | Ht 73.25 in | Wt 163.8 lb

## 2013-02-08 DIAGNOSIS — F17200 Nicotine dependence, unspecified, uncomplicated: Secondary | ICD-10-CM

## 2013-02-08 DIAGNOSIS — N289 Disorder of kidney and ureter, unspecified: Secondary | ICD-10-CM

## 2013-02-08 DIAGNOSIS — N529 Male erectile dysfunction, unspecified: Secondary | ICD-10-CM | POA: Diagnosis not present

## 2013-02-08 DIAGNOSIS — N281 Cyst of kidney, acquired: Secondary | ICD-10-CM | POA: Insufficient documentation

## 2013-02-08 DIAGNOSIS — Z8611 Personal history of tuberculosis: Secondary | ICD-10-CM | POA: Diagnosis not present

## 2013-02-08 DIAGNOSIS — M549 Dorsalgia, unspecified: Secondary | ICD-10-CM

## 2013-02-08 DIAGNOSIS — I10 Essential (primary) hypertension: Secondary | ICD-10-CM

## 2013-02-08 DIAGNOSIS — Z0000 Encounter for general adult medical examination without abnormal findings: Secondary | ICD-10-CM

## 2013-02-08 DIAGNOSIS — E785 Hyperlipidemia, unspecified: Secondary | ICD-10-CM | POA: Diagnosis not present

## 2013-02-08 DIAGNOSIS — Z299 Encounter for prophylactic measures, unspecified: Secondary | ICD-10-CM | POA: Diagnosis not present

## 2013-02-08 DIAGNOSIS — N2889 Other specified disorders of kidney and ureter: Secondary | ICD-10-CM

## 2013-02-08 MED ORDER — LISINOPRIL 40 MG PO TABS: 40.0000 mg | ORAL_TABLET | Freq: Every morning | ORAL | Status: DC

## 2013-02-08 MED ORDER — MELOXICAM 15 MG PO TABS
ORAL_TABLET | ORAL | Status: DC
Start: 2013-02-08 — End: 2013-05-08

## 2013-02-08 NOTE — Progress Notes (Signed)
Patient ID: [name], male   DOB: [birth year], 62 y.o.   MRN: [id number]  Subjective:   Patient ID: [name] male   DOB: [birth year] 62 y.o.   MRN: [id number]  HPI: [name] is a 62 y.o. M with PMH atrial tachycardia with a pacer, h/o CVA '10, ED, HLD, and uncontrolled HTN presents for a follow up appointment for his hypertension and c/o back pain.  He is c/o of back pain that has worsened over the past few days, especially overnight. He c/o pain at his lumbosacral spine that is worse after sitting on a soft surface. He states that he was seen by a urologist at [facility] in 2011 for hematuria, per pt and medical record review. I do not have access to these notes, but [name] states that he told he has cancer that has spread from one side to the other which was likely the cause of his pain- ???. He states that he was supposed to f/u at [facility] but has not, as they wanted to get an MRI but were unable 2/2 to his pacemaker.   Upon chart review from Care Everywhere, looking at the notes from [facility], he was seen 03/2009 by Urology. A cytoscopy was performed on 06/19/09 showed normal bladder, normal urethra, and normal ureters. CT abdomen and pelvis was performed and showed his left kidney with an 11x27mm septated lesion in the lower pole of the kidney, and  in the right kidney, a 2.4x 1.7cm lesion in the upper pole.   He states that his weight has been stable. He denies any changes to bladder or bowel movements. He denies any numbness or tingling in his BLE.  Past Medical History  Diagnosis Date  . Tuberculosis at age 8-3  . Sickle cell trait   . Stroke 10/03/08    Right thalamus  . Hypertension     goal < 140/90  . Emphysema     per multiple CXRs  . Snoring     Sleep study 09/02/10 was WNL  . CEREBROVASCULAR ACCIDENT 10/17/2008    Acute CVA of right thalamus 10/03/08 ECHO performed 2/2 CVA 10/09/08, EF 55-65%  Aspirin 325 mg daily and try to work on quitting smoking.     Current Outpatient Prescriptions  Medication Sig Dispense Refill  . aspirin 325 MG tablet Take 325 mg by mouth every morning.      Marland Kitchen lisinopril (PRINIVIL,ZESTRIL) 20 MG tablet Take 1 tablet (20 mg total) by mouth every morning.  30 tablet  11  . metoprolol tartrate (LOPRESSOR) 25 MG tablet Take 1 tablet (25 mg total) by mouth 2 (two) times daily.  30 tablet  11  . vardenafil (LEVITRA) 20 MG tablet Take 1 tablet (20 mg total) by mouth as needed for erectile dysfunction.  10 tablet  3  . zoster vaccine live, PF, (ZOSTAVAX) 78295 UNT/0.65ML injection Inject 19,400 Units into the skin once.  1 each  0   No current facility-administered medications for this visit.   Family History  Problem Relation Age of Onset  . Cancer Sister   . Sickle cell anemia Son    History   Social History  . Marital Status: Married    Spouse Name: N/A    Number of Children: N/A  . Years of Education: N/A   Occupational History  . unemployed    Social History Main Topics  . Smoking status: Current Every Day Smoker -- 0.30 packs/day for 10 years    Types:  Cigarettes  . Smokeless tobacco: None     Comment: Cutting back.  . Alcohol Use: No  . Drug Use: No     Comment: previous polysubstance abuser, quit x 13 years  . Sexual Activity: None   Other Topics Concern  . None   Social History Narrative   Lives in [place].  Works as a Pharmacist, hospital for [employer]   Review of Systems: Constitutional: Denies fever, chills, appetite change and fatigue.  Respiratory: Denies +SOB and wheezing.   Cardiovascular: Denies chest pain or leg swelling.  Gastrointestinal: Denies nausea, vomiting, abdominal pain Genitourinary: Denies dysuria, urgency, frequency, hematuria Musculoskeletal: +lower back pain that awakens him at night Skin: Denies rash and wound.  Neurological: Denies dizziness or weakness  Psychiatric/Behavioral: Denies suicidal ideation, mood changes  Objective:  Physical  Exam: Filed Vitals:   02/08/13 1448  BP: 146/104  Pulse: 86  Temp: 97.1 F (36.2 C)  TempSrc: Oral  Height: 6' 1.25" (1.861 m)  Weight: 163 lb 12.8 oz (74.299 kg)  SpO2: 98%   Constitutional: Vital signs reviewed.  Patient is a well-developed and well-nourished male in no acute distress and cooperative with exam.  Head: Normocephalic and atraumatic Eyes: PERRL, EOMI Neck: Supple, Trachea midline normal ROM, No JVD, mass, thyromegaly Cardiovascular: RRR, no MRG, pulses symmetric and intact bilaterally Pulmonary/Chest: normal respiratory effort, CTAB, no wheezes, rales, or rhonchi. Pacemaker under skin at right chest. Abdominal: Soft. Non-tender, non-distended, bowel sounds are normal, no masses, organomegaly, or guarding present.  GU: no CVA tenderness Musculoskeletal: No joint deformities or erythema. +TTP of lumbosacral spine, but with full range of motion.  Hematology: No cervical adenopathy.  Neurological: A&O x3, Strength is normal and symmetric bilaterally, cranial nerve II-XII are grossly intact, no focal motor deficit, sensory intact to light touch bilaterally.  Psychiatric: Normal mood and affect. speech and behavior is normal.   Assessment & Plan:   Please refer to Problem List based Assessment and Plan

## 2013-02-08 NOTE — Patient Instructions (Addendum)
Your blood pressure is up today. I'm increasing your lisinopril from 20 to 40mg  daily. This prescription has been sent to the pharmacy.  With yout history of kidney masses per medical records from [facility], I want to check a CT scan of your abdomen and pelvis to reevaluate these masses. Please have this done as soon as possible. This is very important!

## 2013-02-09 LAB — BASIC METABOLIC PANEL WITH GFR
BUN: 15 mg/dL (ref 6–23)
CO2: 27 mEq/L (ref 19–32)
Calcium: 8.8 mg/dL (ref 8.4–10.5)
Creat: 0.84 mg/dL (ref 0.50–1.35)
Glucose, Bld: 86 mg/dL (ref 70–99)

## 2013-02-10 NOTE — Assessment & Plan Note (Signed)
  Assessment: Progress toward smoking cessation:  smoking the same amount Barriers to progress toward smoking cessation:   Pt does not want to quit    Plan: Instruction/counseling given:  I counseled patient on the dangers of tobacco use, advised patient to stop smoking, and reviewed strategies to maximize success. Educational resources provided:  [employer]) brochure

## 2013-02-10 NOTE — Assessment & Plan Note (Signed)
BP Readings from Last 3 Encounters:  02/08/13 154/107  01/19/13 152/98  11/03/12 143/93    Lab Results  Component Value Date   NA 137 02/08/2013   K 4.3 02/08/2013   CREATININE 0.84 02/08/2013    Assessment: Blood pressure control: mildly elevated Progress toward BP goal:  unchanged Comments: Additional metoprolol 25 twice a day and lisinopril 20 mg daily  Plan: Medications:  Increasing lisinopril to 40 mg daily. Patient is to continue the metoprolol.  Educational resources provided: Comptroller tools provided:   Other plans: Checked a BMP: Results of the BMP with normal potassium and renal function normal as well.

## 2013-02-10 NOTE — Assessment & Plan Note (Addendum)
[name] has still not received his Zostavax.

## 2013-02-10 NOTE — Assessment & Plan Note (Addendum)
Upon chart review through care everywhere, the patient was seen at [facility] in 2011 by Urology. CT imaging was performed which did show bilateral renal lesions., with the "left kidney 11 mm slightly irregular lesion with septations and probable enhancement. This is worrisome for a renal cell tumor." A renal u/s was subsequently performed and showed "A lobulated cystic lesion with thin septation measuring 3.2 cm in maximum dimension is identified in the upper pole of the right kidney which corresponds to the same findings observed on the CT scan. There is no evidence of a mural nodularities within this cystic lesion. No other focal mass in the right kidney seen. No hydronephrosis. The small 1 cm low attenuation lesion seen on the CT scan in the left kidney is not seen by ultrasound on today's exam . No renal stones are identified. There is no abnormal dilatation of the collecting system. There is no perinephric abnormality."   [name], he was asked to return for an MRI. [name] said he did not return back to [facility] because he was told he could not get the MRI done due him having a pacemaker. He states that these renal lesions have not been followed up on since.  He denies any hematuria, and has no CVA tenderness on exam. His renal function is normal. Checking a CT abdomen and pelvis for further evaluation, and based on the results, I will make the next step in evaluation and treatment. He's to followup with me in 2 weeks.

## 2013-02-13 NOTE — Addendum Note (Signed)
Addended by: Charlsie Merles F on: 02/13/2013 11:12 AM   Modules accepted: Orders

## 2013-02-14 ENCOUNTER — Ambulatory Visit (HOSPITAL_COMMUNITY)
Admission: RE | Admit: 2013-02-14 | Discharge: 2013-02-14 | Disposition: A | Discharge status: Home / Self Care | Payer: Medicare Other | Source: Ambulatory Visit | Attending: Internal Medicine | Admitting: Internal Medicine

## 2013-02-14 ENCOUNTER — Other Ambulatory Visit: Payer: Self-pay | Admitting: Internal Medicine

## 2013-02-14 DIAGNOSIS — M899 Disorder of bone, unspecified: Secondary | ICD-10-CM | POA: Insufficient documentation

## 2013-02-14 DIAGNOSIS — I700 Atherosclerosis of aorta: Secondary | ICD-10-CM | POA: Insufficient documentation

## 2013-02-14 DIAGNOSIS — N2889 Other specified disorders of kidney and ureter: Secondary | ICD-10-CM

## 2013-02-14 DIAGNOSIS — Q619 Cystic kidney disease, unspecified: Secondary | ICD-10-CM | POA: Insufficient documentation

## 2013-02-14 DIAGNOSIS — N281 Cyst of kidney, acquired: Secondary | ICD-10-CM | POA: Diagnosis not present

## 2013-02-14 MED ORDER — IOHEXOL 300 MG/ML  SOLN
80.0000 mL | Freq: Once | INTRAMUSCULAR | Status: AC | PRN
  Administered 2013-02-14: 80 mL via INTRAVENOUS

## 2013-02-22 ENCOUNTER — Ambulatory Visit (INDEPENDENT_AMBULATORY_CARE_PROVIDER_SITE_OTHER): Payer: Medicare Other | Admitting: Internal Medicine

## 2013-02-22 ENCOUNTER — Encounter: Payer: Self-pay | Admitting: Internal Medicine

## 2013-02-22 VITALS — BP 142/101 | HR 67 | Temp 97.3°F | Ht 73.25 in | Wt 163.9 lb

## 2013-02-22 DIAGNOSIS — E785 Hyperlipidemia, unspecified: Secondary | ICD-10-CM | POA: Diagnosis not present

## 2013-02-22 DIAGNOSIS — J438 Other emphysema: Secondary | ICD-10-CM | POA: Diagnosis not present

## 2013-02-22 DIAGNOSIS — R7309 Other abnormal glucose: Secondary | ICD-10-CM | POA: Diagnosis not present

## 2013-02-22 DIAGNOSIS — Z950 Presence of cardiac pacemaker: Secondary | ICD-10-CM | POA: Diagnosis not present

## 2013-02-22 DIAGNOSIS — H6980 Other specified disorders of Eustachian tube, unspecified ear: Secondary | ICD-10-CM | POA: Diagnosis not present

## 2013-02-22 DIAGNOSIS — Z720 Tobacco use: Secondary | ICD-10-CM

## 2013-02-22 DIAGNOSIS — F17200 Nicotine dependence, unspecified, uncomplicated: Secondary | ICD-10-CM | POA: Diagnosis not present

## 2013-02-22 DIAGNOSIS — Z0000 Encounter for general adult medical examination without abnormal findings: Secondary | ICD-10-CM | POA: Diagnosis not present

## 2013-02-22 DIAGNOSIS — I10 Essential (primary) hypertension: Secondary | ICD-10-CM

## 2013-02-22 DIAGNOSIS — E876 Hypokalemia: Secondary | ICD-10-CM | POA: Diagnosis not present

## 2013-02-22 MED ORDER — AMLODIPINE BESYLATE 5 MG PO TABS: 5.0000 mg | ORAL_TABLET | Freq: Every day | ORAL | Status: DC

## 2013-02-22 NOTE — Assessment & Plan Note (Signed)
  Assessment: Progress toward smoking cessation:  smoking less Comments: Pt down to 2 cig/day. He states that he is working on quitting by New Years.  Plan: Instruction/counseling given:  I counseled patient on the dangers of tobacco use, advised patient to stop smoking, and reviewed strategies to maximize success. Educational resources provided:  [employer]) brochure

## 2013-02-22 NOTE — Assessment & Plan Note (Addendum)
BP Readings from Last 3 Encounters:  02/22/13 142/101  02/08/13 154/107  01/19/13 152/98    Lab Results  Component Value Date   NA 137 02/08/2013   K 4.3 02/08/2013   CREATININE 0.84 02/08/2013    Assessment: Blood pressure control: mildly elevated Progress toward BP goal:  improved Comments: BP improved after increasing his Lisinopril.    Plan: Medications:  continue current medications, and begining Amlodipine 5mg  po daily. Educational resources provided: Comptroller tools provided:   Other plans: Checking BMP to evaluate his potassium and renal function after increasing the lisinopril at his last clinic visit.                       F/u in 3 months

## 2013-02-22 NOTE — Progress Notes (Signed)
Patient ID: [name], male   DOB: [birth year], 62 y.o.   MRN: [id number]  Subjective:   Patient ID: [name] male   DOB: [birth year] 62 y.o.   MRN: [id number]  HPI: [name] is a 62 y.o. M with PMH atrial tachycardia with a pacer, h/o CVA '10, ED, HLD, and uncontrolled HTN presents for a follow up appointment for his hypertension.  He was seen on 12/3, where his lisinopril was increased to 40 mg daily. He's also taking metoprolol 25 mg twice a day. BMP was checked at his last visit and was within normal limits. He denies any headaches, changes in vision, or dizziness.  As last visit he also endorsed having a history of bilateral renal masses that were thought to possibly be cancerous for which he was seen at [facility] in 2011 by urology. A repeat CT abdomen and pelvis was performed on 02/14/13 to evaluate these "masses" which showed a 2.7 cm posterior right upper pole renal cyst, mildly complex, benign and of minimal cortical scarring involving the left lower kidney. No renal calculi or hydronephrosis. He continues to deny any pain, changes in urination, or hematuria.   Past Medical History  Diagnosis Date  . Tuberculosis at age 57-3  . Sickle cell trait   . Stroke 10/03/08    Right thalamus  . Hypertension     goal < 140/90  . Emphysema     per multiple CXRs  . Snoring     Sleep study 09/02/10 was WNL  . CEREBROVASCULAR ACCIDENT 10/17/2008    Acute CVA of right thalamus 10/03/08 ECHO performed 2/2 CVA 10/09/08, EF 55-65%  Aspirin 325 mg daily and try to work on quitting smoking.    Current Outpatient Prescriptions  Medication Sig Dispense Refill  . aspirin 325 MG tablet Take 325 mg by mouth every morning.      Marland Kitchen lisinopril (PRINIVIL,ZESTRIL) 40 MG tablet Take 1 tablet (40 mg total) by mouth every morning.  30 tablet  6  . meloxicam (MOBIC) 15 MG tablet TAKE ONE TABLET BY MOUTH EVERY DAY WITH FOOD FOR BACK PAIN  30 tablet  1  . metoprolol tartrate (LOPRESSOR)  25 MG tablet Take 1 tablet (25 mg total) by mouth 2 (two) times daily.  30 tablet  11  . amLODipine (NORVASC) 5 MG tablet Take 1 tablet (5 mg total) by mouth daily.  30 tablet  11  . vardenafil (LEVITRA) 20 MG tablet Take 1 tablet (20 mg total) by mouth as needed for erectile dysfunction.  10 tablet  3  . zoster vaccine live, PF, (ZOSTAVAX) 08657 UNT/0.65ML injection Inject 19,400 Units into the skin once.  1 each  0   No current facility-administered medications for this visit.   Family History  Problem Relation Age of Onset  . Cancer Sister   . Sickle cell anemia Son    History   Social History  . Marital Status: Married    Spouse Name: N/A    Number of Children: N/A  . Years of Education: N/A   Occupational History  . unemployed    Social History Main Topics  . Smoking status: Current Every Day Smoker -- 0.20 packs/day for 10 years    Types: Cigarettes  . Smokeless tobacco: None     Comment: Cutting back.  . Alcohol Use: No  . Drug Use: No     Comment: previous polysubstance abuser, quit x 13 years  . Sexual Activity: None  Other Topics Concern  . None   Social History Narrative   Lives in [place].  Works as a Pharmacist, hospital for [employer]   Review of Systems: A 12 point ROS was performed; pertinent positives and negatives were noted in the HPI   Objective:  Physical Exam: Filed Vitals:   02/22/13 1445 02/22/13 1457  BP: 154/99 142/101  Pulse: 88 67  Temp: 97.3 F (36.3 C)   TempSrc: Oral   Height: 6' 1.25" (1.861 m)   Weight: 163 lb 14.4 oz (74.345 kg)   SpO2: 100%    Constitutional: Vital signs reviewed.  Patient is a well-developed and well-nourished male in no acute distress and cooperative with exam.   Head: Normocephalic and atraumatic Eyes: PERRL, EOMI Neck: Supple, Trachea midline, normal ROM, No JVD Cardiovascular: RRR, no MRG, pulses symmetric and intact bilaterally Pulmonary/Chest: Normal respiratory effort, CTAB, no  wheezes, rales, or rhonchi Abdominal: Soft. Non-tender, non-distended, bowel sounds are normal, no masses, organomegaly, or guarding present.  GU: no CVA tenderness Musculoskeletal: No joint deformities, erythema, or stiffness, Neurological: A&O x3, Strength is normal and symmetric bilaterally, cranial nerve II-XII are grossly intact, non focal Skin: Warm, dry and intact.  Psychiatric: Normal mood and affect. speech and behavior is normal.   Assessment & Plan:   Please refer to Problem List based Assessment and Plan

## 2013-02-22 NOTE — Patient Instructions (Signed)
Begin taking the Amlodipine 5mg  daily. Continue to take the metoprolol 25mg  2 times a day, and continue the Lisinopril 40mg  daily.  Keep up the good work with cutting back the cigarettes!!!  I'll see you back in 3 months.

## 2013-02-23 NOTE — Progress Notes (Signed)
Case discussed with Dr. Sherrine Maples at the time of the visit.  We reviewed the resident's history and exam and pertinent patient test results.  I agree with the assessment, diagnosis, and plan of care documented in the resident's note.  I advised Dr Sherrine Maples to follow up on this patient sooner than 3 month.

## 2013-02-25 NOTE — Progress Notes (Signed)
Case discussed with Dr. Glenn soon after the resident saw the patient.  We reviewed the resident's history and exam and pertinent patient test results.  I agree with the assessment, diagnosis, and plan of care documented in the resident's note. 

## 2013-03-23 ENCOUNTER — Encounter: Payer: Medicare Other | Admitting: Internal Medicine

## 2013-03-23 ENCOUNTER — Encounter: Payer: Self-pay | Admitting: Internal Medicine

## 2013-03-29 ENCOUNTER — Emergency Department (HOSPITAL_COMMUNITY)
Admission: EM | Admit: 2013-03-29 | Discharge: 2013-03-29 | Disposition: A | Discharge status: Home / Self Care | Payer: Medicare Other | Attending: Emergency Medicine | Admitting: Emergency Medicine

## 2013-03-29 ENCOUNTER — Emergency Department (HOSPITAL_COMMUNITY): Payer: Medicare Other

## 2013-03-29 ENCOUNTER — Encounter (HOSPITAL_COMMUNITY): Payer: Self-pay | Admitting: Emergency Medicine

## 2013-03-29 DIAGNOSIS — R079 Chest pain, unspecified: Secondary | ICD-10-CM

## 2013-03-29 DIAGNOSIS — Z8611 Personal history of tuberculosis: Secondary | ICD-10-CM | POA: Insufficient documentation

## 2013-03-29 DIAGNOSIS — R51 Headache: Secondary | ICD-10-CM | POA: Insufficient documentation

## 2013-03-29 DIAGNOSIS — Z791 Long term (current) use of non-steroidal anti-inflammatories (NSAID): Secondary | ICD-10-CM | POA: Insufficient documentation

## 2013-03-29 DIAGNOSIS — F17200 Nicotine dependence, unspecified, uncomplicated: Secondary | ICD-10-CM | POA: Diagnosis not present

## 2013-03-29 DIAGNOSIS — Z8673 Personal history of transient ischemic attack (TIA), and cerebral infarction without residual deficits: Secondary | ICD-10-CM | POA: Diagnosis not present

## 2013-03-29 DIAGNOSIS — J438 Other emphysema: Secondary | ICD-10-CM | POA: Insufficient documentation

## 2013-03-29 DIAGNOSIS — Z79899 Other long term (current) drug therapy: Secondary | ICD-10-CM | POA: Insufficient documentation

## 2013-03-29 DIAGNOSIS — Z950 Presence of cardiac pacemaker: Secondary | ICD-10-CM | POA: Insufficient documentation

## 2013-03-29 DIAGNOSIS — I10 Essential (primary) hypertension: Secondary | ICD-10-CM | POA: Diagnosis not present

## 2013-03-29 DIAGNOSIS — Z862 Personal history of diseases of the blood and blood-forming organs and certain disorders involving the immune mechanism: Secondary | ICD-10-CM | POA: Insufficient documentation

## 2013-03-29 DIAGNOSIS — R0789 Other chest pain: Secondary | ICD-10-CM | POA: Insufficient documentation

## 2013-03-29 DIAGNOSIS — Z7982 Long term (current) use of aspirin: Secondary | ICD-10-CM | POA: Diagnosis not present

## 2013-03-29 LAB — CBC
HEMATOCRIT: 36.1 % — AB (ref 39.0–52.0)
HEMOGLOBIN: 12.5 g/dL — AB (ref 13.0–17.0)
MCH: 28.6 pg (ref 26.0–34.0)
MCHC: 34.6 g/dL (ref 30.0–36.0)
MCV: 82.6 fL (ref 78.0–100.0)
Platelets: 181 10*3/uL (ref 150–400)
RBC: 4.37 MIL/uL (ref 4.22–5.81)
RDW: 14.9 % (ref 11.5–15.5)
WBC: 4.6 10*3/uL (ref 4.0–10.5)

## 2013-03-29 LAB — POCT I-STAT TROPONIN I: Troponin i, poc: 0 ng/mL (ref 0.00–0.08)

## 2013-03-29 LAB — BASIC METABOLIC PANEL
BUN: 11 mg/dL (ref 6–23)
CHLORIDE: 104 meq/L (ref 96–112)
CO2: 28 meq/L (ref 19–32)
Calcium: 9.1 mg/dL (ref 8.4–10.5)
Creatinine, Ser: 0.85 mg/dL (ref 0.50–1.35)
GFR calc Af Amer: >90 mL/min (ref >90)
GLUCOSE: 95 mg/dL (ref 70–99)
POTASSIUM: 4.1 meq/L (ref 3.7–5.3)
SODIUM: 143 meq/L (ref 137–147)

## 2013-03-29 MED ORDER — HYDROCODONE-ACETAMINOPHEN 7.5-325 MG/15ML PO SOLN: 15.0000 mL | Freq: Four times a day (QID) | ORAL | Status: DC | PRN

## 2013-03-29 NOTE — ED Notes (Signed)
Per pt, pain at pacemaker site radiating to back-also c/o headache

## 2013-03-29 NOTE — ED Provider Notes (Signed)
  Face-to-face evaluation   History: Intermittent chest pain, for several days. No known complications. No similar in the past. He describes the pain as sharp and brief.  Physical exam: Alert, in no apparent distress. He has mild, left anterior upper chest wall tenderness to light palpation.  Medical screening examination/treatment/procedure(s) were conducted as a shared visit with non-physician practitioner(s) and myself.  I personally evaluated the patient during the encounter  Richarda Blade, MD 03/29/13 [phone]

## 2013-03-29 NOTE — ED Provider Notes (Signed)
CSN: [id number]     Arrival date & time 03/29/13  1021 History   First MD Initiated Contact with Patient 03/29/13 1045     Chief Complaint  Patient presents with  . Chest Pain   (Consider location/radiation/quality/duration/timing/severity/associated sxs/prior Treatment) HPI 63 year old male with past medical history significant for atrial tachycardia with pacemaker, history of CVA, HDL, hypertension presents for evaluations of chest pain. Patient reports for the past 2 or 3 days he has been experiencing intermittent sharp left upper chest pain near his pacemaker. Pain has some pleuritic component. Lasting for seconds to minutes and resolved without any specific treatment. Pain does radiates to his left shoulder blade. He also endorsed mild frontal headache. He has no active chest pain at this time. This morning he woke up with sensation of throat dryness, did cough up white sputum with trace of red streaks of blood which concerns him. He denies having any fever, chills, lightheadedness, [facility], heart palpitation, shortness of breath, abdominal pain, vomiting, diarrhea, or rash. He reports mild nausea yesterday it is transient in nature. Denies prior history of heart attack. States he has a gas heat at home which  He thinks caused him to have headaches on a regular basis. Headache is been ongoing for months.    Past Medical History  Diagnosis Date  . Tuberculosis at age 54-3  . Sickle cell trait   . Stroke 10/03/08    Right thalamus  . Hypertension     goal < 140/90  . Emphysema     per multiple CXRs  . Snoring     Sleep study 09/02/10 was WNL  . CEREBROVASCULAR ACCIDENT 10/17/2008    Acute CVA of right thalamus 10/03/08 ECHO performed 2/2 CVA 10/09/08, EF 55-65%  Aspirin 325 mg daily and try to work on quitting smoking.    Past Surgical History  Procedure Laterality Date  . Appendectomy  1977  . Vasectomy  1990  . Pacemaker insertion  07/24/08    MDT Adapta L implanted by Dr Blanch Media    Family History  Problem Relation Age of Onset  . Cancer Sister   . Sickle cell anemia Son    History  Substance Use Topics  . Smoking status: Current Every Day Smoker -- 0.20 packs/day for 10 years    Types: Cigarettes  . Smokeless tobacco: Not on file     Comment: Cutting back.  . Alcohol Use: No    Review of Systems  All other systems reviewed and are negative.    Allergies  Review of patient's allergies indicates no known allergies.  Home Medications   Current Outpatient Rx  Name  Route  Sig  Dispense  Refill  . amLODipine (NORVASC) 5 MG tablet   Oral   Take 1 tablet (5 mg total) by mouth daily.   30 tablet   11   . aspirin 325 MG tablet   Oral   Take 325 mg by mouth every morning.         Marland Kitchen lisinopril (PRINIVIL,ZESTRIL) 40 MG tablet   Oral   Take 1 tablet (40 mg total) by mouth every morning.   30 tablet   6   . meloxicam (MOBIC) 15 MG tablet      TAKE ONE TABLET BY MOUTH EVERY DAY WITH FOOD FOR BACK PAIN   30 tablet   1   . metoprolol tartrate (LOPRESSOR) 25 MG tablet   Oral   Take 1 tablet (25 mg total) by mouth 2 (two) times  daily.   30 tablet   11   . vardenafil (LEVITRA) 20 MG tablet   Oral   Take 1 tablet (20 mg total) by mouth as needed for erectile dysfunction.   10 tablet   3   . zoster vaccine live, PF, (ZOSTAVAX) 70962 UNT/0.65ML injection   Subcutaneous   Inject 19,400 Units into the skin once.   1 each   0    BP 140/100  Pulse 75  Temp(Src) 97.8 F (36.6 C) (Oral)  Resp 11  SpO2 99% Physical Exam  Constitutional: He appears well-developed and well-nourished. No distress.  HENT:  Head: Atraumatic.  Eyes: Conjunctivae are normal.  Neck: Normal range of motion. Neck supple. No JVD present.  Cardiovascular: Normal rate, regular rhythm and intact distal pulses.   Pulmonary/Chest: Effort normal and breath sounds normal. No respiratory distress. He has no wheezes. He has no rales. He exhibits no tenderness.  Pace maker  to L upper chest, nontender to palpation.  Musculoskeletal: He exhibits no edema.  Neurological: He is alert.  Skin: No rash noted.  Psychiatric: He has a normal mood and affect.    ED Course  Procedures (including critical care time)   Date: 03/29/2013  Rate: 76  Rhythm: normal sinus rhythm  QRS Axis: normal  Intervals: normal  ST/T Wave abnormalities: normal  Conduction Disutrbances: none  Narrative Interpretation: Borderline Right axis deviation, borderline T waves abnormalities  Old EKG Reviewed: No significant changes noted  11:05 AM Pt here with intermittent pleuritic cp near pace maker site.  Pain is atypical for ACS.  No active CP at this time.  Since pt endorse cough, hemoptysis, and pleuritic cp will obtain cxr.  No significant risk factors for PE.  Cardiac work up initiated.    11:20 AM Pt has a Paramedic that has been interrogated and appears to function appropriately.    1:25 PM Patient without active chest pain. EKG and troponin shows no evidence of acute ischemic changes. Chest x-ray without acute infiltrate or signs of pneumonia. CBC and BNP are reassuring. Dr. Eulis Foster seen and evaluate the patient felt he is stable for discharge. Patient will followup with his PCP for further evaluation of his chest pain. Return precautions discussed. Patient voiced understanding and agrees with plan. Hycet was prescribe for cough and chest discomfort   Labs Review Labs Reviewed  CBC - Abnormal; Notable for the following:    Hemoglobin 12.5 (*)    HCT 36.1 (*)    All other components within normal limits  BASIC METABOLIC PANEL  POCT I-STAT TROPONIN I   Imaging Review Dg Chest 2 View  03/29/2013   CLINICAL DATA:  Nausea, chest pain  EXAM: CHEST  2 VIEW  COMPARISON:  10/23/2012  FINDINGS: Cardiomediastinal silhouette is stable. Dual lead cardiac pacemaker is unchanged in position. No acute infiltrate or pleural effusion. No pulmonary edema. Stable chronic interstitial  prominence and hyperinflation. Mild degenerative changes mid thoracic spine. Bullous changes upper lobes again noted.  IMPRESSION: No active disease. Stable hyperinflation and chronic mild interstitial prominence.   Electronically Signed   By: Lahoma Crocker M.D.   On: 03/29/2013 11:52    EKG Interpretation   None       MDM   1. Chest pain    BP 154/107  Pulse 75  Temp(Src) 97.8 F (36.6 C) (Oral)  Resp 16  SpO2 100%  I have reviewed nursing notes and vital signs. I personally reviewed the imaging tests through PACS  system  I reviewed available ER/hospitalization records thought the EMR     Domenic Moras, [place] 03/29/13 1438

## 2013-03-29 NOTE — Discharge Instructions (Signed)
Please follow up closely with your doctor for further evaluation of your chest pain  Chest Pain (Nonspecific) It is often hard to give a specific diagnosis for the cause of chest pain. There is always a chance that your pain could be related to something serious, such as a heart attack or a blood clot in the lungs. You need to follow up with your caregiver for further evaluation. CAUSES   Heartburn.  Pneumonia or bronchitis.  Anxiety or stress.  Inflammation around your heart (pericarditis) or lung (pleuritis or pleurisy).  A blood clot in the lung.  A collapsed lung (pneumothorax). It can develop suddenly on its own (spontaneous pneumothorax) or from injury (trauma) to the chest.  Shingles infection (herpes zoster virus). The chest wall is composed of bones, muscles, and cartilage. Any of these can be the source of the pain.  The bones can be bruised by injury.  The muscles or cartilage can be strained by coughing or overwork.  The cartilage can be affected by inflammation and become sore (costochondritis). DIAGNOSIS  Lab tests or other studies, such as X-rays, electrocardiography, stress testing, or cardiac imaging, may be needed to find the cause of your pain.  TREATMENT   Treatment depends on what may be causing your chest pain. Treatment may include:  Acid blockers for heartburn.  Anti-inflammatory medicine.  Pain medicine for inflammatory conditions.  Antibiotics if an infection is present.  You may be advised to change lifestyle habits. This includes stopping smoking and avoiding alcohol, caffeine, and chocolate.  You may be advised to keep your head raised (elevated) when sleeping. This reduces the chance of acid going backward from your stomach into your esophagus.  Most of the time, nonspecific chest pain will improve within 2 to 3 days with rest and mild pain medicine. HOME CARE INSTRUCTIONS   If antibiotics were prescribed, take your antibiotics as directed.  Finish them even if you start to feel better.  For the next few days, avoid physical activities that bring on chest pain. Continue physical activities as directed.  Do not smoke.  Avoid drinking alcohol.  Only take over-the-counter or prescription medicine for pain, discomfort, or fever as directed by your caregiver.  Follow your caregiver's suggestions for further testing if your chest pain does not go away.  Keep any follow-up appointments you made. If you do not go to an appointment, you could develop lasting (chronic) problems with pain. If there is any problem keeping an appointment, you must call to reschedule. SEEK MEDICAL CARE IF:   You think you are having problems from the medicine you are taking. Read your medicine instructions carefully.  Your chest pain does not go away, even after treatment.  You develop a rash with blisters on your chest. SEEK IMMEDIATE MEDICAL CARE IF:   You have increased chest pain or pain that spreads to your arm, neck, jaw, back, or abdomen.  You develop shortness of breath, an increasing cough, or you are coughing up blood.  You have severe back or abdominal pain, feel nauseous, or vomit.  You develop severe weakness, fainting, or chills.  You have a fever. THIS IS AN EMERGENCY. Do not wait to see if the pain will go away. Get medical help at once. Call your local emergency services (911 in [place] Do not drive yourself to the hospital. MAKE SURE YOU:   Understand these instructions.  Will watch your condition.  Will get help right away if you are not doing well or get  worse. Document Released: 12/03/2004 Document Revised: 05/18/2011 Document Reviewed: 09/29/2007 [facility] Patient Information 2014 [place].

## 2013-04-04 ENCOUNTER — Ambulatory Visit (INDEPENDENT_AMBULATORY_CARE_PROVIDER_SITE_OTHER): Payer: Medicare Other | Admitting: Internal Medicine

## 2013-04-04 ENCOUNTER — Encounter: Payer: Self-pay | Admitting: Internal Medicine

## 2013-04-04 VITALS — BP 135/96 | HR 84 | Temp 97.2°F | Ht 73.25 in | Wt 165.3 lb

## 2013-04-04 DIAGNOSIS — F17200 Nicotine dependence, unspecified, uncomplicated: Secondary | ICD-10-CM | POA: Diagnosis not present

## 2013-04-04 DIAGNOSIS — I10 Essential (primary) hypertension: Secondary | ICD-10-CM | POA: Diagnosis not present

## 2013-04-04 DIAGNOSIS — Z720 Tobacco use: Secondary | ICD-10-CM

## 2013-04-04 NOTE — Progress Notes (Signed)
Patient ID: [name], male   DOB: September 06, 1950, 63 y.o.   MRN: [id number]  Subjective:   Patient ID: [name] male   DOB: 03-19-1950 63 y.o.   MRN: [id number]  HPI: [name] is a 63 y.o. with PMH atrial tachycardia with a pacer, h/o CVA '10, ED, HLD, and uncontrolled HTN presents for an ED f/u and a f/u for his HTN.  Pt was seen in the ED on 1/21 for left sided chest pain near his pacemaker that had been intermittent for 2-3 days prior to arrival to the ED. EKG and CXR without acute changes. BMP, CBC, and i-stat troponin were all normal The pt feels that this was musculoskeletal pain related to his pacemaker placement and that he sleeps on his left side, which he was told not to do by his Cardiologist. He states that since this time, the pain has resolved.   He endorses compliance with his medications, and states that he feels his BP is better controlled.    Past Medical History  Diagnosis Date  . Tuberculosis at age 95-3  . Sickle cell trait   . Stroke 10/03/08    Right thalamus  . Hypertension     goal < 140/90  . Emphysema     per multiple CXRs  . Snoring     Sleep study 09/02/10 was WNL  . CEREBROVASCULAR ACCIDENT 10/17/2008    Acute CVA of right thalamus 10/03/08 ECHO performed 2/2 CVA 10/09/08, EF 55-65%  Aspirin 325 mg daily and try to work on quitting smoking.    Current Outpatient Prescriptions  Medication Sig Dispense Refill  . amLODipine (NORVASC) 5 MG tablet Take 1 tablet (5 mg total) by mouth daily.  30 tablet  11  . aspirin 325 MG tablet Take 325 mg by mouth every morning.      Marland Kitchen lisinopril (PRINIVIL,ZESTRIL) 40 MG tablet Take 1 tablet (40 mg total) by mouth every morning.  30 tablet  6  . meloxicam (MOBIC) 15 MG tablet TAKE ONE TABLET BY MOUTH EVERY DAY WITH FOOD FOR BACK PAIN  30 tablet  1  . HYDROcodone-acetaminophen (HYCET) 7.5-325 mg/15 ml solution Take 15 mLs by mouth 4 (four) times daily as needed for moderate pain.  120 mL  0  . zoster  vaccine live, PF, (ZOSTAVAX) 61950 UNT/0.65ML injection Inject 19,400 Units into the skin once.  1 each  0   No current facility-administered medications for this visit.   Family History  Problem Relation Age of Onset  . Cancer Sister   . Sickle cell anemia Son    History   Social History  . Marital Status: Married    Spouse Name: N/A    Number of Children: N/A  . Years of Education: N/A   Occupational History  . unemployed    Social History Main Topics  . Smoking status: Current Every Day Smoker -- 0.20 packs/day for 10 years    Types: Cigarettes  . Smokeless tobacco: None     Comment: Cutting back.  . Alcohol Use: No  . Drug Use: No     Comment: previous polysubstance abuser, quit x 13 years  . Sexual Activity: None   Other Topics Concern  . None   Social History Narrative   Lives in [place].  Works as a Pharmacologist for [employer]   Review of Systems: A 12 point ROS was performed; pertinent positives and negatives were noted in the HPI   Objective:  Physical Exam: Filed Vitals:   04/04/13 0837  BP: 135/96  Pulse: 84  Temp: 97.2 F (36.2 C)  Height: 6' 1.25" (1.861 m)  Weight: 165 lb 4.8 oz (74.98 kg)  SpO2: 99%   Constitutional: Vital signs reviewed. Patient is a well-developed and well-nourished male in no acute distress and cooperative with exam.  Head: Normocephalic and atraumatic  Eyes: PERRL, EOMI  Neck: Supple, Trachea midline, normal ROM, No JVD  Cardiovascular: RRR, no MRG, pulses symmetric and intact bilaterally  Pulmonary/Chest: Normal respiratory effort, CTAB, no wheezes, rales, or rhonchi  Abdominal: Soft. Non-tender, non-distended, bowel sounds are normal, no masses, organomegaly, or guarding present.  GU: no CVA tenderness Musculoskeletal: No joint deformities, erythema, or stiffness,  Neurological: A&O x3, Strength is normal and symmetric bilaterally, cranial nerve II-XII are grossly intact, non focal  Skin: Warm,  dry and intact.  Psychiatric: Normal mood and affect. speech and behavior is normal.    Assessment & Plan:   Please refer to Problem List based Assessment and Plan

## 2013-04-04 NOTE — Assessment & Plan Note (Signed)
BP Readings from Last 3 Encounters:  04/04/13 135/96  03/29/13 154/107  02/22/13 142/101    Lab Results  Component Value Date   NA 143 03/29/2013   K 4.1 03/29/2013   CREATININE 0.85 03/29/2013    Assessment: Blood pressure control: controlled Progress toward BP goal:  improved Comments: His blood pressure looks pretty good today- improving! Given that he has chronic HTN, and has hypotension, I do not want to drop his pressures too quickly by increasing the amlodipine.  Plan: Medications:  continue current medications Educational resources provided: brochure;handout;video Self management tools provided:   Other plans: F/u in 1 month.

## 2013-04-04 NOTE — Patient Instructions (Signed)
Continue to work on quitting smoking. This will improve your heart health and your breathing and will also help improve your blood pressure.   Keep taking the Lisinopril 40mg  daily and the amlodipine 5mg  daily. I will see you back here in 1 month for a blood pressure recheck.

## 2013-04-04 NOTE — Assessment & Plan Note (Signed)
  Assessment: Progress toward smoking cessation:  smoking the same amount Barriers to progress toward smoking cessation:   Addiction and per pt, he likes the taste Comments: Pt considering trying Chantix but is not ready yet.  Plan: Instruction/counseling given:  I counseled patient on the dangers of tobacco use, advised patient to stop smoking, and reviewed strategies to maximize success. Educational resources provided:  [facility] (1-800-QUIT-NOW) brochure Self management tools provided:    Medications to assist with smoking cessation:  None. Pt considering starting

## 2013-04-06 NOTE — Progress Notes (Signed)
Case discussed with Dr. Glenn soon after the resident saw the patient.  We reviewed the resident's history and exam and pertinent patient test results.  I agree with the assessment, diagnosis, and plan of care documented in the resident's note. 

## 2013-04-13 ENCOUNTER — Encounter: Payer: Medicare Other | Admitting: Internal Medicine

## 2013-04-18 ENCOUNTER — Encounter: Payer: Self-pay | Admitting: Internal Medicine

## 2013-05-03 NOTE — Addendum Note (Signed)
Addended by: Orson Gear on: 05/03/2013 12:12 PM   Modules accepted: Orders

## 2013-05-04 ENCOUNTER — Encounter: Payer: Medicare Other | Admitting: Internal Medicine

## 2013-05-08 ENCOUNTER — Ambulatory Visit (INDEPENDENT_AMBULATORY_CARE_PROVIDER_SITE_OTHER): Payer: Medicare Other | Admitting: Internal Medicine

## 2013-05-08 ENCOUNTER — Encounter: Payer: Self-pay | Admitting: Internal Medicine

## 2013-05-08 VITALS — BP 150/102 | HR 68 | Temp 97.7°F | Wt 167.8 lb

## 2013-05-08 DIAGNOSIS — N289 Disorder of kidney and ureter, unspecified: Secondary | ICD-10-CM | POA: Diagnosis not present

## 2013-05-08 DIAGNOSIS — I10 Essential (primary) hypertension: Secondary | ICD-10-CM | POA: Diagnosis not present

## 2013-05-08 DIAGNOSIS — M549 Dorsalgia, unspecified: Secondary | ICD-10-CM

## 2013-05-08 DIAGNOSIS — Z720 Tobacco use: Secondary | ICD-10-CM

## 2013-05-08 DIAGNOSIS — E785 Hyperlipidemia, unspecified: Secondary | ICD-10-CM | POA: Diagnosis not present

## 2013-05-08 DIAGNOSIS — N529 Male erectile dysfunction, unspecified: Secondary | ICD-10-CM | POA: Diagnosis not present

## 2013-05-08 DIAGNOSIS — Z299 Encounter for prophylactic measures, unspecified: Secondary | ICD-10-CM | POA: Diagnosis not present

## 2013-05-08 DIAGNOSIS — Z8611 Personal history of tuberculosis: Secondary | ICD-10-CM | POA: Diagnosis not present

## 2013-05-08 DIAGNOSIS — F17200 Nicotine dependence, unspecified, uncomplicated: Secondary | ICD-10-CM | POA: Diagnosis not present

## 2013-05-08 DIAGNOSIS — Z0000 Encounter for general adult medical examination without abnormal findings: Secondary | ICD-10-CM | POA: Diagnosis not present

## 2013-05-08 MED ORDER — AMLODIPINE BESYLATE 10 MG PO TABS: 10.0000 mg | ORAL_TABLET | Freq: Every day | ORAL | Status: DC

## 2013-05-08 MED ORDER — LISINOPRIL 40 MG PO TABS: 40.0000 mg | ORAL_TABLET | Freq: Every morning | ORAL | Status: DC

## 2013-05-08 MED ORDER — MELOXICAM 15 MG PO TABS: ORAL_TABLET | ORAL | Status: DC

## 2013-05-08 NOTE — Patient Instructions (Signed)
I am increasing the Amlodipine today. You blood pressure is pretty elevated. Come back to see me in 1 month.

## 2013-05-08 NOTE — Assessment & Plan Note (Signed)
  Assessment: Progress toward smoking cessation:  smoking the same amount Barriers to progress toward smoking cessation:   Lack of desire   Plan: Instruction/counseling given:  I counseled patient on the dangers of tobacco use, advised patient to stop smoking, and reviewed strategies to maximize success. Educational resources provided:  [place] Insurance account manager) brochure Self management tools provided:  smoking cessation plan (STAR Quit Plan) Patient agreed to the following self-care plans for smoking cessation: go to the [employer] (www.quitlinenc.com);cut down the number of cigarettes smoked

## 2013-05-08 NOTE — Progress Notes (Signed)
Patient ID: [name], male   DOB: 1950-10-19, 63 y.o.   MRN: [id number]  Subjective:   Patient ID: [name] male   DOB: [birth year] 63 y.o.   MRN: [id number]  HPI: [name] is a 63 y.o. with PMH atrial tachycardia with a pacer, h/o CVA '10, ED, HLD, and uncontrolled HTN presents for an ED f/u and a f/u for his HTN.  He states that he has not taken his BP meds this morning but does endorse compliance with his meds.  He denies any chest pain, SOB, N/V/diarrhea/constipation, abdominal pain, peripheral edema, weakness, or dizziness. He does endorse a mild posterior headache that he attributes to his elevated BP.    Past Medical History  Diagnosis Date  . Tuberculosis at age 24-3  . Sickle cell trait   . Stroke 10/03/08    Right thalamus  . Hypertension     goal < 140/90  . Emphysema     per multiple CXRs  . Snoring     Sleep study 09/02/10 was WNL  . CEREBROVASCULAR ACCIDENT 10/17/2008    Acute CVA of right thalamus 10/03/08 ECHO performed 2/2 CVA 10/09/08, EF 55-65%  Aspirin 325 mg daily and try to work on quitting smoking.    Current Outpatient Prescriptions  Medication Sig Dispense Refill  . amLODipine (NORVASC) 5 MG tablet Take 1 tablet (5 mg total) by mouth daily.  30 tablet  11  . aspirin 325 MG tablet Take 325 mg by mouth every morning.      Marland Kitchen HYDROcodone-acetaminophen (HYCET) 7.5-325 mg/15 ml solution Take 15 mLs by mouth 4 (four) times daily as needed for moderate pain.  120 mL  0  . lisinopril (PRINIVIL,ZESTRIL) 40 MG tablet Take 1 tablet (40 mg total) by mouth every morning.  30 tablet  6  . meloxicam (MOBIC) 15 MG tablet TAKE ONE TABLET BY MOUTH EVERY DAY WITH FOOD FOR BACK PAIN  30 tablet  1  . zoster vaccine live, PF, (ZOSTAVAX) 60630 UNT/0.65ML injection Inject 19,400 Units into the skin once.  1 each  0   No current facility-administered medications for this visit.   Family History  Problem Relation Age of Onset  . Cancer Sister   . Sickle cell  anemia Son    History   Social History  . Marital Status: Married    Spouse Name: N/A    Number of Children: N/A  . Years of Education: N/A   Occupational History  . unemployed    Social History Main Topics  . Smoking status: Current Every Day Smoker -- 0.30 packs/day for 10 years    Types: Cigarettes  . Smokeless tobacco: None     Comment: Cutting back.  . Alcohol Use: No  . Drug Use: No     Comment: previous polysubstance abuser, quit x 13 years  . Sexual Activity: None   Other Topics Concern  . None   Social History Narrative   Lives in [place].  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  Patient is a well-developed and well-nourished male in no acute distress and cooperative  with exam.  Head: Normocephalic and atraumatic Mouth: no erythema or exudates, MMM Eyes: PERRL, EOMI, conjunctivae normal Cardiovascular: RRR, no MRG Pulmonary/Chest: Normal respiratory effort, CTAB, no wheezes, rales, or rhonchi Abdominal: Soft. Non-tender, non-distended, hypoactive bowel sounds Musculoskeletal: No joint deformities, erythema, or stiffness, ROM full and no nontender Neurological: A&O x3, cranial nerve II-XII are grossly intact, no focal deficit Psychiatric: Normal mood and affect. speech and behavior is normal.   Assessment & Plan:   Please refer to Problem List based Assessment and Plan

## 2013-05-08 NOTE — Assessment & Plan Note (Signed)
BP Readings from Last 3 Encounters:  05/08/13 150/102  04/04/13 135/96  03/29/13 154/107    Lab Results  Component Value Date   NA 143 03/29/2013   K 4.1 03/29/2013   CREATININE 0.85 03/29/2013    Assessment: Blood pressure control: moderately elevated Progress toward BP goal:  deteriorated (pt did not take his meds this am)  Plan: Medications:  continue current medications, but will increase his amlodipine to 10mg  daily.  Educational resources provided: brochure Self management tools provided: home blood pressure logbook Other plans: F/u in 1 month and will check BMP at that time.

## 2013-05-11 NOTE — Progress Notes (Signed)
Case discussed with Dr. Glenn at the time of the visit.  We reviewed the resident's history and exam and pertinent patient test results.  I agree with the assessment, diagnosis, and plan of care documented in the resident's note.   

## 2013-05-18 ENCOUNTER — Encounter: Payer: Self-pay | Admitting: Internal Medicine

## 2013-05-18 ENCOUNTER — Ambulatory Visit (INDEPENDENT_AMBULATORY_CARE_PROVIDER_SITE_OTHER): Payer: Medicare Other | Admitting: Internal Medicine

## 2013-05-18 VITALS — BP 147/100 | HR 62 | Temp 96.8°F | Ht 73.25 in | Wt 165.0 lb

## 2013-05-18 DIAGNOSIS — I10 Essential (primary) hypertension: Secondary | ICD-10-CM

## 2013-05-18 DIAGNOSIS — F17200 Nicotine dependence, unspecified, uncomplicated: Secondary | ICD-10-CM

## 2013-05-18 DIAGNOSIS — R51 Headache: Secondary | ICD-10-CM

## 2013-05-18 DIAGNOSIS — E785 Hyperlipidemia, unspecified: Secondary | ICD-10-CM | POA: Diagnosis not present

## 2013-05-18 DIAGNOSIS — H539 Unspecified visual disturbance: Secondary | ICD-10-CM | POA: Diagnosis not present

## 2013-05-18 DIAGNOSIS — Z720 Tobacco use: Secondary | ICD-10-CM

## 2013-05-18 MED ORDER — SIMVASTATIN 20 MG PO TABS: 20.0000 mg | ORAL_TABLET | Freq: Every day | ORAL | Status: DC

## 2013-05-18 MED ORDER — POLYETHYLENE GLYCOL 3350 17 G PO PACK: 17.0000 g | PACK | Freq: Two times a day (BID) | ORAL | Status: DC

## 2013-05-18 NOTE — Progress Notes (Signed)
Patient ID: [name], male   DOB: 1950/03/30, 63 y.o.   MRN: [id number]  Subjective:   Patient ID: [name] male   DOB: [birth year] 63 y.o.   MRN: [id number]  HPI: [name] is a 63 y.o. M w/ PMH atrial tachycardia with a pacer, h/o CVA '10, ED, HLD, and uncontrolled HTN presents for blood pressure follow up.  He was seen on 3/2 and his blood pressure was found to be elevated to 150/102. His amlodipine was increased to 10mg . He was supposed to f/u in 1 month, but presents today.   Pt states that he recently took some OTC herbal ED medication, which he states worked. He is unsure of the name.   He states that his vision when reading has been worsening with intermittent episodes of blurred vision that is not associated with headaches, dizziness, changes in speech, weakness or other neurologic symptoms. He endorses headaches at the back of his head at the base of the neck that are relieved with massaging the area. He states that they are always present but have not worsened. He states that he has not been to the eye doctor in a while b/c it is not covered by Medicare. He feels that his headaches are related to his vision vs his blood pressure.   He states that he is taking his blood pressure medications; however, he is not taking the increased amlodipine dose and is still taking the 5mg .   He denies any fevers, chills, dizziness, numbness or tingling, weakness, nausea, or vomiting.   Past Medical History  Diagnosis Date  . Tuberculosis at age 65-3  . Sickle cell trait   . Stroke 10/03/08    Right thalamus  . Hypertension     goal < 140/90  . Emphysema     per multiple CXRs  . Snoring     Sleep study 09/02/10 was WNL  . CEREBROVASCULAR ACCIDENT 10/17/2008    Acute CVA of right thalamus 10/03/08 ECHO performed 2/2 CVA 10/09/08, EF 55-65%  Aspirin 325 mg daily and try to work on quitting smoking.    Current Outpatient Prescriptions  Medication Sig Dispense Refill  .  amLODipine (NORVASC) 10 MG tablet Take 1 tablet (10 mg total) by mouth daily.  30 tablet  1  . aspirin 325 MG tablet Take 325 mg by mouth every morning.      Marland Kitchen lisinopril (PRINIVIL,ZESTRIL) 40 MG tablet Take 1 tablet (40 mg total) by mouth every morning.  30 tablet  6  . meloxicam (MOBIC) 15 MG tablet TAKE ONE TABLET BY MOUTH EVERY DAY WITH FOOD FOR BACK PAIN  30 tablet  3  . polyethylene glycol (MIRALAX / GLYCOLAX) packet Take 17 g by mouth 2 (two) times daily.  100 each  0  . simvastatin (ZOCOR) 20 MG tablet Take 1 tablet (20 mg total) by mouth daily.  30 tablet  6   No current facility-administered medications for this visit.   Family History  Problem Relation Age of Onset  . Cancer Sister   . Sickle cell anemia Son    History   Social History  . Marital Status: Married    Spouse Name: N/A    Number of Children: N/A  . Years of Education: N/A   Occupational History  . unemployed    Social History Main Topics  . Smoking status: Current Every Day Smoker -- 0.30 packs/day for 10 years    Types: Cigarettes  . Smokeless tobacco: None  Comment: Cutting back.  . Alcohol Use: No  . Drug Use: No     Comment: previous polysubstance abuser, quit x 13 years  . Sexual Activity: None   Other Topics Concern  . None   Social History Narrative   Lives in [place].  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  Patient is a well-developed and well-nourished male in no acute distress and cooperative with exam.   Head: Normocephalic and atraumatic Eyes: PERRL, EOMI, conjunctivae normal. Visual acuity 20/40.  Neck: Supple,  Trachea midline normal ROM, No JVD, mass, thyromegaly. TTP at base of the skull.  Cardiovascular: RRR, no MRG, pulses symmetric and intact bilaterally Pulmonary/Chest: normal respiratory effort, CTAB, no wheezes, rales, or rhonchi Abdominal: Soft. Non-tender, non-distended Musculoskeletal: No joint deformities, erythema, or stiffness, ROM full  Hematology: No cervical adenopathy.  Neurological: A&O x3, Strength is normal and symmetric bilaterally, cranial nerve II-XII are grossly intact, no focal motor deficit, sensory intact to light touch bilaterally.  Skin: Warm, dry and intact.   Psychiatric: Normal mood and affect. speech and behavior is normal.   Assessment & Plan:   Please refer to Problem List based Assessment and Plan

## 2013-05-19 DIAGNOSIS — R519 Headache, unspecified: Secondary | ICD-10-CM | POA: Insufficient documentation

## 2013-05-19 DIAGNOSIS — R51 Headache: Secondary | ICD-10-CM | POA: Insufficient documentation

## 2013-05-19 NOTE — Assessment & Plan Note (Addendum)
Lipid panel with mildly elevated LDL with normal total cholesterol. Restarting statin. - simvastatin 20mg  daily

## 2013-05-19 NOTE — Assessment & Plan Note (Signed)
Pt with intermittent blurry vision with reading, not associated with headaches, dizziness, weakness, changes in speech, or other neurologic symptoms. No neurological deficits on exam. Visual acuity is 20/40. He has glasses that he states needs to be updated. He is overdue for his yearly eye exam and needs to be seen by his ophthalmologist, will re-refer. - Opthalmology referral

## 2013-05-19 NOTE — Assessment & Plan Note (Addendum)
BP Readings from Last 3 Encounters:  05/18/13 147/100  05/08/13 150/102  04/04/13 135/96    Lab Results  Component Value Date   NA 143 03/29/2013   K 4.1 03/29/2013   CREATININE 0.85 03/29/2013    Assessment: Blood pressure control: moderately elevated Progress toward BP goal:  unchanged Comments: Pt did not start the increased dose of the amlodipine- he is still taking the 5mg . He is still smoking.  Plan: Medications:  He is to begin the Amlodipine 10mg  and continue the lisinopril 40mg  daily Educational resources provided: brochure;handout;video Self management tools provided:   Other plans: f/u in 2-4 weeks for blood pressure check. He was encouraged to stop smoking.

## 2013-05-19 NOTE — Assessment & Plan Note (Signed)
Located at the base of the skull with tenderness to palpation bilaterally. Pain improved with massage. No neurological deficits associated. Likely related to tension. Will continue to monitor.

## 2013-06-01 NOTE — Progress Notes (Signed)
Case discussed with Dr. Glenn at the time of the visit.  We reviewed the resident's history and exam and pertinent patient test results.  I agree with the assessment, diagnosis, and plan of care documented in the resident's note.   

## 2013-06-08 ENCOUNTER — Encounter: Payer: Self-pay | Admitting: Internal Medicine

## 2013-06-08 ENCOUNTER — Ambulatory Visit (INDEPENDENT_AMBULATORY_CARE_PROVIDER_SITE_OTHER): Payer: Medicare Other | Admitting: Internal Medicine

## 2013-06-08 VITALS — BP 127/88 | HR 64 | Temp 97.1°F

## 2013-06-08 DIAGNOSIS — R51 Headache: Secondary | ICD-10-CM | POA: Diagnosis not present

## 2013-06-08 DIAGNOSIS — E785 Hyperlipidemia, unspecified: Secondary | ICD-10-CM | POA: Diagnosis not present

## 2013-06-08 DIAGNOSIS — I10 Essential (primary) hypertension: Secondary | ICD-10-CM | POA: Diagnosis not present

## 2013-06-08 DIAGNOSIS — F17200 Nicotine dependence, unspecified, uncomplicated: Secondary | ICD-10-CM

## 2013-06-08 DIAGNOSIS — H539 Unspecified visual disturbance: Secondary | ICD-10-CM | POA: Diagnosis not present

## 2013-06-08 DIAGNOSIS — Z720 Tobacco use: Secondary | ICD-10-CM

## 2013-06-08 NOTE — Patient Instructions (Signed)
Keep up the good work!!!  Quit smoking!!!

## 2013-06-08 NOTE — Assessment & Plan Note (Addendum)
BP Readings from Last 3 Encounters:  06/08/13 127/88  05/18/13 147/100  05/08/13 150/102    Lab Results  Component Value Date   NA 143 03/29/2013   K 4.1 03/29/2013   CREATININE 0.85 03/29/2013    Assessment: Blood pressure control: controlled Progress toward BP goal:  at goal!!!! And his is not having the headaches any longer   Plan: Medications:  continue current medications Other plans: He is still smoking and was encouraged to continue working on cessation which will further improve his BP.    F/u in 3-6 mo

## 2013-06-08 NOTE — Progress Notes (Signed)
INTERNAL MEDICINE TEACHING ATTENDING ADDENDUM - Nischal Narendra, MD: I reviewed and discussed at the time of visit with the resident Dr. Glenn, the patient's medical history, physical examination, diagnosis and results of tests and treatment and I agree with the patient's care as documented 

## 2013-06-08 NOTE — Progress Notes (Signed)
Patient ID: [name], male   DOB: 20-May-1950, 63 y.o.   MRN: [id number]  Subjective:   Patient ID: [name] male   DOB: 1950/11/01 63 y.o.   MRN: [id number]  HPI: [name] is a 63 y.o. M w/ PMH atrial tachycardia with a pacer, h/o CVA '10, ED, HLD, and uncontrolled HTN presents for blood pressure recheck.  Pt states he is feeling very good today. He denies any headaches, acute vision changes, or dizziness.   He states that he is taking the Amlodipine 10mg  daily (he was only taking 5mg  at his last visit) and the Lisinopril 40 mg daily.    Past Medical History  Diagnosis Date  . Tuberculosis at age 36-3  . Sickle cell trait   . Stroke 10/03/08    Right thalamus  . Hypertension     goal < 140/90  . Emphysema     per multiple CXRs  . Snoring     Sleep study 09/02/10 was WNL  . CEREBROVASCULAR ACCIDENT 10/17/2008    Acute CVA of right thalamus 10/03/08 ECHO performed 2/2 CVA 10/09/08, EF 55-65%  Aspirin 325 mg daily and try to work on quitting smoking.    Current Outpatient Prescriptions  Medication Sig Dispense Refill  . amLODipine (NORVASC) 10 MG tablet Take 1 tablet (10 mg total) by mouth daily.  30 tablet  1  . aspirin 325 MG tablet Take 325 mg by mouth every morning.      Marland Kitchen lisinopril (PRINIVIL,ZESTRIL) 40 MG tablet Take 1 tablet (40 mg total) by mouth every morning.  30 tablet  6  . meloxicam (MOBIC) 15 MG tablet TAKE ONE TABLET BY MOUTH EVERY DAY WITH FOOD FOR BACK PAIN  30 tablet  3  . polyethylene glycol (MIRALAX / GLYCOLAX) packet Take 17 g by mouth 2 (two) times daily.  100 each  0  . simvastatin (ZOCOR) 20 MG tablet Take 1 tablet (20 mg total) by mouth daily.  30 tablet  6   No current facility-administered medications for this visit.   Family History  Problem Relation Age of Onset  . Cancer Sister   . Sickle cell anemia Son    History   Social History  . Marital Status: Married    Spouse Name: N/A    Number of Children: N/A  . Years of  Education: N/A   Occupational History  . unemployed    Social History Main Topics  . Smoking status: Current Every Day Smoker -- 0.30 packs/day for 10 years    Types: Cigarettes  . Smokeless tobacco: Not on file     Comment: Cutting back.  . Alcohol Use: No  . Drug Use: No     Comment: previous polysubstance abuser, quit x 13 years  . Sexual Activity: Not on file   Other Topics Concern  . Not on file   Social History Narrative   Lives in [place].  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  Patient is a well-developed and well-nourished male in no acute distress and cooperative with exam.   Head: Normocephalic and atraumatic Eyes: PERRL, EOMI, conjunctivae normal, No scleral icterus.  Neck: Supple, Trachea midline, normal ROM, No JVD, mass, thyromegaly Cardiovascular: RRR, S1 normal, S2 normal, no MRG, pulses symmetric and intact bilaterally Pulmonary/Chest: Normal respiratory effort, CTAB, no wheezes, rales, or rhonchi Abdominal: Soft. Non-tender, non-distended, bowel sounds are normal Musculoskeletal: No joint deformities or stiffness, ROM full Hematology: No cervical adenopathy.  Neurological: A&O x3, non-focal Psychiatric: Normal mood and affect. Speech and behavior is normal.   Assessment & Plan:   Please refer to Problem List based Assessment and Plan

## 2013-06-08 NOTE — Assessment & Plan Note (Signed)
  Assessment: Progress toward smoking cessation:  smoking the same amount Barriers to progress toward smoking cessation:      Plan: Instruction/counseling given:  I counseled patient on the dangers of tobacco use, advised patient to stop smoking, and reviewed strategies to maximize success.

## 2013-06-26 NOTE — Addendum Note (Signed)
Addended by: Yvonna Alanis E on: 06/26/2013 11:30 AM   Modules accepted: Orders

## 2013-07-26 ENCOUNTER — Telehealth: Payer: Self-pay | Admitting: *Deleted

## 2013-07-26 ENCOUNTER — Encounter: Payer: Self-pay | Admitting: Internal Medicine

## 2013-07-26 ENCOUNTER — Ambulatory Visit (INDEPENDENT_AMBULATORY_CARE_PROVIDER_SITE_OTHER): Payer: Medicare Other | Admitting: Internal Medicine

## 2013-07-26 VITALS — BP 123/89 | HR 85 | Temp 97.8°F | Ht 73.25 in | Wt 164.7 lb

## 2013-07-26 DIAGNOSIS — K130 Diseases of lips: Secondary | ICD-10-CM | POA: Diagnosis not present

## 2013-07-26 DIAGNOSIS — I10 Essential (primary) hypertension: Secondary | ICD-10-CM | POA: Diagnosis not present

## 2013-07-26 DIAGNOSIS — H539 Unspecified visual disturbance: Secondary | ICD-10-CM | POA: Diagnosis not present

## 2013-07-26 DIAGNOSIS — R220 Localized swelling, mass and lump, head: Secondary | ICD-10-CM | POA: Insufficient documentation

## 2013-07-26 DIAGNOSIS — E785 Hyperlipidemia, unspecified: Secondary | ICD-10-CM | POA: Diagnosis not present

## 2013-07-26 DIAGNOSIS — R51 Headache: Secondary | ICD-10-CM | POA: Diagnosis not present

## 2013-07-26 DIAGNOSIS — F17200 Nicotine dependence, unspecified, uncomplicated: Secondary | ICD-10-CM | POA: Diagnosis not present

## 2013-07-26 MED ORDER — HYDROCHLOROTHIAZIDE 25 MG PO TABS: 25.0000 mg | ORAL_TABLET | Freq: Every day | ORAL | Status: DC

## 2013-07-26 MED ORDER — HYDROCHLOROTHIAZIDE 12.5 MG PO TABS: 25.0000 mg | ORAL_TABLET | Freq: Every day | ORAL | Status: DC

## 2013-07-26 NOTE — Assessment & Plan Note (Signed)
Well controlled. Stopping ACE-I for a possible allergic reaction (upper lip swelling and numbness).  Plans: D/C Lisinopril. Start HCTZ 25 mg qd. F/U in a week to check BP, BMP.

## 2013-07-26 NOTE — Assessment & Plan Note (Signed)
Swelling and numbness of the right lateral half of the upper lip. Unclear etiology. Differentials include allergic vs possible ACE-I related. In the absence of other allergic symptoms, it is difficult to substantiate that his swelling/numbness of the right lateral half of the upper lip is from allergy. Discussed with the attending regarding further management and plan.  Plans: Stop ACE-I as it is possible that they can cause lip swelling, angioedema even after several years being on them. (although difficult to prove). Start HCTZ 25 mg qdaily. 1 week f/u to check his BP, BMP.  F/U if symptoms get worse or persist or go to ED if he develop tongue swelling.

## 2013-07-26 NOTE — Telephone Encounter (Signed)
Pt calls and states his Rlip became numb in the corner this am. Denies shortness of breath, chest pain, trouble swallowing. appt [id number] dr Eyvonne Mechanic, cautioned that if it becomes difficult to swallow, breath, chest pain call 911 and come straight to ED. He is agreeable

## 2013-07-26 NOTE — Progress Notes (Signed)
Subjective:   Patient ID: [name] male   DOB: [birth year] 63 y.o.   MRN: [id number]  HPI: [name] is a 63 y.o. gentleman with PMH significant for HTN, SA node dysfunction s/p pacemaker placement, H/O CVA in 2010 with no residual weakness comes to the office with CC of swelling, numbness of the right lateral half of the upper lip.  Patient reports that he woke up this morning and he noticed numbness and swelling of the lateral half of the upper lip. He was concerned for a possible CVA and called clinic to be seen. Patient reports that the swelling is "tremendously better" and the numbness is also improved. He denies any runny nose, head aches, sore throat or other upper respiratory symptoms. He denies any facial drooping, arm/leg weakness, speech difficulty or any other symptoms. He denies exposure to any allergens, eating new food or starting any new medicine.  He denies any other complaints.  Past Medical History  Diagnosis Date  . Tuberculosis at age 74-3  . Sickle cell trait   . Stroke 10/03/08    Right thalamus  . Hypertension     goal < 140/90  . Emphysema     per multiple CXRs  . Snoring     Sleep study 09/02/10 was WNL  . CEREBROVASCULAR ACCIDENT 10/17/2008    Acute CVA of right thalamus 10/03/08 ECHO performed 2/2 CVA 10/09/08, EF 55-65%  Aspirin 325 mg daily and try to work on quitting smoking.    Current Outpatient Prescriptions  Medication Sig Dispense Refill  . amLODipine (NORVASC) 10 MG tablet Take 1 tablet (10 mg total) by mouth daily.  30 tablet  1  . aspirin 325 MG tablet Take 325 mg by mouth every morning.      Marland Kitchen lisinopril (PRINIVIL,ZESTRIL) 40 MG tablet Take 1 tablet (40 mg total) by mouth every morning.  30 tablet  6  . meloxicam (MOBIC) 15 MG tablet TAKE ONE TABLET BY MOUTH EVERY DAY WITH FOOD FOR BACK PAIN  30 tablet  3  . simvastatin (ZOCOR) 20 MG tablet Take 1 tablet (20 mg total) by mouth daily.  30 tablet  6  . polyethylene glycol (MIRALAX /  GLYCOLAX) packet Take 17 g by mouth 2 (two) times daily.  100 each  0   No current facility-administered medications for this visit.   Family History  Problem Relation Age of Onset  . Cancer Sister   . Sickle cell anemia Son    History   Social History  . Marital Status: Married    Spouse Name: N/A    Number of Children: N/A  . Years of Education: N/A   Occupational History  . unemployed    Social History Main Topics  . Smoking status: Current Every Day Smoker -- 0.30 packs/day for 10 years    Types: Cigarettes  . Smokeless tobacco: None     Comment: Cutting back.  . Alcohol Use: No  . Drug Use: No     Comment: previous polysubstance abuser, quit x 13 years  . Sexual Activity: None   Other Topics Concern  . None   Social History Narrative   Lives in [place].  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 Objective:  Physical Exam: Filed Vitals:   07/26/13 1336  BP: 123/89  Pulse: 85  Temp: 97.8 F (36.6 C)  TempSrc: Oral  Height: 6' 1.25" (1.861 m)  Weight: 164 lb 11.2 oz (74.707 kg)  SpO2: 99%   Constitutional: Vital signs reviewed.  Patient is a well-developed and well-nourished and is in no acute distress and cooperative with exam. Alert and oriented x3.  Head: Normocephalic and atraumatic Nose: No erythema or drainage noted.  Turbinates normal Mouth: Right lateral half of the upper lip is swollen. There is no induration or erythema noted. There is no tenderness to palpation over the lesion. There is no swelling of the tongue, lower lip or right medial half or left half of the upper lip. There are no signs of infected gum or teeth. Neck:No carotid bruit present.  Cardiovascular: RRR, S1 normal, S2 normal, no MRG. Pulmonary/Chest: normal respiratory effort, CTAB, no wheezes, rales, or rhonchi Neurological: A&O x3, Strength is normal and symmetric bilaterally, cranial nerve II-XII are  grossly intact, no focal motor deficit, sensory intact to light touch bilaterally.  Skin: Warm, dry and intact.  Psychiatric: Normal mood and affect.    Assessment & Plan:

## 2013-07-26 NOTE — Patient Instructions (Addendum)
STOP TAKING THE LISINOPRIL. Start taking Hydrochlorthiazide 12.5mg  two tablets once daily. If your symptoms persist or worsen, seek immediate medical help or go to emergency.

## 2013-07-26 NOTE — Progress Notes (Signed)
INTERNAL MEDICINE TEACHING ATTENDING ADDENDUM - Yerania Chamorro, MD: I reviewed and discussed with the resident Dr. Boggala, the patient's medical history, physical examination, diagnosis and results of pertinent tests and treatment and I agree with the patient's care as documented. 

## 2013-08-02 ENCOUNTER — Ambulatory Visit: Payer: Medicare Other | Admitting: Internal Medicine

## 2013-08-11 ENCOUNTER — Encounter: Payer: Medicare Other | Admitting: Internal Medicine

## 2013-08-15 ENCOUNTER — Encounter: Payer: Self-pay | Admitting: Internal Medicine

## 2013-08-21 ENCOUNTER — Telehealth: Payer: Self-pay | Admitting: Licensed Clinical Social Worker

## 2013-08-21 NOTE — Telephone Encounter (Signed)
CSW placed call to discuss potential barriers that prevent pt from making appointments.  [name] states because of his stroke he often does not remember appointments and it depends on how he is feeling the day of the appointment.  Transportation is not a barrier.  Pt aware he has an appointment tomorrow and denies any current barriers.

## 2013-08-22 ENCOUNTER — Ambulatory Visit (INDEPENDENT_AMBULATORY_CARE_PROVIDER_SITE_OTHER): Payer: Medicare Other | Admitting: Internal Medicine

## 2013-08-22 ENCOUNTER — Encounter: Payer: Self-pay | Admitting: Internal Medicine

## 2013-08-22 VITALS — BP 143/94 | HR 83 | Temp 97.3°F | Wt 164.8 lb

## 2013-08-22 DIAGNOSIS — N529 Male erectile dysfunction, unspecified: Secondary | ICD-10-CM | POA: Diagnosis not present

## 2013-08-22 DIAGNOSIS — Z720 Tobacco use: Secondary | ICD-10-CM

## 2013-08-22 DIAGNOSIS — H539 Unspecified visual disturbance: Secondary | ICD-10-CM | POA: Diagnosis not present

## 2013-08-22 DIAGNOSIS — R51 Headache: Secondary | ICD-10-CM | POA: Diagnosis not present

## 2013-08-22 DIAGNOSIS — F17200 Nicotine dependence, unspecified, uncomplicated: Secondary | ICD-10-CM | POA: Diagnosis not present

## 2013-08-22 DIAGNOSIS — I10 Essential (primary) hypertension: Secondary | ICD-10-CM | POA: Diagnosis not present

## 2013-08-22 DIAGNOSIS — E785 Hyperlipidemia, unspecified: Secondary | ICD-10-CM | POA: Diagnosis not present

## 2013-08-22 DIAGNOSIS — K130 Diseases of lips: Secondary | ICD-10-CM | POA: Diagnosis not present

## 2013-08-22 MED ORDER — SILDENAFIL CITRATE 50 MG PO TABS: 50.0000 mg | ORAL_TABLET | ORAL | Status: DC | PRN

## 2013-08-22 MED ORDER — METOPROLOL TARTRATE 12.5 MG HALF TABLET: 12.5000 mg | ORAL_TABLET | Freq: Two times a day (BID) | ORAL | Status: DC

## 2013-08-22 MED ORDER — METOPROLOL TARTRATE 12.5 MG HALF TABLET: ORAL_TABLET | ORAL | Status: DC

## 2013-08-22 NOTE — Progress Notes (Signed)
Patient ID: [name], male   DOB: September 28, 1950, 63 y.o.   MRN: [id number]  Subjective:   Patient ID: [name] male   DOB: 1951/01/20 63 y.o.   MRN: [id number]  HPI: [name] is a 63 y.o. M w/ PMH atrial tachycardia with a pacer, h/o CVA '10, ED, HLD, and uncontrolled HTN presents for a blood pressure check.  He states that he has not been taking his HCTZ in 1-2 weeks, as it makes him feel more fatigued in the heat and makes him "feel crazy". He denies headaches.   Pt still with ED. He states that he is having trouble getting an erection and maintaining one. He is requesting a refill of Viagra, which he has not taken in over 6 months.  He continues to smoke 3-5 cigarettes a day and has no desire to quit. He denies any SOB or wheezing.   Past Medical History  Diagnosis Date  . Tuberculosis at age 29-3  . Sickle cell trait   . Stroke 10/03/08    Right thalamus  . Hypertension     goal < 140/90  . Emphysema     per multiple CXRs  . Snoring     Sleep study 09/02/10 was WNL  . CEREBROVASCULAR ACCIDENT 10/17/2008    Acute CVA of right thalamus 10/03/08 ECHO performed 2/2 CVA 10/09/08, EF 55-65%  Aspirin 325 mg daily and try to work on quitting smoking.    Current Outpatient Prescriptions  Medication Sig Dispense Refill  . amLODipine (NORVASC) 10 MG tablet Take 1 tablet (10 mg total) by mouth daily.  30 tablet  1  . aspirin 325 MG tablet Take 325 mg by mouth every morning.      . hydrochlorothiazide (HYDRODIURIL) 25 MG tablet Take 1 tablet (25 mg total) by mouth daily.  30 tablet  3  . meloxicam (MOBIC) 15 MG tablet TAKE ONE TABLET BY MOUTH EVERY DAY WITH FOOD FOR BACK PAIN  30 tablet  3  . polyethylene glycol (MIRALAX / GLYCOLAX) packet Take 17 g by mouth 2 (two) times daily.  100 each  0  . simvastatin (ZOCOR) 20 MG tablet Take 1 tablet (20 mg total) by mouth daily.  30 tablet  6   No current facility-administered medications for this visit.   Family History   Problem Relation Age of Onset  . Cancer Sister   . Sickle cell anemia Son    History   Social History  . Marital Status: Married    Spouse Name: N/A    Number of Children: N/A  . Years of Education: N/A   Occupational History  . unemployed    Social History Main Topics  . Smoking status: Current Every Day Smoker -- 0.30 packs/day for 10 years    Types: Cigarettes  . Smokeless tobacco: None     Comment: Cutting back.  . Alcohol Use: No  . Drug Use: No     Comment: previous polysubstance abuser, quit x 13 years  . Sexual Activity: None   Other Topics Concern  . None   Social History Narrative   Lives in [place].  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  Patient is a well-developed and well-nourished male in no acute distress and cooperative with exam. Alert and oriented x3.  Head: Normocephalic and atraumatic Eyes: PERRL, EOMI Cardiovascular: RRR, no MRG Pulmonary/Chest: Normal respiratory effort, CTAB, no wheezes, rales, or rhonchi Abdominal: Soft. Non-tender, non-distended Musculoskeletal: No joint deformities, erythema, or stiffness, ROM full and no nontender Neurological: A&O x3, Strength is normal and symmetric bilaterally, cranial nerve II-XII are grossly intact, no focal motor deficit Skin: Warm, dry and intact. No rash, cyanosis, or clubbing.  Psychiatric: Normal mood and affect. speech and behavior is normal.  Assessment & Plan:   Please refer to Problem List based Assessment and Plan

## 2013-08-22 NOTE — Progress Notes (Signed)
INTERNAL MEDICINE TEACHING ATTENDING ADDENDUM - Aldine Contes, MD: I reviewed and discussed at the time of visit with the resident Dr. Eulas Post, the patient's medical history, physical examination, diagnosis and results of tests and treatment and I agree with the patient's care as documented

## 2013-08-22 NOTE — Assessment & Plan Note (Signed)
  Assessment: Progress toward smoking cessation:   None Barriers to progress toward smoking cessation:   lack of motivation Comments: Pt smoking about 4 cigarettes a day, and has no interest in quitting.   Plan: Instruction/counseling given:  I counseled patient on the dangers of tobacco use, advised patient to stop smoking, and reviewed strategies to maximize success.

## 2013-08-22 NOTE — Assessment & Plan Note (Addendum)
Pt requesting Viagra refill today for his ED. He has been on it in the past w/o adverse effects. Will hold off on prescribing today as I am starting a new BP medication and do not want to cause hypotension.  If his BP is improved at his next visit, then we will discuss restarting the Viagra.

## 2013-08-22 NOTE — Assessment & Plan Note (Addendum)
BP Readings from Last 3 Encounters:  08/22/13 143/94  07/26/13 123/89  06/08/13 127/88    Lab Results  Component Value Date   NA 143 03/29/2013   K 4.1 03/29/2013   CREATININE 0.85 03/29/2013    Assessment: Blood pressure control: mildly elevated Progress toward BP goal:  deteriorated Comments: Lisinopril was stopped at his last visit on 5/20 due to concerns for angioedema of the lip. He was started on HCTZ. He has not been taking the medication as it makes him "feel crazy".   Plan: Medications:  Pt to continue the Amlodipine 10mg  daily and will start low dose metroprolol 12.5mg  BID.  Educational resources provided:   Self management tools provided:   Other plans: F/u in 1 mo. If he does not tolerate the BB, may consider restarting the HCTZ at a lower dose and see if this is better tolerated.

## 2013-08-22 NOTE — Patient Instructions (Signed)
Begin taking the metoprolol 12.5mg  2 times a day.   Come back to see me in 2 weeks for a blood pressure check.    General Instructions:   Please bring your medicines with you each time you come to clinic.  Medicines may include prescription medications, over-the-counter medications, herbal remedies, eye drops, vitamins, or other pills.   Progress Toward Treatment Goals:  Treatment Goal 08/22/2013  Blood pressure deteriorated  Stop smoking -    Self Care Goals & Plans:  Self Care Goal 08/22/2013  Manage my medications take my medicines as prescribed  Monitor my health -  Eat healthy foods -  Be physically active -  Stop smoking -    No flowsheet data found.   Care Management & Community Referrals:  Referral 10/25/2012  Referrals made for care management support -  Referrals made to community resources smoking cessation

## 2013-08-29 ENCOUNTER — Telehealth: Payer: Self-pay | Admitting: *Deleted

## 2013-08-29 DIAGNOSIS — I10 Essential (primary) hypertension: Secondary | ICD-10-CM

## 2013-08-29 MED ORDER — METOPROLOL TARTRATE 25 MG PO TABS: 12.5000 mg | ORAL_TABLET | Freq: Two times a day (BID) | ORAL | Status: DC

## 2013-08-29 NOTE — Telephone Encounter (Signed)
Call from pt stating pharmacy didn't receive rx for his new "new blood pressure medicine".  Per Dr Eulas Post pt should be taking metoprolol 12.5mg  one twice a daily in addiction to the amlodipine he's already taking.   Pharmacy had to change rx to reflect metoprolol 25mg  tabs take one-half tab twice daily, new sig called into pharmacy, pt aware and will f/u as scheduled on 09/05/13.Despina Hidden Cassady6/23/201511:36 AM

## 2013-08-31 ENCOUNTER — Other Ambulatory Visit: Payer: Self-pay | Admitting: *Deleted

## 2013-08-31 DIAGNOSIS — I10 Essential (primary) hypertension: Secondary | ICD-10-CM

## 2013-09-01 MED ORDER — MELOXICAM 15 MG PO TABS: ORAL_TABLET | ORAL | Status: DC

## 2013-09-01 MED ORDER — AMLODIPINE BESYLATE 10 MG PO TABS: 10.0000 mg | ORAL_TABLET | Freq: Every day | ORAL | Status: DC

## 2013-09-05 ENCOUNTER — Encounter: Payer: Medicare Other | Admitting: Internal Medicine

## 2013-09-07 NOTE — Progress Notes (Signed)
This encounter was created in error - please disregard.

## 2013-09-19 ENCOUNTER — Encounter: Payer: Self-pay | Admitting: Internal Medicine

## 2013-09-19 ENCOUNTER — Ambulatory Visit (INDEPENDENT_AMBULATORY_CARE_PROVIDER_SITE_OTHER): Payer: Medicare Other | Admitting: Internal Medicine

## 2013-09-19 VITALS — BP 140/90 | HR 70 | Temp 97.8°F | Ht 73.0 in | Wt 167.1 lb

## 2013-09-19 DIAGNOSIS — I10 Essential (primary) hypertension: Secondary | ICD-10-CM | POA: Diagnosis not present

## 2013-09-19 DIAGNOSIS — E785 Hyperlipidemia, unspecified: Secondary | ICD-10-CM | POA: Diagnosis not present

## 2013-09-19 DIAGNOSIS — F17200 Nicotine dependence, unspecified, uncomplicated: Secondary | ICD-10-CM | POA: Diagnosis not present

## 2013-09-19 DIAGNOSIS — R51 Headache: Secondary | ICD-10-CM | POA: Diagnosis not present

## 2013-09-19 DIAGNOSIS — Z0000 Encounter for general adult medical examination without abnormal findings: Secondary | ICD-10-CM

## 2013-09-19 DIAGNOSIS — H539 Unspecified visual disturbance: Secondary | ICD-10-CM | POA: Diagnosis not present

## 2013-09-19 DIAGNOSIS — K130 Diseases of lips: Secondary | ICD-10-CM | POA: Diagnosis not present

## 2013-09-19 DIAGNOSIS — N529 Male erectile dysfunction, unspecified: Secondary | ICD-10-CM | POA: Diagnosis not present

## 2013-09-19 NOTE — Assessment & Plan Note (Addendum)
Initial BP 146/96, repeat seated 140/90 (goal < 140/90).  I am satisfied with his BP of 140/90 today.  It would be difficult to make changes today because he has contraindication to ACEI/ARBs, he does not want to retry HCTZ (says it made him feel crazy).  He has been taking 1.5 pills (37.5mg ) BID of metoprolol instead of 0.5 pill (12.5mg ) BID for the past month and this inadvertent medication overdose is likely causing his symptoms of nausea, dizziness, blurred vision and fatigue.  He has no signs of new CVA on exam.  He is not orthostatic. - continue amlodipine 10 daily - I instructed the patient to take 0.5 pill of metoprolol in the morning and 0.5 pill in the evening.  I asked him to repeat the instruction back to me and he was able to do so.  The pills are scored and he says he will be able to split them.  I informed him that he can also purchase a pill cutter to make it easier.  - He was instructed to reduce salt intake and increase activity (he stays active with his grandkids) to better control his BP - He declined referral to [facility] for home BP checks  - He will return to clinic in 2 weeks to check his BP on this regimen

## 2013-09-19 NOTE — Assessment & Plan Note (Addendum)
[name] asked me about prostate cancer screening.  He has not had prostate cancer screening before. Risk factors : he does not have a first degree relative that has been dx with prostate cancer before the age of 1 yrs or died of prostate cancer before the age of 50 yrs; he is African Optometrist. I informed him that prostate cancer screening is controversial and there are not clear-cut screening guidelines.  I discussed the points below:  Prostate cancer is common but in most cases, prostate cancer grows slowly and men die with rather than from prostate cancer. They will usually die from other causes.   Sometimes screening leads to procedures that have there own risks and may not confer a benefit.    PSA is an option should he decide he wants to be screened.  This would only involve a blood test.  Mr. Okane' decision on prostate cancer screening is : declined at this time but I encourage ongoing discussion with his PCP who knows his overall health history better.

## 2013-09-19 NOTE — Progress Notes (Signed)
   Subjective:    Patient ID: [name], male    DOB: 1950-11-20, 63 y.o.   MRN: [id number]  HPI Comments: Mr. Saville is a 63 year old male with PMH of CVA, SA node dysfunction s/p PPM, HTN, HLD, migraine and GERD who presents for follow-up of BP after medication change 1 month ago.  He also has c/o of nausea, fatigue, dizziness and blurry vision he feels is related to taking his amlodipine.  He can't relate it to a certain time of day.  Lasts most of the day, everyday x 1-2 months.  Symptoms improve in a cool environment.  He has not taken any OTCs (besides daily ASA) and reports compliance with prescribed medications.  Denies vomiting, weakness, chest pain, palpitations, lightheadedness or syncope.  His BP had been treated with amlodipine, lisinopril, HCTZ in the past.  Lisinopril was stopped 2/2 to angioedema.  HCTZ was stopped last month 2/2 to concerns of it making him "feel crazy."  "He was started on metoprolol at that time in addition to amlodipine.  When asked about his medication regimen, he reports taking 1.5 pills BID of metoprolol instead of half pill BID.     He denies EtOH or illicit drug use.  He smokes 2-3 cigarettes per day.  He has been smoking since he was 63 years old.  No desire to quit currently.      Review of Systems  Constitutional: Negative for fever, chills, appetite change and fatigue.  Eyes: Positive for visual disturbance. Negative for pain.  Respiratory: Negative for cough and shortness of breath.   Cardiovascular: Negative for chest pain, palpitations and leg swelling.  Gastrointestinal: Positive for nausea. Negative for vomiting, abdominal pain, diarrhea, constipation and blood in stool.  Endocrine: Negative for polydipsia.  Genitourinary: Positive for frequency. Negative for dysuria and hematuria.  Neurological: Positive for dizziness. Negative for syncope, weakness, light-headedness and headaches.       Objective:   Physical Exam  Vitals  reviewed. Constitutional: He is oriented to person, place, and time. He appears well-developed. No distress.  HENT:  Head: Normocephalic and atraumatic.  Mouth/Throat: Oropharynx is clear and moist. No oropharyngeal exudate.  Eyes: EOM are normal. Pupils are equal, round, and reactive to light.  Cardiovascular: Normal rate, regular rhythm and normal heart sounds.  Exam reveals no gallop and no friction rub.   No murmur heard. Pulmonary/Chest: Effort normal and breath sounds normal. No respiratory distress. He has no wheezes. He has no rales.  Abdominal: Soft. Bowel sounds are normal. He exhibits no distension. There is no tenderness.  Musculoskeletal: Normal range of motion. He exhibits no edema and no tenderness.  Neurological: He is alert and oriented to person, place, and time. No cranial nerve deficit. Coordination normal.  Skin: Skin is warm. He is not diaphoretic.  Psychiatric: He has a normal mood and affect. His behavior is normal.          Assessment & Plan:  Please see problem based assessment and plan.

## 2013-09-19 NOTE — Patient Instructions (Signed)
1. Please continue taking amlodipine once every day.  Please take metoprolol (1/2 pill) in the morning and (1/2 pill) in the evening.     2. Please take all medications as prescribed.     3. If you have worsening of your symptoms or new symptoms arise, please call the clinic ([phone]), or go to the ER immediately if symptoms are severe.   Return to clinic in 2 weeks for blood pressure check.

## 2013-09-20 NOTE — Progress Notes (Signed)
Case discussed with Dr. Wilson at the time of the visit.  We reviewed the resident's history and exam and pertinent patient test results.  I agree with the assessment, diagnosis, and plan of care documented in the resident's note. 

## 2013-09-25 ENCOUNTER — Encounter: Payer: Self-pay | Admitting: Internal Medicine

## 2013-09-25 ENCOUNTER — Ambulatory Visit (INDEPENDENT_AMBULATORY_CARE_PROVIDER_SITE_OTHER): Payer: Medicare Other | Admitting: Internal Medicine

## 2013-09-25 VITALS — BP 136/91 | HR 76 | Ht 73.0 in | Wt 164.8 lb

## 2013-09-25 DIAGNOSIS — F17200 Nicotine dependence, unspecified, uncomplicated: Secondary | ICD-10-CM

## 2013-09-25 DIAGNOSIS — I10 Essential (primary) hypertension: Secondary | ICD-10-CM | POA: Diagnosis not present

## 2013-09-25 DIAGNOSIS — I498 Other specified cardiac arrhythmias: Secondary | ICD-10-CM

## 2013-09-25 DIAGNOSIS — I495 Sick sinus syndrome: Secondary | ICD-10-CM | POA: Diagnosis not present

## 2013-09-25 DIAGNOSIS — Z720 Tobacco use: Secondary | ICD-10-CM

## 2013-09-25 DIAGNOSIS — I471 Supraventricular tachycardia: Secondary | ICD-10-CM

## 2013-09-25 DIAGNOSIS — I4719 Other supraventricular tachycardia: Secondary | ICD-10-CM

## 2013-09-25 NOTE — Progress Notes (Signed)
Otho Bellows, MD:  [name] is a 63 y.o. male with a h/o bradycardia sp PPM (MDT) by Dr Blanch Media in 2010 who presents today for follow-up in the Electrophysiology device clinic.   He is doing well at this time.  Today, he  denies symptoms of palpitations, chest pain, shortness of breath, orthopnea, PND, lower extremity edema, dizziness, presyncope, syncope, or neurologic sequela.  He smokes < 1/2 PPD but is not interested in quitting.  The patientis tolerating medications without difficulties and is otherwise without complaint today.   Past Medical History  Diagnosis Date  . Tuberculosis at age 47-3  . Sickle cell trait   . Stroke 10/03/08    Right thalamus  . Hypertension     goal < 140/90  . Emphysema     per multiple CXRs  . Snoring     Sleep study 09/02/10 was WNL  . CEREBROVASCULAR ACCIDENT 10/17/2008    Acute CVA of right thalamus 10/03/08 ECHO performed 2/2 CVA 10/09/08, EF 55-65%  Aspirin 325 mg daily and try to work on quitting smoking.    Past Surgical History  Procedure Laterality Date  . Appendectomy  1977  . Vasectomy  1990  . Pacemaker insertion  07/24/08    MDT Adapta L implanted by Dr Blanch Media    History   Social History  . Marital Status: Married    Spouse Name: N/A    Number of Children: N/A  . Years of Education: N/A   Occupational History  . unemployed    Social History Main Topics  . Smoking status: Current Every Day Smoker -- 0.30 packs/day for 10 years    Types: Cigarettes  . Smokeless tobacco: Not on file     Comment: Cutting back.  . Alcohol Use: No  . Drug Use: No     Comment: previous polysubstance abuser, quit x 13 years  . Sexual Activity: Not on file   Other Topics Concern  . Not on file   Social History Narrative   Lives in [place].  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 Cancer Sister   . Sickle cell anemia Son     Allergies  Allergen Reactions    . Ace Inhibitors     Patient presented with right upper lip swelling along with tingling and numbness. Possible allergic reaction to ACEI.    Current Outpatient Prescriptions  Medication Sig Dispense Refill  . amLODipine (NORVASC) 10 MG tablet Take 1 tablet (10 mg total) by mouth daily.  30 tablet  11  . aspirin 325 MG tablet Take 325 mg by mouth every morning.      . meloxicam (MOBIC) 15 MG tablet TAKE ONE TABLET BY MOUTH EVERY DAY WITH FOOD FOR BACK PAIN  30 tablet  3  . metoprolol tartrate (LOPRESSOR) 25 MG tablet Take 0.5 tablets (12.5 mg total) by mouth 2 (two) times daily.  30 tablet  2  . simvastatin (ZOCOR) 20 MG tablet Take 1 tablet (20 mg total) by mouth daily.  30 tablet  6   No current facility-administered medications for this visit.    ROS- all systems are reviewed and negative except as per HPI  Physical Exam: Filed Vitals:   09/25/13 1205  BP: 136/91  Pulse: 76  Height: 6\' 1"  (1.854 m)  Weight: 164 lb 12.8 oz (74.753 kg)    GEN- The patient is well appearing, alert  and oriented x 3 today.   Head- normocephalic, atraumatic Eyes-  Sclera clear, conjunctiva pink Ears- hearing intact Oropharynx- clear Neck- supple,  Lungs- Clear to ausculation bilaterally, normal work of breathing Chest- pacemaker pocket is well healed Heart- Regular rate and rhythm, no murmurs, rubs or gallops, PMI not laterally displaced GI- soft, NT, ND, + BS Extremities- no clubbing, cyanosis, or edema MS- no significant deformity or atrophy Skin- no rash or lesion Psych- euthymic mood, full affect Neuro- strength and sensation are intact  Pacemaker interrogation- reviewed in detail today,  See PACEART report  Assessment and Plan:  1. Sick sinus Normal pacemaker function See Pace Art report No changes today Will initiate home monitoring  2. htn Stable No change required today  3. Tobacco Cessation is strongly advised He is not yet ready to quite  carelink Return to see me  in 1 year

## 2013-09-25 NOTE — Patient Instructions (Signed)
Your physician recommends that you schedule a follow-up appointment in: 3 months with device clinic and 12 months with Dr Allred.   

## 2013-09-28 LAB — MDC_IDC_ENUM_SESS_TYPE_INCLINIC
Battery Remaining Longevity: 10
Battery Voltage: 2.79 V
Brady Statistic AP VP Percent: 0.1 % — CL
Brady Statistic AP VS Percent: 60.1 %
Brady Statistic AS VP Percent: 0.1 % — CL
Lead Channel Impedance Value: 448 Ohm
Lead Channel Pacing Threshold Pulse Width: 0.4 ms
Lead Channel Sensing Intrinsic Amplitude: 11.2 mV
Lead Channel Sensing Intrinsic Amplitude: 2.8 mV
Lead Channel Setting Pacing Amplitude: 2 V
Lead Channel Setting Pacing Pulse Width: 0.4 ms
Lead Channel Setting Sensing Sensitivity: 4 mV
MDC IDC MSMT BATTERY IMPEDANCE: 222 Ohm
MDC IDC MSMT LEADCHNL RA PACING THRESHOLD AMPLITUDE: 0.5 V
MDC IDC MSMT LEADCHNL RA PACING THRESHOLD PULSEWIDTH: 0.4 ms
MDC IDC MSMT LEADCHNL RV IMPEDANCE VALUE: 448 Ohm
MDC IDC MSMT LEADCHNL RV PACING THRESHOLD AMPLITUDE: 0.5 V
MDC IDC SET LEADCHNL RV PACING AMPLITUDE: 2.5 V
MDC IDC STAT BRADY AS VS PERCENT: 39.7 %

## 2013-10-03 ENCOUNTER — Encounter: Payer: Self-pay | Admitting: Internal Medicine

## 2013-10-03 ENCOUNTER — Ambulatory Visit (INDEPENDENT_AMBULATORY_CARE_PROVIDER_SITE_OTHER): Payer: Medicare Other | Admitting: Internal Medicine

## 2013-10-03 VITALS — BP 125/88 | HR 82 | Temp 97.0°F | Ht 73.0 in | Wt 167.4 lb

## 2013-10-03 DIAGNOSIS — Z791 Long term (current) use of non-steroidal anti-inflammatories (NSAID): Secondary | ICD-10-CM | POA: Diagnosis not present

## 2013-10-03 DIAGNOSIS — K130 Diseases of lips: Secondary | ICD-10-CM | POA: Diagnosis not present

## 2013-10-03 DIAGNOSIS — I10 Essential (primary) hypertension: Secondary | ICD-10-CM | POA: Diagnosis not present

## 2013-10-03 DIAGNOSIS — F17200 Nicotine dependence, unspecified, uncomplicated: Secondary | ICD-10-CM | POA: Diagnosis not present

## 2013-10-03 DIAGNOSIS — E785 Hyperlipidemia, unspecified: Secondary | ICD-10-CM | POA: Diagnosis not present

## 2013-10-03 DIAGNOSIS — N529 Male erectile dysfunction, unspecified: Secondary | ICD-10-CM | POA: Diagnosis not present

## 2013-10-03 DIAGNOSIS — R110 Nausea: Secondary | ICD-10-CM

## 2013-10-03 DIAGNOSIS — R51 Headache: Secondary | ICD-10-CM | POA: Diagnosis not present

## 2013-10-03 DIAGNOSIS — H539 Unspecified visual disturbance: Secondary | ICD-10-CM | POA: Diagnosis not present

## 2013-10-03 LAB — BASIC METABOLIC PANEL WITH GFR
BUN: 13 mg/dL (ref 6–23)
CO2: 27 mEq/L (ref 19–32)
Calcium: 9.3 mg/dL (ref 8.4–10.5)
Chloride: 105 mEq/L (ref 96–112)
Creat: 0.89 mg/dL (ref 0.50–1.35)
GFR, Est Non African American: >89 mL/min
Glucose, Bld: 89 mg/dL (ref 70–99)
POTASSIUM: 3.7 meq/L (ref 3.5–5.3)
Sodium: 140 mEq/L (ref 135–145)

## 2013-10-03 NOTE — Patient Instructions (Signed)
1. Please return to see Dr. Eulas Post for follow-up in 3 months or sooner if needed.   2. Please take all medications as prescribed.    3. If you have worsening of your symptoms or new symptoms arise, please call the clinic ([phone]), or go to the ER immediately if symptoms are severe.   4. Please stop taking meloxicam every night.  Use it less frequently.  Try using Tylenol, ice or heat.  If you start to have abdominal pain, vomiting, dark or bloody stools please return to clinic or seek emergency help.

## 2013-10-03 NOTE — Assessment & Plan Note (Addendum)
BP Readings from Last 3 Encounters:  10/03/13 125/88  09/25/13 136/91  09/19/13 140/90    Lab Results  Component Value Date   NA 143 03/29/2013   K 4.1 03/29/2013   CREATININE 0.85 03/29/2013    Assessment: Blood pressure control:  well controlled Progress toward BP goal:   at goal Comments:  His symptoms have improved with taking the correct dose of BP medication.  Plan: Medications:  continue current medications:  Norvasc 10mg  daily and Lopressor 12.5mg  BID Other plans: Return to clinic in 3 months for follow-up.

## 2013-10-03 NOTE — Assessment & Plan Note (Addendum)
His occasional nausea and abdominal burning likely 2/2 to NSAID gastropathy/gastritis.  No change in appetite, dark stools or weight loss.  He does not want to try H2 blocker or PPI.   - check BMP to assess renal function since he has been taking NSAID regularly - He has agreed to decrease meloxicam and try Tylenol prn for chronic low back pain.   - He was advised of symptoms that would warrant return to clinic or emergency care (i.e. Vomiting, severe abdominal pain, bloody or dark stools, weight loss, lightheadedness).   - He will return to clinic in 3 months for follow-up or sooner if symptoms persist or worsen.

## 2013-10-03 NOTE — Progress Notes (Signed)
   Subjective:    Patient ID: [name], male    DOB: Jul 07, 1950, 63 y.o.   MRN: [id number]  HPI Comments: [name] is a 63 year old male with PMH of CVA, SA node dysfunction s/p PPM, HTN, HLD, migraine and GERD who presents for follow-up of BP.  I saw him 2 weeks ago for c/o dizziness, fatigue, blurry vision, nausea in the setting of taking too much BB.  His symptoms have improved now that he is taking the correct dose of metoprolol (12.5mg  BID).  Still with c/o occasional nausea and sometimes burning in the stomach.    He denies loss of appetite, reflux symptoms, vomiting, diarrhea, weight loss or bloody or dark stools.  He takes daily 325mg  ASA and meloxicam nightly x 9 months for low back pain.   He has taken Zantac in the past for GERD but is not currently on H2 blocker or PPI.       Review of Systems  Constitutional: Negative for fever, chills and appetite change.  Eyes: Negative for visual disturbance.  Respiratory: Negative for shortness of breath.   Cardiovascular: Negative for chest pain, palpitations and leg swelling.  Gastrointestinal: Positive for nausea. Negative for vomiting, abdominal pain, diarrhea, constipation and blood in stool.  Genitourinary: Negative for dysuria and frequency.  Musculoskeletal: Positive for back pain.       Chronic low back pain.  Neurological: Negative for dizziness, syncope and light-headedness.       Objective:   Physical Exam  Vitals reviewed. Constitutional: He is oriented to person, place, and time. He appears well-developed. No distress.  HENT:  Head: Normocephalic and atraumatic.  Mouth/Throat: Oropharynx is clear and moist. No oropharyngeal exudate.  Eyes: EOM are normal. Pupils are equal, round, and reactive to light.  Cardiovascular: Normal rate, regular rhythm and normal heart sounds.  Exam reveals no gallop and no friction rub.   No murmur heard. Pulmonary/Chest: Effort normal and breath sounds normal. No respiratory  distress. He has no wheezes. He has no rales.  Abdominal: Soft. Bowel sounds are normal. He exhibits no distension. There is no tenderness.  Musculoskeletal: Normal range of motion. He exhibits no edema and no tenderness.  Neurological: He is alert and oriented to person, place, and time. No cranial nerve deficit. Coordination normal.  Skin: Skin is warm. He is not diaphoretic.  Psychiatric: He has a normal mood and affect. His behavior is normal.          Assessment & Plan:  Please see problem based assessment and plan.

## 2013-10-04 NOTE — Progress Notes (Signed)
Case discussed with Dr. Wilson at the time of the visit.  We reviewed the resident's history and exam and pertinent patient test results.  I agree with the assessment, diagnosis, and plan of care documented in the resident's note. 

## 2013-10-26 ENCOUNTER — Ambulatory Visit (INDEPENDENT_AMBULATORY_CARE_PROVIDER_SITE_OTHER): Payer: Medicare Other | Admitting: Internal Medicine

## 2013-10-26 ENCOUNTER — Encounter: Payer: Self-pay | Admitting: Internal Medicine

## 2013-10-26 VITALS — BP 110/80 | HR 86 | Temp 97.4°F | Ht 73.0 in | Wt 165.7 lb

## 2013-10-26 DIAGNOSIS — Z791 Long term (current) use of non-steroidal anti-inflammatories (NSAID): Secondary | ICD-10-CM | POA: Diagnosis not present

## 2013-10-26 DIAGNOSIS — I10 Essential (primary) hypertension: Secondary | ICD-10-CM | POA: Diagnosis not present

## 2013-10-26 DIAGNOSIS — F17200 Nicotine dependence, unspecified, uncomplicated: Secondary | ICD-10-CM | POA: Diagnosis not present

## 2013-10-26 DIAGNOSIS — R51 Headache: Secondary | ICD-10-CM | POA: Diagnosis not present

## 2013-10-26 DIAGNOSIS — E785 Hyperlipidemia, unspecified: Secondary | ICD-10-CM | POA: Diagnosis not present

## 2013-10-26 DIAGNOSIS — Z720 Tobacco use: Secondary | ICD-10-CM

## 2013-10-26 DIAGNOSIS — R110 Nausea: Secondary | ICD-10-CM | POA: Diagnosis not present

## 2013-10-26 DIAGNOSIS — N529 Male erectile dysfunction, unspecified: Secondary | ICD-10-CM | POA: Diagnosis not present

## 2013-10-26 DIAGNOSIS — K130 Diseases of lips: Secondary | ICD-10-CM | POA: Diagnosis not present

## 2013-10-26 DIAGNOSIS — H539 Unspecified visual disturbance: Secondary | ICD-10-CM | POA: Diagnosis not present

## 2013-10-26 MED ORDER — METOPROLOL TARTRATE 25 MG PO TABS: 12.5000 mg | ORAL_TABLET | Freq: Two times a day (BID) | ORAL | Status: DC

## 2013-10-26 MED ORDER — SILDENAFIL CITRATE 50 MG PO TABS: 50.0000 mg | ORAL_TABLET | ORAL | Status: DC | PRN

## 2013-10-26 MED ORDER — SIMVASTATIN 20 MG PO TABS: 20.0000 mg | ORAL_TABLET | Freq: Every day | ORAL | Status: DC

## 2013-10-26 NOTE — Assessment & Plan Note (Signed)
  Assessment: Progress toward smoking cessation:  smoking the same amount Barriers to progress toward smoking cessation:  withdrawal symptoms Comments: Pt not ready to quit.  Plan: Instruction/counseling given:  I counseled patient on the dangers of tobacco use, advised patient to stop smoking, and reviewed strategies to maximize success.

## 2013-10-26 NOTE — Assessment & Plan Note (Signed)
Pt continues to request Viagra for his erectile dysfunction, which he has been on in the past. I discussed with him at our last visit that if his blood pressure remained stable and did not require further medication changes, I would prescribe the medication for him. His blood pressure looks good today and the patient states that he is feeling great. I discussed the potential side effects of Viagra with the patient, including hypotension. He acknowledged understanding.  - Viagra 50mg  po PRN for erectile dysfunction.

## 2013-10-26 NOTE — Assessment & Plan Note (Signed)
BP Readings from Last 3 Encounters:  10/26/13 110/80  10/03/13 125/88  09/25/13 136/91    Lab Results  Component Value Date   NA 140 10/03/2013   K 3.7 10/03/2013   CREATININE 0.89 10/03/2013    Assessment: Blood pressure control: controlled Progress toward BP goal:  at goal Comments: BP elevated initially, but excellent on repeat.   Plan: Medications:  continue current medications: Lopressor 12.5 mg BID, Amlodipine 10mg  daily Educational resources provided:   Self management tools provided:   Other plans: F/u in 6 mo

## 2013-10-26 NOTE — Patient Instructions (Addendum)
General Instructions:   Please bring your medicines with you each time you come to clinic.  Medicines may include prescription medications, over-the-counter medications, herbal remedies, eye drops, vitamins, or other pills.   Progress Toward Treatment Goals:  Treatment Goal 10/26/2013  Blood pressure at goal  Stop smoking smoking the same amount    Self Care Goals & Plans:  Self Care Goal 10/26/2013  Manage my medications -  Monitor my health -  Eat healthy foods -  Be physically active -  Stop smoking go to the [employer] ([email or web address]-booker.info/)    No flowsheet data found.   Care Management & Community Referrals:  Referral 10/25/2012  Referrals made for care management support -  Referrals made to community resources smoking cessation       Sildenafil tablets (Viagra) What is this medicine? SILDENAFIL (sil DEN a fil) is used to treat erection problems in men. This medicine may be used for other purposes; ask your health care provider or pharmacist if you have questions. COMMON BRAND NAME(S): Viagra What should I tell my health care provider before I take this medicine? They need to know if you have any of these conditions: -bleeding disorders -eye or vision problems, including a rare inherited eye disease called retinitis pigmentosa -anatomical deformation of the penis, Peyronie's disease, or history of priapism (painful and prolonged erection) -heart disease, angina, a history of heart attack, irregular heart beats, or other heart problems -high or low blood pressure -history of blood diseases, like sickle cell anemia or leukemia -history of stomach bleeding -kidney disease -liver disease -stroke -an unusual or allergic reaction to sildenafil, other medicines, foods, dyes, or preservatives -pregnant or trying to get pregnant -breast-feeding How should I use this medicine? Take this medicine by mouth with a glass of water. Follow the directions on the  prescription label. The dose is usually taken 1 hour before sexual activity. You should not take the dose more than once per day. Do not take your medicine more often than directed. Talk to your pediatrician regarding the use of this medicine in children. This medicine is not used in children for this condition. Overdosage: If you think you have taken too much of this medicine contact a poison control center or emergency room at once. NOTE: This medicine is only for you. Do not share this medicine with others. What if I miss a dose? This does not apply. Do not take double or extra doses. What may interact with this medicine? Do not take this medicine with any of the following medications: -cisapride -methscopolamine nitrate -nitrates like amyl nitrite, isosorbide dinitrate, isosorbide mononitrate, nitroglycerin -nitroprusside -other medicines for erectile dysfunction like avanafil, tadalafil, vardenafil -other sildenafil products (Revatio) This medicine may also interact with the following medications: -certain drugs for high blood pressure -certain drugs for the treatment of HIV infection or AIDS -certain drugs used for fungal or yeast infections, like fluconazole, itraconazole, ketoconazole, and voriconazole -cimetidine -erythromycin -rifampin This list may not describe all possible interactions. Give your health care provider a list of all the medicines, herbs, non-prescription drugs, or dietary supplements you use. Also tell them if you smoke, drink alcohol, or use illegal drugs. Some items may interact with your medicine. What should I watch for while using this medicine? If you notice any changes in your vision while taking this drug, call your doctor or health care professional as soon as possible. Stop using this medicine and call your health care provider right away if you have a  loss of sight in one or both eyes. Contact your doctor or health care professional right away if you have  an erection that lasts longer than 4 hours or if it becomes painful. This may be a sign of a serious problem and must be treated right away to prevent permanent damage. If you experience symptoms of nausea, dizziness, chest pain or arm pain upon initiation of sexual activity after taking this medicine, you should refrain from further activity and call your doctor or health care professional as soon as possible. Do not drink alcohol to excess (examples, 5 glasses of wine or 5 shots of whiskey) when taking this medicine. When taken in excess, alcohol can increase your chances of getting a headache or getting dizzy, increasing your heart rate or lowering your blood pressure. Using this medicine does not protect you or your partner against HIV infection (the virus that causes AIDS) or other sexually transmitted diseases. What side effects may I notice from receiving this medicine? Side effects that you should report to your doctor or health care professional as soon as possible: -allergic reactions like skin rash, itching or hives, swelling of the face, lips, or tongue -breathing problems -changes in hearing -changes in vision -chest pain -fast, irregular heartbeat -prolonged or painful erection -seizures Side effects that usually do not require medical attention (report to your doctor or health care professional if they continue or are bothersome): -back pain -dizziness -flushing -headache -indigestion -muscle aches -nausea -stuffy or runny nose This list may not describe all possible side effects. Call your doctor for medical advice about side effects. You may report side effects to FDA at 1-800-FDA-1088. Where should I keep my medicine? Keep out of reach of children. Store at room temperature between 15 and 30 degrees C (59 and 86 degrees F). Throw away any unused medicine after the expiration date. NOTE: This sheet is a summary. It may not cover all possible information. If you have  questions about this medicine, talk to your doctor, pharmacist, or health care provider.  2015, [employer]. (2012-02-24 12:43:54)

## 2013-10-26 NOTE — Assessment & Plan Note (Addendum)
Refilled simvastatin today. Pt needs lipid panel rechecked- last lipid panel was 10/2012. Will repeat at next visit.

## 2013-10-26 NOTE — Progress Notes (Signed)
Patient ID: [name], male   DOB: 1950/03/27, 63 y.o.   MRN: [id number]  Subjective:   Patient ID: [name] male   DOB: Feb 08, 1951 63 y.o.   MRN: [id number]  HPI: [name] is a 63 y.o. M w/ PMH remote CVA, SA node dysfunction s/p pacemaker placement, HLD, tobacco abuse, and HTN presents for a routine follow up for his HTN and for medication refills.   He was seen 7/20 for HTN and nausea. The nausea was thought to be 2/2 NSAID gastropathy. He denies bloody or dark stools. His meloxicam was decreased and he was encouraged to try Tylenol PRN for his lower back pain. Labs were checked to assess renal function and were normal.   Pt requesting a prescription for Viagra for erectile dysfunction, which he has been on and tolerated well in the past. At his last clinic visit, we did discuss starting Viagra if his blood pressure remained stable.    Past Medical History  Diagnosis Date  . Tuberculosis at age 24-3  . Sickle cell trait   . Stroke 10/03/08    Right thalamus  . Hypertension     goal < 140/90  . Emphysema     per multiple CXRs  . Snoring     Sleep study 09/02/10 was WNL  . CEREBROVASCULAR ACCIDENT 10/17/2008    Acute CVA of right thalamus 10/03/08 ECHO performed 2/2 CVA 10/09/08, EF 55-65%  Aspirin 325 mg daily and try to work on quitting smoking.    Current Outpatient Prescriptions  Medication Sig Dispense Refill  . amLODipine (NORVASC) 10 MG tablet Take 1 tablet (10 mg total) by mouth daily.  30 tablet  11  . aspirin 325 MG tablet Take 325 mg by mouth every morning.      . metoprolol tartrate (LOPRESSOR) 25 MG tablet Take 0.5 tablets (12.5 mg total) by mouth 2 (two) times daily.  60 tablet  6  . simvastatin (ZOCOR) 20 MG tablet Take 1 tablet (20 mg total) by mouth daily.  30 tablet  11  . meloxicam (MOBIC) 15 MG tablet TAKE ONE TABLET BY MOUTH EVERY DAY WITH FOOD FOR BACK PAIN  30 tablet  3  . sildenafil (VIAGRA) 50 MG tablet Take 1 tablet (50 mg total) by  mouth as needed for erectile dysfunction.  20 tablet  1   No current facility-administered medications for this visit.   Family History  Problem Relation Age of Onset  . Cancer Sister   . Sickle cell anemia Son    History   Social History  . Marital Status: Married    Spouse Name: N/A    Number of Children: N/A  . Years of Education: N/A   Occupational History  . unemployed    Social History Main Topics  . Smoking status: Current Every Day Smoker -- 0.30 packs/day for 10 years    Types: Cigarettes  . Smokeless tobacco: None     Comment: Cutting back.  . Alcohol Use: No  . Drug Use: No     Comment: previous polysubstance abuser, quit x 13 years  . Sexual Activity: None   Other Topics Concern  . None   Social History Narrative   Lives in [place].  Works as a Pharmacologist for [employer]   Review of Systems: Constitutional: Denies fever, chills, diaphoresis, appetite change and fatigue.  HEENT: +sinus congetsion and pain in the eustachian tubes. Denies vision changes or blurry vision. Respiratory: Denies SOB, DOE,  cough, chest tightness, and wheezing.   Cardiovascular: Denies chest pain, palpitations and leg swelling.  Gastrointestinal: Denies nausea, vomiting, abdominal pain, diarrhea, constipation, blood in stool and abdominal distention.  Genitourinary: Denies dysuria, urgency, frequency, hematuria, flank pain and difficulty urinating.  Musculoskeletal: Denies myalgias, back pain, joint swelling, arthralgias, or gait problem.  Skin: Denies rash and wound.  Neurological: Denies dizziness, weakness, light-headedness, numbness and headaches.  Psychiatric/Behavioral: Denies suicidal ideation, mood changes, confusion  Objective:  Physical Exam: Filed Vitals:   10/26/13 1352 10/26/13 1422  BP: 140/97 110/80  Pulse: 86 86  Temp: 97.4 F (36.3 C)   TempSrc: Oral   Height: 6\' 1"  (1.854 m)   Weight: 165 lb 11.2 oz (75.161 kg)   SpO2: 98%     Constitutional: Vital signs reviewed.  Patient is a well-developed and well-nourished male in no acute distress and cooperative with exam. Alert and oriented x3.  Head: Normocephalic and atraumatic Eyes: PERRL, EOMI Neck: Supple, Trachea midline, normal ROM, No JVD, mass, thyromegaly Cardiovascular: RRR, no MRG, pulses symmetric and intact bilaterally Pulmonary/Chest: Normal respiratory effort, CTAB, no wheezes, rales, or rhonchi Abdominal: Soft. Non-tender, non-distended, bowel sounds are normal, no masses, organomegaly, or guarding present.  Musculoskeletal: No joint deformities, erythema, or stiffness Hematology: No cervical adenopathy.  Neurological: A&O x3, Strength is normal and symmetric bilaterally, cranial nerve II-XII are grossly intact, no focal motor deficit, sensory intact to light touch bilaterally.  Skin: Warm, dry and intact. No rash.  Psychiatric: Normal mood and affect. Speech and behavior is normal.   Assessment & Plan:   Please refer to Problem List based Assessment and Plan

## 2013-10-27 NOTE — Progress Notes (Signed)
Case discussed with Dr. Eulas Post soon after the resident saw the patient.  We reviewed the resident's history and exam and pertinent patient test results.  I agree with the assessment, diagnosis and plan of care documented in the resident's note.

## 2013-11-02 ENCOUNTER — Encounter: Payer: Self-pay | Admitting: Internal Medicine

## 2013-11-27 ENCOUNTER — Telehealth: Payer: Self-pay | Admitting: Internal Medicine

## 2013-11-27 NOTE — Telephone Encounter (Signed)
Spoke with patient and he says he may be catching cold.  I have asked him to contact his PCP to discuss and call me back if they feel he needs to see Cardiology

## 2013-11-27 NOTE — Telephone Encounter (Signed)
New Message  Pt called that he has pains around his pacer from time to time and when he breathes he has pain as well. This is not everyday, but it is something he is alarmed about. Please call back to discuss.

## 2013-12-11 ENCOUNTER — Telehealth: Payer: Self-pay | Admitting: *Deleted

## 2013-12-11 NOTE — Telephone Encounter (Signed)
Received PA request from pt's pharmacy for his Viagra.  Will place form in pcp's  Box for completion.Regenia Skeeter, Azaylia Fong Cassady10/5/201511:12 AM +

## 2013-12-21 ENCOUNTER — Ambulatory Visit (INDEPENDENT_AMBULATORY_CARE_PROVIDER_SITE_OTHER): Payer: Medicare Other | Admitting: *Deleted

## 2013-12-21 DIAGNOSIS — Z23 Encounter for immunization: Secondary | ICD-10-CM

## 2014-01-12 ENCOUNTER — Encounter: Payer: Self-pay | Admitting: Internal Medicine

## 2014-02-27 ENCOUNTER — Encounter: Payer: Self-pay | Admitting: Internal Medicine

## 2014-02-27 ENCOUNTER — Ambulatory Visit (INDEPENDENT_AMBULATORY_CARE_PROVIDER_SITE_OTHER): Payer: Medicare Other | Admitting: Internal Medicine

## 2014-02-27 VITALS — BP 150/109 | HR 72 | Temp 98.0°F | Wt 170.4 lb

## 2014-02-27 DIAGNOSIS — J988 Other specified respiratory disorders: Principal | ICD-10-CM

## 2014-02-27 DIAGNOSIS — B9789 Other viral agents as the cause of diseases classified elsewhere: Secondary | ICD-10-CM

## 2014-02-27 DIAGNOSIS — I10 Essential (primary) hypertension: Secondary | ICD-10-CM

## 2014-02-27 DIAGNOSIS — J069 Acute upper respiratory infection, unspecified: Secondary | ICD-10-CM | POA: Insufficient documentation

## 2014-02-27 DIAGNOSIS — Z7982 Long term (current) use of aspirin: Secondary | ICD-10-CM | POA: Diagnosis not present

## 2014-02-27 MED ORDER — ALBUTEROL SULFATE HFA 108 (90 BASE) MCG/ACT IN AERS: 2.0000 | INHALATION_SPRAY | Freq: Four times a day (QID) | RESPIRATORY_TRACT | Status: DC | PRN

## 2014-02-27 MED ORDER — HYDROCOD POLST-CHLORPHEN POLST 10-8 MG/5ML PO LQCR: 5.0000 mL | Freq: Two times a day (BID) | ORAL | Status: DC | PRN

## 2014-02-27 MED ORDER — AMLODIPINE BESYLATE 10 MG PO TABS: 10.0000 mg | ORAL_TABLET | Freq: Every day | ORAL | Status: DC

## 2014-02-27 NOTE — Assessment & Plan Note (Addendum)
Sore throat and dry cough x1 week. Sore throat has resolved but cough still present. Denies fevers or chills and cough occurs primarily when he gets too warm and resolves once he cools down. Pt continues to smoke. He has an old Albuterol inhaler that he states is expired and needs a new one. - Albuterol inhaler 2p q4-6h PRN cough, shortness of breath, or wheezing. - Tussionex BID for cough - Smoking cessation encouraged.  - Pt advised to call the clinic or go to the ED if his breathing worsens, becomes productive with green sputum, he develops a fever, chills, or malaise.

## 2014-02-27 NOTE — Patient Instructions (Signed)
You can use the inhaler 2 puffs every 4 to 6 hours as needed for your cough. You can take Tussinox cough syrup 2 times a day- this has a narcotic in it, so do not take it if you are driving or caring from small children by yourself.   Start taking Amlodipine 10mg  daily for your blood pressure.  STOP smoking   Chlorpheniramine; Hydrocodone oral solution or suspension What is this medicine? CHLORPHENIRAMINE; HYDROCODONE (klor fen IR a meen; hye droe KOE done) is a combination of an antihistamine and cough suppressant. It is used to treat the symptoms of allergies and colds. This medicine may be used for other purposes; ask your health care provider or pharmacist if you have questions. COMMON BRAND NAME(S): HyTan, Novasus, S-T Forte 2, Tussionex, VITUZ What should I tell my health care provider before I take this medicine? They need to know if you have any of these conditions: -diabetes -head injury -intestine problems -kidney disease -liver disease -other chronic illness -trouble breathing -an unusual or allergic reaction to chlorpheniramine, codeine, hydrocodone, other pain medicines, foods, dyes or preservatives -pregnant or trying to get pregnant -breast-feeding How should I use this medicine? Take this medicine by mouth with a glass of water. Follow the directions on the prescription label. Shake well before using. Do not mix your dose with other fluids or medicines before you take it. Use a specially marked spoon or container to measure your medicine. Household spoons are not accurate. Take your doses at regular intervals. Do not take your medicine more often than directed. Talk to your pediatrician regarding the use of this medicine in children. This medicine is not approved for use in children less than 26 years old. Overdosage: If you think you have taken too much of this medicine contact a poison control center or emergency room at once. NOTE: This medicine is only for you. Do not  share this medicine with others. What if I miss a dose? If you miss a dose, take it as soon as you can. If it is almost time for your next dose, take only that dose. Do not take double or extra doses. What may interact with this medicine? Do not take this medicine with any of the following medications: -alcohol This medicine may also interact with the following medications: -antihistamines for cold or allergy -medicines for depression or other mental disturbances -muscle relaxants -narcotic medicines (opiates) for pain -some medicines for anxiety or sleep -some medicines for Parkinson's disease -some medicines for stomach problems -tramadol This list may not describe all possible interactions. Give your health care provider a list of all the medicines, herbs, non-prescription drugs, or dietary supplements you use. Also tell them if you smoke, drink alcohol, or use illegal drugs. Some items may interact with your medicine. What should I watch for while using this medicine? Tell your doctor or health care professional if your symptoms do not improve or if they get worse. If you also have a high fever, skin rash, continuing headache, or sore throat, see your doctor. You may develop tolerance to this medicine if you take it for a long time. Tolerance means that you will get less cough relief with time. Do not suddenly stop taking your medicine because you may develop a severe reaction. Your body becomes used to the medicine. This does NOT mean you are addicted. Addiction is a behavior related to getting and using a drug for a non-medical reason. If your doctor wants you to stop the medicine,  the dose will be slowly lowered over time to avoid any side effects. You may get drowsy or dizzy. Do not drive, use machinery, or do anything that needs mental alertness until you know how this medicine affects you. Do not stand or sit up quickly, especially if you are an older patient. This reduces the risk of  dizzy or fainting spells. Alcohol may interfere with the effect of this medicine. Avoid alcoholic drinks. The medicine will cause constipation. Try to have a bowel movement at least every 2 to 3 days. If you do not have a bowel movement for 3 days, call your doctor or health care professional. This medicine may cause dry eyes and blurred vision. If you wear contact lenses you may feel some discomfort. Lubricating drops may help. See your eye doctor if the problem does not go away or is severe. What side effects may I notice from receiving this medicine? Side effects that you should report to your doctor or health care professional as soon as possible: -allergic reactions like skin rash, itching or hives, swelling of the face, lips, or tongue -breathing problems -changes in vision -chest tightness -cold, clammy skin -confusion, anxiety, fear -trouble passing urine or change in the amount of urine -unusually slow heartbeat -unusually weak or tired Side effects that usually do not require medical attention (report to your doctor or health care professional if they continue or are bothersome): -constipation -dry throat -nausea, vomiting -stomach upset This list may not describe all possible side effects. Call your doctor for medical advice about side effects. You may report side effects to FDA at 1-800-FDA-1088. Where should I keep my medicine? Keep out of the reach of children. This medicine can be abused. Keep this medicine in a safe place to protect it from theft. Do not share this medicine with anyone. Selling or giving away this medicine is dangerous and is against the law. Store at room temperature between 15 and 30 degrees C (59 and 86 degrees F). Do not freeze. Keep container tightly closed. Throw away any unused medicine after the expiration date. Discard unused medicine and used packaging carefully. Pets and children can be harmed if they find used or lost packages. NOTE: This sheet is  a summary. It may not cover all possible information. If you have questions about this medicine, talk to your doctor, pharmacist, or health care provider.  2015, Elsevier/Gold Standard. (2010-11-04 11:12:39)    General Instructions:   Please bring your medicines with you each time you come to clinic.  Medicines may include prescription medications, over-the-counter medications, herbal remedies, eye drops, vitamins, or other pills.   Progress Toward Treatment Goals:  Treatment Goal 02/27/2014  Blood pressure deteriorated  Stop smoking smoking the same amount    Self Care Goals & Plans:  Self Care Goal 10/26/2013  Manage my medications -  Monitor my health -  Eat healthy foods -  Be physically active -  Stop smoking go to the [employer] (https://scott-booker.info/)    No flowsheet data found.   Care Management & Community Referrals:  Referral 10/25/2012  Referrals made for care management support -  Referrals made to community resources smoking cessation

## 2014-02-27 NOTE — Progress Notes (Signed)
Patient ID: [name], male   DOB: [birth year], 63 y.o.   MRN: [id number]  Subjective:   Patient ID: [name] male   DOB: Jan 23, 1951 63 y.o.   MRN: [id number]  HPI: [name] is a 63 y.o. M w/ PMH remote CVA, SA node dysfunction s/p pacemaker placement, HLD, tobacco abuse, and HTN presents for a sickvisit.   He c/o a cold with a sore throat. The sore throat has resolved but he has been left wth a dry cough. He is having a dry cough at night that occurs when he gets hot and resolves after he cools off, approx 56min. His symptoms began 1 week ago. He denies fevers or chills.   He is smoking 3-4 cig/day.   He states that he is not taking his lopressor or amlodipine or simvastatin.     Past Medical History  Diagnosis Date  . Tuberculosis at age 63-3  . Sickle cell trait   . Stroke 10/03/08    Right thalamus  . Hypertension     goal < 140/90  . Emphysema     per multiple CXRs  . Snoring     Sleep study 09/02/10 was WNL  . CEREBROVASCULAR ACCIDENT 10/17/2008    Acute CVA of right thalamus 10/03/08 ECHO performed 2/2 CVA 10/09/08, EF 55-65%  Aspirin 325 mg daily and try to work on quitting smoking.    Current Outpatient Prescriptions  Medication Sig Dispense Refill  . amLODipine (NORVASC) 10 MG tablet Take 1 tablet (10 mg total) by mouth daily. 30 tablet 11  . aspirin 325 MG tablet Take 325 mg by mouth every morning.    . meloxicam (MOBIC) 15 MG tablet TAKE ONE TABLET BY MOUTH EVERY DAY WITH FOOD FOR BACK PAIN 30 tablet 3  . metoprolol tartrate (LOPRESSOR) 25 MG tablet Take 0.5 tablets (12.5 mg total) by mouth 2 (two) times daily. 60 tablet 6  . sildenafil (VIAGRA) 50 MG tablet Take 1 tablet (50 mg total) by mouth as needed for erectile dysfunction. 20 tablet 1  . simvastatin (ZOCOR) 20 MG tablet Take 1 tablet (20 mg total) by mouth daily. 30 tablet 11   No current facility-administered medications for this visit.   Family History  Problem Relation Age of Onset   . Cancer Sister   . Sickle cell anemia Son    History   Social History  . Marital Status: Married    Spouse Name: N/A    Number of Children: N/A  . Years of Education: N/A   Occupational History  . unemployed    Social History Main Topics  . Smoking status: Current Every Day Smoker -- 0.30 packs/day for 10 years    Types: Cigarettes  . Smokeless tobacco: None     Comment: Cutting back.  . Alcohol Use: No  . Drug Use: No     Comment: previous polysubstance abuser, quit x 13 years  . Sexual Activity: None   Other Topics Concern  . None   Social History Narrative   Lives in [place].  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 +dry cough.   Objective:  Physical Exam: Filed Vitals:   02/27/14 1456  BP: 152/104  Pulse: 83  Temp: 98 F (36.7 C)  TempSrc: Oral  Weight: 170 lb 6.4 oz (77.293 kg)  SpO2: 100%   Constitutional: Vital signs reviewed.  Patient is a well-developed and well-nourished male in no acute distress and cooperative with exam. Alert and oriented x3.  Head: Normocephalic and atraumatic Eyes: PERRL, EOMI Neck: Supple, trachea midline, normal ROM, No JVD Cardiovascular: RRR, no MRG Pulmonary/Chest: Normal respiratory effort, CTAB, no wheezes, rales, or rhonchi Abdominal: Soft. Non-tender, non-distended, bowel sounds are normal, no guarding present.  Musculoskeletal: No joint deformities, erythema, ROM full Neurological: A&O x3, Strength is normal and symmetric bilaterally, cranial nerve II-XII are grossly intact, no focal motor deficit.  Skin: Warm, dry and intact.   Psychiatric: Normal mood and affect.  Speech and behavior are normal.   Assessment & Plan:   Please refer to Problem List based Assessment and Plan

## 2014-02-27 NOTE — Assessment & Plan Note (Signed)
BP Readings from Last 3 Encounters:  02/27/14 150/109  10/26/13 110/80  10/03/13 125/88    Lab Results  Component Value Date   NA 140 10/03/2013   K 3.7 10/03/2013   CREATININE 0.89 10/03/2013    Assessment: Blood pressure control: moderately elevated Progress toward BP goal:  deteriorated Comments: Pt not taking his BP meds, stating "I don't think I need them."  Plan: Medications:  Restarting Amlodipine 10mg  daily Other plans: F/u in 1 mo

## 2014-02-28 NOTE — Progress Notes (Signed)
Case discussed with Dr. Glenn soon after the resident saw the patient.  We reviewed the resident's history and exam and pertinent patient test results.  I agree with the assessment, diagnosis, and plan of care documented in the resident's note. 

## 2014-03-20 ENCOUNTER — Telehealth: Payer: Self-pay | Admitting: *Deleted

## 2014-03-20 DIAGNOSIS — N529 Male erectile dysfunction, unspecified: Secondary | ICD-10-CM

## 2014-03-20 NOTE — Telephone Encounter (Signed)
Pt would like a written Rx for Cialis not Viagra. Will call pt when Rx is ready. Hilda Blades Lizanne Erker RN 03/20/14 4:30PM

## 2014-03-21 MED ORDER — TADALAFIL 10 MG PO TABS: ORAL_TABLET | ORAL | Status: DC

## 2014-03-21 NOTE — Assessment & Plan Note (Signed)
Pt requesting to try Cialis instead of Viagra due to the cost. Will provide new prescription for Cialis 10mg  to be taken at least 30 minutes prior to anticipated sexual activity as one single dose and not more than once daily.

## 2014-04-18 ENCOUNTER — Telehealth: Payer: Self-pay | Admitting: Internal Medicine

## 2014-04-18 NOTE — Telephone Encounter (Signed)
2/10 left vm to sched pacer check w/ device clinic; ah

## 2014-05-21 ENCOUNTER — Encounter: Payer: Medicare Other | Admitting: Internal Medicine

## 2014-05-21 ENCOUNTER — Encounter: Payer: Self-pay | Admitting: Internal Medicine

## 2014-06-11 ENCOUNTER — Ambulatory Visit: Payer: Medicare Other | Admitting: Internal Medicine

## 2014-06-14 ENCOUNTER — Ambulatory Visit (INDEPENDENT_AMBULATORY_CARE_PROVIDER_SITE_OTHER): Payer: Medicare Other | Admitting: Internal Medicine

## 2014-06-14 ENCOUNTER — Other Ambulatory Visit (HOSPITAL_COMMUNITY)
Admission: RE | Admit: 2014-06-14 | Discharge: 2014-06-14 | Disposition: A | Discharge status: Home / Self Care | Payer: Medicare Other | Source: Ambulatory Visit | Attending: Internal Medicine | Admitting: Internal Medicine

## 2014-06-14 ENCOUNTER — Encounter: Payer: Self-pay | Admitting: Internal Medicine

## 2014-06-14 VITALS — BP 180/118 | HR 83 | Temp 97.8°F | Ht 73.0 in | Wt 172.0 lb

## 2014-06-14 DIAGNOSIS — J302 Other seasonal allergic rhinitis: Secondary | ICD-10-CM | POA: Diagnosis not present

## 2014-06-14 DIAGNOSIS — Z113 Encounter for screening for infections with a predominantly sexual mode of transmission: Secondary | ICD-10-CM | POA: Insufficient documentation

## 2014-06-14 DIAGNOSIS — Z114 Encounter for screening for human immunodeficiency virus [HIV]: Secondary | ICD-10-CM

## 2014-06-14 DIAGNOSIS — I10 Essential (primary) hypertension: Secondary | ICD-10-CM | POA: Diagnosis not present

## 2014-06-14 DIAGNOSIS — Z202 Contact with and (suspected) exposure to infections with a predominantly sexual mode of transmission: Secondary | ICD-10-CM | POA: Diagnosis present

## 2014-06-14 DIAGNOSIS — M545 Low back pain: Secondary | ICD-10-CM | POA: Diagnosis not present

## 2014-06-14 MED ORDER — METOPROLOL SUCCINATE ER 25 MG PO TB24: 25.0000 mg | ORAL_TABLET | Freq: Every day | ORAL | Status: DC

## 2014-06-14 MED ORDER — MELOXICAM 7.5 MG PO TABS: 7.5000 mg | ORAL_TABLET | Freq: Every day | ORAL | Status: DC | PRN

## 2014-06-14 NOTE — Patient Instructions (Signed)
1. Please pick up new prescription for metoprolol XL 25mg  daily.  This medication is long acting so you only take it once per day.  Please keep taking your amlodipine.  I will call you if there are problems with your labs.   2. Please take all medications as prescribed.    3. If you have worsening of your symptoms or new symptoms arise, please call the clinic ([phone]), or go to the ER immediately if symptoms are severe.

## 2014-06-14 NOTE — Assessment & Plan Note (Addendum)
BP Readings from Last 3 Encounters:  06/14/14 180/118  02/27/14 150/109  10/26/13 110/80    Lab Results  Component Value Date   NA 140 10/03/2013   K 3.7 10/03/2013   CREATININE 0.89 10/03/2013    Assessment: Blood pressure control:  elevated Progress toward BP goal:   deteriorated Comments: Patient says he does not like to take pills.  He says he is taking his amlodipine but misses it about 3 days per week and has not yet taken today's dose.  He is agreeable to resuming metoprolol.  He says he did not have adverse effects with it.  He has contraindications to ACEI (allergy) and HCTZ (made him feel crazy).  Plan: Medications:  continue current medications:  Amlodipine 10mg  daily.  RESUME metoprolol but use XL due to compliance issues.  Will start Toprol XL 25mg . Educational resources provided: brochure (denies)  Self management tools:  He was advised to put his pills next to the coffee pot (he drinks coffee every morning) so he does not forget to take them.  He agrees to try this strategy. Other plans: RTC in 1 month.

## 2014-06-14 NOTE — Progress Notes (Signed)
   Subjective:    Patient ID: [name], male    DOB: May 21, 1950, 64 y.o.   MRN: [id number]  HPI Comments: Mr. Voiles is a 64 year old male with PMH as below here for allergy symptoms.  Please see problem based assessment and plan for details.     Past Medical History  Diagnosis Date  . Tuberculosis at age 59-3  . Sickle cell trait   . Stroke 10/03/08    Right thalamus  . Hypertension     goal < 140/90  . Emphysema     per multiple CXRs  . Snoring     Sleep study 09/02/10 was WNL  . CEREBROVASCULAR ACCIDENT 10/17/2008    Acute CVA of right thalamus 10/03/08 ECHO performed 2/2 CVA 10/09/08, EF 55-65%  Aspirin 325 mg daily and try to work on quitting smoking.     Review of Systems  Constitutional: Negative for fever and appetite change.  HENT: Positive for postnasal drip and rhinorrhea. Negative for hearing loss.   Eyes: Negative for visual disturbance.  Cardiovascular: Negative for chest pain, palpitations and leg swelling.  Gastrointestinal: Negative for nausea, vomiting, diarrhea and constipation.  Genitourinary: Negative for dysuria and hematuria.  Neurological: Negative for syncope and light-headedness.       Filed Vitals:   06/14/14 1441  BP: 180/118  Pulse: 83  Temp: 97.8 F (36.6 C)  TempSrc: Oral  Height: 6\' 1"  (1.854 m)  Weight: 172 lb (78.019 kg)  SpO2: 100%    Objective:   Physical Exam  Constitutional: He is oriented to person, place, and time. He appears well-developed. No distress.  He is well dressed, in a suit.  He is non-toxic appearing.  HENT:  Head: Normocephalic and atraumatic.  Mouth/Throat: Oropharynx is clear and moist. No oropharyngeal exudate.  There is no sinus tenderness.  Eyes: EOM are normal. Pupils are equal, round, and reactive to light.  Neck: Neck supple.  Cardiovascular: Normal rate and regular rhythm.  Exam reveals no gallop and no friction rub.   No murmur heard. Pulmonary/Chest: Effort normal and breath sounds normal. No  respiratory distress. He has no wheezes. He has no rales.  Abdominal: Soft. Bowel sounds are normal. He exhibits no distension and no mass. There is no tenderness. There is no rebound and no guarding.  Musculoskeletal: Normal range of motion. He exhibits no edema or tenderness.  Lymphadenopathy:    He has no cervical adenopathy.  Neurological: He is alert and oriented to person, place, and time. No cranial nerve deficit.  Strength and sensation intact and equal upper and lower extremities.  Skin: Skin is warm. He is not diaphoretic.  Psychiatric: He has a normal mood and affect. His behavior is normal.  Vitals reviewed.         Assessment & Plan:  Please see problem based assessment and plan.

## 2014-06-15 DIAGNOSIS — Z202 Contact with and (suspected) exposure to infections with a predominantly sexual mode of transmission: Secondary | ICD-10-CM | POA: Insufficient documentation

## 2014-06-15 DIAGNOSIS — J302 Other seasonal allergic rhinitis: Secondary | ICD-10-CM | POA: Insufficient documentation

## 2014-06-15 LAB — RPR

## 2014-06-15 LAB — URINE CYTOLOGY ANCILLARY ONLY
CHLAMYDIA, DNA PROBE: NEGATIVE
Neisseria Gonorrhea: NEGATIVE

## 2014-06-15 LAB — HIV ANTIBODY (ROUTINE TESTING W REFLEX): HIV 1&2 Ab, 4th Generation: NONREACTIVE

## 2014-06-15 NOTE — Assessment & Plan Note (Signed)
He is requesting a "urine test" for STD after unprotected sex four days ago.  They subsequently got into an argument and she told him that she found out she has an STD but he is not sure which one.  He mentioned Gonorrhea but is not sure.  He thinks she may have said it out of anger but he would like to be sure.  He denies genital ulcers, penile discharge, itching or dysuria.  He thinks he was treated with PCN for an STD in his younger days (?syphillis). - urine CG/CT NAAT - RPR and HIV (patient does not have record of HIV testing in [facility] and he has agreed to this additional testing) - Patient was advised that HIV ab may be negative in the immediate period after infection and that he may want to consider repeat testing in the next 6 weeks (if he has reason to believe he was exposed to HIV). - Patient was advised to look out for signs of HIV (ie acute flu-like illness, lymph node swelling in neck, armpits, groin) and he will come in immediately for evaluation if these symptoms occur. - He was advised to use condoms to decrease likelihood of STD transmission. - Patient will return on 04/11 for the results of his testing.

## 2014-06-15 NOTE — Assessment & Plan Note (Addendum)
The patient requested a refill of Mobic for chronic low back pain.  He says the pain responds to position change and Mobic also helps.  He says he only takes one pill about 3 times per week.  He does not use other NSAIDs.  - refilled Mobic at lower dose (7.5mg ) daily prn; I advised he continue to only use this medication when absolutely necessary and to try other conservative measures like heat - I advised that he continue to avoid other NSAIDs (ibuprofen, Aleve, etc) while taking Mobic and he understands and agrees - most recent renal function was normal; would recommend repeat BMP at next visit

## 2014-06-15 NOTE — Assessment & Plan Note (Addendum)
He reports recent sneezing, rhinorrhea but denies sore throat, sinus pressure/pain, fever, cough or dyspnea.  He has never had allergies in the past but he feels symptoms are allergies and he says OTC Claritin has really helped his symptoms.  This could be acute URI that is resolving vs allergy.  There is no purulent discharge or sinus tenderness to suggest sinus infection (though it would likely be viral in any case).  Given improvement with Claritin this is likely seasonal allergy.  -  Continue Claritin OTC, he also has Flonase OTC

## 2014-06-18 ENCOUNTER — Ambulatory Visit (INDEPENDENT_AMBULATORY_CARE_PROVIDER_SITE_OTHER): Payer: Medicare Other | Admitting: Internal Medicine

## 2014-06-18 ENCOUNTER — Encounter: Payer: Self-pay | Admitting: Internal Medicine

## 2014-06-18 VITALS — BP 140/80 | HR 80 | Temp 97.5°F | Ht 73.0 in | Wt 172.2 lb

## 2014-06-18 DIAGNOSIS — Z202 Contact with and (suspected) exposure to infections with a predominantly sexual mode of transmission: Secondary | ICD-10-CM | POA: Diagnosis not present

## 2014-06-18 DIAGNOSIS — I10 Essential (primary) hypertension: Secondary | ICD-10-CM

## 2014-06-18 NOTE — Patient Instructions (Signed)
1. Please come back to see Dr. Eulas Post next month or sooner if you have problems.    2. Please take all medications as prescribed.    3. If you have worsening of your symptoms or new symptoms arise, please call the clinic ([phone]), or go to the ER immediately if symptoms are severe.

## 2014-06-18 NOTE — Progress Notes (Signed)
Case discussed with Dr. Wilson soon after the resident saw the patient. We reviewed the resident's history and exam and pertinent patient test results. I agree with the assessment, diagnosis, and plan of care documented in the resident's note. 

## 2014-06-18 NOTE — Progress Notes (Signed)
Internal Medicine Clinic Attending  Case discussed with Dr. Wilson soon after the resident saw the patient.  We reviewed the resident's history and exam and pertinent patient test results.  I agree with the assessment, diagnosis, and plan of care documented in the resident's note.  

## 2014-06-18 NOTE — Assessment & Plan Note (Addendum)
HIV, RPR, CG and CT negative.  Patient was given these results.  I advised communication with his wife (they are not speaking and he is not sure what he may have been exposed to).  I re-iterated the importance of condom use to decrease (but not eliminate) STD risk.  Patient voiced understanding.

## 2014-06-18 NOTE — Assessment & Plan Note (Signed)
BP Readings from Last 3 Encounters:  06/18/14 140/80  06/14/14 180/118  02/27/14 150/109    Lab Results  Component Value Date   NA 140 10/03/2013   K 3.7 10/03/2013   CREATININE 0.89 10/03/2013    Assessment: Blood pressure control:  controlled Progress toward BP goal:   at goal Comments: Patient reports compliance with meds and has started the Toprol XL.  Plan: Medications:  continue current medications:  Amlodipine 10mg  daily and Toprol XL 25mg  daily. Other plans: RTC to see PCP in 1-2 months.

## 2014-06-18 NOTE — Progress Notes (Signed)
   Subjective:    Patient ID: [name], male    DOB: [birth year], 64 y.o.   MRN: [id number]  HPI Comments: [name] is a 64 year old male here for lab results and BP check.  Please see problem based charting for A&P.    Past Medical History  Diagnosis Date  . Tuberculosis at age 13-3  . Sickle cell trait   . Stroke 10/03/08    Right thalamus  . Hypertension     goal < 140/90  . Emphysema     per multiple CXRs  . Snoring     Sleep study 09/02/10 was WNL  . CEREBROVASCULAR ACCIDENT 10/17/2008    Acute CVA of right thalamus 10/03/08 ECHO performed 2/2 CVA 10/09/08, EF 55-65%  Aspirin 325 mg daily and try to work on quitting smoking.     Review of Systems  Constitutional: Negative for fever, chills and appetite change.  Gastrointestinal: Negative for nausea and vomiting.  Genitourinary: Negative for dysuria, discharge and genital sores.  Neurological: Negative for light-headedness.       Filed Vitals:   06/18/14 1117 06/18/14 1118  BP: 153/110 140/80  Pulse: 75 80  Temp: 97.5 F (36.4 C)   TempSrc: Oral   Height: 6\' 1"  (1.854 m)   Weight: 172 lb 3.2 oz (78.109 kg)   SpO2: 100%    Objective:   Physical Exam  Constitutional: He is oriented to person, place, and time. He appears well-developed. No distress.  HENT:  Head: Normocephalic and atraumatic.  Mouth/Throat: Oropharynx is clear and moist. No oropharyngeal exudate.  Eyes: EOM are normal. Pupils are equal, round, and reactive to light.  Neck: Neck supple.  Cardiovascular: Normal rate and regular rhythm.  Exam reveals no gallop and no friction rub.   No murmur heard. Pulmonary/Chest: Effort normal and breath sounds normal. No respiratory distress. He has no wheezes. He has no rales.  Abdominal: Soft. Bowel sounds are normal. He exhibits no distension and no mass. There is no tenderness. There is no rebound and no guarding.  Musculoskeletal: Normal range of motion. He exhibits no edema or tenderness.    Neurological: He is alert and oriented to person, place, and time. No cranial nerve deficit.  Skin: Skin is warm. He is not diaphoretic.  Psychiatric: He has a normal mood and affect. His behavior is normal.  Vitals reviewed.         Assessment & Plan:  Please see problem based charting for A&P.

## 2014-07-19 ENCOUNTER — Ambulatory Visit (INDEPENDENT_AMBULATORY_CARE_PROVIDER_SITE_OTHER): Payer: Medicare Other | Admitting: Internal Medicine

## 2014-07-19 ENCOUNTER — Encounter: Payer: Self-pay | Admitting: Internal Medicine

## 2014-07-19 VITALS — BP 144/102 | HR 85 | Temp 98.0°F | Wt 166.8 lb

## 2014-07-19 DIAGNOSIS — Z0000 Encounter for general adult medical examination without abnormal findings: Secondary | ICD-10-CM | POA: Diagnosis not present

## 2014-07-19 DIAGNOSIS — Z720 Tobacco use: Secondary | ICD-10-CM | POA: Diagnosis not present

## 2014-07-19 DIAGNOSIS — I10 Essential (primary) hypertension: Secondary | ICD-10-CM | POA: Diagnosis not present

## 2014-07-19 DIAGNOSIS — E785 Hyperlipidemia, unspecified: Secondary | ICD-10-CM | POA: Diagnosis not present

## 2014-07-19 DIAGNOSIS — Z23 Encounter for immunization: Secondary | ICD-10-CM

## 2014-07-19 DIAGNOSIS — H539 Unspecified visual disturbance: Secondary | ICD-10-CM

## 2014-07-19 MED ORDER — AMLODIPINE BESYLATE 10 MG PO TABS: 10.0000 mg | ORAL_TABLET | Freq: Every day | ORAL | Status: DC

## 2014-07-19 MED ORDER — SIMVASTATIN 20 MG PO TABS: 20.0000 mg | ORAL_TABLET | Freq: Every day | ORAL | Status: DC

## 2014-07-19 MED ORDER — ALBUTEROL SULFATE HFA 108 (90 BASE) MCG/ACT IN AERS: 2.0000 | INHALATION_SPRAY | Freq: Four times a day (QID) | RESPIRATORY_TRACT | Status: DC | PRN

## 2014-07-19 NOTE — Patient Instructions (Signed)
Stop taking the Toprol-XL (Metoprolol).  Do take the amlodipine 10mg  (1 tablet) daily.  Be sure to stay hydrated and drink plenty of water!  Come back to the clinic in 2 weeks for a blood pressure recheck.    General Instructions:   Please bring your medicines with you each time you come to clinic.  Medicines may include prescription medications, over-the-counter medications, herbal remedies, eye drops, vitamins, or other pills.   Progress Toward Treatment Goals:  Treatment Goal 02/27/2014  Blood pressure deteriorated  Stop smoking smoking the same amount    Self Care Goals & Plans:  Self Care Goal 07/19/2014  Manage my medications take my medicines as prescribed; bring my medications to every visit  Monitor my health -  Eat healthy foods eat foods that are low in salt; eat baked foods instead of fried foods  Be physically active find an activity I enjoy  Stop smoking -    No flowsheet data found.   Care Management & Community Referrals:  Referral 10/25/2012  Referrals made for care management support -  Referrals made to community resources smoking cessation

## 2014-07-19 NOTE — Assessment & Plan Note (Signed)
BP Readings from Last 3 Encounters:  07/19/14 144/102  06/18/14 140/80  06/14/14 180/118    Lab Results  Component Value Date   NA 140 10/03/2013   K 3.7 10/03/2013   CREATININE 0.89 10/03/2013    Assessment: Blood pressure control: mildly elevated Progress toward BP goal:  deteriorated Comments: Pt has not taken his amlodipine or toprol-XL in approx 3 days. He feels that the BB is making him dizzy. He is slightly orthostatic when standing with his HR increasind and DBP dropping >83mmHg.    Plan: Medications:  He is to stop the Toprol-XL but is to continue the amlodipine 10mg  daily.  Other plans: He was advised to stay hydrated and drink more water given his mild orthostasis. Also, checking BMP today to assess his electrolytes.   F/u in 2 weeks or sooner if the dizziness does not improve.

## 2014-07-19 NOTE — Progress Notes (Signed)
Internal Medicine Clinic Attending  Case discussed with Dr. Glenn at the time of the visit.  We reviewed the resident's history and exam and pertinent patient test results.  I agree with the assessment, diagnosis, and plan of care documented in the resident's note.  

## 2014-07-19 NOTE — Assessment & Plan Note (Addendum)
  Assessment: Progress toward smoking cessation:  smoking the same amount Barriers to progress toward smoking cessation:   Lack of motivation Comments: Pt still smoking 3-4 cigarettes a day  Plan: Instruction/counseling given:  I counseled patient on the dangers of tobacco use, advised patient to stop smoking, and reviewed strategies to maximize success. Pt is still not ready to quit.

## 2014-07-19 NOTE — Assessment & Plan Note (Signed)
Pt is not taking his statin. His lqst lipid panel was in '14. Will repeat lipid panel today and reorder simvastatin 20mg  daily.

## 2014-07-19 NOTE — Assessment & Plan Note (Signed)
Visual acuity unchanged. Per pt, he is having increased floaters in his vision. Will refer to opthalmology for complete eye exam.

## 2014-07-19 NOTE — Assessment & Plan Note (Addendum)
Prevnar 13 vaccine was given in the clinic today. He should receive the Pneumococcal vaccine 23 valent in 1 year.   Pt declined zostavax .

## 2014-07-19 NOTE — Progress Notes (Signed)
Patient ID: [name], male   DOB: 1950/11/08, 64 y.o.   MRN: [id number]  Subjective:   Patient ID: [name] male   DOB: [birth year] 64 y.o.   MRN: [id number]  HPI: Mr.Amani D Haynesworth is a 64 y.o. M w/ PMH remote CVA, SA node dysfunction s/p pacemaker placement, HLD, tobacco abuse, and HTN presents for a f/u for his blood pressure.  He was last seen in the clinic in April and changes to his blood pressure regimen were made at that time. Since then, he c/o dizziness.     Past Medical History  Diagnosis Date  . Tuberculosis at age 39-3  . Sickle cell trait   . Stroke 10/03/08    Right thalamus  . Hypertension     goal < 140/90  . Emphysema     per multiple CXRs  . Snoring     Sleep study 09/02/10 was WNL  . CEREBROVASCULAR ACCIDENT 10/17/2008    Acute CVA of right thalamus 10/03/08 ECHO performed 2/2 CVA 10/09/08, EF 55-65%  Aspirin 325 mg daily and try to work on quitting smoking.    Current Outpatient Prescriptions  Medication Sig Dispense Refill  . albuterol (PROVENTIL HFA;VENTOLIN HFA) 108 (90 BASE) MCG/ACT inhaler Inhale 2 puffs into the lungs every 6 (six) hours as needed for wheezing or shortness of breath. 1 Inhaler 2  . amLODipine (NORVASC) 10 MG tablet Take 1 tablet (10 mg total) by mouth daily. 30 tablet 1  . aspirin 325 MG tablet Take 325 mg by mouth every morning.    . chlorpheniramine-HYDROcodone (TUSSIONEX) 10-8 MG/5ML LQCR Take 5 mLs by mouth every 12 (twelve) hours as needed for cough. 473 mL 0  . meloxicam (MOBIC) 7.5 MG tablet Take 1 tablet (7.5 mg total) by mouth daily as needed for pain. 30 tablet 0  . metoprolol succinate (TOPROL-XL) 25 MG 24 hr tablet Take 1 tablet (25 mg total) by mouth daily. 30 tablet 1  . simvastatin (ZOCOR) 20 MG tablet Take 1 tablet (20 mg total) by mouth daily. (Patient not taking: Reported on 02/27/2014) 30 tablet 11   No current facility-administered medications for this visit.   Family History  Problem Relation Age of  Onset  . Cancer Sister   . Sickle cell anemia Son    History   Social History  . Marital Status: Married    Spouse Name: N/A  . Number of Children: N/A  . Years of Education: N/A   Occupational History  . unemployed    Social History Main Topics  . Smoking status: Current Every Day Smoker -- 0.30 packs/day for 10 years    Types: Cigarettes  . Smokeless tobacco: Not on file     Comment: Cutting back.  . Alcohol Use: No  . Drug Use: No     Comment: previous polysubstance abuser, quit x 13 years  . Sexual Activity: Not on file   Other Topics Concern  . Not on file   Social History Narrative   Lives in [place].  Works as a Pharmacologist for [employer]   Review of Systems: Constitutional: Denies fever, chills. +decreased appetite and fatigue.  HEENT: +floaters in his eyes, denies photophobia or eye pain. +sinus pressure and occasional PND.   Respiratory: +dry cough, intermittent. Denies SOB.   Cardiovascular: Denies chest pain or leg swelling.  Gastrointestinal: Denies nausea, vomiting, abdominal pain.  Genitourinary: Denies dysuria.  Musculoskeletal: +low back pain.  Neurological: +dizziness. Denies syncope or weakness.  Psychiatric/Behavioral: Denies suicidal ideation, mood changes   Objective:  Physical Exam: Filed Vitals:   07/19/14 1323  BP: 144/102  Pulse: 85  Temp: 98 F (36.7 C)  TempSrc: Oral  Weight: 166 lb 12.8 oz (75.66 kg)  SpO2: 100%   Constitutional: Vital signs reviewed.  Patient is a well-developed and well-nourished male in no acute distress and cooperative with exam. Alert and oriented x3.  Head: Normocephalic and atraumatic Eyes: PERRL, EOMI. No scleral icterus.  Neck: Supple, Trachea midline, normal ROM Cardiovascular: RRR, no MRG Pulmonary/Chest:Normal respiratory effort, CTAB, no wheezes, rales, or rhonchi Abdominal: Soft. Non-tender, non-distended  Musculoskeletal: No joint deformities Neurological: A&O x3, Strength  is normal and symmetric bilaterally, cranial nerve II-XII are grossly intact, no focal motor deficits Skin: Warm, dry and intact.   Psychiatric: Normal mood and affect. Speech and behavior is normal.   Assessment & Plan:   Please refer to Problem List based Assessment and Plan

## 2014-07-20 LAB — LIPID PANEL
CHOL/HDL RATIO: 4.6 ratio
Cholesterol: 201 mg/dL — ABNORMAL HIGH (ref 0–200)
HDL: 44 mg/dL (ref >=40)
LDL CALC: 136 mg/dL — AB (ref 0–99)
Triglycerides: 103 mg/dL (ref <150)
VLDL: 21 mg/dL (ref 0–40)

## 2014-07-20 LAB — BASIC METABOLIC PANEL WITH GFR
BUN: 13 mg/dL (ref 6–23)
CO2: 25 mEq/L (ref 19–32)
Calcium: 8.9 mg/dL (ref 8.4–10.5)
Chloride: 105 mEq/L (ref 96–112)
Creat: 0.9 mg/dL (ref 0.50–1.35)
GFR, Est African American: >89 mL/min
GFR, Est Non African American: 89 mL/min
Glucose, Bld: 80 mg/dL (ref 70–99)
POTASSIUM: 4.2 meq/L (ref 3.5–5.3)
SODIUM: 142 meq/L (ref 135–145)

## 2014-07-24 ENCOUNTER — Encounter: Payer: Self-pay | Admitting: *Deleted

## 2014-07-26 ENCOUNTER — Encounter: Payer: Medicare Other | Admitting: Internal Medicine

## 2014-08-01 DIAGNOSIS — H538 Other visual disturbances: Secondary | ICD-10-CM | POA: Diagnosis not present

## 2014-08-01 DIAGNOSIS — H25813 Combined forms of age-related cataract, bilateral: Secondary | ICD-10-CM | POA: Diagnosis not present

## 2014-08-02 ENCOUNTER — Encounter: Payer: Medicare Other | Admitting: Internal Medicine

## 2014-08-02 NOTE — Progress Notes (Signed)
This encounter was created in error - please disregard.

## 2014-09-28 ENCOUNTER — Telehealth: Payer: Self-pay | Admitting: *Deleted

## 2014-09-28 NOTE — Telephone Encounter (Signed)
Pt had left message about refill on Amlodipine. According to the med list , it was refilled in May x 11 refills. Called pt - no answer;left message about his refill and to call if he has any questions.

## 2014-10-02 ENCOUNTER — Encounter: Payer: Self-pay | Admitting: Internal Medicine

## 2014-10-02 ENCOUNTER — Ambulatory Visit (INDEPENDENT_AMBULATORY_CARE_PROVIDER_SITE_OTHER): Payer: Medicare Other | Admitting: Internal Medicine

## 2014-10-02 VITALS — BP 147/97 | HR 69 | Temp 98.1°F | Ht 73.0 in | Wt 165.6 lb

## 2014-10-02 DIAGNOSIS — Z720 Tobacco use: Secondary | ICD-10-CM

## 2014-10-02 DIAGNOSIS — F17210 Nicotine dependence, cigarettes, uncomplicated: Secondary | ICD-10-CM

## 2014-10-02 DIAGNOSIS — Z7982 Long term (current) use of aspirin: Secondary | ICD-10-CM | POA: Diagnosis not present

## 2014-10-02 DIAGNOSIS — I10 Essential (primary) hypertension: Secondary | ICD-10-CM | POA: Diagnosis present

## 2014-10-02 DIAGNOSIS — Z950 Presence of cardiac pacemaker: Secondary | ICD-10-CM | POA: Diagnosis not present

## 2014-10-02 NOTE — Assessment & Plan Note (Addendum)
BP Readings from Last 3 Encounters:  10/02/14 147/97  07/19/14 144/102  06/18/14 140/80    Lab Results  Component Value Date   NA 142 07/19/2014   K 4.2 07/19/2014   CREATININE 0.90 07/19/2014    Assessment: Blood pressure control:  reasonably controlled Progress toward BP goal:    Comments: Reports compliance with amlodipine.  OH symptoms have resolved off of Toprol.  Ideally, we want better control (<140/90) but he does not tolerate the first line meds and had dizziness with fairly low dose Toprol.  Plan: Medications:  continue current medications:  Amlodipine 10mg  daily Educational resources provided: brochure (denied) Self management tools provided:  BP log Other plans: He is able to check his BP at home.  He will check daily and bring BP log to next visit.  Advised avoiding added salt and to eat most meals at home so he can control ingredients.  RTC in 3 months for follow-up.

## 2014-10-02 NOTE — Progress Notes (Signed)
Internal Medicine Clinic Attending  Case discussed with Dr. Wilson at the time of the visit.  We reviewed the resident's history and exam and pertinent patient test results.  I agree with the assessment, diagnosis, and plan of care documented in the resident's note.  

## 2014-10-02 NOTE — Patient Instructions (Signed)
1. Please keep a log of your blood pressures and bring to your next visit.   2. Please take all medications as prescribed.    3. If you have worsening of your symptoms or new symptoms arise, please call the clinic ([phone]), or go to the ER immediately if symptoms are severe.

## 2014-10-02 NOTE — Assessment & Plan Note (Signed)
Scheduled for PPM interrogation next month.

## 2014-10-02 NOTE — Progress Notes (Signed)
Subjective:    Patient ID: [name], male    DOB: 1950-08-24, 64 y.o.   MRN: [id number]  HPI Comments: [name] is a 64 year old man with PMH as below here for follow-up.  Please see problem based charting for assessment and plan.     Past Medical History  Diagnosis Date  . Tuberculosis at age 50-3  . Sickle cell trait   . Stroke 10/03/08    Right thalamus  . Hypertension     goal < 140/90  . Emphysema     per multiple CXRs  . Snoring     Sleep study 09/02/10 was WNL  . CEREBROVASCULAR ACCIDENT 10/17/2008    Acute CVA of right thalamus 10/03/08 ECHO performed 2/2 CVA 10/09/08, EF 55-65%  Aspirin 325 mg daily and try to work on quitting smoking.    Current Outpatient Prescriptions on File Prior to Visit  Medication Sig Dispense Refill  . albuterol (PROVENTIL HFA;VENTOLIN HFA) 108 (90 BASE) MCG/ACT inhaler Inhale 2 puffs into the lungs every 6 (six) hours as needed for wheezing or shortness of breath. 1 Inhaler 2  . amLODipine (NORVASC) 10 MG tablet Take 1 tablet (10 mg total) by mouth daily. 30 tablet 11  . aspirin 325 MG tablet Take 325 mg by mouth every morning.    . chlorpheniramine-HYDROcodone (TUSSIONEX) 10-8 MG/5ML LQCR Take 5 mLs by mouth every 12 (twelve) hours as needed for cough. (Patient not taking: Reported on 07/19/2014) 473 mL 0  . meloxicam (MOBIC) 7.5 MG tablet Take 1 tablet (7.5 mg total) by mouth daily as needed for pain. 30 tablet 0  . simvastatin (ZOCOR) 20 MG tablet Take 1 tablet (20 mg total) by mouth daily. 30 tablet 11   No current facility-administered medications on file prior to visit.    Review of Systems  Constitutional: Negative for fever, chills and appetite change.  Eyes: Negative for visual disturbance.  Respiratory: Negative for shortness of breath.   Cardiovascular: Negative for chest pain and palpitations.  Gastrointestinal: Negative for nausea, vomiting, abdominal pain, diarrhea, constipation and blood in stool.  Genitourinary: Negative  for difficulty urinating.  Neurological: Negative for syncope, weakness, light-headedness and headaches.       Filed Vitals:   10/02/14 0900  BP: 146/91  Pulse: 73  Temp: 98.1 F (36.7 C)  TempSrc: Oral  Height: 6\' 1"  (1.854 m)  Weight: 165 lb 9.6 oz (75.116 kg)  SpO2: 100%    Objective:   Physical Exam  Constitutional: He is oriented to person, place, and time. He appears well-developed. No distress.  HENT:  Head: Normocephalic and atraumatic.  Mouth/Throat: Oropharynx is clear and moist. No oropharyngeal exudate.  Eyes: EOM are normal. Pupils are equal, round, and reactive to light.  Neck: Neck supple.  Cardiovascular: Normal rate, regular rhythm and normal heart sounds.  Exam reveals no gallop and no friction rub.   No murmur heard. Pulmonary/Chest: Effort normal and breath sounds normal. No respiratory distress. He has no wheezes. He has no rales.  Abdominal: Soft. Bowel sounds are normal. He exhibits no distension and no mass. There is no tenderness. There is no rebound and no guarding.  Musculoskeletal: Normal range of motion. He exhibits no edema or tenderness.  Neurological: He is alert and oriented to person, place, and time. No cranial nerve deficit.  Strength and sensation grossly intact and equal.  Skin: Skin is warm. He is not diaphoretic.  Psychiatric: He has a normal mood and affect. His  behavior is normal. Judgment and thought content normal.  Vitals reviewed.         Assessment & Plan:  Please see problem based charting for assessment and plan.

## 2014-10-02 NOTE — Assessment & Plan Note (Signed)
  Assessment: Progress toward smoking cessation:   unchanged; smoking 4 cigs/day Barriers to progress toward smoking cessation:   "likes the taste" Comments:   Plan: Instruction/counseling given:  I counseled patient on the dangers of tobacco use, advised patient to stop smoking, and reviewed strategies to maximize success. Educational resources provided:  [employer] account manager) brochure (denied ) Self management tools provided:    Medications to assist with smoking cessation:   Patient agreed to the following self-care plans for smoking cessation:    Other plans: continue to address at follow-up visit

## 2014-10-22 ENCOUNTER — Ambulatory Visit (INDEPENDENT_AMBULATORY_CARE_PROVIDER_SITE_OTHER): Payer: Medicare Other | Admitting: Internal Medicine

## 2014-10-22 ENCOUNTER — Encounter: Payer: Self-pay | Admitting: Internal Medicine

## 2014-10-22 VITALS — BP 120/78 | HR 80 | Ht 73.0 in | Wt 166.8 lb

## 2014-10-22 DIAGNOSIS — Z720 Tobacco use: Secondary | ICD-10-CM

## 2014-10-22 DIAGNOSIS — I495 Sick sinus syndrome: Secondary | ICD-10-CM

## 2014-10-22 DIAGNOSIS — Z950 Presence of cardiac pacemaker: Secondary | ICD-10-CM | POA: Diagnosis not present

## 2014-10-22 DIAGNOSIS — I10 Essential (primary) hypertension: Secondary | ICD-10-CM

## 2014-10-22 LAB — CUP PACEART INCLINIC DEVICE CHECK
Battery Remaining Longevity: 112 mo
Battery Voltage: 2.79 V
Brady Statistic AS VS Percent: 51 %
Date Time Interrogation Session: 20160815125517
Lead Channel Impedance Value: 571 Ohm
Lead Channel Pacing Threshold Amplitude: 0.75 V
Lead Channel Pacing Threshold Pulse Width: 0.4 ms
Lead Channel Pacing Threshold Pulse Width: 0.4 ms
Lead Channel Sensing Intrinsic Amplitude: 8 mV
Lead Channel Setting Pacing Amplitude: 2.5 V
Lead Channel Setting Sensing Sensitivity: 2.8 mV
MDC IDC MSMT BATTERY IMPEDANCE: 316 Ohm
MDC IDC MSMT LEADCHNL RA PACING THRESHOLD AMPLITUDE: 0.75 V
MDC IDC MSMT LEADCHNL RA SENSING INTR AMPL: 4 mV
MDC IDC MSMT LEADCHNL RV IMPEDANCE VALUE: 450 Ohm
MDC IDC SET LEADCHNL RA PACING AMPLITUDE: 2 V
MDC IDC SET LEADCHNL RV PACING PULSEWIDTH: 0.4 ms
MDC IDC STAT BRADY AP VP PERCENT: 0 %
MDC IDC STAT BRADY AP VS PERCENT: 49 %
MDC IDC STAT BRADY AS VP PERCENT: 0 %

## 2014-10-22 NOTE — Progress Notes (Signed)
Sherin Quarry, MD is PCP  [name] is a 64 y.o. male with a h/o bradycardia sp PPM (MDT) by Dr Blanch Media in 2010 who presents today for follow-up in the Electrophysiology device clinic.   He is doing well at this time.  Today, he  denies symptoms of palpitations, chest pain, shortness of breath, orthopnea, PND, lower extremity edema, dizziness, presyncope, syncope, or neurologic sequela.  He smokes < 1/2 PPD but is not interested in quitting.  The patientis tolerating medications without difficulties and is otherwise without complaint today.   Past Medical History  Diagnosis Date  . Tuberculosis at age 47-3  . Sickle cell trait   . Stroke 10/03/08    Right thalamus  . Hypertension     goal < 140/90  . Emphysema     per multiple CXRs  . Snoring     Sleep study 09/02/10 was WNL  . CEREBROVASCULAR ACCIDENT 10/17/2008    Acute CVA of right thalamus 10/03/08 ECHO performed 2/2 CVA 10/09/08, EF 55-65%  Aspirin 325 mg daily and try to work on quitting smoking.    Past Surgical History  Procedure Laterality Date  . Appendectomy  1977  . Vasectomy  1990  . Pacemaker insertion  07/24/08    MDT Adapta L implanted by Dr Blanch Media    Social History   Social History  . Marital Status: Married    Spouse Name: N/A  . Number of Children: N/A  . Years of Education: N/A   Occupational History  . unemployed    Social History Main Topics  . Smoking status: Current Every Day Smoker -- 0.30 packs/day for 10 years    Types: Cigarettes  . Smokeless tobacco: Not on file     Comment: Cutting back.  . Alcohol Use: No  . Drug Use: No     Comment: previous polysubstance abuser, quit x 13 years  . Sexual Activity: Not on file   Other Topics Concern  . Not on file   Social History Narrative   Lives in [place].  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 Cancer Sister   . Sickle cell anemia Son   . Hypertension Mother       Allergies  Allergen Reactions  . Ace Inhibitors     Patient presented with right upper lip swelling along with tingling and numbness. Possible allergic reaction to ACEI.    Current Outpatient Prescriptions  Medication Sig Dispense Refill  . albuterol (PROVENTIL HFA;VENTOLIN HFA) 108 (90 BASE) MCG/ACT inhaler Inhale 2 puffs into the lungs every 6 (six) hours as needed for wheezing or shortness of breath. 1 Inhaler 2  . amLODipine (NORVASC) 10 MG tablet Take 1 tablet (10 mg total) by mouth daily. 30 tablet 11  . aspirin 325 MG tablet Take 325 mg by mouth every morning.    . meloxicam (MOBIC) 7.5 MG tablet Take 1 tablet (7.5 mg total) by mouth daily as needed for pain. 30 tablet 0  . simvastatin (ZOCOR) 20 MG tablet Take 1 tablet (20 mg total) by mouth daily. 30 tablet 11   No current facility-administered medications for this visit.    ROS- all systems are reviewed and negative except as per HPI  Physical Exam: Filed Vitals:   10/22/14 0939  BP: 120/78  Pulse: 80  Height: 6\' 1"  (1.854 m)  Weight: 75.66 kg (166 lb 12.8 oz)  SpO2:  99%    GEN- The patient is well appearing, alert and oriented x 3 today.   Head- normocephalic, atraumatic Eyes-  Sclera clear, conjunctiva pink Ears- hearing intact Oropharynx- clear Neck- supple,  Lungs- Clear to ausculation bilaterally, normal work of breathing Chest- pacemaker pocket is well healed Heart- Regular rate and rhythm, no murmurs, rubs or gallops, PMI not laterally displaced GI- soft, NT, ND, + BS Extremities- no clubbing, cyanosis, or edema MS- no significant deformity or atrophy Skin- no rash or lesion Psych- euthymic mood, full affect Neuro- strength and sensation are intact  Pacemaker interrogation- reviewed in detail today,  See PACEART report  Assessment and Plan:  1. Sick sinus Normal pacemaker function See Pace Art report No changes today I have again encouraged home monitoring today  2. htn Stable No change  required today  3. Tobacco Cessation is strongly advised He is not yet ready to quite  carelink Return to see EP NP in 1 year

## 2014-10-22 NOTE — Patient Instructions (Signed)
Medication Instructions:  Your physician recommends that you continue on your current medications as directed. Please refer to the Current Medication list given to you today.   Labwork: None ordered  Testing/Procedures: None ordered  Follow-Up: Your physician wants you to follow-up in: 12 months with Chanetta Marshall, NP You will receive a reminder letter in the mail two months in advance. If you don't receive a letter, please call our office to schedule the follow-up appointment.  Remote monitoring is used to monitor your Pacemaker from home. This monitoring reduces the number of office visits required to check your device to one time per year. It allows [place] to keep an eye on the functioning of your device to ensure it is working properly. You are scheduled for a device check from home on 01/21/15. You may send your transmission at any time that day. If you have a wireless device, the transmission will be sent automatically. After your physician reviews your transmission, you will receive a postcard with your next transmission date.     Any Other Special Instructions Will Be Listed Below (If Applicable).  Smoking Cessation Quitting smoking is important to your health and has many advantages. However, it is not always easy to quit since nicotine is a very addictive drug. Oftentimes, people try 3 times or more before being able to quit. This document explains the best ways for you to prepare to quit smoking. Quitting takes hard work and a lot of effort, but you can do it. ADVANTAGES OF QUITTING SMOKING  You will live longer, feel better, and live better.  Your body will feel the impact of quitting smoking almost immediately.  Within 20 minutes, blood pressure decreases. Your pulse returns to its normal level.  After 8 hours, carbon monoxide levels in the blood return to normal. Your oxygen level increases.  After 24 hours, the chance of having a heart attack starts to decrease. Your breath,  hair, and body stop smelling like smoke.  After 48 hours, damaged nerve endings begin to recover. Your sense of taste and smell improve.  After 72 hours, the body is virtually free of nicotine. Your bronchial tubes relax and breathing becomes easier.  After 2 to 12 weeks, lungs can hold more air. Exercise becomes easier and circulation improves.  The risk of having a heart attack, stroke, cancer, or lung disease is greatly reduced.  After 1 year, the risk of coronary heart disease is cut in half.  After 5 years, the risk of stroke falls to the same as a nonsmoker.  After 10 years, the risk of lung cancer is cut in half and the risk of other cancers decreases significantly.  After 15 years, the risk of coronary heart disease drops, usually to the level of a nonsmoker.  If you are pregnant, quitting smoking will improve your chances of having a healthy baby.  The people you live with, especially any children, will be healthier.  You will have extra money to spend on things other than cigarettes. QUESTIONS TO THINK ABOUT BEFORE ATTEMPTING TO QUIT You may want to talk about your answers with your health care provider.  Why do you want to quit?  If you tried to quit in the past, what helped and what did not?  What will be the most difficult situations for you after you quit? How will you plan to handle them?  Who can help you through the tough times? Your family? Friends? A health care provider?  What pleasures do you  get from smoking? What ways can you still get pleasure if you quit? Here are some questions to ask your health care provider:  How can you help me to be successful at quitting?  What medicine do you think would be best for me and how should I take it?  What should I do if I need more help?  What is smoking withdrawal like? How can I get information on withdrawal? GET READY  Set a quit date.  Change your environment by getting rid of all cigarettes, ashtrays,  matches, and lighters in your home, car, or work. Do not let people smoke in your home.  Review your past attempts to quit. Think about what worked and what did not. GET SUPPORT AND ENCOURAGEMENT You have a better chance of being successful if you have help. You can get support in many ways.  Tell your family, friends, and coworkers that you are going to quit and need their support. Ask them not to smoke around you.  Get individual, group, or telephone counseling and support. Programs are available at [employer] and health centers. Call your local health department for information about programs in your area.  Spiritual beliefs and practices may help some smokers quit.  Download a "quit meter" on your computer to keep track of quit statistics, such as how long you have gone without smoking, cigarettes not smoked, and money saved.  Get a self-help book about quitting smoking and staying off tobacco. [place] yourself from urges to smoke. Talk to someone, go for a walk, or occupy your time with a task.  Change your normal routine. Take a different route to work. Drink tea instead of coffee. Eat breakfast in a different place.  Reduce your stress. Take a hot bath, exercise, or read a book.  Plan something enjoyable to do every day. Reward yourself for not smoking.  Explore interactive web-based programs that specialize in helping you quit. GET MEDICINE AND USE IT CORRECTLY Medicines can help you stop smoking and decrease the urge to smoke. Combining medicine with the above behavioral methods and support can greatly increase your chances of successfully quitting smoking.  Nicotine replacement therapy helps deliver nicotine to your body without the negative effects and risks of smoking. Nicotine replacement therapy includes nicotine gum, lozenges, inhalers, nasal sprays, and skin patches. Some may be available over-the-counter and others require a  prescription.  Antidepressant medicine helps people abstain from smoking, but how this works is unknown. This medicine is available by prescription.  Nicotinic receptor partial agonist medicine simulates the effect of nicotine in your brain. This medicine is available by prescription. Ask your health care provider for advice about which medicines to use and how to use them based on your health history. Your health care provider will tell you what side effects to look out for if you choose to be on a medicine or therapy. Carefully read the information on the package. Do not use any other product containing nicotine while using a nicotine replacement product.  RELAPSE OR DIFFICULT SITUATIONS Most relapses occur within the first 3 months after quitting. Do not be discouraged if you start smoking again. Remember, most people try several times before finally quitting. You may have symptoms of withdrawal because your body is used to nicotine. You may crave cigarettes, be irritable, feel very hungry, cough often, get headaches, or have difficulty concentrating. The withdrawal symptoms are only temporary. They are strongest when you first quit, but  they will go away within 10-14 days. To reduce the chances of relapse, try to:  Avoid drinking alcohol. Drinking lowers your chances of successfully quitting.  Reduce the amount of caffeine you consume. Once you quit smoking, the amount of caffeine in your body increases and can give you symptoms, such as a rapid heartbeat, sweating, and anxiety.  Avoid smokers because they can make you want to smoke.  Do not let weight gain distract you. Many smokers will gain weight when they quit, usually less than 10 pounds. Eat a healthy diet and stay active. You can always lose the weight gained after you quit.  Find ways to improve your mood other than smoking. FOR MORE INFORMATION  www.smokefree.gov  Document Released: 02/17/2001 Document Revised: 07/10/2013 Document  Reviewed: 06/04/2011 [facility] Patient Information 2015 [place], [place]. This information is not intended to replace advice given to you by your health care provider. Make sure you discuss any questions you have with your health care provider.

## 2015-01-04 ENCOUNTER — Encounter: Payer: Self-pay | Admitting: Internal Medicine

## 2015-01-04 ENCOUNTER — Ambulatory Visit (INDEPENDENT_AMBULATORY_CARE_PROVIDER_SITE_OTHER): Payer: Medicare Other | Admitting: Internal Medicine

## 2015-01-04 VITALS — BP 153/101 | HR 63 | Temp 97.8°F | Ht 73.0 in | Wt 166.4 lb

## 2015-01-04 DIAGNOSIS — E785 Hyperlipidemia, unspecified: Secondary | ICD-10-CM

## 2015-01-04 DIAGNOSIS — R350 Frequency of micturition: Secondary | ICD-10-CM

## 2015-01-04 DIAGNOSIS — I10 Essential (primary) hypertension: Secondary | ICD-10-CM | POA: Diagnosis present

## 2015-01-04 DIAGNOSIS — Z7982 Long term (current) use of aspirin: Secondary | ICD-10-CM

## 2015-01-04 DIAGNOSIS — Z720 Tobacco use: Secondary | ICD-10-CM

## 2015-01-04 DIAGNOSIS — F17210 Nicotine dependence, cigarettes, uncomplicated: Secondary | ICD-10-CM

## 2015-01-04 DIAGNOSIS — Z23 Encounter for immunization: Secondary | ICD-10-CM | POA: Diagnosis present

## 2015-01-04 LAB — POCT URINALYSIS DIPSTICK
Bilirubin, UA: NEGATIVE
Blood, UA: NEGATIVE
GLUCOSE UA: NEGATIVE
Ketones, UA: NEGATIVE
NITRITE UA: NEGATIVE
Protein, UA: NEGATIVE
Spec Grav, UA: 1.025
Urobilinogen, UA: 1
pH, UA: 6

## 2015-01-04 MED ORDER — AMLODIPINE BESYLATE 10 MG PO TABS
10.0000 mg | ORAL_TABLET | Freq: Every day | ORAL | Status: DC
Start: 2015-01-04 — End: 2016-01-14

## 2015-01-04 MED ORDER — SIMVASTATIN 20 MG PO TABS: 20.0000 mg | ORAL_TABLET | Freq: Every day | ORAL | Status: DC

## 2015-01-04 NOTE — Assessment & Plan Note (Signed)
Patient has not been taking his Simvastatin.  Will defer lipid check until patient has been adherent for 3 months.  A/P: Poorly controlled HLD 2/2 nonadherence. - Simvastatin 20 mg daily

## 2015-01-04 NOTE — Assessment & Plan Note (Signed)
Patient has not been taking his Amlodipine or Simvastatin because he likes "to test" himself sometimes.  He understands that HTN is a silent disease and even though he may not feel the effects, it puts him at risk for repeat stroke.  He denies high salt diet, instead eating only one meal per day.  He agrees to take his medications and return for BP check.  BP Readings from Last 3 Encounters:  01/04/15 159/104  10/22/14 120/78  10/02/14 147/97    Lab Results  Component Value Date   NA 142 07/19/2014   K 4.2 07/19/2014   CREATININE 0.90 07/19/2014    Assessment: Blood pressure control:  poor  Progress toward BP goal:    deteriorating 2/2 non-adherence  Comments: Patient verbalizes understanding of silent nature of HTN  Plan: Medications:  continue current medications: Amlodipine 10 mg daily Educational resources provided: brochure Self management tools provided: home blood pressure logbook Other plans: return for recheck

## 2015-01-04 NOTE — Assessment & Plan Note (Signed)
Patient still smoking 4-5 cigarettes per day. He verbalizes understanding of the health consequences.  He is slowly cutting back by only smoking half a cigarette at a time.  He does not desire any help to help him quit. - CTM

## 2015-01-04 NOTE — Progress Notes (Signed)
Medicine attending: I personally interviewed and briefly examined this patient, and reviewed pertinent clinical laboratory data  with resident physician Dr.Nicholas Taylor and we discussed a   management plan. 

## 2015-01-04 NOTE — Patient Instructions (Signed)
1. Take your Amlodipine 10 mg daily 2. Take your Simvastatin 20 mg daily 3. Keep up on your quest to quit smoking. 4. Return to clinic in 3 months  Hypertension Hypertension, commonly called high blood pressure, is when the force of blood pumping through your arteries is too strong. Your arteries are the blood vessels that carry blood from your heart throughout your body. A blood pressure reading consists of a higher number over a lower number, such as 110/72. The higher number (systolic) is the pressure inside your arteries when your heart pumps. The lower number (diastolic) is the pressure inside your arteries when your heart relaxes. Ideally you want your blood pressure below 120/80. Hypertension forces your heart to work harder to pump blood. Your arteries may become narrow or stiff. Having untreated or uncontrolled hypertension can cause heart attack, stroke, kidney disease, and other problems. RISK FACTORS Some risk factors for high blood pressure are controllable. Others are not.  Risk factors you cannot control include:   Race. You may be at higher risk if you are African American.  Age. Risk increases with age.  Gender. Men are at higher risk than women before age 43 years. After age 39, women are at higher risk than men. Risk factors you can control include:  Not getting enough exercise or physical activity.  Being overweight.  Getting too much fat, sugar, calories, or salt in your diet.  Drinking too much alcohol. SIGNS AND SYMPTOMS Hypertension does not usually cause signs or symptoms. Extremely high blood pressure (hypertensive crisis) may cause headache, anxiety, shortness of breath, and nosebleed. DIAGNOSIS To check if you have hypertension, your health care provider will measure your blood pressure while you are seated, with your arm held at the level of your heart. It should be measured at least twice using the same arm. Certain conditions can cause a difference in blood  pressure between your right and left arms. A blood pressure reading that is higher than normal on one occasion does not mean that you need treatment. If it is not clear whether you have high blood pressure, you may be asked to return on a different day to have your blood pressure checked again. Or, you may be asked to monitor your blood pressure at home for 1 or more weeks. TREATMENT Treating high blood pressure includes making lifestyle changes and possibly taking medicine. Living a healthy lifestyle can help lower high blood pressure. You may need to change some of your habits. Lifestyle changes may include:  Following the DASH diet. This diet is high in fruits, vegetables, and whole grains. It is low in salt, red meat, and added sugars.  Keep your sodium intake below 2,300 mg per day.  Getting at least 30-45 minutes of aerobic exercise at least 4 times per week.  Losing weight if necessary.  Not smoking.  Limiting alcoholic beverages.  Learning ways to reduce stress. Your health care provider may prescribe medicine if lifestyle changes are not enough to get your blood pressure under control, and if one of the following is true:  You are 19-62 years of age and your systolic blood pressure is above 140.  You are 29 years of age or older, and your systolic blood pressure is above 150.  Your diastolic blood pressure is above 90.  You have diabetes, and your systolic blood pressure is over 270 or your diastolic blood pressure is over 90.  You have kidney disease and your blood pressure is above 140/90.  You have heart disease and your blood pressure is above 140/90. Your personal target blood pressure may vary depending on your medical conditions, your age, and other factors. HOME CARE INSTRUCTIONS  Have your blood pressure rechecked as directed by your health care provider.   Take medicines only as directed by your health care provider. Follow the directions carefully. Blood  pressure medicines must be taken as prescribed. The medicine does not work as well when you skip doses. Skipping doses also puts you at risk for problems.  Do not smoke.   Monitor your blood pressure at home as directed by your health care provider. SEEK MEDICAL CARE IF:   You think you are having a reaction to medicines taken.  You have recurrent headaches or feel dizzy.  You have swelling in your ankles.  You have trouble with your vision. SEEK IMMEDIATE MEDICAL CARE IF:  You develop a severe headache or confusion.  You have unusual weakness, numbness, or feel faint.  You have severe chest or abdominal pain.  You vomit repeatedly.  You have trouble breathing. MAKE SURE YOU:   Understand these instructions.  Will watch your condition.  Will get help right away if you are not doing well or get worse.   This information is not intended to replace advice given to you by your health care provider. Make sure you discuss any questions you have with your health care provider.   Document Released: 02/23/2005 Document Revised: 07/10/2014 Document Reviewed: 12/16/2012 Elsevier Interactive Patient Education [employer].

## 2015-01-04 NOTE — Progress Notes (Signed)
Patient ID: [name], male   DOB: 09-19-1950, 64 y.o.   MRN: [id number]    Subjective:   Patient ID: [name] male   DOB: 07-21-1950 64 y.o.   MRN: [id number]  HPI: [name] is a 64 y.o. male with PMH as below, here for regular follow up.  Please see Problem-Based charting for the status of the patient's chronic medical issues.  ROS: Negative for fever, chills, congestion, runny nose, vomiting, constipation, diarrhea, dysuria, urinary urgency, or leg swelling.   Past Medical History  Diagnosis Date  . Tuberculosis at age 61-3  . Sickle cell trait   . Stroke 10/03/08    Right thalamus  . Hypertension     goal < 140/90  . Emphysema     per multiple CXRs  . Snoring     Sleep study 09/02/10 was WNL  . CEREBROVASCULAR ACCIDENT 10/17/2008    Acute CVA of right thalamus 10/03/08 ECHO performed 2/2 CVA 10/09/08, EF 55-65%  Aspirin 325 mg daily and try to work on quitting smoking.    Current Outpatient Prescriptions  Medication Sig Dispense Refill  . albuterol (PROVENTIL HFA;VENTOLIN HFA) 108 (90 BASE) MCG/ACT inhaler Inhale 2 puffs into the lungs every 6 (six) hours as needed for wheezing or shortness of breath. 1 Inhaler 2  . amLODipine (NORVASC) 10 MG tablet Take 1 tablet (10 mg total) by mouth daily. 30 tablet 11  . aspirin 325 MG tablet Take 325 mg by mouth every morning.    . meloxicam (MOBIC) 7.5 MG tablet Take 1 tablet (7.5 mg total) by mouth daily as needed for pain. 30 tablet 0  . simvastatin (ZOCOR) 20 MG tablet Take 1 tablet (20 mg total) by mouth daily. 30 tablet 11   No current facility-administered medications for this visit.   Family History  Problem Relation Age of Onset  . Cancer Sister   . Sickle cell anemia Son   . Hypertension Mother    Social History   Social History  . Marital Status: Married    Spouse Name: N/A  . Number of Children: N/A  . Years of Education: N/A   Occupational History  . unemployed    Social History Main  Topics  . Smoking status: Current Every Day Smoker -- 0.30 packs/day for 10 years    Types: Cigarettes  . Smokeless tobacco: Not on file     Comment: Cutting back.  . Alcohol Use: No  . Drug Use: No     Comment: previous polysubstance abuser, quit x 13 years  . Sexual Activity: Not on file   Other Topics Concern  . Not on file   Social History Narrative   Lives in [place].  Works as a Pharmacologist for [employer]   Review of Systems: A comprehensive review of systems was negative except for: Gastrointestinal: positive for nausea Genitourinary: positive for frequency Objective:  Physical Exam: There were no vitals filed for this visit. Physical Exam  Constitutional: He is oriented to person, place, and time and well-developed, well-nourished, and in no distress.  HENT:  Head: Normocephalic and atraumatic.  Eyes: EOM are normal.  Sclera pigmented without signs of icterus.  Neck: Normal range of motion. No JVD present. No tracheal deviation present.  Cardiovascular: Normal rate, regular rhythm and normal heart sounds.   Pulmonary/Chest: Effort normal and breath sounds normal. No respiratory distress. He has no wheezes.  Abdominal: Soft. He exhibits no distension. There is no tenderness. There is  no rebound and no guarding.  Musculoskeletal: He exhibits no edema.  Neurological: He is alert and oriented to person, place, and time.  Skin: Skin is warm and dry.     Assessment & Plan:   Patient and case were discussed with Dr. Beryle Beams.  Please refer to Problem Based charting for further documentation.

## 2015-01-04 NOTE — Assessment & Plan Note (Addendum)
Patient reports urinary frequency and dark urine, despite not drinking excessive PO fluids.  He has a 55 year smoking history and longstanding hypertension.    A/P: given dark urine and smoking history, there is concern for hematuria.  Also possible BPH. Will check prostate on next visit. - UA to assess for glucosuria or hematuria

## 2015-01-21 ENCOUNTER — Telehealth: Payer: Self-pay | Admitting: Cardiology

## 2015-01-21 ENCOUNTER — Encounter: Payer: Medicare Other | Admitting: *Deleted

## 2015-01-21 NOTE — Telephone Encounter (Signed)
LMOVM reminding pt to send remote transmission.   

## 2015-01-22 ENCOUNTER — Encounter: Payer: Self-pay | Admitting: Cardiology

## 2015-04-05 ENCOUNTER — Encounter: Payer: Self-pay | Admitting: Internal Medicine

## 2015-04-05 ENCOUNTER — Ambulatory Visit (INDEPENDENT_AMBULATORY_CARE_PROVIDER_SITE_OTHER): Payer: Medicare Other | Admitting: Internal Medicine

## 2015-04-05 VITALS — BP 140/80 | HR 80 | Temp 97.6°F | Wt 166.1 lb

## 2015-04-05 DIAGNOSIS — Z7982 Long term (current) use of aspirin: Secondary | ICD-10-CM | POA: Diagnosis not present

## 2015-04-05 DIAGNOSIS — F17210 Nicotine dependence, cigarettes, uncomplicated: Secondary | ICD-10-CM

## 2015-04-05 DIAGNOSIS — Z720 Tobacco use: Secondary | ICD-10-CM

## 2015-04-05 DIAGNOSIS — I10 Essential (primary) hypertension: Secondary | ICD-10-CM

## 2015-04-05 DIAGNOSIS — M545 Low back pain, unspecified: Secondary | ICD-10-CM

## 2015-04-05 DIAGNOSIS — E785 Hyperlipidemia, unspecified: Secondary | ICD-10-CM

## 2015-04-05 MED ORDER — HYDROCHLOROTHIAZIDE 12.5 MG PO TABS: 12.5000 mg | ORAL_TABLET | Freq: Every day | ORAL | Status: DC

## 2015-04-05 NOTE — Assessment & Plan Note (Signed)
Patient continues to smoke 3 cigarettes daily because he likes the taste.  He is precontemplative and does not want to quit.  A/P: tobacco use disorder.  Counseling again provided, though patient is not ready for change.

## 2015-04-05 NOTE — Assessment & Plan Note (Signed)
Patient complaining of 1 week of pinpoint tenderness right low back pain without radiation or neurologic symptoms.  No red flags.  Conservative management warranted and will treat symptomatically and monitor.  A/P: MSK back pain. - PRN Ibuprofen

## 2015-04-05 NOTE — Progress Notes (Signed)
Patient ID: [name], male   DOB: 02-Jan-1951, 65 y.o.   MRN: [id number]    Subjective:   Patient ID: [name] male   DOB: 10-31-1950 65 y.o.   MRN: [id number]  HPI: [name] is a 66 y.o. male with PMH as below, here for f/u HTN.  Please see Problem-Based charting for the status of the patient's chronic medical issues.  Patient reports 1 week history of low back pain described as feeling like he "pulled a muscle."  It does not radiate.  He denies physical exertion or trauma.  He attributes the pain to drinking a lot of sweet tea without drinking any water.  The pain has started to decrease now that he is drinking water.  He has not used any medications for management.  He denies abdominal pain, hematuria, leg weakness, or numbness.   He endorses some nausea on most days, which he attributes to not eating breakfast.  He only eats 1 meal a day, which has been stable for him for a long time.   Past Medical History  Diagnosis Date  . Tuberculosis at age 36-3  . Sickle cell trait ([place])   . Stroke [facility]) 10/03/08    Right thalamus  . Hypertension     goal < 140/90  . Emphysema     per multiple CXRs  . Snoring     Sleep study 09/02/10 was WNL  . CEREBROVASCULAR ACCIDENT 10/17/2008    Acute CVA of right thalamus 10/03/08 ECHO performed 2/2 CVA 10/09/08, EF 55-65%  Aspirin 325 mg daily and try to work on quitting smoking.    Current Outpatient Prescriptions  Medication Sig Dispense Refill  . albuterol (PROVENTIL HFA;VENTOLIN HFA) 108 (90 BASE) MCG/ACT inhaler Inhale 2 puffs into the lungs every 6 (six) hours as needed for wheezing or shortness of breath. 1 Inhaler 2  . amLODipine (NORVASC) 10 MG tablet Take 1 tablet (10 mg total) by mouth daily. 30 tablet 11  . aspirin 325 MG tablet Take 325 mg by mouth every morning.    . meloxicam (MOBIC) 7.5 MG tablet Take 1 tablet (7.5 mg total) by mouth daily as needed for pain. 30 tablet 0  . simvastatin (ZOCOR) 20 MG tablet Take 1  tablet (20 mg total) by mouth daily. 30 tablet 11   No current facility-administered medications for this visit.   Family History  Problem Relation Age of Onset  . Cancer Sister   . Sickle cell anemia Son   . Hypertension Mother    Social History   Social History  . Marital Status: Married    Spouse Name: N/A  . Number of Children: N/A  . Years of Education: N/A   Occupational History  . unemployed    Social History Main Topics  . Smoking status: Current Every Day Smoker -- 0.30 packs/day for 10 years    Types: Cigarettes  . Smokeless tobacco: Not on file     Comment: Cutting back. DOWN TO 4-5CIAGARETTES  . Alcohol Use: No  . Drug Use: No     Comment: previous polysubstance abuser, quit x 13 years  . Sexual Activity: Not on file   Other Topics Concern  . Not on file   Social History Narrative   Lives in [place].  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 Objective:  Physical Exam: There were no vitals filed  for this visit. Physical Exam  Constitutional: He is oriented to person, place, and time and well-developed, well-nourished, and in no distress. No distress.  HENT:  Head: Normocephalic and atraumatic.  Eyes: EOM are normal. No scleral icterus.  Neck: No JVD present. No tracheal deviation present.  Cardiovascular: Normal rate, regular rhythm and normal heart sounds.   Pulmonary/Chest: Effort normal and breath sounds normal. No stridor. No respiratory distress. He has no wheezes.  Abdominal: Soft. He exhibits no distension. There is no tenderness. There is no rebound and no guarding.  Musculoskeletal: He exhibits no edema.  Neurological: He is alert and oriented to person, place, and time. Gait normal.  Strength 5/5 in bilateral LE.  Straight leg negative.  Patellar reflexes 1+ bilaterally.  Skin: Skin is warm and dry. He is not  diaphoretic.     Assessment & Plan:   Patient and case were discussed with Dr. Evette Doffing.  Please refer to Problem Based charting for further documentation.

## 2015-04-05 NOTE — Patient Instructions (Signed)
1. Continue your medications. 2. Start taking Hydrochlorothiazide 12.5 mg (1 tab) every morning for BP. 3. Return to clinic in 1 month.  Hydrochlorothiazide, HCTZ capsules or tablets What is this medicine? HYDROCHLOROTHIAZIDE (hye droe klor oh THYE a zide) is a diuretic. It increases the amount of urine passed, which causes the body to lose salt and water. This medicine is used to treat high blood pressure. It is also reduces the swelling and water retention caused by various medical conditions, such as heart, liver, or kidney disease. This medicine may be used for other purposes; ask your health care provider or pharmacist if you have questions. What should I tell my health care provider before I take this medicine? They need to know if you have any of these conditions: -diabetes -gout -immune system problems, like lupus -kidney disease or kidney stones -liver disease -pancreatitis -small amount of urine or difficulty passing urine -an unusual or allergic reaction to hydrochlorothiazide, sulfa drugs, other medicines, foods, dyes, or preservatives -pregnant or trying to get pregnant -breast-feeding How should I use this medicine? Take this medicine by mouth with a glass of water. Follow the directions on the prescription label. Take your medicine at regular intervals. Remember that you will need to pass urine frequently after taking this medicine. Do not take your doses at a time of day that will cause you problems. Do not stop taking your medicine unless your doctor tells you to. Talk to your pediatrician regarding the use of this medicine in children. Special care may be needed. Overdosage: If you think you have taken too much of this medicine contact a poison control center or emergency room at once. NOTE: This medicine is only for you. Do not share this medicine with others. What if I miss a dose? If you miss a dose, take it as soon as you can. If it is almost time for your next dose,  take only that dose. Do not take double or extra doses. What may interact with this medicine? -cholestyramine -colestipol -digoxin -dofetilide -lithium -medicines for blood pressure -medicines for diabetes -medicines that relax muscles for surgery -other diuretics -steroid medicines like prednisone or cortisone This list may not describe all possible interactions. Give your health care provider a list of all the medicines, herbs, non-prescription drugs, or dietary supplements you use. Also tell them if you smoke, drink alcohol, or use illegal drugs. Some items may interact with your medicine. What should I watch for while using this medicine? Visit your doctor or health care professional for regular checks on your progress. Check your blood pressure as directed. Ask your doctor or health care professional what your blood pressure should be and when you should contact him or her. You may need to be on a special diet while taking this medicine. Ask your doctor. Check with your doctor or health care professional if you get an attack of severe diarrhea, nausea and vomiting, or if you sweat a lot. The loss of too much body fluid can make it dangerous for you to take this medicine. You may get drowsy or dizzy. Do not drive, use machinery, or do anything that needs mental alertness until you know how this medicine affects you. Do not stand or sit up quickly, especially if you are an older patient. This reduces the risk of dizzy or fainting spells. Alcohol may interfere with the effect of this medicine. Avoid alcoholic drinks. This medicine may affect your blood sugar level. If you have diabetes, check with your  doctor or health care professional before changing the dose of your diabetic medicine. This medicine can make you more sensitive to the sun. Keep out of the sun. If you cannot avoid being in the sun, wear protective clothing and use sunscreen. Do not use sun lamps or tanning beds/booths. What  side effects may I notice from receiving this medicine? Side effects that you should report to your doctor or health care professional as soon as possible: -allergic reactions such as skin rash or itching, hives, swelling of the lips, mouth, tongue, or throat -changes in vision -chest pain -eye pain -fast or irregular heartbeat -feeling faint or lightheaded, falls -gout attack -muscle pain or cramps -pain or difficulty when passing urine -pain, tingling, numbness in the hands or feet -redness, blistering, peeling or loosening of the skin, including inside the mouth -unusually weak or tired Side effects that usually do not require medical attention (report to your doctor or health care professional if they continue or are bothersome): -change in sex drive or performance -dry mouth -headache -stomach upset This list may not describe all possible side effects. Call your doctor for medical advice about side effects. You may report side effects to FDA at 1-800-FDA-1088. Where should I keep my medicine? Keep out of the reach of children. Store at room temperature between 15 and 30 degrees C (59 and 86 degrees F). Do not freeze. Protect from light and moisture. Keep container closed tightly. Throw away any unused medicine after the expiration date. NOTE: This sheet is a summary. It may not cover all possible information. If you have questions about this medicine, talk to your doctor, pharmacist, or health care provider.    2016, Elsevier/Gold Standard. (2009-10-18 12:57:37)

## 2015-04-05 NOTE — Assessment & Plan Note (Signed)
BP Readings from Last 3 Encounters:  04/05/15 140/80  01/04/15 153/101  10/22/14 120/78    Lab Results  Component Value Date   NA 142 07/19/2014   K 4.2 07/19/2014   CREATININE 0.90 07/19/2014    Assessment: Blood pressure control:  good Progress toward BP goal:    Comments: Patient on Amlodipine 10 mg and reports compliance  Plan: Medications:  continue current medications, Amlodipine 10 mg.  As patient is healthy, will add HCTZ 12.5 mg daily for better BP control, as "normal" remains 120/80. Educational resources provided:   Self management tools provided:   Other plans: recheck 1 month

## 2015-04-08 NOTE — Progress Notes (Signed)
Internal Medicine Clinic Attending  Case discussed with Dr. Taylor at the time of the visit.  We reviewed the resident's history and exam and pertinent patient test results.  I agree with the assessment, diagnosis, and plan of care documented in the resident's note. 

## 2015-04-08 NOTE — Addendum Note (Signed)
Addended by: Lalla Brothers T on: 04/08/2015 04:03 PM   Modules accepted: Level of Service, SmartSet

## 2015-06-07 ENCOUNTER — Encounter: Payer: Self-pay | Admitting: Internal Medicine

## 2015-06-07 ENCOUNTER — Ambulatory Visit (INDEPENDENT_AMBULATORY_CARE_PROVIDER_SITE_OTHER): Payer: Medicare Other | Admitting: Internal Medicine

## 2015-06-07 VITALS — BP 127/75 | HR 90 | Temp 98.0°F | Ht 73.0 in | Wt 166.5 lb

## 2015-06-07 DIAGNOSIS — N529 Male erectile dysfunction, unspecified: Secondary | ICD-10-CM

## 2015-06-07 DIAGNOSIS — H6983 Other specified disorders of Eustachian tube, bilateral: Secondary | ICD-10-CM | POA: Diagnosis not present

## 2015-06-07 DIAGNOSIS — F17210 Nicotine dependence, cigarettes, uncomplicated: Secondary | ICD-10-CM

## 2015-06-07 MED ORDER — FLUTICASONE PROPIONATE 50 MCG/ACT NA SUSP: 2.0000 | Freq: Every day | NASAL | Status: DC

## 2015-06-07 MED ORDER — SILDENAFIL CITRATE 50 MG PO TABS: 50.0000 mg | ORAL_TABLET | Freq: Every day | ORAL | Status: DC | PRN

## 2015-06-07 NOTE — Patient Instructions (Signed)
1. Take Flonase 2 puffs in each nostril every morning for the next 6 weeks.  It can take 6 weeks to start seeing an effect. 2. Do not take Viagra with your other blood pressure medications.  Sildenafil tablets (Viagra) What is this medicine? SILDENAFIL (sil DEN a fil) is used to treat erection problems in men. This medicine may be used for other purposes; ask your health care provider or pharmacist if you have questions. What should I tell my health care provider before I take this medicine? They need to know if you have any of these conditions: -bleeding disorders -eye or vision problems, including a rare inherited eye disease called retinitis pigmentosa -anatomical deformation of the penis, Peyronie's disease, or history of priapism (painful and prolonged erection) -heart disease, angina, a history of heart attack, irregular heart beats, or other heart problems -high or low blood pressure -history of blood diseases, like sickle cell anemia or leukemia -history of stomach bleeding -kidney disease -liver disease -stroke -an unusual or allergic reaction to sildenafil, other medicines, foods, dyes, or preservatives -pregnant or trying to get pregnant -breast-feeding How should I use this medicine? Take this medicine by mouth with a glass of water. Follow the directions on the prescription label. The dose is usually taken 1 hour before sexual activity. You should not take the dose more than once per day. Do not take your medicine more often than directed. Talk to your pediatrician regarding the use of this medicine in children. This medicine is not used in children for this condition. Overdosage: If you think you have taken too much of this medicine contact a poison control center or emergency room at once. NOTE: This medicine is only for you. Do not share this medicine with others. What if I miss a dose? This does not apply. Do not take double or extra doses. What may interact with this  medicine? Do not take this medicine with any of the following medications: -cisapride -methscopolamine nitrate -nitrates like amyl nitrite, isosorbide dinitrate, isosorbide mononitrate, nitroglycerin -nitroprusside -other medicines for erectile dysfunction like avanafil, tadalafil, vardenafil -riociguat -other sildenafil products (Revatio) This medicine may also interact with the following medications: -certain drugs for high blood pressure -certain drugs for the treatment of HIV infection or AIDS -certain drugs used for fungal or yeast infections, like fluconazole, itraconazole, ketoconazole, and voriconazole -cimetidine -erythromycin -rifampin This list may not describe all possible interactions. Give your health care provider a list of all the medicines, herbs, non-prescription drugs, or dietary supplements you use. Also tell them if you smoke, drink alcohol, or use illegal drugs. Some items may interact with your medicine. What should I watch for while using this medicine? If you notice any changes in your vision while taking this drug, call your doctor or health care professional as soon as possible. Stop using this medicine and call your health care provider right away if you have a loss of sight in one or both eyes. Contact your doctor or health care professional right away if you have an erection that lasts longer than 4 hours or if it becomes painful. This may be a sign of a serious problem and must be treated right away to prevent permanent damage. If you experience symptoms of nausea, dizziness, chest pain or arm pain upon initiation of sexual activity after taking this medicine, you should refrain from further activity and call your doctor or health care professional as soon as possible. Do not drink alcohol to excess (examples, 5 glasses of  wine or 5 shots of whiskey) when taking this medicine. When taken in excess, alcohol can increase your chances of getting a headache or getting  dizzy, increasing your heart rate or lowering your blood pressure. Using this medicine does not protect you or your partner against HIV infection (the virus that causes AIDS) or other sexually transmitted diseases. What side effects may I notice from receiving this medicine? Side effects that you should report to your doctor or health care professional as soon as possible: -allergic reactions like skin rash, itching or hives, swelling of the face, lips, or tongue -breathing problems -changes in hearing -changes in vision -chest pain -fast, irregular heartbeat -prolonged or painful erection -seizures Side effects that usually do not require medical attention (report to your doctor or health care professional if they continue or are bothersome): -back pain -dizziness -flushing -headache -indigestion -muscle aches -nausea -stuffy or runny nose This list may not describe all possible side effects. Call your doctor for medical advice about side effects. You may report side effects to FDA at 1-800-FDA-1088. Where should I keep my medicine? Keep out of reach of children. Store at room temperature between 15 and 30 degrees C (59 and 86 degrees F). Throw away any unused medicine after the expiration date. NOTE: This sheet is a summary. It may not cover all possible information. If you have questions about this medicine, talk to your doctor, pharmacist, or health care provider.    2016, Elsevier/Gold Standard. (2013-07-14 13:19:04)

## 2015-06-07 NOTE — Assessment & Plan Note (Signed)
Patient wants refill of his Cialis or Viagra.  Reports his last Rx was for Viagra 10 mg, which did not help.  He stopped using them because they were too expensive.  He denies lightheadedness, dizziness, CP, SOB, falls, or LOC.  He takes his other BP medications at night, but can switch to taking them during the day.  A/P: ED.  Patient cautioned about side effects and combining medications.  He agrees to take his BP medications in the morning. - Viagra 50 mg PRN

## 2015-06-07 NOTE — Assessment & Plan Note (Signed)
Patient reports he had left ear pain 1 week ago, described as pain/pressure, worse in the morning that clears during the day after his ear pops.  It is a/w sore throat and post nasal drip.  His left ear has resolved, but he now has right ear pain.  He denies h/o allergies, but states he has always had sinus problems.  He has used Claritin in the past, but without relief.  On exam, nares injected.  Ear canal and TM normal.  A/P: Allergies.  Given PND and resolution of symptoms after ears pop, will attempt Flonase before trying another antihistamine blocker.  If no relief in 6 weeks, can add on Zyrtec. - Flonase 100 mg each nostril daily

## 2015-06-07 NOTE — Progress Notes (Signed)
Patient ID: [name], male   DOB: [birth year], 65 y.o.   MRN: [id number]   Subjective:   Patient ID: [name] male   DOB: 1950/06/18 65 y.o.   MRN: [id number]  HPI: [name] is a 65 y.o. male with PMH as below, here for eval ear pain.  Please see Problem-Based charting for the status of the patient's chronic medical issues.     Past Medical History  Diagnosis Date  . Tuberculosis at age 19-3  . Sickle cell trait ([place])   . Stroke [facility]) 10/03/08    Right thalamus  . Hypertension     goal < 140/90  . Emphysema     per multiple CXRs  . Snoring     Sleep study 09/02/10 was WNL  . CEREBROVASCULAR ACCIDENT 10/17/2008    Acute CVA of right thalamus 10/03/08 ECHO performed 2/2 CVA 10/09/08, EF 55-65%  Aspirin 325 mg daily and try to work on quitting smoking.    Current Outpatient Prescriptions  Medication Sig Dispense Refill  . albuterol (PROVENTIL HFA;VENTOLIN HFA) 108 (90 BASE) MCG/ACT inhaler Inhale 2 puffs into the lungs every 6 (six) hours as needed for wheezing or shortness of breath. 1 Inhaler 2  . amLODipine (NORVASC) 10 MG tablet Take 1 tablet (10 mg total) by mouth daily. 30 tablet 11  . aspirin 325 MG tablet Take 325 mg by mouth every morning.    . hydrochlorothiazide (HYDRODIURIL) 12.5 MG tablet Take 1 tablet (12.5 mg total) by mouth daily. 30 tablet 3  . meloxicam (MOBIC) 7.5 MG tablet Take 1 tablet (7.5 mg total) by mouth daily as needed for pain. 30 tablet 0  . simvastatin (ZOCOR) 20 MG tablet Take 1 tablet (20 mg total) by mouth daily. 30 tablet 11   No current facility-administered medications for this visit.   Family History  Problem Relation Age of Onset  . Cancer Sister   . Sickle cell anemia Son   . Hypertension Mother    Social History   Social History  . Marital Status: Married    Spouse Name: N/A  . Number of Children: N/A  . Years of Education: N/A   Occupational History  . unemployed    Social History Main Topics  . Smoking  status: Current Every Day Smoker -- 0.30 packs/day for 10 years    Types: Cigarettes  . Smokeless tobacco: None     Comment: Cutting back. DOWN TO 4-5 CIAGARETTES  . Alcohol Use: No  . Drug Use: No     Comment: previous polysubstance abuser, quit x 13 years  . Sexual Activity: Not Asked   Other Topics Concern  . None   Social History Narrative   Lives in [place].  Works as a Pharmacologist for [employer]   Review of Systems: Denies lightheadedness, dizziness, CP, SOB, falls, or LOC.  Endorses sore throat, postnasal drip, and ear pain/pressure. Objective:  Physical Exam: Filed Vitals:   06/07/15 1020  BP: 127/75  Pulse: 90  Temp: 98 F (36.7 C)  TempSrc: Oral  Height: 6\' 1"  (1.854 m)  Weight: 166 lb 8 oz (75.524 kg)  SpO2: 100%   Physical Exam  Constitutional: He is oriented to person, place, and time and well-developed, well-nourished, and in no distress. No distress.  HENT:  Head: Normocephalic and atraumatic.  Nares injected without rhinorrhea or crusting.  External auditory canals clear without drainage or rash.  TMs normal.  Neck: No tracheal deviation present.  Cardiovascular:  Normal rate, regular rhythm and normal heart sounds.   Pulmonary/Chest: Effort normal and breath sounds normal. No stridor. No respiratory distress. He has no wheezes.  Neurological: He is alert and oriented to person, place, and time.  Skin: Skin is warm and dry. He is not diaphoretic.     Assessment & Plan:   Patient and case were discussed with Dr. Lynnae January.  Please refer to Problem Based charting for further documentation.

## 2015-06-11 ENCOUNTER — Telehealth: Payer: Self-pay | Admitting: *Deleted

## 2015-06-11 ENCOUNTER — Other Ambulatory Visit: Payer: Self-pay | Admitting: Internal Medicine

## 2015-06-11 DIAGNOSIS — N529 Male erectile dysfunction, unspecified: Secondary | ICD-10-CM

## 2015-06-11 MED ORDER — SILDENAFIL CITRATE 20 MG PO TABS: ORAL_TABLET | ORAL | Status: DC

## 2015-06-11 NOTE — Progress Notes (Signed)
Internal Medicine Clinic Attending  Case discussed with Dr. Taylor at the time of the visit.  We reviewed the resident's history and exam and pertinent patient test results.  I agree with the assessment, diagnosis, and plan of care documented in the resident's note. 

## 2015-06-11 NOTE — Telephone Encounter (Signed)
Received PA request from pt's pharmacy for his Viagra 50mg  tabs #10. Contacted pt's insurance at [phone] to initiate PA and was informed that pt's plan does not cover any ED meds.   One option available to patient is to have MD write rx for sildenafil 20mg  tabs (Revatio). Insurance will not pay for this medication, but patient can use the good rx card and received up to 30 pills for $21.81 at [facility] located inside of [employer] or for $10 @ [employer] (if he has a Higher education careers adviser).  Attempted to contact pt and let him know, no answer, message left on recorder.Regenia Skeeter, Darlene Cassady4/4/201710:22 AM

## 2015-06-11 NOTE — Telephone Encounter (Signed)
Dr Lovena Le would you consider writing rx for revatio, they are 20mg  tabs, so pt could take 2-3 tabs for ED.  Please advise.Despina Hidden Cassady4/4/201710:56 AM

## 2015-08-02 ENCOUNTER — Ambulatory Visit (INDEPENDENT_AMBULATORY_CARE_PROVIDER_SITE_OTHER): Payer: Medicare Other | Admitting: Internal Medicine

## 2015-08-02 ENCOUNTER — Encounter: Payer: Self-pay | Admitting: Internal Medicine

## 2015-08-02 VITALS — BP 144/100 | HR 85 | Temp 98.2°F | Ht 73.0 in | Wt 167.2 lb

## 2015-08-02 DIAGNOSIS — Z7982 Long term (current) use of aspirin: Secondary | ICD-10-CM | POA: Diagnosis not present

## 2015-08-02 DIAGNOSIS — I10 Essential (primary) hypertension: Secondary | ICD-10-CM | POA: Diagnosis not present

## 2015-08-02 DIAGNOSIS — N529 Male erectile dysfunction, unspecified: Secondary | ICD-10-CM

## 2015-08-02 DIAGNOSIS — F17210 Nicotine dependence, cigarettes, uncomplicated: Secondary | ICD-10-CM | POA: Diagnosis not present

## 2015-08-02 MED ORDER — HYDROCHLOROTHIAZIDE 25 MG PO TABS: 25.0000 mg | ORAL_TABLET | Freq: Every day | ORAL | Status: DC

## 2015-08-02 MED ORDER — SILDENAFIL CITRATE 50 MG PO TABS: 50.0000 mg | ORAL_TABLET | Freq: Every day | ORAL | Status: DC | PRN

## 2015-08-02 NOTE — Assessment & Plan Note (Addendum)
BP Readings from Last 3 Encounters:  08/02/15 144/100  06/07/15 127/75  04/05/15 140/80    Lab Results  Component Value Date   NA 142 07/19/2014   K 4.2 07/19/2014   CREATININE 0.90 07/19/2014    Assessment: Blood pressure control:  fair Progress toward BP goal:    Comments: patient reports worsened diet, with eating mostly at [employer] and [place].  He continues to smoke 3-4 cigarettes daily.  Plan: Medications:  Continue Amlodipine. Increase HCTZ to 25 mg daily Educational resources provided:   Self management tools provided:   Other plans: recheck 6 weeks

## 2015-08-02 NOTE — Patient Instructions (Addendum)
1. Increase the HCTZ to 25 mg daily for better BP control. 2. Cut down on the salt intake, including [place] and [employer].   3. The best thing to do for your blood pressure is to stop smoking.   DASH Eating Plan DASH stands for "Dietary Approaches to Stop Hypertension." The DASH eating plan is a healthy eating plan that has been shown to reduce high blood pressure (hypertension). Additional health benefits may include reducing the risk of type 2 diabetes mellitus, heart disease, and stroke. The DASH eating plan may also help with weight loss. WHAT DO I NEED TO KNOW ABOUT THE DASH EATING PLAN? For the DASH eating plan, you will follow these general guidelines:  Choose foods with a percent daily value for sodium of less than 5% (as listed on the food label).  Use salt-free seasonings or herbs instead of table salt or sea salt.  Check with your health care provider or pharmacist before using salt substitutes.  Eat lower-sodium products, often labeled as "lower sodium" or "no salt added."  Eat fresh foods.  Eat more vegetables, fruits, and low-fat dairy products.  Choose whole grains. Look for the word "whole" as the first word in the ingredient list.  Choose fish and skinless chicken or [place] more often than red meat. Limit fish, poultry, and meat to 6 oz (170 g) each day.  Limit sweets, desserts, sugars, and sugary drinks.  Choose heart-healthy fats.  Limit cheese to 1 oz (28 g) per day.  Eat more home-cooked food and less restaurant, buffet, and fast food.  Limit fried foods.  Cook foods using methods other than frying.  Limit canned vegetables. If you do use them, rinse them well to decrease the sodium.  When eating at a restaurant, ask that your food be prepared with less salt, or no salt if possible. WHAT FOODS CAN I EAT? Seek help from a dietitian for individual calorie needs. Grains Whole grain or whole wheat bread. Brown rice. Whole grain or whole wheat  pasta. Quinoa, bulgur, and whole grain cereals. Low-sodium cereals. Corn or whole wheat flour tortillas. Whole grain cornbread. Whole grain crackers. Low-sodium crackers. Vegetables Fresh or frozen vegetables (raw, steamed, roasted, or grilled). Low-sodium or reduced-sodium tomato and vegetable juices. Low-sodium or reduced-sodium tomato sauce and paste. Low-sodium or reduced-sodium canned vegetables.  Fruits All fresh, canned (in natural juice), or frozen fruits. Meat and Other Protein Products Ground beef (85% or leaner), grass-fed beef, or beef trimmed of fat. Skinless chicken or [place]. Ground chicken or [place]. Pork trimmed of fat. All fish and seafood. Eggs. Dried beans, peas, or lentils. Unsalted nuts and seeds. Unsalted canned beans. Dairy Low-fat dairy products, such as skim or 1% milk, 2% or reduced-fat cheeses, low-fat ricotta or cottage cheese, or plain low-fat yogurt. Low-sodium or reduced-sodium cheeses. Fats and Oils Tub margarines without trans fats. Light or reduced-fat mayonnaise and salad dressings (reduced sodium). Avocado. Safflower, olive, or canola oils. Natural peanut or almond butter. Other Unsalted popcorn and pretzels. The items listed above may not be a complete list of recommended foods or beverages. Contact your dietitian for more options. WHAT FOODS ARE NOT RECOMMENDED? Grains White bread. White pasta. White rice. Refined cornbread. Bagels and croissants. Crackers that contain trans fat. Vegetables Creamed or fried vegetables. Vegetables in a cheese sauce. Regular canned vegetables. Regular canned tomato sauce and paste. Regular tomato and vegetable juices. Fruits Dried fruits. Canned fruit in light or heavy syrup. Fruit juice. Meat and Other Protein Products Fatty  cuts of meat. Ribs, chicken wings, bacon, sausage, bologna, salami, chitterlings, fatback, hot dogs, bratwurst, and packaged luncheon meats. Salted nuts and seeds. Canned beans with salt. Dairy Whole  or 2% milk, cream, half-and-half, and cream cheese. Whole-fat or sweetened yogurt. Full-fat cheeses or blue cheese. Nondairy creamers and whipped toppings. Processed cheese, cheese spreads, or cheese curds. Condiments Onion and garlic salt, seasoned salt, table salt, and sea salt. Canned and packaged gravies. Worcestershire sauce. Tartar sauce. Barbecue sauce. Teriyaki sauce. Soy sauce, including reduced sodium. Steak sauce. Fish sauce. Oyster sauce. Cocktail sauce. Horseradish. Ketchup and mustard. Meat flavorings and tenderizers. Bouillon cubes. Hot sauce. Tabasco sauce. Marinades. Taco seasonings. Relishes. Fats and Oils Butter, stick margarine, lard, shortening, ghee, and bacon fat. Coconut, palm kernel, or palm oils. Regular salad dressings. Other Pickles and olives. Salted popcorn and pretzels. The items listed above may not be a complete list of foods and beverages to avoid. Contact your dietitian for more information. WHERE CAN I FIND MORE INFORMATION? National Heart, Lung, and Blood Institute: travelstabloid.com   This information is not intended to replace advice given to you by your health care provider. Make sure you discuss any questions you have with your health care provider.   Document Released: 02/12/2011 Document Revised: 03/16/2014 Document Reviewed: 12/28/2012 Elsevier Interactive Patient Education [employer].

## 2015-08-02 NOTE — Progress Notes (Signed)
Patient ID: [name], male   DOB: 08-Feb-1951, 65 y.o.   MRN: [id number]    Subjective:   Patient ID: [name] male   DOB: 1950/08/03 65 y.o.   MRN: [id number]  HPI: [name] is a 65 y.o. male with PMH as below, here for HTN recheck.  Please see Problem-Based charting for the status of the patient's chronic medical issues.     Past Medical History  Diagnosis Date  . Tuberculosis at age 75-3  . Sickle cell trait ([place])   . Stroke [facility]) 10/03/08    Right thalamus  . Hypertension     goal < 140/90  . Emphysema     per multiple CXRs  . Snoring     Sleep study 09/02/10 was WNL  . CEREBROVASCULAR ACCIDENT 10/17/2008    Acute CVA of right thalamus 10/03/08 ECHO performed 2/2 CVA 10/09/08, EF 55-65%  Aspirin 325 mg daily and try to work on quitting smoking.    Current Outpatient Prescriptions  Medication Sig Dispense Refill  . albuterol (PROVENTIL HFA;VENTOLIN HFA) 108 (90 BASE) MCG/ACT inhaler Inhale 2 puffs into the lungs every 6 (six) hours as needed for wheezing or shortness of breath. 1 Inhaler 2  . amLODipine (NORVASC) 10 MG tablet Take 1 tablet (10 mg total) by mouth daily. 30 tablet 11  . aspirin 325 MG tablet Take 325 mg by mouth every morning.    . fluticasone (FLONASE) 50 MCG/ACT nasal spray Place 2 sprays into both nostrils daily. 16 g 2  . hydrochlorothiazide (HYDRODIURIL) 12.5 MG tablet Take 1 tablet (12.5 mg total) by mouth daily. 30 tablet 3  . meloxicam (MOBIC) 7.5 MG tablet Take 1 tablet (7.5 mg total) by mouth daily as needed for pain. 30 tablet 0  . sildenafil (REVATIO) 20 MG tablet Take 2 tablets as needed for erectile dysfunction. Do not combine with other blood pressure medications. 20 tablet 0  . sildenafil (VIAGRA) 50 MG tablet Take 1 tablet (50 mg total) by mouth daily as needed for erectile dysfunction. 10 tablet 0  . simvastatin (ZOCOR) 20 MG tablet Take 1 tablet (20 mg total) by mouth daily. 30 tablet 11   No current facility-administered  medications for this visit.   Family History  Problem Relation Age of Onset  . Cancer Sister   . Sickle cell anemia Son   . Hypertension Mother    Social History   Social History  . Marital Status: Married    Spouse Name: N/A  . Number of Children: N/A  . Years of Education: N/A   Occupational History  . unemployed    Social History Main Topics  . Smoking status: Current Every Day Smoker -- 0.30 packs/day for 10 years    Types: Cigarettes  . Smokeless tobacco: None     Comment: Cutting back. DOWN TO 4-5 CIAGARETTES  . Alcohol Use: No  . Drug Use: No     Comment: previous polysubstance abuser, quit x 13 years  . Sexual Activity: Not Asked   Other Topics Concern  . None   Social History Narrative   Lives in [place].  Works as a Pharmacologist for [employer]   Review of Systems: No CP or SOB. Objective:  Physical Exam: Filed Vitals:   08/02/15 1400  BP: 144/100  Pulse: 85  Temp: 98.2 F (36.8 C)  TempSrc: Oral  Height: 6\' 1"  (1.854 m)  Weight: 167 lb 3.2 oz (75.841 kg)  SpO2: 100%  Physical Exam  Constitutional: He is oriented to person, place, and time and well-developed, well-nourished, and in no distress. No distress.  HENT:  Head: Normocephalic and atraumatic.  Eyes: EOM are normal.  Sclera pigmented.  Neck: No tracheal deviation present.  Cardiovascular: Normal rate, regular rhythm and normal heart sounds.   Pulmonary/Chest: Effort normal and breath sounds normal. No stridor. No respiratory distress. He has no wheezes.  Musculoskeletal: He exhibits no edema.  Neurological: He is alert and oriented to person, place, and time.  Skin: Skin is warm and dry. He is not diaphoretic.     Assessment & Plan:   Patient and case were discussed with Dr. Dareen Piano.  Please refer to Problem Based charting for further documentation.

## 2015-08-03 LAB — BMP8+ANION GAP
ANION GAP: 14 mmol/L (ref 10.0–18.0)
BUN/Creatinine Ratio: 10 (ref 10–24)
BUN: 11 mg/dL (ref 8–27)
CALCIUM: 9.3 mg/dL (ref 8.6–10.2)
CO2: 26 mmol/L (ref 18–29)
Chloride: 103 mmol/L (ref 96–106)
Creatinine, Ser: 1.11 mg/dL (ref 0.76–1.27)
GFR calc Af Amer: 80 mL/min/{1.73_m2} (ref >59)
GFR, EST NON AFRICAN AMERICAN: 69 mL/min/{1.73_m2} (ref >59)
Glucose: 83 mg/dL (ref 65–99)
POTASSIUM: 4.5 mmol/L (ref 3.5–5.2)
Sodium: 143 mmol/L (ref 134–144)

## 2015-08-08 ENCOUNTER — Emergency Department (HOSPITAL_COMMUNITY): Payer: Medicare Other

## 2015-08-08 ENCOUNTER — Emergency Department (HOSPITAL_COMMUNITY)
Admission: EM | Admit: 2015-08-08 | Discharge: 2015-08-08 | Disposition: A | Discharge status: Home / Self Care | Payer: Medicare Other | Attending: Emergency Medicine | Admitting: Emergency Medicine

## 2015-08-08 ENCOUNTER — Encounter (HOSPITAL_COMMUNITY): Payer: Self-pay | Admitting: Emergency Medicine

## 2015-08-08 DIAGNOSIS — Z8673 Personal history of transient ischemic attack (TIA), and cerebral infarction without residual deficits: Secondary | ICD-10-CM | POA: Diagnosis not present

## 2015-08-08 DIAGNOSIS — F17210 Nicotine dependence, cigarettes, uncomplicated: Secondary | ICD-10-CM | POA: Diagnosis not present

## 2015-08-08 DIAGNOSIS — I10 Essential (primary) hypertension: Secondary | ICD-10-CM | POA: Insufficient documentation

## 2015-08-08 DIAGNOSIS — R519 Headache, unspecified: Secondary | ICD-10-CM

## 2015-08-08 DIAGNOSIS — N2889 Other specified disorders of kidney and ureter: Secondary | ICD-10-CM | POA: Insufficient documentation

## 2015-08-08 DIAGNOSIS — R079 Chest pain, unspecified: Secondary | ICD-10-CM

## 2015-08-08 DIAGNOSIS — D1803 Hemangioma of intra-abdominal structures: Secondary | ICD-10-CM

## 2015-08-08 DIAGNOSIS — D1800 Hemangioma unspecified site: Secondary | ICD-10-CM | POA: Diagnosis not present

## 2015-08-08 DIAGNOSIS — R911 Solitary pulmonary nodule: Secondary | ICD-10-CM | POA: Insufficient documentation

## 2015-08-08 DIAGNOSIS — R0789 Other chest pain: Secondary | ICD-10-CM | POA: Diagnosis not present

## 2015-08-08 DIAGNOSIS — R51 Headache: Secondary | ICD-10-CM | POA: Diagnosis not present

## 2015-08-08 DIAGNOSIS — Z79899 Other long term (current) drug therapy: Secondary | ICD-10-CM | POA: Insufficient documentation

## 2015-08-08 DIAGNOSIS — J449 Chronic obstructive pulmonary disease, unspecified: Secondary | ICD-10-CM | POA: Diagnosis not present

## 2015-08-08 DIAGNOSIS — Z7982 Long term (current) use of aspirin: Secondary | ICD-10-CM | POA: Insufficient documentation

## 2015-08-08 DIAGNOSIS — R918 Other nonspecific abnormal finding of lung field: Secondary | ICD-10-CM

## 2015-08-08 LAB — BASIC METABOLIC PANEL
ANION GAP: 8 (ref 5–15)
BUN: 17 mg/dL (ref 6–20)
CHLORIDE: 103 mmol/L (ref 101–111)
CO2: 26 mmol/L (ref 22–32)
CREATININE: 1.01 mg/dL (ref 0.61–1.24)
Calcium: 9.1 mg/dL (ref 8.9–10.3)
GFR calc non Af Amer: >60 mL/min (ref >60)
Glucose, Bld: 115 mg/dL — ABNORMAL HIGH (ref 65–99)
POTASSIUM: 3.8 mmol/L (ref 3.5–5.1)
Sodium: 137 mmol/L (ref 135–145)

## 2015-08-08 LAB — CBC
HEMATOCRIT: 41.4 % (ref 39.0–52.0)
HEMOGLOBIN: 14.5 g/dL (ref 13.0–17.0)
MCH: 29 pg (ref 26.0–34.0)
MCHC: 35 g/dL (ref 30.0–36.0)
MCV: 82.8 fL (ref 78.0–100.0)
Platelets: 204 10*3/uL (ref 150–400)
RBC: 5 MIL/uL (ref 4.22–5.81)
RDW: 14.8 % (ref 11.5–15.5)
WBC: 4.8 10*3/uL (ref 4.0–10.5)

## 2015-08-08 LAB — I-STAT TROPONIN, ED: Troponin i, poc: 0 ng/mL (ref 0.00–0.08)

## 2015-08-08 MED ORDER — METOCLOPRAMIDE HCL 5 MG/ML IJ SOLN
10.0000 mg | Freq: Once | INTRAMUSCULAR | Status: AC
  Administered 2015-08-08: 10 mg via INTRAVENOUS
  Filled 2015-08-08: qty 2

## 2015-08-08 MED ORDER — SODIUM CHLORIDE 0.9 % IV BOLUS (SEPSIS)
1000.0000 mL | Freq: Once | INTRAVENOUS | Status: AC
  Administered 2015-08-08: 1000 mL via INTRAVENOUS

## 2015-08-08 MED ORDER — DIPHENHYDRAMINE HCL 50 MG/ML IJ SOLN
25.0000 mg | Freq: Once | INTRAMUSCULAR | Status: AC
  Administered 2015-08-08: 25 mg via INTRAVENOUS
  Filled 2015-08-08: qty 1

## 2015-08-08 MED ORDER — KETOROLAC TROMETHAMINE 30 MG/ML IJ SOLN
30.0000 mg | Freq: Once | INTRAMUSCULAR | Status: AC
  Administered 2015-08-08: 30 mg via INTRAVENOUS
  Filled 2015-08-08: qty 1

## 2015-08-08 MED ORDER — IOPAMIDOL (ISOVUE-370) INJECTION 76%
100.0000 mL | Freq: Once | INTRAVENOUS | Status: AC | PRN
  Administered 2015-08-08: 100 mL via INTRAVENOUS

## 2015-08-08 NOTE — ED Notes (Signed)
Patient transported to CT 

## 2015-08-08 NOTE — ED Notes (Signed)
Central chest pain radiating into back with some nausea over the last couple weeks. Headache on-going over last two weeks also. Does not appear to be in obvious distress in triage. Denies vomiting/diarrhea/SOB

## 2015-08-08 NOTE — ED Notes (Signed)
ED PA at bedside

## 2015-08-08 NOTE — ED Provider Notes (Signed)
CSN: [id number]     Arrival date & time 08/08/15  1316 History   First MD Initiated Contact with Patient 08/08/15 1418     Chief Complaint  Patient presents with  . Chest Pain  . Headache    HPI  MAHKAI PITONES is an 65 y.o. male with history of stroke, HTN who presents to the ED for evaluation of multiple complaints. It is difficult to obtain a clear history and timeline from pt. He states for the past week he has had pain in his chest that he describes as "pressure between the shoulder blades." He states that right now it feels more in his back than in his chest. He states the pain is relieved with rest and worse with movement. He reports associated intermittent nausea but denies emesis. He states he has also had a generalized gradual onset headache that is relieved when he applies pressure to his posterior neck. He states the headache comes back after he stops pressing on a certain spot of his neck. He denies blurry vision, dizziness, weakness, numbness. He also states that whenever he walks outside he is "zapped of energy." He states that he typically is not tired like this. He states he feels like all of his issues are related to his sinuses. He states that if he does not wear a baseball cap his sinuses will start to bother him and that makes his headache and back pain worse. Denies drug or alcohol use. Admits to smoking <1/2 PPD.  Past Medical History  Diagnosis Date  . Tuberculosis at age 50-3  . Sickle cell trait ([place])   . Stroke [facility]) 10/03/08    Right thalamus  . Hypertension     goal < 140/90  . Emphysema     per multiple CXRs  . Snoring     Sleep study 09/02/10 was WNL  . CEREBROVASCULAR ACCIDENT 10/17/2008    Acute CVA of right thalamus 10/03/08 ECHO performed 2/2 CVA 10/09/08, EF 55-65%  Aspirin 325 mg daily and try to work on quitting smoking.    Past Surgical History  Procedure Laterality Date  . Appendectomy  1977  . Vasectomy  1990  . Pacemaker insertion  07/24/08    MDT  Adapta L implanted by Dr Blanch Media   Family History  Problem Relation Age of Onset  . Cancer Sister   . Sickle cell anemia Son   . Hypertension Mother    Social History  Substance Use Topics  . Smoking status: Current Every Day Smoker -- 0.30 packs/day for 10 years    Types: Cigarettes  . Smokeless tobacco: None     Comment: Cutting back. DOWN TO 4-5 CIAGARETTES  . Alcohol Use: No    Review of Systems  All other systems reviewed and are negative.     Allergies  Ace inhibitors  Home Medications   Prior to Admission medications   Medication Sig Start Date End Date Taking? Authorizing Provider  albuterol (PROVENTIL HFA;VENTOLIN HFA) 108 (90 BASE) MCG/ACT inhaler Inhale 2 puffs into the lungs every 6 (six) hours as needed for wheezing or shortness of breath. 07/19/14  Yes Otho Bellows, MD  amLODipine (NORVASC) 10 MG tablet Take 1 tablet (10 mg total) by mouth daily. 01/04/15  Yes Iline Oven, MD  aspirin 325 MG EC tablet Take 325 mg by mouth daily.   Yes Historical Provider, MD  cetirizine (ZYRTEC) 10 MG tablet Take 10 mg by mouth daily as needed for allergies.  Yes Historical Provider, MD  fluticasone (FLONASE) 50 MCG/ACT nasal spray Place 2 sprays into both nostrils daily. 06/07/15  Yes Iline Oven, MD  hydrochlorothiazide (HYDRODIURIL) 25 MG tablet Take 1 tablet (25 mg total) by mouth daily. 08/02/15  Yes Iline Oven, MD  Omega-3 Fatty Acids (FISH OIL PO) Take 1 capsule by mouth daily.   Yes Historical Provider, MD  sildenafil (VIAGRA) 50 MG tablet Take 1 tablet (50 mg total) by mouth daily as needed for erectile dysfunction. 08/02/15  Yes Iline Oven, MD  simvastatin (ZOCOR) 20 MG tablet Take 1 tablet (20 mg total) by mouth daily. 01/04/15  Yes Iline Oven, MD  sildenafil (REVATIO) 20 MG tablet Take 2 tablets as needed for erectile dysfunction. Do not combine with other blood pressure medications. Patient not taking: Reported on 08/08/2015 06/11/15    Iline Oven, MD   BP 114/91 mmHg  Pulse 91  Temp(Src) 98.2 F (36.8 C) (Oral)  Resp 18  SpO2 100% Physical Exam  Constitutional: He is oriented to person, place, and time.  HENT:  Right Ear: External ear normal.  Left Ear: External ear normal.  Nose: Nose normal.  Mouth/Throat: Oropharynx is clear and moist. No oropharyngeal exudate.  Eyes: Conjunctivae and EOM are normal. Pupils are equal, round, and reactive to light.  Neck: Normal range of motion. Neck supple.  Cardiovascular: Normal rate, regular rhythm, normal heart sounds and intact distal pulses.   Pulmonary/Chest: Effort normal and breath sounds normal. No respiratory distress. He has no wheezes. He has no rales.  Abdominal: Soft. Bowel sounds are normal. He exhibits no distension. There is no tenderness. There is no rebound and no guarding.  Musculoskeletal: He exhibits no edema.  No c-spine, t-spine, or L-spine tenderness. No stepoff or deformity  Neurological: He is alert and oriented to person, place, and time. No cranial nerve deficit.  Moves all extremities freely 5/5 strength throughout  Skin: Skin is warm and dry.  Psychiatric: He has a normal mood and affect.  Nursing note and vitals reviewed.   ED Course  Procedures (including critical care time) Labs Review Labs Reviewed  BASIC METABOLIC PANEL - Abnormal; Notable for the following:    Glucose, Bld 115 (*)    All other components within normal limits  CBC  I-STAT TROPOININ, ED    Imaging Review Dg Chest 2 View  08/08/2015  CLINICAL DATA:  Patient has central chest pain and nausea for two weeks. Headaches also. Smoker. Hypertension. EXAM: CHEST  2 VIEW COMPARISON:  03/29/2013 FINDINGS: The lungs are hyperexpanded. There is stable left upper lobe scarring and stable changes of emphysema. Is no evidence of pneumonia or pulmonary edema. No pleural effusion or pneumothorax. Cardiac silhouette is normal in size and configuration. No mediastinal or hilar  masses or evidence of adenopathy. Left anterior chest wall sequential pacemaker is stable. Bony thorax is intact. IMPRESSION: 1. No acute cardiopulmonary disease. 2. COPD and lung scarring, stable from the prior exam. Electronically Signed   By: Lajean Manes M.D.   On: 08/08/2015 13:45   Ct Angio Chest Aorta W/cm &/or Wo/cm  08/08/2015  CLINICAL DATA:  Chest pain. EXAM: CT ANGIOGRAPHY CHEST WITH CONTRAST TECHNIQUE: Multidetector CT imaging of the chest was performed using the standard protocol during bolus administration of intravenous contrast. Multiplanar CT image reconstructions and MIPs were obtained to evaluate the vascular anatomy. CONTRAST:  100 cc of Isovue 370 COMPARISON:  Chest x-ray from earlier today FINDINGS: The trachea and mainstem  bronchi are normal. Severe emphysematous changes are seen within the lungs, most marked in the upper lobes. No pneumothorax. There is a bronchiectasis in a segment of left upper lobe which extends to the pleural surface with associated calcifications but no associated mass. This is consistent with a chronically collapsed segment of left upper lobe. There is a nodule in the right base on series 11, image 94 measuring 4 mm. There is a small nodule in the left upper lobe on series 11, image 27 measuring 4 mm. No other suspicious nodules. No masses identified. Mild atelectasis in the right base. No adenopathy. The patient has a pacemaker. The heart size is normal. No effusions. The thoracic aorta measures 3.6 cm in maximum AP diameter with no aneurysm. No thoracic aortic dissection identified. The study was not tailored to evaluate pulmonary emboli but no central pulmonary emboli are identified. No additional abnormalities seen on the unenhanced images. There is a mass in the upper pole the right kidney measuring 1.8 cm with an attenuation of 25 Hounsfield units on precontrast images and 32 Hounsfield units on arterial phase imaging. There is a 3.2 cm mass in the hepatic  dome with peripheral nodular enhancement on arterial phase imaging most consistent with a hemangioma. Another rounded region of enhancement on series 5, image 103 in the liver measures 8 mm, nonspecific. Another similar finding is seen in the left hepatic lobe on image 100. No other abnormalities in the upper abdomen. Degenerative changes are seen in the spine with no acute bony abnormalities. Review of the MIP images confirms the above findings. IMPRESSION: 1. No thoracic aortic aneurysm or dissection. 2. Severe emphysematous changes in the lungs. Two small 4 mm nodules are seen as described above. See below for follow-up recommendations. 3. There is a segment of left upper lobe exhibiting chronic collapse/volume loss with no associated mass. This is of no acute significance. 4. Multiple enhancing lesions in the liver. The largest demonstrates peripheral nodular enhancement, most consistent with a hemangioma. The smaller masses are nonspecific but may represent smaller hemangiomas with flash early filling. 5. There is a mass in the upper pole of the right kidney which is favored to represent a hyperdense cyst. An ultrasound may more definitively evaluate. Electronically Signed   By: Dorise Bullion III M.D   On: 08/08/2015 16:53   I have personally reviewed and evaluated these images and lab results as part of my medical decision-making.   EKG Interpretation   Date/Time:  Thursday August 08 2015 13:29:40 EDT Ventricular Rate:  83 PR Interval:  149 QRS Duration: 82 QT Interval:  343 QTC Calculation: 403 R Axis:   81 Text Interpretation:  Sinus rhythm Consider right atrial enlargement  Borderline right axis deviation Nonspecific T abnormalities, inferior  leads Inferolateral ST depression and TWI seen on prior EKG in 03/2013  Confirmed by LIU MD, DANA [phone]) on 08/08/2015 1:32:31 PM Also confirmed by  LIU MD, DANA [phone]), editor Gilford Rile, CCT, [place] ([id number])  on 08/08/2015  1:47:31 PM      MDM    Final diagnoses:  Chest pain, unspecified chest pain type  Chronic nonintractable headache, unspecified headache type  Lung nodules  Liver hemangioma  Kidney mass    Pt is an 65 y.o. male with history of HTN who presents to the ED for evaluation of multiple complaints including chest pain that radiates to in between his shoulder blades and headache for the past week. His vital signs are normal and stable. His labs are  unremarkable. EKG nonacute and CXR negative for acute findings. Trop negative. It is difficult to obtain a clear history. He was given a headache cocktail and states his headache is now resolved. However, given report of chest pain that radiates to his thoracic back and pt's unclear history,  Will obtain CT angio to r/o dissection or other emergent etiology  CT negative for dissection. There are two lung nodules, liver masses, and a kidney mass that I discussed with pt and instructed him to discuss with PCP for appropriate follow up monitoring. Pt continues to be hemodynamically stable and resting comfortably in the ED. He is safe for discharge home. Instructed close f/u with PCP. ER return precautions given.    Anne Ng, PA-C 08/08/15 1832  Harvel Quale, MD 08/10/15 [phone]

## 2015-08-08 NOTE — ED Notes (Signed)
MD at bedside. 

## 2015-08-08 NOTE — Progress Notes (Signed)
Internal Medicine Clinic Attending  Case discussed with Dr. Taylor at the time of the visit.  We reviewed the resident's history and exam and pertinent patient test results.  I agree with the assessment, diagnosis, and plan of care documented in the resident's note. 

## 2015-08-08 NOTE — Discharge Instructions (Signed)
You were seen in the emergency room today for evaluation of multiple complaints. Please follow up with your primary care provider within one week.  Your bloodwork today was normal and reassuring. Your CT scan did not show any acute or emergent problems. You do have lesions in your lungs, liver, and kidney that will need repeat imaging in the future for monitoring purposes. Return to the emergency room for new or worsening symptoms.

## 2015-08-28 ENCOUNTER — Encounter: Payer: Self-pay | Admitting: *Deleted

## 2015-09-12 ENCOUNTER — Ambulatory Visit (INDEPENDENT_AMBULATORY_CARE_PROVIDER_SITE_OTHER): Payer: Medicare Other | Admitting: Internal Medicine

## 2015-09-12 VITALS — BP 172/115 | HR 64 | Temp 98.4°F | Ht 73.0 in | Wt 166.2 lb

## 2015-09-12 DIAGNOSIS — R918 Other nonspecific abnormal finding of lung field: Secondary | ICD-10-CM | POA: Diagnosis not present

## 2015-09-12 DIAGNOSIS — F17210 Nicotine dependence, cigarettes, uncomplicated: Secondary | ICD-10-CM

## 2015-09-12 DIAGNOSIS — I10 Essential (primary) hypertension: Secondary | ICD-10-CM | POA: Diagnosis not present

## 2015-09-12 MED ORDER — ASPIRIN EC 81 MG PO TBEC: 81.0000 mg | DELAYED_RELEASE_TABLET | Freq: Every day | ORAL | Status: AC

## 2015-09-12 MED ORDER — CARVEDILOL 3.125 MG PO TABS: 3.1250 mg | ORAL_TABLET | Freq: Two times a day (BID) | ORAL | Status: DC

## 2015-09-12 NOTE — Progress Notes (Signed)
Patient ID: [name], male   DOB: 09/03/1950, 65 y.o.   MRN: [id number]    CC: follow up from ER for hypertension and lung nodules that were found on CT HPI: [name] is a 65 y.o.  Man with PMH noted below who is here after going to the ER where he was found to have 2 lung nodules onC T angio, and for hypertension  Please see Problem List/A&P for the status of the patient's chronic medical problems   Past Medical History  Diagnosis Date  . Tuberculosis at age 70-3  . Sickle cell trait ([place])   . Stroke [facility]) 10/03/08    Right thalamus  . Hypertension     goal < 140/90  . Emphysema     per multiple CXRs  . Snoring     Sleep study 09/02/10 was WNL  . CEREBROVASCULAR ACCIDENT 10/17/2008    Acute CVA of right thalamus 10/03/08 ECHO performed 2/2 CVA 10/09/08, EF 55-65%  Aspirin 325 mg daily and try to work on quitting smoking.     Review of Systems:  Constitutional: Negative for fever, chills, weight loss and malaise/fatigue.  Respiratory: Negative for cough, shortness of breath or hemoptysis  Gastrointestinal: Negative for heartburn, nausea, vomiting, hematochezia  GU: negative for dysuria or hematuria.  Neurological: Negative for neurologic weakness.   Physical Exam: Filed Vitals:   09/12/15 1456  BP: 148/107  Pulse: 82  Temp: 98.4 F (36.9 C)  TempSrc: Oral  Height: 6\' 1"  (1.854 m)  Weight: 166 lb 3.2 oz (75.388 kg)  SpO2: 100%    General: A&O, in NAD HEENT: PERRLA Neck: supple, midline trachea CV: RRR, normal s1, s2, no m/r/g,  Resp: equal and symmetric breath sounds, no wheezing or crackles  Abdomen: soft, nontender, nondistended, +BS   Assessment & Plan:   See encounters tab for problem based medical decision making. Patient discussed with Dr. Dareen Piano

## 2015-09-12 NOTE — Patient Instructions (Addendum)
Thank you for your visit today We will order a CT scan in 3 months and office will call you  Please take Carvedilol twice a day  Please follow up in 2 weeks for blood pressure re-check

## 2015-09-12 NOTE — Assessment & Plan Note (Signed)
Patient is here after his ER visit on 6/1. CT angio was done to r/o dissection. On that read, two 4 mm nodules were found in the right lung. He denies constitutional symptoms. Denies hemoptysis. Denies dyspnea. Feels very well. Has no desire to cut down smoking and smokes 1/2 ppd since the age of 79.   Plan -Normally for solitary lung nodule <6 mm, no follow up is required, but patient had 2 lung nodules. He also has about 25 pack year smoking history. -Ordered CT lung in 3 months -follow up with PCP

## 2015-09-12 NOTE — Assessment & Plan Note (Signed)
Patient's repeat blood pressure was also elevated at 172/115 and HR 72.  He is compliant with norvasc 10 mg, hctz 25 mg daily. He is allergic to ACEi.    -Counseled on smoking cessation and its effects on blood pressure -Added Coreg 3.125 mg BID -If persistently elevated, may need to investigate secondary causes of hypertension -Follow up in 2 weeks

## 2015-09-16 NOTE — Progress Notes (Signed)
Internal Medicine Clinic Attending  Case discussed with Dr. Saraiya at the time of the visit.  We reviewed the resident's history and exam and pertinent patient test results.  I agree with the assessment, diagnosis, and plan of care documented in the resident's note.  

## 2015-09-26 ENCOUNTER — Ambulatory Visit (INDEPENDENT_AMBULATORY_CARE_PROVIDER_SITE_OTHER): Payer: Medicare Other | Admitting: Internal Medicine

## 2015-09-26 ENCOUNTER — Emergency Department (HOSPITAL_COMMUNITY)
Admission: EM | Admit: 2015-09-26 | Discharge: 2015-09-26 | Disposition: A | Discharge status: Home / Self Care | Payer: Medicare Other | Attending: Dermatology | Admitting: Dermatology

## 2015-09-26 ENCOUNTER — Encounter: Payer: Self-pay | Admitting: Internal Medicine

## 2015-09-26 ENCOUNTER — Encounter (HOSPITAL_COMMUNITY): Payer: Self-pay

## 2015-09-26 VITALS — BP 132/102 | HR 85 | Temp 98.1°F | Wt 166.6 lb

## 2015-09-26 DIAGNOSIS — Z79899 Other long term (current) drug therapy: Secondary | ICD-10-CM | POA: Insufficient documentation

## 2015-09-26 DIAGNOSIS — I10 Essential (primary) hypertension: Secondary | ICD-10-CM | POA: Insufficient documentation

## 2015-09-26 DIAGNOSIS — Z5321 Procedure and treatment not carried out due to patient leaving prior to being seen by health care provider: Secondary | ICD-10-CM | POA: Insufficient documentation

## 2015-09-26 DIAGNOSIS — Z8673 Personal history of transient ischemic attack (TIA), and cerebral infarction without residual deficits: Secondary | ICD-10-CM | POA: Diagnosis not present

## 2015-09-26 DIAGNOSIS — Z7982 Long term (current) use of aspirin: Secondary | ICD-10-CM | POA: Insufficient documentation

## 2015-09-26 DIAGNOSIS — R918 Other nonspecific abnormal finding of lung field: Secondary | ICD-10-CM

## 2015-09-26 DIAGNOSIS — Z720 Tobacco use: Secondary | ICD-10-CM | POA: Diagnosis not present

## 2015-09-26 MED ORDER — NICOTINE 14 MG/24HR TD PT24: 14.0000 mg | MEDICATED_PATCH | TRANSDERMAL | Status: DC

## 2015-09-26 NOTE — Assessment & Plan Note (Signed)
Counseled on smoking cessation. He has been smoking since the age of 31, and says it is difficult for him. Currently he smokes about 4 cigs per day  He would like to try the nicotine patches  -given nicotine patches  -reassess smoking status at every visit -ordered CT lung- since multiple lung nodules were already found on the ct angiogram

## 2015-09-26 NOTE — Progress Notes (Signed)
Patient ID: [name], male   DOB: [birth year], 65 y.o.   MRN: [id number]    CC: hypertension HPI: [name] is a 65 y.o. man with PMH noted below here to follow up for hypertension, smoking, lung nodules  Please see Problem List/A&P for the status of the patient's chronic medical problems   Past Medical History  Diagnosis Date  . Tuberculosis at age 65-3  . Sickle cell trait ([place])   . Stroke [facility]) 10/03/08    Right thalamus  . Hypertension     goal < 140/90  . Emphysema     per multiple CXRs  . Snoring     Sleep study 09/02/10 was WNL  . CEREBROVASCULAR ACCIDENT 10/17/2008    Acute CVA of right thalamus 10/03/08 ECHO performed 2/2 CVA 10/09/08, EF 55-65%  Aspirin 325 mg daily and try to work on quitting smoking.     Review of Systems:  Negative, except as per HPI  Physical Exam: Filed Vitals:   09/26/15 1332  BP: 132/102  Pulse: 85  Temp: 98.1 F (36.7 C)  TempSrc: Oral  Weight: 166 lb 9.6 oz (75.569 kg)  SpO2: 100%    General: A&O, in NAD CV: RRR, normal s1, s2, no m/r/g Resp: equal and symmetric breath sounds, no wheezing or crackles  Abdomen: soft, nontender, nondistended, +BS    Assessment & Plan:   See encounters tab for problem based medical decision making. Patient discussed with Dr. Evette Doffing

## 2015-09-26 NOTE — Patient Instructions (Signed)
Thank you for your visit today For your high blood pressure: Please take amlodipine and hydrochlorothiazide daily, and the carvedilol (coreg) twice a day. So to repeat, you are on 3 blood pressure medications.  As we talked about, please work on quitting smoking. I have prescribed you the nicotine patches.  Please also continue your simvastatin, and the aspirin. Please follow up in 2 weeks

## 2015-09-26 NOTE — ED Notes (Signed)
Pt was placed on carvedilol on the 6th and his blood pressure has been elevated since then and he's had a slight headache. Tonight he didn't take any and he has an appt tomorrow

## 2015-09-26 NOTE — Assessment & Plan Note (Signed)
-  ordered CT lung to be performed in October

## 2015-09-26 NOTE — Assessment & Plan Note (Signed)
Patient's blood pressure was 132/102 and HR 85. Looking back in his chart, his diastolic has always been on the higher side. 2 weeks ago, we added coreg in addition to the Hctz and the amlodipine. However, patient thought that the coreg was in substitution for the CCB and hctz and so he stopped taking the amlodipine and the hctz. Since then he had some headaches, but no other symptoms. He went to the ER yesterday who asked him to come to the clinic. He continues to smoke- 4 cigs per day.   -Plan -resume amlodipine 10 mg daily -resume hctz 25 mg daily -continue coreg 3.125 mg bid -counseled on smoking cessation- he would like to try nicotine patch -follow up in 2weeks

## 2015-09-27 NOTE — Progress Notes (Signed)
Internal Medicine Clinic Attending  Case discussed with Dr. Saraiya at the time of the visit.  We reviewed the resident's history and exam and pertinent patient test results.  I agree with the assessment, diagnosis, and plan of care documented in the resident's note.  

## 2015-10-08 ENCOUNTER — Ambulatory Visit (HOSPITAL_COMMUNITY): Payer: Medicare Other

## 2015-10-09 ENCOUNTER — Telehealth: Payer: Self-pay | Admitting: Internal Medicine

## 2015-10-09 NOTE — Telephone Encounter (Signed)
APT. REMINDER CALL, LMTCB °

## 2015-10-10 ENCOUNTER — Encounter: Payer: Medicare Other | Admitting: Internal Medicine

## 2015-10-15 NOTE — Progress Notes (Addendum)
Electrophysiology Office Note Date: 10/16/2015  ID:  [name], DOB December 01, 1950, MRN [id number]  PCP: Boyd Kerbs, DO Electrophysiologist: Rayann Heman  CC: Pacemaker follow-up  [name] is a 65 y.o. male seen today for Dr Rayann Heman.  He presents today for routine electrophysiology followup.  Since last being seen in our clinic, the patient reports doing very well.  He denies chest pain, palpitations, dyspnea, PND, orthopnea, nausea, vomiting, dizziness, syncope, edema, weight gain, or early satiety.  Device History: MDT dual chamber PPM implanted 2010 for sinus node dysfunction   Past Medical History:  Diagnosis Date  . CEREBROVASCULAR ACCIDENT 10/17/2008   Acute CVA of right thalamus 10/03/08 ECHO performed 2/2 CVA 10/09/08, EF 55-65%  Aspirin 325 mg daily and try to work on quitting smoking.   . Emphysema    per multiple CXRs  . Hypertension    goal < 140/90  . Sickle cell trait ([place])   . Snoring    Sleep study 09/02/10 was WNL  . Stroke [facility]) 10/03/08   Right thalamus  . Tuberculosis at age 45-3   Past Surgical History:  Procedure Laterality Date  . APPENDECTOMY  1977  . PACEMAKER INSERTION  07/24/08   MDT Adapta L implanted by Dr Blanch Media  . VASECTOMY  1990    Current Outpatient Prescriptions  Medication Sig Dispense Refill  . albuterol (PROVENTIL HFA;VENTOLIN HFA) 108 (90 BASE) MCG/ACT inhaler Inhale 2 puffs into the lungs every 6 (six) hours as needed for wheezing or shortness of breath. 1 Inhaler 2  . amLODipine (NORVASC) 10 MG tablet Take 1 tablet (10 mg total) by mouth daily. 30 tablet 11  . aspirin EC 81 MG tablet Take 1 tablet (81 mg total) by mouth daily. 150 tablet 2  . carvedilol (COREG) 3.125 MG tablet Take 1 tablet (3.125 mg total) by mouth 2 (two) times daily with a meal. 60 tablet 1  . cetirizine (ZYRTEC) 10 MG tablet Take 10 mg by mouth daily as needed for allergies.    . fluticasone (FLONASE) 50 MCG/ACT nasal spray Place 2 sprays into both  nostrils daily. 16 g 2  . hydrochlorothiazide (HYDRODIURIL) 25 MG tablet Take 1 tablet (25 mg total) by mouth daily. 30 tablet 3  . nicotine (NICODERM CQ - DOSED IN MG/24 HOURS) 14 mg/24hr patch Place 1 patch (14 mg total) onto the skin daily. 30 patch 0  . Omega-3 Fatty Acids (FISH OIL PO) Take 1 capsule by mouth daily.    . sildenafil (REVATIO) 20 MG tablet Take 2 tablets as needed for erectile dysfunction. Do not combine with other blood pressure medications. 20 tablet 0  . sildenafil (VIAGRA) 50 MG tablet Take 1 tablet (50 mg total) by mouth daily as needed for erectile dysfunction. 10 tablet 0  . simvastatin (ZOCOR) 20 MG tablet Take 1 tablet (20 mg total) by mouth daily. 30 tablet 11   No current facility-administered medications for this visit.     Allergies:   Ace inhibitors   Social History: Social History   Social History  . Marital status: Married    Spouse name: N/A  . Number of children: N/A  . Years of education: N/A   Occupational History  . unemployed    Social History Main Topics  . Smoking status: Current Every Day Smoker    Packs/day: 0.30    Years: 10.00    Types: Cigarettes  . Smokeless tobacco: Not on file     Comment: Cutting back. DOWN  TO 4-5 CIAGARETTES  . Alcohol use No  . Drug use: No     Comment: previous polysubstance abuser, quit x 13 years  . Sexual activity: Not on file   Other Topics Concern  . Not on file   Social History Narrative   Lives in [place].  Works as a Pharmacologist for [employer]    Family History: Family History  Problem Relation Age of Onset  . Cancer Sister   . Hypertension Mother   . Sickle cell anemia Son      Review of Systems: All other systems reviewed and are otherwise negative except as noted above.   Physical Exam: VS:  BP 118/80   Pulse 80   Ht 6\' 1"  (1.854 m)   Wt 168 lb (76.2 kg)   BMI 22.16 kg/m  , BMI Body mass index is 22.16 kg/m.  GEN- The patient is well appearing,  alert and oriented x 3 today.   HEENT: normocephalic, atraumatic; sclera clear, conjunctiva pink; hearing intact; oropharynx clear; neck supple, no JVP Lymph- no cervical lymphadenopathy Lungs- Clear to ausculation bilaterally, normal work of breathing.  No wheezes, rales, rhonchi Heart- Regular rate and rhythm, no murmurs, rubs or gallops, PMI not laterally displaced GI- soft, non-tender, non-distended, bowel sounds present, no hepatosplenomegaly Extremities- no clubbing, cyanosis, or edema; DP/PT/radial pulses 2+ bilaterally MS- no significant deformity or atrophy Skin- warm and dry, no rash or lesion; PPM pocket well healed Psych- euthymic mood, full affect Neuro- strength and sensation are intact  PPM Interrogation- reviewed in detail today,  See PACEART report  EKG:  EKG is not ordered today.  Recent Labs: 08/08/2015: BUN 17; Creatinine, Ser 1.01; Hemoglobin 14.5; Platelets 204; Potassium 3.8; Sodium 137   Wt Readings from Last 3 Encounters:  10/16/15 168 lb (76.2 kg)  09/26/15 166 lb 9.6 oz (75.6 kg)  09/12/15 166 lb 3.2 oz (75.4 kg)     Other studies Reviewed: Additional studies/ records that were reviewed today include: Dr Jackalyn Lombard office notes  Assessment and Plan:  1.  Sick sinus syndrome Normal PPM function See Claudia Desanctis Art report No changes today Importance of compliance with remote monitoring discussed with patient today.  Pt given MyCarelink Smart monitor today and app installed on phone while in office.   2. HTN Stable No change required today  3. Tobacco abuse Pt currently smoking, but aware of need to quit  4. Hyperlipidemia Management deferred to PCP  5.  Atrial tachycardia Episodes short and asymptomatic. Will follow for now.     Current medicines are reviewed at length with the patient today.   The patient does not have concerns regarding his medicines.  The following changes were made today:  none  Labs/ tests ordered today include: none No orders  of the defined types were placed in this encounter.    Disposition:   Follow up with Carelink transmissions, Dr Rayann Heman 1 year    Signed, Chanetta Marshall, NP 10/16/2015 8:47 AM  [place] [address] [phone] (office) [phone] (fax)

## 2015-10-16 ENCOUNTER — Ambulatory Visit (INDEPENDENT_AMBULATORY_CARE_PROVIDER_SITE_OTHER): Payer: Medicare Other | Admitting: Nurse Practitioner

## 2015-10-16 ENCOUNTER — Encounter: Payer: Self-pay | Admitting: Internal Medicine

## 2015-10-16 ENCOUNTER — Encounter: Payer: Self-pay | Admitting: Nurse Practitioner

## 2015-10-16 VITALS — BP 118/80 | HR 80 | Ht 73.0 in | Wt 168.0 lb

## 2015-10-16 DIAGNOSIS — I495 Sick sinus syndrome: Secondary | ICD-10-CM

## 2015-10-16 DIAGNOSIS — I10 Essential (primary) hypertension: Secondary | ICD-10-CM | POA: Diagnosis not present

## 2015-10-16 DIAGNOSIS — Z720 Tobacco use: Secondary | ICD-10-CM | POA: Diagnosis not present

## 2015-10-16 DIAGNOSIS — E785 Hyperlipidemia, unspecified: Secondary | ICD-10-CM | POA: Diagnosis not present

## 2015-10-16 NOTE — Patient Instructions (Addendum)
Medication Instructions:   Your physician recommends that you continue on your current medications as directed. Please refer to the Current Medication list given to you today.   If you need a refill on your cardiac medications before your next appointment, please call your pharmacy.  Labwork:  NONE ORDER TODAY    Testing/Procedures: NONE ORDER TODAY '   Follow-Up:  Your physician wants you to follow-up in: [place] will receive a reminder letter in the mail two months in advance. If you don't receive a letter, please call our office to schedule the follow-up appointment.  Remote monitoring is used to monitor your Pacemaker of ICD from home. This monitoring reduces the number of office visits required to check your device to one time per year. It allows [place] to keep an eye on the functioning of your device to ensure it is working properly. You are scheduled for a device check from home on .  01/16/2016..You may send your transmission at any time that day. If you have a wireless device, the transmission will be sent automatically. After your physician reviews your transmission, you will receive a postcard with your next transmission date.     Any Other Special Instructions Will Be Listed Below (If Applicable).

## 2015-10-17 LAB — CUP PACEART INCLINIC DEVICE CHECK
Date Time Interrogation Session: 20170809122605
Implantable Lead Implant Date: 20101105
Implantable Lead Location: 753859
MDC IDC LEAD IMPLANT DT: 20101105
MDC IDC LEAD LOCATION: 753860

## 2015-12-11 ENCOUNTER — Ambulatory Visit (INDEPENDENT_AMBULATORY_CARE_PROVIDER_SITE_OTHER): Payer: Medicare Other | Admitting: *Deleted

## 2015-12-11 DIAGNOSIS — Z23 Encounter for immunization: Secondary | ICD-10-CM | POA: Diagnosis present

## 2015-12-20 ENCOUNTER — Ambulatory Visit (HOSPITAL_COMMUNITY): Admission: RE | Admit: 2015-12-20 | Payer: Medicare Other | Source: Ambulatory Visit

## 2015-12-25 ENCOUNTER — Telehealth: Payer: Self-pay | Admitting: Internal Medicine

## 2015-12-25 NOTE — Telephone Encounter (Signed)
APT. REMINDER CALL, LMTCB °

## 2015-12-26 ENCOUNTER — Encounter: Payer: Medicare Other | Admitting: Internal Medicine

## 2016-01-01 ENCOUNTER — Encounter: Payer: Self-pay | Admitting: *Deleted

## 2016-01-14 ENCOUNTER — Other Ambulatory Visit: Payer: Self-pay | Admitting: *Deleted

## 2016-01-14 ENCOUNTER — Telehealth: Payer: Self-pay

## 2016-01-14 DIAGNOSIS — Z720 Tobacco use: Secondary | ICD-10-CM

## 2016-01-14 DIAGNOSIS — E7849 Other hyperlipidemia: Secondary | ICD-10-CM

## 2016-01-14 DIAGNOSIS — I10 Essential (primary) hypertension: Secondary | ICD-10-CM

## 2016-01-14 DIAGNOSIS — H6983 Other specified disorders of Eustachian tube, bilateral: Secondary | ICD-10-CM

## 2016-01-14 MED ORDER — CARVEDILOL 3.125 MG PO TABS: 3.1250 mg | ORAL_TABLET | Freq: Two times a day (BID) | ORAL | 0 refills | Status: DC

## 2016-01-14 MED ORDER — HYDROCHLOROTHIAZIDE 25 MG PO TABS: 25.0000 mg | ORAL_TABLET | Freq: Every day | ORAL | 0 refills | Status: DC

## 2016-01-14 MED ORDER — ALBUTEROL SULFATE HFA 108 (90 BASE) MCG/ACT IN AERS: 2.0000 | INHALATION_SPRAY | Freq: Four times a day (QID) | RESPIRATORY_TRACT | 2 refills | Status: DC | PRN

## 2016-01-14 MED ORDER — AMLODIPINE BESYLATE 10 MG PO TABS: 10.0000 mg | ORAL_TABLET | Freq: Every day | ORAL | 0 refills | Status: DC

## 2016-01-14 MED ORDER — CETIRIZINE HCL 10 MG PO TABS: 10.0000 mg | ORAL_TABLET | Freq: Every day | ORAL | 0 refills | Status: DC | PRN

## 2016-01-14 MED ORDER — FLUTICASONE PROPIONATE 50 MCG/ACT NA SUSP: 2.0000 | Freq: Every day | NASAL | 2 refills | Status: DC

## 2016-01-14 MED ORDER — SIMVASTATIN 20 MG PO TABS: 20.0000 mg | ORAL_TABLET | Freq: Every day | ORAL | 0 refills | Status: DC

## 2016-01-14 NOTE — Telephone Encounter (Signed)
Will need visit with me sometime in the next 3 months for further refills

## 2016-01-14 NOTE — Telephone Encounter (Signed)
Requesting all meds to be filled.

## 2016-01-14 NOTE — Telephone Encounter (Signed)
Request sent 

## 2016-01-16 ENCOUNTER — Encounter: Payer: Medicare Other | Admitting: *Deleted

## 2016-01-16 ENCOUNTER — Telehealth: Payer: Self-pay | Admitting: Cardiology

## 2016-01-16 ENCOUNTER — Encounter: Payer: Medicare Other | Admitting: Internal Medicine

## 2016-01-16 NOTE — Telephone Encounter (Signed)
Attempted to confirm remote transmission with pt. No answer and was unable to leave a message.   

## 2016-01-17 ENCOUNTER — Encounter: Payer: Self-pay | Admitting: Cardiology

## 2016-03-18 ENCOUNTER — Telehealth: Payer: Self-pay | Admitting: Internal Medicine

## 2016-03-18 NOTE — Telephone Encounter (Signed)
APT. REMINDER CALL, LMTCB °

## 2016-03-19 ENCOUNTER — Encounter: Payer: Medicare Other | Admitting: Internal Medicine

## 2016-03-19 ENCOUNTER — Encounter: Payer: Self-pay | Admitting: Internal Medicine

## 2016-03-31 ENCOUNTER — Ambulatory Visit (INDEPENDENT_AMBULATORY_CARE_PROVIDER_SITE_OTHER): Payer: Medicare Other | Admitting: *Deleted

## 2016-03-31 DIAGNOSIS — Z950 Presence of cardiac pacemaker: Secondary | ICD-10-CM

## 2016-03-31 NOTE — Progress Notes (Signed)
Pt seen for help to set-up home monitor, pt stated that he could not find his home monitor. Helped pt download My Carelink Smart app, number given to device clinic for pt to call and help pair monitor when he finds his home monitor.

## 2016-04-21 DIAGNOSIS — I700 Atherosclerosis of aorta: Secondary | ICD-10-CM | POA: Insufficient documentation

## 2016-04-21 NOTE — Progress Notes (Signed)
[facility] Subjective:  HPI: [name] is a 66 y.o. male who presents for overdue follow up for HTN.  I have recently been assigned as his PCP.  He has a history of missed appointments, and noncompliance.   I reviewed his problem list with him as well as his current medications have have updated them in the EHR.  He reports he is taking his medications.     Review of Systems: Per HPI Objective:  Physical Exam: Vitals:   04/23/16 0900 04/23/16 0947  BP: (!) 138/101 (!) 132/92  Pulse: 87   Temp: 97.8 F (36.6 C)   TempSrc: Oral   SpO2: 100%   Weight: 176 lb 1.6 oz (79.9 kg)   Height: 6\' 1"  (1.854 m)    Physical Exam  Constitutional: He is well-developed, well-nourished, and in no distress.  Cardiovascular: Normal rate and regular rhythm.   Pulmonary/Chest: Effort normal and breath sounds normal.  Abdominal: Soft. Bowel sounds are normal.  Musculoskeletal: He exhibits no edema.  Skin: Skin is warm and dry.  Nursing note and vitals reviewed.   Assessment & Plan:  Multiple lung nodules on CT CT has already been ordered but has not been completed.  I informed him of this, he replies "your the doctor" which was a frequent theme during the encounter.  As his doctor I recommended that he complete the CT.  Tobacco abuse He reports he is down to smoking 3-4 cigarettes a day. Would like to quit completely, he thinks he did this on his own without pharmacologic or nicotine replacement therapy.  Assessment tobacco abuse  Plan Discussed the importance of smoking cessation for 2 minutes. He currently does not want pharmacologic assistance.  Hyperlipidemia History of present illness: He is been taking simvastatin 20 mg daily. He denies any side effects  Assessment: Hyperlipidemia  Plan: Review of previous lipid panels shows his LDL has not been at goal. He reports he is fasting today therefore will recheck his lipid panel and expect to change him  to a high intensity statin I have informed him of this plan.  Abdominal aortic atherosclerosis (HCC) Abdominal aortic atherosclerosis was noted on his CT scan. He does have a history of a CVA and does take 81 mg of aspirin as well as 20 mg of simvastatin.  Assessment: Abdominal aortic atherosclerosis  Plan: Repeat lipid panel likely change to a high intensity statin.  Essential hypertension History of present illness: He reports she is taking hydrochlorothiazide 25 mg daily as well as amlodipine 10 mg daily. He did not take his blood pressure medications this morning.  Assessment: Essential hypertension not at goal  Plan: On manual repeat his blood pressure was closer to goal range. I think I will continue him on his current medications today I've reinforced a low salt diet as well as increasing exercise and smoking cessation.   Medications Ordered No orders of the defined types were placed in this encounter.  Other Orders Orders Placed This Encounter  Procedures  . Pneumococcal polysaccharide vaccine 23-valent greater than or equal to 2yo subcutaneous/IM  . CMP14 + Anion Gap    Order Specific Question:   Has the patient fasted?    Answer:   Yes  . Lipid Profile    Order Specific Question:   Has the patient fasted?    Answer:   Yes  . Hepatitis C antibody   Follow Up: Return in about 3 months (around 07/21/2016).

## 2016-04-23 ENCOUNTER — Ambulatory Visit (INDEPENDENT_AMBULATORY_CARE_PROVIDER_SITE_OTHER): Payer: Medicare Other | Admitting: Internal Medicine

## 2016-04-23 ENCOUNTER — Encounter: Payer: Self-pay | Admitting: Internal Medicine

## 2016-04-23 VITALS — BP 132/92 | HR 87 | Temp 97.8°F | Ht 73.0 in | Wt 176.1 lb

## 2016-04-23 DIAGNOSIS — I700 Atherosclerosis of aorta: Secondary | ICD-10-CM | POA: Diagnosis not present

## 2016-04-23 DIAGNOSIS — Z79899 Other long term (current) drug therapy: Secondary | ICD-10-CM

## 2016-04-23 DIAGNOSIS — Z7982 Long term (current) use of aspirin: Secondary | ICD-10-CM | POA: Diagnosis not present

## 2016-04-23 DIAGNOSIS — Z8673 Personal history of transient ischemic attack (TIA), and cerebral infarction without residual deficits: Secondary | ICD-10-CM | POA: Diagnosis not present

## 2016-04-23 DIAGNOSIS — Z720 Tobacco use: Secondary | ICD-10-CM

## 2016-04-23 DIAGNOSIS — E784 Other hyperlipidemia: Secondary | ICD-10-CM | POA: Diagnosis not present

## 2016-04-23 DIAGNOSIS — Z23 Encounter for immunization: Secondary | ICD-10-CM

## 2016-04-23 DIAGNOSIS — F17210 Nicotine dependence, cigarettes, uncomplicated: Secondary | ICD-10-CM | POA: Diagnosis not present

## 2016-04-23 DIAGNOSIS — I10 Essential (primary) hypertension: Secondary | ICD-10-CM | POA: Diagnosis not present

## 2016-04-23 DIAGNOSIS — Z716 Tobacco abuse counseling: Secondary | ICD-10-CM | POA: Diagnosis not present

## 2016-04-23 DIAGNOSIS — E7849 Other hyperlipidemia: Secondary | ICD-10-CM

## 2016-04-23 DIAGNOSIS — Z9119 Patient's noncompliance with other medical treatment and regimen: Secondary | ICD-10-CM | POA: Diagnosis not present

## 2016-04-23 DIAGNOSIS — Z0000 Encounter for general adult medical examination without abnormal findings: Secondary | ICD-10-CM

## 2016-04-23 DIAGNOSIS — E785 Hyperlipidemia, unspecified: Secondary | ICD-10-CM | POA: Diagnosis not present

## 2016-04-23 DIAGNOSIS — R918 Other nonspecific abnormal finding of lung field: Secondary | ICD-10-CM

## 2016-04-23 DIAGNOSIS — Z1159 Encounter for screening for other viral diseases: Secondary | ICD-10-CM | POA: Diagnosis not present

## 2016-04-23 NOTE — Assessment & Plan Note (Signed)
Abdominal aortic atherosclerosis was noted on his CT scan. He does have a history of a CVA and does take 81 mg of aspirin as well as 20 mg of simvastatin.  Assessment: Abdominal aortic atherosclerosis  Plan: Repeat lipid panel likely change to a high intensity statin.

## 2016-04-23 NOTE — Assessment & Plan Note (Signed)
History of present illness: He reports she is taking hydrochlorothiazide 25 mg daily as well as amlodipine 10 mg daily. He did not take his blood pressure medications this morning.  Assessment: Essential hypertension not at goal  Plan: On manual repeat his blood pressure was closer to goal range. I think I will continue him on his current medications today I've reinforced a low salt diet as well as increasing exercise and smoking cessation.

## 2016-04-23 NOTE — Assessment & Plan Note (Addendum)
CT has already been ordered but has not been completed.  I informed him of this, he replies "your the doctor" which was a frequent theme during the encounter.  As his doctor I recommended that he complete the CT.

## 2016-04-23 NOTE — Assessment & Plan Note (Signed)
He reports he is down to smoking 3-4 cigarettes a day. Would like to quit completely, he thinks he did this on his own without pharmacologic or nicotine replacement therapy.  Assessment tobacco abuse  Plan Discussed the importance of smoking cessation for 2 minutes. He currently does not want pharmacologic assistance.

## 2016-04-23 NOTE — Patient Instructions (Addendum)
I want you to get the CT scan of your lungs completed.  Please make sure you are taking you blood pressure medications regularly.   I will call you with the results of your bloodwork.

## 2016-04-23 NOTE — Assessment & Plan Note (Signed)
History of present illness: He is been taking simvastatin 20 mg daily. He denies any side effects  Assessment: Hyperlipidemia  Plan: Review of previous lipid panels shows his LDL has not been at goal. He reports he is fasting today therefore will recheck his lipid panel and expect to change him to a high intensity statin I have informed him of this plan.

## 2016-04-24 LAB — CMP14 + ANION GAP
A/G RATIO: 1.7 (ref 1.2–2.2)
ALK PHOS: 104 IU/L (ref 39–117)
ALT: 19 IU/L (ref 0–44)
AST: 25 IU/L (ref 0–40)
Albumin: 4.3 g/dL (ref 3.6–4.8)
Anion Gap: 17 mmol/L (ref 10.0–18.0)
BILIRUBIN TOTAL: 0.4 mg/dL (ref 0.0–1.2)
BUN/Creatinine Ratio: 11 (ref 10–24)
BUN: 12 mg/dL (ref 8–27)
CALCIUM: 9.3 mg/dL (ref 8.6–10.2)
CHLORIDE: 98 mmol/L (ref 96–106)
CO2: 25 mmol/L (ref 18–29)
Creatinine, Ser: 1.11 mg/dL (ref 0.76–1.27)
GFR calc Af Amer: 79 mL/min/{1.73_m2} (ref >59)
GFR calc non Af Amer: 68 mL/min/{1.73_m2} (ref >59)
GLUCOSE: 85 mg/dL (ref 65–99)
Globulin, Total: 2.6 g/dL (ref 1.5–4.5)
POTASSIUM: 4.8 mmol/L (ref 3.5–5.2)
SODIUM: 140 mmol/L (ref 134–144)
Total Protein: 6.9 g/dL (ref 6.0–8.5)

## 2016-04-24 LAB — LIPID PANEL
Chol/HDL Ratio: 4.1 ratio units (ref 0.0–5.0)
Cholesterol, Total: 238 mg/dL — ABNORMAL HIGH (ref 100–199)
HDL: 58 mg/dL (ref >39)
LDL Calculated: 161 mg/dL — ABNORMAL HIGH (ref 0–99)
TRIGLYCERIDES: 94 mg/dL (ref 0–149)
VLDL Cholesterol Cal: 19 mg/dL (ref 5–40)

## 2016-04-24 LAB — HEPATITIS C ANTIBODY: Hep C Virus Ab: <0.1 s/co ratio (ref 0.0–0.9)

## 2016-04-24 MED ORDER — ATORVASTATIN CALCIUM 80 MG PO TABS: 80.0000 mg | ORAL_TABLET | Freq: Every day | ORAL | 3 refills | Status: DC

## 2016-04-24 NOTE — Addendum Note (Signed)
Addended by: Joni Reining C on: 04/24/2016 10:36 AM   Modules accepted: Orders

## 2016-05-23 ENCOUNTER — Encounter (HOSPITAL_COMMUNITY): Payer: Self-pay

## 2016-05-23 ENCOUNTER — Emergency Department (HOSPITAL_COMMUNITY)
Admission: EM | Admit: 2016-05-23 | Discharge: 2016-05-23 | Disposition: A | Discharge status: Home / Self Care | Payer: Medicare Other | Attending: Emergency Medicine | Admitting: Emergency Medicine

## 2016-05-23 DIAGNOSIS — I10 Essential (primary) hypertension: Secondary | ICD-10-CM | POA: Insufficient documentation

## 2016-05-23 DIAGNOSIS — R369 Urethral discharge, unspecified: Secondary | ICD-10-CM | POA: Insufficient documentation

## 2016-05-23 DIAGNOSIS — Z7982 Long term (current) use of aspirin: Secondary | ICD-10-CM | POA: Diagnosis not present

## 2016-05-23 DIAGNOSIS — Z202 Contact with and (suspected) exposure to infections with a predominantly sexual mode of transmission: Secondary | ICD-10-CM | POA: Insufficient documentation

## 2016-05-23 DIAGNOSIS — Z950 Presence of cardiac pacemaker: Secondary | ICD-10-CM | POA: Diagnosis not present

## 2016-05-23 DIAGNOSIS — Z79899 Other long term (current) drug therapy: Secondary | ICD-10-CM | POA: Diagnosis not present

## 2016-05-23 DIAGNOSIS — Z8673 Personal history of transient ischemic attack (TIA), and cerebral infarction without residual deficits: Secondary | ICD-10-CM | POA: Insufficient documentation

## 2016-05-23 DIAGNOSIS — F17210 Nicotine dependence, cigarettes, uncomplicated: Secondary | ICD-10-CM | POA: Insufficient documentation

## 2016-05-23 DIAGNOSIS — J449 Chronic obstructive pulmonary disease, unspecified: Secondary | ICD-10-CM | POA: Insufficient documentation

## 2016-05-23 DIAGNOSIS — N4889 Other specified disorders of penis: Secondary | ICD-10-CM | POA: Diagnosis present

## 2016-05-23 DIAGNOSIS — A599 Trichomoniasis, unspecified: Secondary | ICD-10-CM | POA: Diagnosis not present

## 2016-05-23 MED ORDER — METRONIDAZOLE 500 MG PO TABS
2000.0000 mg | ORAL_TABLET | Freq: Once | ORAL | Status: AC
  Administered 2016-05-23: 2000 mg via ORAL
  Filled 2016-05-23: qty 4

## 2016-05-23 NOTE — ED Triage Notes (Signed)
Pt reports he is here to be tested for STDs and reports his penis "hurts on the inside." Denies penile discharge or dysuria. He states his wife tested positive for trichomoniasis yesterday.

## 2016-05-23 NOTE — ED Provider Notes (Signed)
[facility] DEPT Provider Note    By signing my name below, I, Bea Graff, attest that this documentation has been prepared under the direction and in the presence of Alyse Low, [place]. Electronically Signed: Bea Graff, ED Scribe. 05/23/16. 11:42 AM.    History   Chief Complaint Chief Complaint  Patient presents with  . Exposure to STD   The history is provided by the patient and medical records. No language interpreter was used.    [name] is a 66 y.o. male with PMHx of CVA, HTN, COPD, HLD who presents to the Emergency Department needing treatment for trichomonas. He reports associated penile pain. He states his wife tested positive for this recently. He has not taken anything for pain relief. There are no modifying factors noted. He denies fever, chills, nausea, vomiting, penile discharge or dysuria.   Past Medical History:  Diagnosis Date  . CEREBROVASCULAR ACCIDENT 10/17/2008   Acute CVA of right thalamus 10/03/08 ECHO performed 2/2 CVA 10/09/08, EF 55-65%  Aspirin 325 mg daily and try to work on quitting smoking.   . Emphysema    per multiple CXRs  . Hypertension    goal < 140/90  . Sickle cell trait ([place])   . Snoring    Sleep study 09/02/10 was WNL  . Stroke [facility]) 10/03/08   Right thalamus  . Tuberculosis at age 60-3    Patient Active Problem List   Diagnosis Date Noted  . Abdominal aortic atherosclerosis ([place]) 04/21/2016  . Multiple lung nodules on CT 09/12/2015  . Urinary frequency 01/04/2015  . Seasonal allergies 06/15/2014  . Possible exposure to STD 06/15/2014  . Vision changes 05/18/2013  . Renal cyst, right 02/08/2013  . History of tuberculosis 01/19/2013  . Sinoatrial node dysfunction (HCC) 04/11/2012  . Atrial tachycardia ([place]) 04/11/2012  . Fractured tooth 03/17/2012  . Healthcare maintenance 11/26/2011  . Eustachian tube dysfunction 11/26/2011  . Pacemaker 11/25/2011  . COPD with asthma ([place])   . GERD (gastroesophageal reflux  disease) 02/04/2011  . Tobacco abuse 07/01/2010  . Back pain 06/24/2010  . Hyperlipidemia 09/30/2009  . MIGRAINE WITHOUT AURA 09/30/2009  . ERECTILE DYSFUNCTION, ORGANIC 01/16/2009  . Essential hypertension 11/22/2008  . CEREBROVASCULAR ACCIDENT 10/17/2008    Past Surgical History:  Procedure Laterality Date  . APPENDECTOMY  1977  . PACEMAKER INSERTION  07/24/08   MDT Adapta L implanted by Dr Blanch Media  . VASECTOMY  1990       Home Medications    Prior to Admission medications   Medication Sig Start Date End Date Taking? Authorizing Provider  albuterol (PROVENTIL HFA;VENTOLIN HFA) 108 (90 Base) MCG/ACT inhaler Inhale 2 puffs into the lungs every 6 (six) hours as needed for wheezing or shortness of breath. 01/14/16   [name], DO  amLODipine (NORVASC) 10 MG tablet Take 1 tablet (10 mg total) by mouth daily. 01/14/16   [name], DO  aspirin EC 81 MG tablet Take 1 tablet (81 mg total) by mouth daily. 09/12/15 09/11/16  Burgess Estelle, MD  atorvastatin (LIPITOR) 80 MG tablet Take 1 tablet (80 mg total) by mouth daily. 04/24/16 04/24/17  [name], DO  carvedilol (COREG) 3.125 MG tablet Take 1 tablet (3.125 mg total) by mouth 2 (two) times daily with a meal. 01/14/16   [name], DO  cetirizine (ZYRTEC) 10 MG tablet Take 1 tablet (10 mg total) by mouth daily as needed for allergies. 01/14/16   [name], DO  fluticasone (FLONASE) 50  MCG/ACT nasal spray Place 2 sprays into both nostrils daily. 01/14/16   [name], DO  hydrochlorothiazide (HYDRODIURIL) 25 MG tablet Take 1 tablet (25 mg total) by mouth daily. 01/14/16   [name], DO  nicotine (NICODERM CQ - DOSED IN MG/24 HOURS) 14 mg/24hr patch Place 1 patch (14 mg total) onto the skin daily. Patient not taking: Reported on 04/23/2016 09/26/15   Burgess Estelle, MD  Omega-3 Fatty Acids (FISH OIL PO) Take 1 capsule by mouth daily.    Historical Provider, MD    Family History Family History  Problem Relation Age of  Onset  . Cancer Sister   . Hypertension Mother   . Sickle cell anemia Son     Social History Social History  Substance Use Topics  . Smoking status: Current Every Day Smoker    Packs/day: 0.30    Years: 10.00    Types: Cigarettes  . Smokeless tobacco: Never Used     Comment: Cutting back. DOWN TO 4-5 CIAGARETTES  . Alcohol use No     Allergies   Ace inhibitors   Review of Systems Review of Systems  Genitourinary: Positive for penile pain. Negative for discharge and dysuria.  All other systems reviewed and are negative.    Physical Exam Updated Vital Signs BP (!) 127/94 (BP Location: Left Arm)   Pulse 80   Temp 98.2 F (36.8 C) (Oral)   Resp 18   SpO2 100%   Physical Exam  Constitutional: He is oriented to person, place, and time. He appears well-developed and well-nourished.  HENT:  Head: Normocephalic and atraumatic.  Neck: Normal range of motion.  Cardiovascular: Normal rate.   Pulmonary/Chest: Effort normal.  Genitourinary: Testes normal. Discharge found.  Genitourinary Comments: Clear discharge  Musculoskeletal: Normal range of motion.  Neurological: He is alert and oriented to person, place, and time.  Skin: Skin is warm and dry.  Psychiatric: He has a normal mood and affect. His behavior is normal.  Nursing note and vitals reviewed.    ED Treatments / Results  DIAGNOSTIC STUDIES: Oxygen Saturation is 100% on RA, normal by my interpretation.   COORDINATION OF CARE: 11:39 AM- Will check and treat for trichomonas. Will send cultures for GC/chlamydia. Offered testing for HIV and syphilis but pt declined. Pt verbalizes understanding and agrees to plan.  Medications - No data to display  Labs (all labs ordered are listed, but only abnormal results are displayed) Labs Reviewed  GC/CHLAMYDIA PROBE AMP ([place]) NOT AT [facility]    EKG  EKG Interpretation None       Radiology No results found.  Procedures Procedures (including critical  care time)  Medications Ordered in ED Medications - No data to display   Initial Impression / Assessment and Plan / ED Course  I have reviewed the triage vital signs and the nursing notes.  Pertinent labs & imaging results that were available during my care of the patient were reviewed by me and considered in my medical decision making (see chart for details).     Patient treated in the ED for trichomonas with Flagyl. Patient advised to inform and treat all sexual partners. Pt advised on safe sex practices and understands that they have GC/Chlamydia cultures pending and will result in 2-3 days. Pt encouraged to follow up at local health department for future STI checks. No concern for prostatitis or epididymitis. Discussed return precautions. Pt appears safe for discharge.    Final Clinical Impressions(s) / ED Diagnoses  Final diagnoses:  Exposure to STD  Trichomonas exposure    New Prescriptions Discharge Medication List as of 05/23/2016 11:48 AM    An After Visit Summary was printed and given to the patient.  I personally performed the services in this documentation, which was scribed in my presence.  The recorded information has been reviewed and considered.   Ronnald Collum.    Hollace Kinnier [place], PA-C 05/23/16 [place], MD 05/24/16 [phone]

## 2016-05-23 NOTE — ED Notes (Signed)
Declined W/C at D/C and was escorted to lobby by RN. 

## 2016-05-25 LAB — GC/CHLAMYDIA PROBE AMP (~~LOC~~) NOT AT ARMC
Chlamydia: NEGATIVE
Neisseria Gonorrhea: NEGATIVE

## 2016-10-01 ENCOUNTER — Ambulatory Visit: Payer: Medicare Other

## 2016-10-06 ENCOUNTER — Ambulatory Visit (INDEPENDENT_AMBULATORY_CARE_PROVIDER_SITE_OTHER): Payer: Medicare Other | Admitting: Internal Medicine

## 2016-10-06 ENCOUNTER — Other Ambulatory Visit (HOSPITAL_COMMUNITY)
Admission: RE | Admit: 2016-10-06 | Discharge: 2016-10-06 | Disposition: A | Discharge status: Home / Self Care | Payer: Medicare Other | Source: Ambulatory Visit | Attending: Student in an Organized Health Care Education/Training Program | Admitting: Student in an Organized Health Care Education/Training Program

## 2016-10-06 ENCOUNTER — Encounter: Payer: Self-pay | Admitting: Internal Medicine

## 2016-10-06 VITALS — BP 145/98 | HR 63 | Temp 98.0°F | Wt 167.1 lb

## 2016-10-06 DIAGNOSIS — R369 Urethral discharge, unspecified: Secondary | ICD-10-CM | POA: Insufficient documentation

## 2016-10-06 DIAGNOSIS — A5909 Other urogenital trichomoniasis: Secondary | ICD-10-CM | POA: Insufficient documentation

## 2016-10-06 DIAGNOSIS — I10 Essential (primary) hypertension: Secondary | ICD-10-CM | POA: Diagnosis not present

## 2016-10-06 MED ORDER — CARVEDILOL 3.125 MG PO TABS: 3.1250 mg | ORAL_TABLET | Freq: Two times a day (BID) | ORAL | 0 refills | Status: DC

## 2016-10-06 MED ORDER — HYDROCHLOROTHIAZIDE 25 MG PO TABS: 25.0000 mg | ORAL_TABLET | Freq: Every day | ORAL | 0 refills | Status: DC

## 2016-10-06 MED ORDER — AMLODIPINE BESYLATE 10 MG PO TABS: 10.0000 mg | ORAL_TABLET | Freq: Every day | ORAL | 0 refills | Status: DC

## 2016-10-06 NOTE — Patient Instructions (Signed)
Mr. Markoff,  It was a pleasure meeting you today. Please start taking her blood pressure medications daily. I will call you with the results of your tests to see if you need any medications.  Please follow up with her primary care provider in one month.

## 2016-10-06 NOTE — Progress Notes (Signed)
   CC: Penile discharge  HPI:  [name] is a 66 y.o. male with history noted below that presents to the acute care clinic for follow up on penile discharge.  Please see problem based charting for the status of patient's medical conditions.  Past Medical History:  Diagnosis Date  . CEREBROVASCULAR ACCIDENT 10/17/2008   Acute CVA of right thalamus 10/03/08 ECHO performed 2/2 CVA 10/09/08, EF 55-65%  Aspirin 325 mg daily and try to work on quitting smoking.   . Emphysema    per multiple CXRs  . Hypertension    goal < 140/90  . Sickle cell trait ([place])   . Snoring    Sleep study 09/02/10 was WNL  . Stroke [facility]) 10/03/08   Right thalamus  . Tuberculosis at age 65-3    Review of Systems:  Review of Systems  Constitutional: Negative for chills and fever.  Respiratory: Negative for shortness of breath.   Cardiovascular: Negative for chest pain.  Gastrointestinal: Negative for nausea and vomiting.  Genitourinary: Positive for dysuria. Negative for flank pain, frequency, hematuria and urgency.  Skin: Negative for itching and rash.     Physical Exam:  Vitals:   10/06/16 1448  BP: (!) 159/109  Pulse: 84  Temp: 98 F (36.7 C)  TempSrc: Oral  SpO2: 100%  Weight: 167 lb 1.6 oz (75.8 kg)   Physical Exam  Constitutional: He is well-developed, well-nourished, and in no distress.  Cardiovascular: Normal rate, regular rhythm and normal heart sounds.  Exam reveals no gallop and no friction rub.   No murmur heard. Pulmonary/Chest: Effort normal and breath sounds normal. No respiratory distress. He has no wheezes. He has no rales.  Genitourinary:  Genitourinary Comments: Scrotum and penis appeared normal without rash, erythema or discharge    Assessment & Plan:   See encounters tab for problem based medical decision making.   Patient discussed with Dr. Daryll Drown

## 2016-10-07 LAB — URINALYSIS, ROUTINE W REFLEX MICROSCOPIC
Bilirubin, UA: NEGATIVE
GLUCOSE, UA: NEGATIVE
KETONES UA: NEGATIVE
NITRITE UA: NEGATIVE
PROTEIN UA: NEGATIVE
RBC, UA: NEGATIVE
SPEC GRAV UA: 1.015 (ref 1.005–1.030)
Urobilinogen, Ur: 0.2 mg/dL (ref 0.2–1.0)
pH, UA: 5.5 (ref 5.0–7.5)

## 2016-10-07 LAB — MICROSCOPIC EXAMINATION: Casts: NONE SEEN /lpf

## 2016-10-08 DIAGNOSIS — R369 Urethral discharge, unspecified: Secondary | ICD-10-CM | POA: Insufficient documentation

## 2016-10-08 MED ORDER — SULFAMETHOXAZOLE-TRIMETHOPRIM 800-160 MG PO TABS: 1.0000 | ORAL_TABLET | Freq: Two times a day (BID) | ORAL | 0 refills | Status: DC

## 2016-10-08 NOTE — Assessment & Plan Note (Signed)
Assessment:  Essential hypertension Patient hasn't taken blood pressure medication in several weeks.  Today's blood pressure was 159/109.  Patient has previously been on amlodipine, carvedilol and HCTZ.  Will refill those today.  Recommended he follow up with his primary care provider in one month.  Plan -refill amlodipine, carvedilol, HCTZ - follow up with PCP in one month

## 2016-10-08 NOTE — Assessment & Plan Note (Signed)
  HPI: Patient states he noticed white discharge and burning at the penile head 2-3 days ago.  He states he wants a "shot" for possible STD.  We discussed that he needs to be tested for STD before treating.  He reports last time he had sexual intercourse was one month ago and with new partner.    Assessment:  Penile discharge Patient presents with 2-3 day history of penile discharge with burning.  Physical exam did not show discharge, erythema, rash to penis or scrotum.  Plan -GC/Chlamydia, trichomonas, candida, Gardnerella, trichomonas urine specimen -UA   Addendum UA with signs of infection, sent bactrim to pharmacy

## 2016-10-09 ENCOUNTER — Telehealth: Payer: Self-pay | Admitting: *Deleted

## 2016-10-09 ENCOUNTER — Other Ambulatory Visit: Payer: Self-pay | Admitting: Internal Medicine

## 2016-10-09 ENCOUNTER — Telehealth: Payer: Self-pay | Admitting: Internal Medicine

## 2016-10-09 LAB — URINE CYTOLOGY ANCILLARY ONLY
Chlamydia: NEGATIVE
NEISSERIA GONORRHEA: NEGATIVE
TRICH (WINDOWPATH): POSITIVE — AB

## 2016-10-09 MED ORDER — METRONIDAZOLE 500 MG PO TABS: 2000.0000 mg | ORAL_TABLET | Freq: Once | ORAL | 0 refills | Status: AC

## 2016-10-09 NOTE — Progress Notes (Addendum)
Internal Medicine Clinic Attending  Case discussed with Dr. Heber Hornsby at the time of the visit.  We reviewed the resident's history and exam and pertinent patient test results.  I agree with the assessment, diagnosis, and plan of care documented in the resident's note.  Urine also + for trichomonas.  Patient should be treated with Flagyl 2gm once and he should inform sexual partners.

## 2016-10-09 NOTE — Telephone Encounter (Signed)
Called patient & informed of MD note, informed of prescribed abx, instructions & pharmacy where it was sent.

## 2016-10-09 NOTE — Telephone Encounter (Signed)
Discussed results with [name] this morning.  I informed him he had trichomonas and UTI and sent two antibiotics to the pharmacy.  I also advised he tell his sexual partners to be treated for trichomonas.

## 2016-10-12 LAB — URINE CYTOLOGY ANCILLARY ONLY
Bacterial vaginitis: NEGATIVE
Candida vaginitis: NEGATIVE

## 2016-11-26 ENCOUNTER — Ambulatory Visit (INDEPENDENT_AMBULATORY_CARE_PROVIDER_SITE_OTHER): Payer: Medicare Other | Admitting: Internal Medicine

## 2016-11-26 ENCOUNTER — Encounter: Payer: Self-pay | Admitting: Internal Medicine

## 2016-11-26 VITALS — BP 153/109 | HR 80 | Temp 98.3°F | Ht 73.0 in | Wt 164.1 lb

## 2016-11-26 DIAGNOSIS — I10 Essential (primary) hypertension: Secondary | ICD-10-CM | POA: Diagnosis not present

## 2016-11-26 DIAGNOSIS — F17210 Nicotine dependence, cigarettes, uncomplicated: Secondary | ICD-10-CM

## 2016-11-26 DIAGNOSIS — Z23 Encounter for immunization: Secondary | ICD-10-CM | POA: Diagnosis not present

## 2016-11-26 DIAGNOSIS — I495 Sick sinus syndrome: Secondary | ICD-10-CM

## 2016-11-26 DIAGNOSIS — Z79899 Other long term (current) drug therapy: Secondary | ICD-10-CM | POA: Diagnosis not present

## 2016-11-26 DIAGNOSIS — I700 Atherosclerosis of aorta: Secondary | ICD-10-CM

## 2016-11-26 DIAGNOSIS — J4489 Other specified chronic obstructive pulmonary disease: Secondary | ICD-10-CM

## 2016-11-26 DIAGNOSIS — J449 Chronic obstructive pulmonary disease, unspecified: Secondary | ICD-10-CM

## 2016-11-26 DIAGNOSIS — H539 Unspecified visual disturbance: Secondary | ICD-10-CM

## 2016-11-26 DIAGNOSIS — H538 Other visual disturbances: Secondary | ICD-10-CM

## 2016-11-26 DIAGNOSIS — Z125 Encounter for screening for malignant neoplasm of prostate: Secondary | ICD-10-CM

## 2016-11-26 DIAGNOSIS — Z0000 Encounter for general adult medical examination without abnormal findings: Secondary | ICD-10-CM

## 2016-11-26 DIAGNOSIS — R918 Other nonspecific abnormal finding of lung field: Secondary | ICD-10-CM | POA: Diagnosis not present

## 2016-11-26 DIAGNOSIS — Z720 Tobacco use: Secondary | ICD-10-CM

## 2016-11-26 MED ORDER — HYDROCHLOROTHIAZIDE 25 MG PO TABS: 25.0000 mg | ORAL_TABLET | Freq: Every day | ORAL | 3 refills | Status: DC

## 2016-11-26 MED ORDER — AMLODIPINE BESYLATE 10 MG PO TABS: 10.0000 mg | ORAL_TABLET | Freq: Every day | ORAL | 3 refills | Status: DC

## 2016-11-26 MED ORDER — FEXOFENADINE HCL 180 MG PO TABS: 180.0000 mg | ORAL_TABLET | Freq: Every day | ORAL | Status: DC

## 2016-11-26 MED ORDER — CARVEDILOL 3.125 MG PO TABS: 3.1250 mg | ORAL_TABLET | Freq: Two times a day (BID) | ORAL | 3 refills | Status: DC

## 2016-11-26 MED ORDER — ATORVASTATIN CALCIUM 80 MG PO TABS: 80.0000 mg | ORAL_TABLET | Freq: Every day | ORAL | 3 refills | Status: DC

## 2016-11-26 MED ORDER — ALBUTEROL SULFATE HFA 108 (90 BASE) MCG/ACT IN AERS: 2.0000 | INHALATION_SPRAY | Freq: Four times a day (QID) | RESPIRATORY_TRACT | 5 refills | Status: DC | PRN

## 2016-11-26 NOTE — Patient Instructions (Addendum)
Annual Wellness Visit   Medicare Covered Preventative Screenings and Services  Services & Screenings Men and Women Who How Often Need? Date of Last Service Action  Abdominal Aortic Aneurysm Adults with AAA risk factors Once x  ordered  Alcohol Misuse and Counseling All Adults Screening once a year if no alcohol misuse. Counseling up to 4 face to face sessions.     Bone Density Measurement  Adults at risk for osteoporosis Once every 2 yrs     Lipid Panel Z13.6 All adults without CV disease Once every 5 yrs  Done this year   Colorectal Cancer   Stool sample or  Colonoscopy All adults 60 and older   Once every year  Every 10 years     Depression All Adults Once a year  Today   Diabetes Screening Blood glucose, post glucose load, or GTT Z13.1  All adults at risk  Pre-diabetics  Once per year  Twice per year     Diabetes  Self-Management Training All adults Diabetics 10 hrs first year; 2 hours subsequent years. Requires Copay     Glaucoma  Diabetics  Family history of glaucoma  African Americans 12 yrs +  Hispanic Americans 54 yrs + Annually - requires coppay x  referral  Hepatitis C Z72.89 or F19.20  High Risk for HCV  Born between 1945 and 1965  Annually  Once  04/23/16   HIV Z11.4 All adults based on risk  Annually btw ages 75 & 56 regardless of risk  Annually > 65 yrs if at increased risk     Lung Cancer Screening Asymptomatic adults aged 16-77 with 30 pack yr history and current smoker OR quit within the last 15 yrs Annually Must have counseling and shared decision making documentation before first screen x  LDCT  Medical Nutrition Therapy Adults with   Diabetes  Renal disease  Kidney transplant within past 3 yrs 3 hours first year; 2 hours subsequent years     Obesity and Counseling All adults Screening once a year Counseling if BMI 30 or higher  Today   Tobacco Use Counseling Adults who use tobacco  Up to 8 visits in one year x  discussed  Vaccines  Z23  Hepatitis B  Influenza   Pneumonia  Adults   Once  Once every flu season  Two different vaccines separated by one year     Next Annual Wellness Visit People with Medicare Every year x      Services & Screenings Women Who How Often Need  Date of Last Service Action  Mammogram  Z12.31 Women over 21 One baseline ages 27-39. Annually ager 40 yrs+     Pap tests All women Annually if high risk. Every 2 yrs for normal risk women     Screening for cervical cancer with   Pap (Z01.419 nl or Z01.411abnl) &  HPV Z11.51 Women aged 32 to 65 Once every 5 yrs     Screening pelvic and breast exams All women Annually if high risk. Every 2 yrs for normal risk women     Sexually Transmitted Diseases  Chlamydia  Gonorrhea  Syphilis All at risk adults Annually for non pregnant females at increased risk         [place] Men Who How Ofter Need  Date of Last Service Action  Prostate Cancer - DRE & PSA Men over 50 Annually.  DRE might require a copay. x  ordered  Sexually Transmitted Diseases  Syphilis All at risk  adults Annually for men at increased risk         Things That May Be Affecting Your Health:  Alcohol  Hearing loss  Pain    Depression  Home Safety  Sexual Health   Diabetes  Lack of physical activity  Stress   Difficulty with daily activities  Loneliness  Tiredness   Drug use  Medicines  Tobacco use   Falls  Motor Vehicle Safety  Weight  x Food choices  Oral Health  Other    YOUR PERSONALIZED HEALTH PLAN : 1. Schedule your next subsequent Medicare Wellness visit in one year 2. Attend all of your regular appointments to address your medical issues 3. Complete the preventative screenings and services

## 2016-11-26 NOTE — Progress Notes (Signed)
Subjective:   [name] is a 66 y.o. male who presents for a Medicare Annual Wellness Visit.  The following items have been reviewed and updated today in the appropriate area in the EMR.   Health Risk Assessment  Height, weight, BMI, and BP Visual acuity if needed Depression screen Fall risk / safety level Advance directive discussion Medical and family history were reviewed and updated Updating list of other providers & suppliers Medication reconciliation, including over the counter medicines Cognitive screen Written screening schedule Risk Factor list Personalized health advice, risky behaviors, and treatment advice       Objective:    Vitals: BP (!) 153/109 (BP Location: Right Arm, Patient Position: Sitting, Cuff Size: Normal)   Pulse 80   Temp 98.3 F (36.8 C) (Oral)   Ht 6\' 1"  (1.854 m)   Wt 164 lb 1.6 oz (74.4 kg)   SpO2 100% Comment: RA  BMI 21.65 kg/m   Exam: QQI:[id number] clouding of lens Heart: RRR Lungs: CTAB no wheezing  Activities of Daily Living In your present state of health, do you have any difficulty performing the following activities: 11/26/2016 10/06/2016  Hearing? N N  Vision? Y N  Difficulty concentrating or making decisions? N N  Walking or climbing stairs? N N  Dressing or bathing? N N  Doing errands, shopping? N N  Some recent data might be hidden    Goals Goals    . Blood Pressure < 140/90       Fall Risk Fall Risk  11/26/2016 10/06/2016 04/23/2016 09/26/2015 09/12/2015  Falls in the past year? No No No No No  Risk for fall due to : - - - - -    Depression Screen PHQ 2/9 Scores 11/26/2016 10/06/2016 04/23/2016 09/26/2015  PHQ - 2 Score 0 0 0 0     Cognitive Testing I assessed the patient for cognitive issues and the patient did not have issues with his  cognition.    Assessment and Plan:    During the course of the visit the patient was educated and counseled about appropriate screening and preventive services as  documented in the assessment and plan.  The printed AVS was given to the patient and included an updated screening schedule, a list of risk factors, and personalized health advice.    Essential hypertension HPI: Paitent here for AWV however BP is 153/109.  He reports currently his [employer] building is being renovated and his medications are in storage.  He does not think they will be good after as they have been in a "POD" in the heat.  A: Essential HTN not at goal  P: Restart previous antihypertensives, provided new Rx -Amlodipine 10mg  daily -HCTZ 25mg  daily Carvedilol 3.125 mg BID  Tobacco abuse Discussed importance of cessation.  Went over verious medications and NRT,. He is currently smoking 1/2 ppd. Would like to try to quit on his own. Spent a dedicated 4 minutes to smoking cessation discussion.  Multiple lung nodules on CT He never got the CT scan completed. However in discussing preventative care, he would like to pursue a low dose CT of his chest.  Vision changes Reports blurry vision worsen in right eye compared to left.  Having trouble reading road signs at night. On exam it does appear he has cataracts. But he would also be at risk for glaucoma. I have placed a referral for opthalmology evaluation.  [name] is a 66 y.o. Current smoker (1/2 ppd for 54 years) with  approximately a 30+ pack year history (used to smoke 3/4-1ppd). Therefore, [name] does meet the USPTF recommendations for lung cancer screening with low dose CT in persons age 26 - 46 yrs with a 30 pack year history who are currently smoking or have quit in the past 15 yrs. We have discussed the risks and benefits of screening, including but not limited to the following. Lung cancer screening might detect about one half of lung cancer at an early, curative stage. For the average screened person, the risk of dying from lung cancer will decrease from 1.7% to 1.4% with screening. About 1 in 4 screened  patients would have a false positive result leading to more radiation exposure, cost, anxiety, and / or invasive procedures. 95% of false positives will not lead to a diagnosis of cancer. After discussion, [name] has decided to pursue lung cancer screening. Low dose chest CT ordered.  [name] asked me about prostate cancer screening. He has not had prostate cancer screening before. Risk factors : he does not know have a first degree relative has been dx with prostate cancer before the age of 58 yrs or died of prostate cancer before he age of 53 yrs; he is African Optometrist. We discussed that the USPTF discourages prostate cancer screening but that other medical groups contiune to recommend screening. Our discussion included the following :   In an unscreened population, about 5 out of every 1,000 men will die from prostate cancer after 10 years.   At best, PSA screening may help 1 man in 1,000 avoid death from prostate cancer after at least 10 years.  100 - 120 of every 1,000 men screened receive a false positive test. A positive test causes worry and anxiety and usually leads to a biopsy.   One-third of men who have a biopsy have side effects like pain, fever, infection, bleeding, and urinary problems. 1% will need to be hospitalized.  Not all prostate cancer cause an elevated PSA and not all elevated PSA's are from cancer.  In most cases, prostate cancer grows slowly and men die with rather than from prostate cancer. We have no way to distinguish benign prostate cancers from aggressive prostate cancers.   90% of men found to have prostate cancer elect to have treatment.   For every 1,000 men who are screened with the PSA test:  30 to 60 men will develop erectile dysfunction or urinary incontinence due to treatment   2 men will experience a serious cardiovascular event, such as a heart attack, due to treatment   1 man will develop a serious blood clot in his leg or  lungs due to treatment   For every 3,000 men who are screened with the PSA test:   1 man will die due to complications from surgical treatment    [name] decision on prostate cancer screening is : ordered  Discussed AAA screening, he would like to proceed, ordered.   [name], DO  11/27/2016

## 2016-11-27 LAB — PSA: PROSTATE SPECIFIC AG, SERUM: 0.8 ng/mL (ref 0.0–4.0)

## 2016-11-27 NOTE — Assessment & Plan Note (Signed)
Discussed importance of cessation.  Went over verious medications and NRT,. He is currently smoking 1/2 ppd. Would like to try to quit on his own. Spent a dedicated 4 minutes to smoking cessation discussion.

## 2016-11-27 NOTE — Assessment & Plan Note (Signed)
He never got the CT scan completed. However in discussing preventative care, he would like to pursue a low dose CT of his chest.

## 2016-11-27 NOTE — Assessment & Plan Note (Signed)
HPI: Paitent here for AWV however BP is 153/109.  He reports currently his [employer] building is being renovated and his medications are in storage.  He does not think they will be good after as they have been in a "POD" in the heat.  A: Essential HTN not at goal  P: Restart previous antihypertensives, provided new Rx -Amlodipine 10mg  daily -HCTZ 25mg  daily Carvedilol 3.125 mg BID

## 2016-11-27 NOTE — Assessment & Plan Note (Signed)
Reports blurry vision worsen in right eye compared to left.  Having trouble reading road signs at night. On exam it does appear he has cataracts. But he would also be at risk for glaucoma. I have placed a referral for opthalmology evaluation.

## 2016-12-09 DIAGNOSIS — H2513 Age-related nuclear cataract, bilateral: Secondary | ICD-10-CM | POA: Diagnosis not present

## 2017-01-05 ENCOUNTER — Ambulatory Visit (HOSPITAL_COMMUNITY)
Admission: RE | Admit: 2017-01-05 | Discharge: 2017-01-05 | Disposition: A | Discharge status: Home / Self Care | Payer: Medicare Other | Source: Ambulatory Visit | Attending: Internal Medicine | Admitting: Internal Medicine

## 2017-01-05 DIAGNOSIS — Z720 Tobacco use: Secondary | ICD-10-CM | POA: Diagnosis not present

## 2017-01-05 DIAGNOSIS — I700 Atherosclerosis of aorta: Secondary | ICD-10-CM | POA: Diagnosis not present

## 2017-01-05 DIAGNOSIS — J439 Emphysema, unspecified: Secondary | ICD-10-CM | POA: Insufficient documentation

## 2017-03-03 DIAGNOSIS — R0683 Snoring: Secondary | ICD-10-CM | POA: Insufficient documentation

## 2017-03-03 DIAGNOSIS — A159 Respiratory tuberculosis unspecified: Secondary | ICD-10-CM | POA: Insufficient documentation

## 2017-03-03 DIAGNOSIS — D573 Sickle-cell trait: Secondary | ICD-10-CM | POA: Insufficient documentation

## 2017-03-03 DIAGNOSIS — I10 Essential (primary) hypertension: Secondary | ICD-10-CM | POA: Insufficient documentation

## 2017-03-24 ENCOUNTER — Ambulatory Visit (INDEPENDENT_AMBULATORY_CARE_PROVIDER_SITE_OTHER): Payer: Medicare Other | Admitting: Internal Medicine

## 2017-03-24 ENCOUNTER — Encounter: Payer: Self-pay | Admitting: Internal Medicine

## 2017-03-24 VITALS — BP 144/82 | HR 67 | Ht 73.0 in | Wt 169.0 lb

## 2017-03-24 DIAGNOSIS — I495 Sick sinus syndrome: Secondary | ICD-10-CM | POA: Diagnosis not present

## 2017-03-24 DIAGNOSIS — Z720 Tobacco use: Secondary | ICD-10-CM

## 2017-03-24 DIAGNOSIS — I10 Essential (primary) hypertension: Secondary | ICD-10-CM | POA: Diagnosis not present

## 2017-03-24 LAB — CUP PACEART INCLINIC DEVICE CHECK
Battery Impedance: 701 Ohm
Brady Statistic AP VP Percent: 0 %
Date Time Interrogation Session: 20190116121049
Implantable Lead Implant Date: 20101105
Implantable Lead Location: 753860
Implantable Lead Model: 5076
Implantable Pulse Generator Implant Date: 20101105
Lead Channel Impedance Value: 459 Ohm
Lead Channel Pacing Threshold Amplitude: 0.625 V
Lead Channel Pacing Threshold Pulse Width: 0.4 ms
Lead Channel Sensing Intrinsic Amplitude: 8 mV
Lead Channel Setting Pacing Amplitude: 2 V
Lead Channel Setting Pacing Amplitude: 2.5 V
MDC IDC LEAD IMPLANT DT: 20101105
MDC IDC LEAD LOCATION: 753859
MDC IDC MSMT BATTERY REMAINING LONGEVITY: 79 mo
MDC IDC MSMT BATTERY VOLTAGE: 2.79 V
MDC IDC MSMT LEADCHNL RA PACING THRESHOLD AMPLITUDE: 0.5 V
MDC IDC MSMT LEADCHNL RA PACING THRESHOLD AMPLITUDE: 0.625 V
MDC IDC MSMT LEADCHNL RA PACING THRESHOLD PULSEWIDTH: 0.4 ms
MDC IDC MSMT LEADCHNL RA PACING THRESHOLD PULSEWIDTH: 0.4 ms
MDC IDC MSMT LEADCHNL RA SENSING INTR AMPL: 2.8 mV
MDC IDC MSMT LEADCHNL RV IMPEDANCE VALUE: 450 Ohm
MDC IDC MSMT LEADCHNL RV PACING THRESHOLD AMPLITUDE: 0.75 V
MDC IDC MSMT LEADCHNL RV PACING THRESHOLD PULSEWIDTH: 0.4 ms
MDC IDC SET LEADCHNL RV PACING PULSEWIDTH: 0.4 ms
MDC IDC SET LEADCHNL RV SENSING SENSITIVITY: 4 mV
MDC IDC STAT BRADY AP VS PERCENT: 45 %
MDC IDC STAT BRADY AS VP PERCENT: 0 %
MDC IDC STAT BRADY AS VS PERCENT: 55 %

## 2017-03-24 NOTE — Patient Instructions (Signed)
Medication Instructions:  Your physician recommends that you continue on your current medications as directed. Please refer to the Current Medication list given to you today.   Labwork: None ordered   Testing/Procedures: None ordered   Follow-Up: Remote monitoring is used to monitor your Pacemaker  from home. This monitoring reduces the number of office visits required to check your device to one time per year. It allows [place] to keep an eye on the functioning of your device to ensure it is working properly. You are scheduled for a device check from home on 06/23/17. You may send your transmission at any time that day. If you have a wireless device, the transmission will be sent automatically. After your physician reviews your transmission, you will receive a postcard with your next transmission date.  Your physician wants you to follow-up in: 12 months with [place] will receive a reminder letter in the mail two months in advance. If you don't receive a letter, please call our office to schedule the follow-up appointment.     Any Other Special Instructions Will Be Listed Below (If Applicable).     If you need a refill on your cardiac medications before your next appointment, please call your pharmacy.

## 2017-03-24 NOTE — Progress Notes (Signed)
    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  Since last being seen in our clinic, the patient reports doing very well.  Today, he denies symptoms of palpitations, chest pain, shortness of breath,  lower extremity edema, dizziness, presyncope, or syncope.  The patient is otherwise without complaint today.   Past Medical History:  Diagnosis Date  . CEREBROVASCULAR ACCIDENT 10/17/2008   Acute CVA of right thalamus 10/03/08 ECHO performed 2/2 CVA 10/09/08, EF 55-65%  Aspirin 325 mg daily and try to work on quitting smoking.   . Emphysema    per multiple CXRs  . Hypertension    goal < 140/90  . Sickle cell trait ([place])   . Snoring    Sleep study 09/02/10 was WNL  . Stroke [facility]) 10/03/08   Right thalamus  . Tuberculosis at age 26-3   Past Surgical History:  Procedure Laterality Date  . APPENDECTOMY  1977  . PACEMAKER INSERTION  07/24/08   MDT Adapta L implanted by Dr Blanch Media  . VASECTOMY  1990    ROS- all systems are reviewed and negative except as per HPI above  Current Outpatient Medications  Medication Sig Dispense Refill  . albuterol (PROVENTIL HFA;VENTOLIN HFA) 108 (90 Base) MCG/ACT inhaler Inhale 2 puffs into the lungs every 6 (six) hours as needed for wheezing or shortness of breath. 1 Inhaler 5  . amLODipine (NORVASC) 10 MG tablet Take 1 tablet (10 mg total) by mouth daily. 90 tablet 3  . atorvastatin (LIPITOR) 80 MG tablet Take 1 tablet (80 mg total) by mouth daily. 90 tablet 3  . carvedilol (COREG) 3.125 MG tablet Take 1 tablet (3.125 mg total) by mouth 2 (two) times daily with a meal. 180 tablet 3  . fexofenadine (ALLEGRA ALLERGY) 180 MG tablet Take 1 tablet (180 mg total) by mouth daily.    . hydrochlorothiazide (HYDRODIURIL) 25 MG tablet Take 1 tablet (25 mg total) by mouth daily. 90 tablet 3   No current facility-administered medications for this visit.     Physical  Exam: Vitals:   03/24/17 0917  BP: (!) 144/82  Pulse: 67  Weight: 169 lb (76.7 kg)  Height: 6\' 1"  (1.854 m)    GEN- The patient is well appearing, alert and oriented x 3 today.   Head- normocephalic, atraumatic Eyes-  Sclera clear, conjunctiva pink Ears- hearing intact Oropharynx- clear Lungs- Clear to ausculation bilaterally, normal work of breathing Chest- pacemaker pocket is well healed Heart- Regular rate and rhythm, no murmurs, rubs or gallops, PMI not laterally displaced GI- soft, NT, ND, + BS Extremities- no clubbing, cyanosis, or edema  Pacemaker interrogation- reviewed in detail today,  See PACEART report  ekg tracing ordered today is personally reviewed and shows atrial paced rhythm 67 bpm,   Assessment and Plan:  1. Symptomatic sinus bradycardia  Normal pacemaker function See Pace Art report No changes today  2. HTN Stable No change required today  3. Tobacco Cessation advised  4. atach Very short episodes Asymptomatic Will follow conservatively  [facility] Return to see EP NP every year  Thompson Grayer MD, [facility] 03/24/2017 9:50 AM

## 2017-06-02 ENCOUNTER — Telehealth: Payer: Self-pay | Admitting: *Deleted

## 2017-06-02 NOTE — Telephone Encounter (Signed)
Ok great thank you.

## 2017-06-02 NOTE — Telephone Encounter (Signed)
Pt calls and c/o "a little chest pain" started this am, denies short of breath, N&V, h/a, weakness. He states "might be something I ate". He is again encouraged to go to ED but refuses. appt [facility] Friday 3/29. He is again encouraged to go to urg care or ED.

## 2017-06-04 ENCOUNTER — Ambulatory Visit (HOSPITAL_COMMUNITY)
Admission: RE | Admit: 2017-06-04 | Discharge: 2017-06-04 | Disposition: A | Discharge status: Home / Self Care | Payer: Medicare Other | Source: Ambulatory Visit | Attending: Oncology | Admitting: Oncology

## 2017-06-04 ENCOUNTER — Ambulatory Visit (INDEPENDENT_AMBULATORY_CARE_PROVIDER_SITE_OTHER): Payer: Medicare Other | Admitting: Internal Medicine

## 2017-06-04 ENCOUNTER — Encounter: Payer: Self-pay | Admitting: Internal Medicine

## 2017-06-04 ENCOUNTER — Other Ambulatory Visit: Payer: Self-pay

## 2017-06-04 VITALS — BP 163/113 | HR 84 | Temp 98.2°F | Ht 73.25 in | Wt 165.2 lb

## 2017-06-04 DIAGNOSIS — M7989 Other specified soft tissue disorders: Secondary | ICD-10-CM | POA: Diagnosis not present

## 2017-06-04 DIAGNOSIS — Z87828 Personal history of other (healed) physical injury and trauma: Secondary | ICD-10-CM

## 2017-06-04 DIAGNOSIS — I10 Essential (primary) hypertension: Secondary | ICD-10-CM | POA: Diagnosis not present

## 2017-06-04 DIAGNOSIS — M7122 Synovial cyst of popliteal space [Baker], left knee: Secondary | ICD-10-CM

## 2017-06-04 DIAGNOSIS — Z8719 Personal history of other diseases of the digestive system: Secondary | ICD-10-CM

## 2017-06-04 DIAGNOSIS — Z79899 Other long term (current) drug therapy: Secondary | ICD-10-CM

## 2017-06-04 LAB — D-DIMER, QUANTITATIVE: D-Dimer, Quant: 2 ug/mL-FEU — ABNORMAL HIGH (ref 0.00–0.50)

## 2017-06-04 MED ORDER — HYDROCHLOROTHIAZIDE 25 MG PO TABS: 25.0000 mg | ORAL_TABLET | Freq: Every day | ORAL | 3 refills | Status: DC

## 2017-06-04 MED ORDER — CARVEDILOL 3.125 MG PO TABS: 3.1250 mg | ORAL_TABLET | Freq: Two times a day (BID) | ORAL | 3 refills | Status: DC

## 2017-06-04 MED ORDER — AMLODIPINE BESYLATE 10 MG PO TABS: 10.0000 mg | ORAL_TABLET | Freq: Every day | ORAL | 3 refills | Status: DC

## 2017-06-04 NOTE — Assessment & Plan Note (Addendum)
He presents with 10 days of left leg swelling and calf tenderness.  His Wells score for DVT was moderate risk.  Unfortunately, his D-dimer was elevated at 2 so we were unable to rule out DVT.  He will be going to the ultrasound lab directly from clinic today for lower extremity dopplers to assess for DVT.  Plan: - If dopplers are positive for DVT, plan will be to start DOAC and follow up early in clinic next week - If dopplers are negative, we will plan to provide supportive care for his lower extremity edema and plan follow up as needed in the next couple weeks.  - His cell phone number is [phone]  Addendum 4:35 PM 06/04/17: - Spoke with vascular lab and patient dopplers are negative for DVT.  There was a large popliteal cyst seen - Called patient and discussed results and asked him to RTC early next week for follow up where they can attempt arthrocentesis and steroid injection.  He expressed understadning.
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
## 2017-06-04 NOTE — Progress Notes (Signed)
Internal Medicine Clinic Attending  Case discussed with Dr. Wallace at the time of the visit.  We reviewed the resident's history and exam and pertinent patient test results.  I agree with the assessment, diagnosis, and plan of care documented in the resident's note.  

## 2017-06-04 NOTE — Progress Notes (Signed)
Preliminary notes by tech--Left lower extremity venous duplex study completed. Negative for deep and superficial veins thrombosis. A 2.8X1.3X4.2CM on left popliteal fossa complex fluid collection noted medially. Result discussed with Dr. Juleen China by phone.   Hongying Andrell Bergeson(RDMS RVT) 06/04/17 4:30 PM

## 2017-06-04 NOTE — Assessment & Plan Note (Signed)
BP: (!) 163/113    Patient was seen by PCP in Sept 2018 and had all his BP meds refilled at that time.  He reports today that he has not been taking any of these medications and they have been sitting in storage for the past 6 months.  He is asking for a refill of all his BP meds today as he is motivated to start taking them.  Plan: - Provided new prescriptions for all his BP meds - Amlodipine 10mg  daily - HCTZ 25mg  daily - Carvedilol 3.125 mg BID

## 2017-06-07 ENCOUNTER — Encounter (HOSPITAL_COMMUNITY): Payer: Medicare Other

## 2017-06-17 ENCOUNTER — Other Ambulatory Visit: Payer: Self-pay

## 2017-06-17 ENCOUNTER — Ambulatory Visit (INDEPENDENT_AMBULATORY_CARE_PROVIDER_SITE_OTHER): Payer: Medicare Other | Admitting: Internal Medicine

## 2017-06-17 ENCOUNTER — Encounter: Payer: Self-pay | Admitting: Internal Medicine

## 2017-06-17 VITALS — BP 156/109 | HR 74 | Temp 97.9°F | Ht 73.25 in | Wt 163.3 lb

## 2017-06-17 DIAGNOSIS — M7122 Synovial cyst of popliteal space [Baker], left knee: Secondary | ICD-10-CM | POA: Diagnosis not present

## 2017-06-17 DIAGNOSIS — J449 Chronic obstructive pulmonary disease, unspecified: Secondary | ICD-10-CM | POA: Diagnosis not present

## 2017-06-17 DIAGNOSIS — Z79899 Other long term (current) drug therapy: Secondary | ICD-10-CM

## 2017-06-17 DIAGNOSIS — I10 Essential (primary) hypertension: Secondary | ICD-10-CM | POA: Diagnosis not present

## 2017-06-17 DIAGNOSIS — M25562 Pain in left knee: Secondary | ICD-10-CM | POA: Insufficient documentation

## 2017-06-17 LAB — SYNOVIAL CELL COUNT + DIFF, W/ CRYSTALS
CRYSTALS FLUID: NONE SEEN
EOSINOPHILS-SYNOVIAL: 0 % (ref 0–1)
LYMPHOCYTES-SYNOVIAL FLD: 68 % — AB (ref 0–20)
MONOCYTE-MACROPHAGE-SYNOVIAL FLUID: 0 % — AB (ref 50–90)
NEUTROPHIL, SYNOVIAL: 32 % — AB (ref 0–25)
WBC, Synovial: 747 /mm3 — ABNORMAL HIGH (ref 0–200)

## 2017-06-17 MED ORDER — ALBUTEROL SULFATE HFA 108 (90 BASE) MCG/ACT IN AERS: 2.0000 | INHALATION_SPRAY | Freq: Four times a day (QID) | RESPIRATORY_TRACT | 5 refills | Status: DC | PRN

## 2017-06-17 NOTE — Assessment & Plan Note (Signed)
BP above goal.  Discussed need to take all medications, and follow up in 2 weeks.

## 2017-06-17 NOTE — Progress Notes (Signed)
  Subjective:  HPI: [name] is a 67 y.o. male who presents for left knee pain  He was seen in our clinic last week with complaints of left knee swelling followed by left leg swelling, a ddimer was checked and was elevated, vascular U/s ruled out DVT however found large popliteal cyst.  He returns today for follow up of this. He cannot remember an inciting injury or event.  Knee is mildly painful but   Review of Systems: Review of Systems  Constitutional: Negative for chills and fever.  Respiratory: Negative for shortness of breath and wheezing.   Musculoskeletal: Positive for joint pain. Negative for falls.    Objective:  Physical Exam: Vitals:   06/17/17 1425  BP: (!) 156/109  Pulse: 74  Temp: 97.9 F (36.6 C)  TempSrc: Oral  SpO2: 100%  Weight: 163 lb 4.8 oz (74.1 kg)  Height: 6' 1.25" (1.861 m)   Physical Exam  Constitutional: He appears well-developed and well-nourished.  Musculoskeletal:       Left knee: He exhibits swelling and effusion. He exhibits normal alignment, no LCL laxity, normal meniscus and no MCL laxity. No tenderness found.  Nursing note and vitals reviewed.    Large baker's cyst left popliteal fossa  Assessment & Plan:  Baker's cyst of knee, left Discussed with him the nature of baker's cyst.  Given he is symptomatic we discussed arthrocentesis and steroid injection, he was agreeable. 20cc of synovial fluid was aspirated and sent for cell count and crystal exam, fluid was initally clear yellow, however patient moved during tap and fluid became slightly blood tinged, 40mg  of kenalog was then injected with 2cc of 1% lidocaine.  Will plan to reevalaute in about 2 weeks.  COPD with asthma ([place]) Requested Albuterol refill, which was sent to pharmacy.  Essential hypertension BP above goal.  Discussed need to take all medications, and follow up in 2 weeks.   After consent was obtained, using sterile technique the left knee was prepped and 3  ml's of 1% plain Lidocaine used to anesthetize the needle tract into the joint from the lateral suprapatellar approach with ultrasound guidance. The knee joint was entered with an 18 gauge 1.5in needle and 20 ml's of initially clear yellow however after patient movement red tinged  fluid was withdrawn and sent for cell count and crystal exam.  1cc of Kenelog 40 mg and 2 ml 1% Lidocaine without was then injected and the needle withdrawn.  The procedure was well tolerated.  The patient is asked to continue to rest the knee for a few more days before resuming regular activities.  It may be more painful for the first 1-2 days.  Watch for fever, or increased swelling or persistent pain in knee. Call or return to clinic prn if such symptoms occur or the knee fails to improve as anticipated.  Left suprapatellar effusion.   Medications Ordered Meds ordered this encounter  Medications  . albuterol (PROVENTIL HFA;VENTOLIN HFA) 108 (90 Base) MCG/ACT inhaler    Sig: Inhale 2 puffs into the lungs every 6 (six) hours as needed for wheezing or shortness of breath.    Dispense:  1 Inhaler    Refill:  5   Other Orders Orders Placed This Encounter  Procedures  . Cell count + diff,  w/ cryst-synvl fld   Follow Up: Return in about 2 weeks (around 07/01/2017).

## 2017-06-17 NOTE — Assessment & Plan Note (Signed)
Requested Albuterol refill, which was sent to pharmacy.

## 2017-06-17 NOTE — Assessment & Plan Note (Addendum)
Discussed with him the nature of baker's cyst.  Given he is symptomatic we discussed arthrocentesis and steroid injection, he was agreeable. 20cc of synovial fluid was aspirated and sent for cell count and crystal exam, fluid was initally clear yellow, however patient moved during tap and fluid became slightly blood tinged, 40mg  of kenalog was then injected with 2cc of 1% lidocaine.  Will plan to reevalaute in about 2 weeks.

## 2017-06-18 LAB — PATHOLOGIST SMEAR REVIEW

## 2017-06-23 ENCOUNTER — Encounter: Payer: Medicare Other | Admitting: *Deleted

## 2017-06-23 ENCOUNTER — Telehealth: Payer: Self-pay | Admitting: Cardiology

## 2017-06-23 NOTE — Telephone Encounter (Signed)
Spoke with pt and reminded pt of remote transmission that is due today. Pt verbalized understanding.   

## 2017-06-24 ENCOUNTER — Encounter: Payer: Self-pay | Admitting: Cardiology

## 2017-07-09 ENCOUNTER — Ambulatory Visit (INDEPENDENT_AMBULATORY_CARE_PROVIDER_SITE_OTHER): Payer: Medicare Other | Admitting: Internal Medicine

## 2017-07-09 ENCOUNTER — Other Ambulatory Visit: Payer: Self-pay

## 2017-07-09 VITALS — BP 141/91 | HR 95 | Temp 98.1°F | Ht 73.0 in | Wt 158.2 lb

## 2017-07-09 DIAGNOSIS — R110 Nausea: Secondary | ICD-10-CM | POA: Diagnosis not present

## 2017-07-09 DIAGNOSIS — J3489 Other specified disorders of nose and nasal sinuses: Secondary | ICD-10-CM | POA: Diagnosis not present

## 2017-07-09 DIAGNOSIS — R634 Abnormal weight loss: Secondary | ICD-10-CM | POA: Diagnosis not present

## 2017-07-09 DIAGNOSIS — Z6820 Body mass index (BMI) 20.0-20.9, adult: Secondary | ICD-10-CM | POA: Diagnosis not present

## 2017-07-09 MED ORDER — ONDANSETRON HCL 4 MG PO TABS: 4.0000 mg | ORAL_TABLET | Freq: Three times a day (TID) | ORAL | 0 refills | Status: DC | PRN

## 2017-07-09 NOTE — Patient Instructions (Addendum)
FOLLOW-UP INSTRUCTIONS When: 2 weeks with your PCP or sooner if symptoms worsen or fail to improve For: Routine visit What to bring: All of your medications   Please continue to drink fluids to remain hydrated. Slowly add solid foods and liquids to your diet. If your symptoms worsen between now and your appointment in two weeks please call us and schedule an appointment.   Thank you for your visit to the [facility].

## 2017-07-09 NOTE — Progress Notes (Signed)
   CC: Nausea   HPI:[name] is a 67 y.o. male who is here today for evaluation of nausea of two days duration.  Nausea: This has been an issue for two days and first occurred while he was outside working in the heat. He left work but did not vomit.  He stated that he was feeling unwell prior to starting work that day but denied fatigue, vomiting, nausea, diarrhea, at that time. Denied diarrhea, constipation, fever, myalgias, chest pain, neck pain, throat pain, diaphoresis, tinnitus, dizziness, room spinning or abdominal pain.  Endorsed, headache initially but better now, a little imbalance, minor chills in the room today but worse when he goes outside, endorsed rhinorrhea Better with not eating, has not self treated with any medications  Worse with eating, he has not been eating much as a results, worse with he gets warm.  Drinking water and fluids without issue.  Denied sick contacts, no recent medications, did not eat any new food as he eats a very selective diet.  Sexually active with one partner.  Attested to eight pounds of weight loss, down 7 lbs in the chart since March 29/2019, this is not intentional. Physical exam is unremarkable except for and under nourished male in his late 31s.  No focal deficits, gait is intact, strength is intact bilaterally upper and lower and symmetric, no prominent murmurs, or abnormal pulmonary sounds auscultated.  His exam is consistent with a possible viral gastroenteritis and mild dehydration.  Plan: Return in 2 weeks to see PCP I encouraged the patient to increase his oral intake of fluids most prominently of water, utilize Gatorade if he begins vomiting.  He was advised to return to our clinic and revisit the emergency department if develops a fever, chest pain, headache, weakness, syncope, or other concerning symptoms. -Zofran 4mg  PO for nausea   Past Medical History:  Diagnosis Date  . CEREBROVASCULAR ACCIDENT 10/17/2008   Acute CVA of  right thalamus 10/03/08 ECHO performed 2/2 CVA 10/09/08, EF 55-65%  Aspirin 325 mg daily and try to work on quitting smoking.   . Emphysema    per multiple CXRs  . Hypertension    goal < 140/90  . Sickle cell trait ([place])   . Snoring    Sleep study 09/02/10 was WNL  . Stroke [facility]) 10/03/08   Right thalamus  . Tuberculosis at age 80-3   Review of Systems:  ROS negative except as per HPI>  Physical Exam:  Vitals:   07/09/17 1523  BP: (!) 141/91  Pulse: 95  Temp: 98.1 F (36.7 C)  TempSrc: Oral  SpO2: 100%  Weight: 158 lb 3.2 oz (71.8 kg)  Height: 6\' 1"  (1.854 m)   Physical Exam  Constitutional: No distress.  Undernourished    HENT:  Head: Normocephalic and atraumatic.  Eyes: Conjunctivae and EOM are normal.  Neck: Normal range of motion.  Cardiovascular: Normal rate and regular rhythm.  No murmur heard. Pulmonary/Chest: Effort normal and breath sounds normal. No stridor. No respiratory distress.  Abdominal: Soft. Bowel sounds are normal. He exhibits no distension.  Musculoskeletal: He exhibits no edema.  Neurological: He is alert.  Skin: Skin is warm. He is not diaphoretic.  Psychiatric: He has a normal mood and affect.  Nursing note and vitals reviewed.   Assessment & Plan:   See Encounters Tab for problem based charting.  Patient discussed with Dr. Rebeca Alert

## 2017-07-12 NOTE — Addendum Note (Signed)
Addended by: Oda Kilts on: 07/12/2017 02:23 PM   Modules accepted: Level of Service

## 2017-07-12 NOTE — Progress Notes (Signed)
Internal Medicine Clinic Attending  Case discussed with Dr. Berline Lopes  at the time of the visit.  We reviewed the resident's history and exam and pertinent patient test results.  I agree with the assessment, diagnosis, and plan of care documented in the resident's note.  Although viral gastroenteritis most likely, concerning that he has lost weight and if symptoms have not improved by follow up visit, more extensive evaluation warranted. No findings to suggest posterior circulation stroke (eg normal gait, Romberg, finger-to-nose, no nystagmus).  Oda Kilts, MD

## 2017-07-12 NOTE — Assessment & Plan Note (Signed)
  Nausea: This has been an issue for two days and first occurred while he was outside working in the heat. He left work but did not vomit.  He stated that he was feeling unwell prior to starting work that day but denied fatigue, vomiting, nausea, diarrhea, at that time. Denied diarrhea, constipation, fever, myalgias, chest pain, neck pain, throat pain, diaphoresis, tinnitus, dizziness, room spinning or abdominal pain.  Endorsed, headache initially but better now, a little imbalance, minor chills in the room today but worse when he goes outside, endorsed rhinorrhea Better with not eating, has not self treated with any medications  Worse with eating, he has not been eating much as a results, worse with he gets warm.  Drinking water and fluids without issue.  Denied sick contacts, no recent medications, did not eat any new food as he eats a very selective diet.  Sexually active with one partner.  Attested to eight pounds of weight loss, down 7 lbs in the chart since March 29/2019, this is not intentional. Physical exam is unremarkable except for and under nourished male in his late 35s.  No focal deficits, gait is intact, strength is intact bilaterally upper and lower and symmetric, no prominent murmurs, or abnormal pulmonary sounds auscultated.  His exam is consistent with a possible viral gastroenteritis and mild dehydration.  Plan: Return in 2 weeks to see PCP I encouraged the patient to increase his oral intake of fluids most prominently of water, utilize Gatorade if he begins vomiting.  He was advised to return to our clinic and revisit the emergency department if develops a fever, chest pain, headache, weakness, syncope, or other concerning symptoms. -Zofran 4mg  PO for nausea

## 2017-07-22 ENCOUNTER — Encounter: Payer: Self-pay | Admitting: Internal Medicine

## 2017-07-22 ENCOUNTER — Encounter: Payer: Medicare Other | Admitting: Internal Medicine

## 2017-08-05 ENCOUNTER — Encounter: Payer: Medicare Other | Admitting: Internal Medicine

## 2017-08-17 ENCOUNTER — Encounter: Payer: Self-pay | Admitting: Cardiology

## 2017-09-17 ENCOUNTER — Ambulatory Visit (INDEPENDENT_AMBULATORY_CARE_PROVIDER_SITE_OTHER): Payer: Medicare Other | Admitting: Internal Medicine

## 2017-09-17 VITALS — BP 172/116 | HR 81 | Temp 98.2°F | Wt 162.7 lb

## 2017-09-17 DIAGNOSIS — R6884 Jaw pain: Secondary | ICD-10-CM

## 2017-09-17 DIAGNOSIS — I10 Essential (primary) hypertension: Secondary | ICD-10-CM

## 2017-09-17 DIAGNOSIS — M26609 Unspecified temporomandibular joint disorder, unspecified side: Secondary | ICD-10-CM | POA: Insufficient documentation

## 2017-09-17 DIAGNOSIS — Z79899 Other long term (current) drug therapy: Secondary | ICD-10-CM

## 2017-09-17 DIAGNOSIS — R51 Headache: Secondary | ICD-10-CM

## 2017-09-17 DIAGNOSIS — F17210 Nicotine dependence, cigarettes, uncomplicated: Secondary | ICD-10-CM

## 2017-09-17 MED ORDER — CYCLOBENZAPRINE HCL 5 MG PO TABS: 5.0000 mg | ORAL_TABLET | Freq: Three times a day (TID) | ORAL | 0 refills | Status: DC | PRN

## 2017-09-17 NOTE — Assessment & Plan Note (Signed)
Assessment:  Patient freely admits to medication non-compliance "for awhile" and admits this is likely the cause of his headaches. He states he just doesn't like taking medications but figures he probably should if he doesn't want to keep getting headaches. He denied any blurred vision, chest pain, SOB or neurologic complaint.      Plan: Pt to resume home BP medications. Will get BMET today to monitor renal function. Pt to RTC 1 month for follow-up.

## 2017-09-17 NOTE — Progress Notes (Signed)
Internal Medicine Clinic Attending  Case discussed with Dr. Molt at the time of the visit.  We reviewed the resident's history and exam and pertinent patient test results.  I agree with the assessment, diagnosis, and plan of care documented in the resident's note. 

## 2017-09-17 NOTE — Assessment & Plan Note (Addendum)
Assessment:  Patient also feels a component of his headache could be related to clenching his jaw. He's noticed right-sided jaw pain just under the ear and has noticed increased muscle tension as well. Improves with massage. Admits to clicking and popping of his jaw as well. Exam with crepitus of TMJ.     Plan: Suspect patient with TMJ disorder related to bruxism or jaw clenching. Discussed being mindful of the tension in his jaw and different topical medications like Biofreeze or Blue Emu. Also recommended patient follow-up with his dentist as well. Given short course of Flexeril to help if OTC not improving symptoms.

## 2017-09-17 NOTE — Patient Instructions (Addendum)
[name], it was a pleasure meeting you today! Thank you for choosing [facility] for your primary care.   Today we talked about several things:  1) Headache: Your headache is most likely related to high blood pressure and also clenching your jaw. Please resume your blood pressure medications when you get home; there are several refills at the pharmacy available.  2) Jaw pain: I suspect you have something called TMJ - this is essentially arthritis of the jaw joint. This is a chronic condition but can be managed with over the counter anti-inflammatories like Naproxen (Aleve) or Tylenol. You can also try a few topical remedies which I use as well which can be applied several times a day:  -Biofreeze  -Blue Emu 3) I've also sent in a short course of a muscle relaxer to be taken as needed for tight muscles in your jaw or neck.   I have ordered labs to check your kidneys; please stop by on your way out!  Please schedule an appointment in 1-2 months with Dr. Heber Mentone to discuss your medical conditions.

## 2017-09-17 NOTE — Progress Notes (Addendum)
   CC: headache, jaw pain and clicking   HPI:  [name] is a 67 y.o. M with medical history as outlined below who presents today to discuss headache and jaw pain/clicking.   For details regarding today's visit and the status of their chronic medical issues, please refer to the assessment and plan.   Past Medical History:  Diagnosis Date  . CEREBROVASCULAR ACCIDENT 10/17/2008   Acute CVA of right thalamus 10/03/08 ECHO performed 2/2 CVA 10/09/08, EF 55-65%  Aspirin 325 mg daily and try to work on quitting smoking.   . Emphysema    per multiple CXRs  . Hypertension    goal < 140/90  . Sickle cell trait ([place])   . Snoring    Sleep study 09/02/10 was WNL  . Stroke [facility]) 10/03/08   Right thalamus  . Tuberculosis at age 22-3   Review of Systems:   General: Admits to headache. Denies fevers, chills, weight loss, fatigue HEENT: Denies changes in vision, sore throat, dysphagia Cardiac: Denies CP, SOB Pulmonary: Denies cough Abd: Denies abdominal pain, nausea, vomiting or changes in bowels Extremities: Denies weakness or swelling  Physical Exam: General: Alert, in no acute distress. Pleasant and conversant HEENT: No icterus, injection or ptosis. No hoarseness or dysarthria. Crepitus right TMJ with opening/closing jaw.  Cardiac: RRR, no murmur Pulmonary: CTA BL with normal WOB on RA. Able to speak in complete sentences Abd: Soft, non-tender Extremities: Warm, perfused. No significant pedal edema.   Vitals:   09/17/17 0933  BP: (!) 172/116  Pulse: 81  Temp: 98.2 F (36.8 C)  TempSrc: Oral  SpO2: 100%  Weight: 162 lb 11.2 oz (73.8 kg)   Body mass index is 21.47 kg/m.  Assessment & Plan:   See Encounters Tab for problem based charting.  Patient discussed with Dr. Dareen Piano

## 2017-09-18 LAB — BMP8+ANION GAP
Anion Gap: 15 mmol/L (ref 10.0–18.0)
BUN/Creatinine Ratio: 10 (ref 10–24)
BUN: 10 mg/dL (ref 8–27)
CO2: 26 mmol/L (ref 20–29)
Calcium: 9.1 mg/dL (ref 8.6–10.2)
Chloride: 102 mmol/L (ref 96–106)
Creatinine, Ser: 1.04 mg/dL (ref 0.76–1.27)
GFR calc Af Amer: 85 mL/min/{1.73_m2} (ref >59)
GFR calc non Af Amer: 73 mL/min/{1.73_m2} (ref >59)
Glucose: 86 mg/dL (ref 65–99)
Potassium: 4.1 mmol/L (ref 3.5–5.2)
Sodium: 143 mmol/L (ref 134–144)

## 2017-10-04 ENCOUNTER — Other Ambulatory Visit: Payer: Self-pay

## 2017-10-04 ENCOUNTER — Ambulatory Visit (INDEPENDENT_AMBULATORY_CARE_PROVIDER_SITE_OTHER): Payer: Medicare Other | Admitting: Internal Medicine

## 2017-10-04 VITALS — BP 124/91 | HR 80 | Temp 98.0°F | Ht 76.25 in | Wt 158.2 lb

## 2017-10-04 DIAGNOSIS — E7849 Other hyperlipidemia: Secondary | ICD-10-CM | POA: Diagnosis not present

## 2017-10-04 DIAGNOSIS — I10 Essential (primary) hypertension: Secondary | ICD-10-CM | POA: Diagnosis not present

## 2017-10-04 MED ORDER — ATORVASTATIN CALCIUM 80 MG PO TABS: 80.0000 mg | ORAL_TABLET | Freq: Every day | ORAL | 6 refills | Status: DC

## 2017-10-04 NOTE — Assessment & Plan Note (Addendum)
BP Readings from Last 3 Encounters:  10/04/17 (!) 124/91  09/17/17 (!) 172/116  07/09/17 (!) 141/91   Patient reports good compliance with his blood pressure medicines over the last 2 weeks.  He brought them and we reconciled his medication list the only one that he was missing was the atorvastatin 80 mg daily.  Patient noticed some increased erectile dysfunction after starting back on his medicine however he is not willing to make any changes he is happy with his blood pressure today and he is not interested in work-up for possible sildenafil in the future because he cannot afford it.    -Continue amlodipine 10, HCTZ 25, carvedilol 3.125 twice daily -Check a BMP today

## 2017-10-04 NOTE — Progress Notes (Signed)
CC: HTN, HLD follow up  HPI:  [name] is a 67 y.o. male with PMH below.  He is here for a  HTN, HLD follow up  Please see A&P for status of the patient's chronic medical conditions  Past Medical History:  Diagnosis Date  . CEREBROVASCULAR ACCIDENT 10/17/2008   Acute CVA of right thalamus 10/03/08 ECHO performed 2/2 CVA 10/09/08, EF 55-65%  Aspirin 325 mg daily and try to work on quitting smoking.   . Emphysema    per multiple CXRs  . Hypertension    goal < 140/90  . Sickle cell trait ([place])   . Snoring    Sleep study 09/02/10 was WNL  . Stroke [facility]) 10/03/08   Right thalamus  . Tuberculosis at age 27-3   Review of Systems:  ROS: Pulmonary: pt denies increased work of breathing, shortness of breath,  Cardiac: pt denies palpitations, chest pain,  Abdominal: pt denies abdominal pain, nausea, vomiting, or diarrhea  Physical Exam:  Vitals:   10/04/17 0838  BP: (!) 124/91  Pulse: 80  Temp: 98 F (36.7 C)  TempSrc: Oral  SpO2: 100%  Weight: 158 lb 3.2 oz (71.8 kg)  Height: 6' 4.25" (1.937 m)   Physical Exam  Eyes: Right eye exhibits no discharge. Left eye exhibits no discharge. No scleral icterus.  Cardiovascular: Normal rate, regular rhythm and normal heart sounds. Exam reveals no gallop and no friction rub.  No murmur heard. Pulmonary/Chest: Effort normal and breath sounds normal. No respiratory distress. He has no wheezes. He has no rales.  Neurological: He is alert.  Skin: He is not diaphoretic.    Social History   Socioeconomic History  . Marital status: Married    Spouse name: Not on file  . Number of children: Not on file  . Years of education: Not on file  . Highest education level: Not on file  Occupational History  . Occupation: unemployed  Social Needs  . Financial resource strain: Not on file  . Food insecurity:    Worry: Not on file    Inability: Not on file  . Transportation needs:    Medical: Not on file    Non-medical: Not on file    Tobacco Use  . Smoking status: Current Every Day Smoker    Packs/day: 0.30    Years: 10.00    Pack years: 3.00    Types: Cigarettes  . Smokeless tobacco: Never Used  . Tobacco comment: Cutting back. DOWN TO 2-3 CIAGARETTES  Substance and Sexual Activity  . Alcohol use: No    Alcohol/week: 0.0 oz  . Drug use: No    Comment: previous polysubstance abuser, quit x 13 years  . Sexual activity: Not on file  Lifestyle  . Physical activity:    Days per week: Not on file    Minutes per session: Not on file  . Stress: Not on file  Relationships  . Social connections:    Talks on phone: Not on file    Gets together: Not on file    Attends religious service: Not on file    Active member of club or organization: Not on file    Attends meetings of clubs or organizations: Not on file    Relationship status: Not on file  . Intimate partner violence:    Fear of current or ex partner: Not on file    Emotionally abused: Not on file    Physically abused: Not on file    Forced  sexual activity: Not on file  Other Topics Concern  . Not on file  Social History Narrative   Lives in [place].  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 Cancer Sister   . Hypertension Mother   . Sickle cell anemia Son     Assessment & Plan:   See Encounters Tab for problem based charting.  Patient discussed with Dr. Daryll Drown

## 2017-10-04 NOTE — Assessment & Plan Note (Signed)
Lab Results  Component Value Date   CHOL 238 (H) 04/23/2016   HDL 58 04/23/2016   LDLCALC 161 (H) 04/23/2016   TRIG 94 04/23/2016   CHOLHDL 4.1 04/23/2016   Patient has not been taking his Lipitor for at least 8 months maybe longer he has known vascular disease and is at high risk for complications from this.    -Refilled atorvastatin 80 mg -Check lipid panel in 6 weeks for response

## 2017-10-04 NOTE — Patient Instructions (Signed)
Mr. Baetz I have restarted your lipitor today.  Please come back in 6 weeks so we can recheck your cholesterol and blood pressure.  Continue your current blood pressure medicines.

## 2017-10-05 ENCOUNTER — Encounter: Payer: Self-pay | Admitting: Internal Medicine

## 2017-10-05 ENCOUNTER — Ambulatory Visit: Payer: Medicare Other

## 2017-10-05 LAB — BMP8+ANION GAP
ANION GAP: 18 mmol/L (ref 10.0–18.0)
BUN / CREAT RATIO: 22 (ref 10–24)
BUN: 23 mg/dL (ref 8–27)
CO2: 25 mmol/L (ref 20–29)
Calcium: 9.4 mg/dL (ref 8.6–10.2)
Chloride: 96 mmol/L (ref 96–106)
Creatinine, Ser: 1.03 mg/dL (ref 0.76–1.27)
GFR calc Af Amer: 86 mL/min/{1.73_m2} (ref >59)
GFR calc non Af Amer: 74 mL/min/{1.73_m2} (ref >59)
Glucose: 98 mg/dL (ref 65–99)
POTASSIUM: 3.9 mmol/L (ref 3.5–5.2)
SODIUM: 139 mmol/L (ref 134–144)

## 2017-10-05 NOTE — Progress Notes (Signed)
Sent pt letter outlining results

## 2017-10-07 NOTE — Progress Notes (Signed)
Internal Medicine Clinic Attending  Case discussed with Dr. Winfrey  at the time of the visit.  We reviewed the resident's history and exam and pertinent patient test results.  I agree with the assessment, diagnosis, and plan of care documented in the resident's note.  

## 2017-11-04 ENCOUNTER — Telehealth: Payer: Self-pay | Admitting: Internal Medicine

## 2017-11-04 NOTE — Telephone Encounter (Signed)
Patient called about [employer] machine not connecting properly to send a reading. Would like a call back or he would like to come in to get someone to look at it

## 2017-11-04 NOTE — Telephone Encounter (Signed)
Spoke with pt number to [employer] technical services given for troubleshooting of monitor.

## 2017-11-29 ENCOUNTER — Ambulatory Visit (INDEPENDENT_AMBULATORY_CARE_PROVIDER_SITE_OTHER): Payer: Medicare Other | Admitting: *Deleted

## 2017-11-29 DIAGNOSIS — Z23 Encounter for immunization: Secondary | ICD-10-CM

## 2017-12-03 ENCOUNTER — Encounter: Payer: Medicare Other | Admitting: *Deleted

## 2017-12-06 ENCOUNTER — Encounter: Payer: Self-pay | Admitting: Cardiology

## 2017-12-28 ENCOUNTER — Encounter: Payer: Self-pay | Admitting: Cardiology

## 2018-01-06 ENCOUNTER — Ambulatory Visit (INDEPENDENT_AMBULATORY_CARE_PROVIDER_SITE_OTHER): Payer: Medicare Other | Admitting: *Deleted

## 2018-01-06 DIAGNOSIS — I10 Essential (primary) hypertension: Secondary | ICD-10-CM

## 2018-01-06 DIAGNOSIS — I495 Sick sinus syndrome: Secondary | ICD-10-CM | POA: Diagnosis not present

## 2018-01-07 ENCOUNTER — Other Ambulatory Visit: Payer: Self-pay

## 2018-01-07 ENCOUNTER — Ambulatory Visit (INDEPENDENT_AMBULATORY_CARE_PROVIDER_SITE_OTHER): Payer: Medicare Other | Admitting: Internal Medicine

## 2018-01-07 VITALS — BP 170/102 | HR 85 | Temp 98.6°F | Ht 76.5 in | Wt 163.4 lb

## 2018-01-07 DIAGNOSIS — I10 Essential (primary) hypertension: Secondary | ICD-10-CM | POA: Diagnosis not present

## 2018-01-07 DIAGNOSIS — Z79899 Other long term (current) drug therapy: Secondary | ICD-10-CM

## 2018-01-07 DIAGNOSIS — M7122 Synovial cyst of popliteal space [Baker], left knee: Secondary | ICD-10-CM | POA: Diagnosis not present

## 2018-01-07 DIAGNOSIS — M1712 Unilateral primary osteoarthritis, left knee: Secondary | ICD-10-CM | POA: Diagnosis not present

## 2018-01-07 DIAGNOSIS — F17210 Nicotine dependence, cigarettes, uncomplicated: Secondary | ICD-10-CM

## 2018-01-07 LAB — SYNOVIAL CELL COUNT + DIFF, W/ CRYSTALS
Crystals, Fluid: NONE SEEN
EOSINOPHILS-SYNOVIAL: 1 % (ref 0–1)
LYMPHOCYTES-SYNOVIAL FLD: 32 % — AB (ref 0–20)
Monocyte-Macrophage-Synovial Fluid: 40 % — ABNORMAL LOW (ref 50–90)
NEUTROPHIL, SYNOVIAL: 27 % — AB (ref 0–25)
WBC, Synovial: 24 /mm3 (ref 0–200)

## 2018-01-07 NOTE — Progress Notes (Signed)
CC: fluid in the knee  HPI:  [name] is a 67 y.o. with PMH as listed below who presents for fluid in the knee. Please see the assessment and plans for the status of the patient chronic medical problems.   Past Medical History:  Diagnosis Date  . CEREBROVASCULAR ACCIDENT 10/17/2008   Acute CVA of right thalamus 10/03/08 ECHO performed 2/2 CVA 10/09/08, EF 55-65%  Aspirin 325 mg daily and try to work on quitting smoking.   . Emphysema    per multiple CXRs  . Hypertension    goal < 140/90  . Sickle cell trait ([place])   . Snoring    Sleep study 09/02/10 was WNL  . Stroke [facility]) 10/03/08   Right thalamus  . Tuberculosis at age 42-3   Review of Systems:  Refer to history of present illness and assessment and plans for pertinent review of systems, all others reviewed and negative  Physical Exam:  Vitals:   01/07/18 1021  BP: (!) 170/102  Pulse: 85  Temp: 98.6 F (37 C)  TempSrc: Oral  SpO2: 100%  Weight: 163 lb 6.4 oz (74.1 kg)  Height: 6' 4.5" (1.943 m)   General: well appearing, no acute distress  MSK: there is a moderate size effusion evident in the left knee, tenderness with palpation of the lateral joint line, there is no swelling in the left leg   Assessment & Plan:   Swelling of the left knee  Patient presents with progressively worsening swelling, and pain in the left knee over the past week. He has had a similar issue once about 6 months ago. Doppler exam at the time diagnosed a large popliteal cyst which was treated with arthrocentesis and steroid injection. Analysis of the fluid found no crystals and there was an mildly elevated white count consistent with inflammation. After the injection he had no issues with the knee until now. He denies other joint involvement. Recurrent baker's cyst likely secondary to underlying osteoarthritis.  - arthrocentesis performed today the fluid will be sent for cell count and crystal analysis  Hypertension  He has not taken  his blood pressure medications today, blood pressure is 170/102. He believes his morning coffee may be contributing as well. He will take his antihypertensive when he returns home today. Previously blood pressure has been controlled on current medication combination.   - continue amlodipine 10, HCTZ 25, carvedilol 3.125 mg daily   PROCEDURE NOTE  PROCEDURE: left knee joint steroid injection.  PREOPERATIVE DIAGNOSIS: baker cyst of the left knee.  POSTOPERATIVE DIAGNOSIS: baker cyst of the left knee.  PROCEDURE: The patient was apprised of the risks and the benefits of the procedure and informed consent was obtained, as witnessed by [employer]. Time-out procedure was performed, with confirmation of the patient's name, date of birth, and correct identification of the left knee to be injected. The patient's knee was then marked at the appropriate site for injection placement. The knee was sterilely prepped with Betadine. A 40 mg solution of Kenalog was drawn up into a 5 mL syringe with a 2 mL of 1% lidocaine. The patient was injected with a 25-gauge needle at the superior lateral aspect of his left flexed knee. There were no complications. The patient tolerated the procedure well. There was minimal bleeding. The patient was instructed to ice his knee upon leaving clinic and refrain from overuse over the next 3 days. The patient was instructed to go to the emergency room with any usual pain,  swelling, or redness occurred in the injected area. The patient was given a followup appointment to evaluate response to the injection to his increased range of motion and reduction of pain.  The procedure was supervised by attending physician, Dr. Dareen Piano.   See Encounters Tab for problem based charting.  Patient seen with Dr. Dareen Piano

## 2018-01-07 NOTE — Progress Notes (Signed)
Remote pacemaker transmission.   

## 2018-01-14 ENCOUNTER — Encounter: Payer: Self-pay | Admitting: Cardiology

## 2018-01-17 NOTE — Progress Notes (Signed)
Internal Medicine Clinic Attending  I saw and evaluated the patient.  I personally confirmed the key portions of the history and exam documented by Dr. Hetty Ely and I reviewed pertinent patient test results.  The assessment, diagnosis, and plan were formulated together and I agree with the documentation in the resident's note.  I supervised arthrocentesis done in [facility] by Dr. Hetty Ely. Procedure was performed for likely recurrenty Bakers cyst of the left knee. Please refer to the procedure note for details.

## 2018-01-21 ENCOUNTER — Other Ambulatory Visit: Payer: Self-pay

## 2018-01-21 ENCOUNTER — Encounter: Payer: Self-pay | Admitting: Internal Medicine

## 2018-01-21 ENCOUNTER — Ambulatory Visit (INDEPENDENT_AMBULATORY_CARE_PROVIDER_SITE_OTHER): Payer: Medicare Other | Admitting: Internal Medicine

## 2018-01-21 VITALS — BP 154/102 | HR 81 | Temp 97.9°F | Ht 76.25 in | Wt 164.3 lb

## 2018-01-21 DIAGNOSIS — M7989 Other specified soft tissue disorders: Secondary | ICD-10-CM | POA: Diagnosis not present

## 2018-01-21 DIAGNOSIS — Z720 Tobacco use: Secondary | ICD-10-CM | POA: Diagnosis not present

## 2018-01-21 DIAGNOSIS — M7122 Synovial cyst of popliteal space [Baker], left knee: Secondary | ICD-10-CM

## 2018-01-21 NOTE — Progress Notes (Signed)
   CC: left calf pain   HPI:  [name] is a 67 y.o. with PMH as listed below who presents for left calf pain. Please see the assessment and plans for the status of the patient chronic medical problems.    Past Medical History:  Diagnosis Date  . CEREBROVASCULAR ACCIDENT 10/17/2008   Acute CVA of right thalamus 10/03/08 ECHO performed 2/2 CVA 10/09/08, EF 55-65%  Aspirin 325 mg daily and try to work on quitting smoking.   . Emphysema    per multiple CXRs  . Hypertension    goal < 140/90  . Sickle cell trait ([place])   . Snoring    Sleep study 09/02/10 was WNL  . Stroke [facility]) 10/03/08   Right thalamus  . Tuberculosis at age 29-3   Review of Systems:  Refer to history of present illness and assessment and plans for pertinent review of systems, all others reviewed and negative  Physical Exam:  Vitals:   01/21/18 1600  BP: (!) 154/102  Pulse: 81  Temp: 97.9 F (36.6 C)  TempSrc: Oral  SpO2: 100%  Weight: 164 lb 4.8 oz (74.5 kg)  Height: 6' 4.25" (1.937 m)   MSK: the left calf is tender to palpation over the posterior aspect, there is edema in the left leg greater than right, and increased calf diameter, there is no longer an effusion evident in the left knee but there is fullness with palpation of the left popliteal fossa, homans sign is positive    Assessment & Plan:   Left calf pain  Patient presents for follow up after left knee arthrocentesis and steroid injection. The pain in the left knee was resolved for four days after the injection but then he developed pain and swelling in the left calf. Wells score for DVT is 0 (tenderness along the posterior calf +1, pitting edema +1, other diagnosis more likely -2). Presentation is concerning for aneurysm of the bakers cyst, we will also need to rule out DVT although the concern for this is lower. Unfortunately this is Friday at 4 pm and the ultrasound lab is no longer seeing patients and [name] has declined evaluation in the  ED or inpatient. We were able to get an ultrasound scheduled for first thing Monday morning and have discussed strict return precautions for progression of his symptoms prior to that time.   See Encounters Tab for problem based charting.  Patient discussed with Dr. Lynnae January

## 2018-01-21 NOTE — Assessment & Plan Note (Signed)
Patient presents for follow up after left knee arthrocentesis and steroid injection. The pain in the left knee was resolved for four days after the injection but then he developed pain and swelling in the left calf. Wells score for DVT is 0 (tenderness along the posterior calf +1, pitting edema +1, other diagnosis more likely -2). Presentation is concerning for aneurysm of the bakers cyst, we will also need to rule out DVT although the concern for this is lower. Unfortunately this is Friday at 4 pm and the ultrasound lab is no longer seeing patients and [name] has declined evaluation in the ED or inpatient. We were able to get an ultrasound scheduled for first thing Monday morning and have discussed strict return precautions for progression of his symptoms prior to that time.

## 2018-01-21 NOTE — Patient Instructions (Addendum)
Thank you for coming to the clinic today. It was a pleasure to see you.   For your lower leg swelling - please use ice and ibuprofen for pain relief over the weekend.  Someone will contact you about getting an ultrasound first thing Monday morning.  If the pain becomes severe, the swelling of your calf worsens, or you start to feel numbness or tingling in your leg please go to the emergency department   Please call the internal medicine center clinic if you have any questions or concerns, we may be able to help and keep you from a long and expensive emergency room wait. Our clinic and after hours phone number is [phone], the best time to call is Monday through Friday 9 am to 4 pm but there is always someone available 24/7 if you have an emergency. If you need medication refills please notify your pharmacy one week in advance and they will send [place] a request.

## 2018-01-24 ENCOUNTER — Ambulatory Visit (HOSPITAL_COMMUNITY)
Admission: RE | Admit: 2018-01-24 | Discharge: 2018-01-24 | Disposition: A | Discharge status: Home / Self Care | Payer: Medicare Other | Source: Ambulatory Visit | Attending: Internal Medicine | Admitting: Internal Medicine

## 2018-01-24 DIAGNOSIS — M7122 Synovial cyst of popliteal space [Baker], left knee: Secondary | ICD-10-CM | POA: Diagnosis not present

## 2018-01-24 NOTE — Progress Notes (Signed)
Left lower extremity venous duplex completed - Preliminary results - There is no evidence of a DVT. There is evidence of a possible chronic Baker's cyst and a new collection of fluid more distally at the medial proximal calf.  [place] Armond Cuthrell,RVS 01/24/2018 10:06 AM

## 2018-01-26 ENCOUNTER — Other Ambulatory Visit: Payer: Self-pay

## 2018-01-26 ENCOUNTER — Ambulatory Visit (INDEPENDENT_AMBULATORY_CARE_PROVIDER_SITE_OTHER): Payer: Medicare Other | Admitting: Internal Medicine

## 2018-01-26 DIAGNOSIS — M25462 Effusion, left knee: Secondary | ICD-10-CM | POA: Diagnosis not present

## 2018-01-26 DIAGNOSIS — Z720 Tobacco use: Secondary | ICD-10-CM

## 2018-01-26 DIAGNOSIS — M25562 Pain in left knee: Secondary | ICD-10-CM

## 2018-01-26 NOTE — Progress Notes (Signed)
   CC: Acute knee pain  HPI:[name] is a 67 y.o. male who presents for evaluation of acute knee swelling. Please see individual problem based A/P for details.  Left posterior knee pain: Two weeks of left posterior knee pain described as a pulling sensation when he walks in the left posterior calf, previously tapped and injected due to a diagnosis of bakers cyst. However, despite initial improvement 4 days out from treatment the pain had returned. [place] for DVT was negative but revealed a prolonged structure in his posterior calf consistent with a possible gastrocnemius cyst. Bedside US today is also consistent with a possible gastroc cyst and will be treated conservatively.  Patient is advised that if his pain worsens, fails to improve or otherwise becomes an issue that he is to return for consideration of an MRI to better evaluate and enable a more definitive diagnosis.   Past Medical History:  Diagnosis Date  . CEREBROVASCULAR ACCIDENT 10/17/2008   Acute CVA of right thalamus 10/03/08 ECHO performed 2/2 CVA 10/09/08, EF 55-65%  Aspirin 325 mg daily and try to work on quitting smoking.   . Emphysema    per multiple CXRs  . Hypertension    goal < 140/90  . Sickle cell trait ([place])   . Snoring    Sleep study 09/02/10 was WNL  . Stroke [facility]) 10/03/08   Right thalamus  . Tuberculosis at age 71-3   Review of Systems:  ROS negative except as per HPI.  Physical Exam: Vitals:   01/26/18 1456  BP: (!) 148/97  Pulse: 80  Temp: 97.9 F (36.6 C)  TempSrc: Oral  SpO2: 100%  Weight: 166 lb (75.3 kg)   General: A/O x 4, in no acute distress, afebrile, nondiaphoretic MSK: Unremarkable exam of the left posterior knee, left calf is slightly 1.5cm larger than the right proximally, there is no palpable mass or nodule or cyst subcutaneously. There is a small incidental <0.3cm suprapatellar effusion and the small cystic structure of the posterior knee and gastroc at about 3cm of depth and ~1cm  circumferential in nature.   Assessment & Plan:   See Encounters Tab for problem based charting.  Patient seen with Dr. Evette Doffing

## 2018-01-26 NOTE — Assessment & Plan Note (Signed)
Left posterior knee pain: Two weeks of left posterior knee pain described as a pulling sensation when he walks in the left posterior calf, previously tapped and injected due to a diagnosis of bakers cyst. However, despite initial improvement 4 days out from treatment the pain had returned. [place] for DVT was negative but revealed a prolonged structure in his posterior calf consistent with a possible gastrocnemius cyst. Bedside US today is also consistent with a possible gastroc cyst and will be treated conservatively.  Patient is advised that if his pain worsens, fails to improve or otherwise becomes an issue that he is to return for consideration of an MRI to better evaluate and enable a more definitive diagnosis.

## 2018-01-26 NOTE — Patient Instructions (Addendum)
FOLLOW-UP INSTRUCTIONS When: If your pain worsens or fails to improve What to bring: All of your medications  I have not made any changes to your medications today.   Today we discussed your left posterior knee pain. I feel that continue cautious care and watchful monitoring are appropriate at this time. If your symptoms worsen, fail to resolve or alter your life please return to see [place]. For now continue to rest the area as needed and use low dose tylenol 650mg  twice daily or low dose ibuprofen (400mg  twice daily at most) if beneficial.  Thank you for your visit to the [facility] today. If you have any questions or concerns please call us at [phone].

## 2018-01-26 NOTE — Progress Notes (Signed)
Internal Medicine Clinic Attending  I saw and evaluated the patient.  I personally confirmed the key portions of the history and exam documented by Dr. Hetty Ely and I reviewed pertinent patient test results.  The assessment, diagnosis, and plan were formulated together and I agree with the documentation in the resident's note.   The Doppler showed a cystic appearing structure at the medial distal knee which was present in March of this year but now extending 7.1 cm into the calf. Also a new fluid collection in the medial proximal calf measuring 0.73 cm x 1.95 cm. He has an [facility] appt today to discuss these findings.

## 2018-01-27 NOTE — Progress Notes (Signed)
Internal Medicine Clinic Attending  I saw and evaluated the patient.  I personally confirmed the key portions of the history and exam documented by Dr. Harbrecht and I reviewed pertinent patient test results.  The assessment, diagnosis, and plan were formulated together and I agree with the documentation in the resident's note.  

## 2018-02-18 ENCOUNTER — Encounter: Payer: Self-pay | Admitting: Internal Medicine

## 2018-02-18 ENCOUNTER — Ambulatory Visit (INDEPENDENT_AMBULATORY_CARE_PROVIDER_SITE_OTHER): Payer: Medicare Other | Admitting: Internal Medicine

## 2018-02-18 ENCOUNTER — Other Ambulatory Visit: Payer: Self-pay

## 2018-02-18 VITALS — BP 157/97 | HR 85 | Temp 98.0°F | Ht 73.0 in | Wt 168.1 lb

## 2018-02-18 DIAGNOSIS — M25562 Pain in left knee: Secondary | ICD-10-CM

## 2018-02-18 DIAGNOSIS — F17210 Nicotine dependence, cigarettes, uncomplicated: Secondary | ICD-10-CM

## 2018-02-18 DIAGNOSIS — M7122 Synovial cyst of popliteal space [Baker], left knee: Secondary | ICD-10-CM

## 2018-02-18 MED ORDER — AMLODIPINE BESYLATE 10 MG PO TABS: 10.0000 mg | ORAL_TABLET | Freq: Every day | ORAL | 3 refills | Status: DC

## 2018-02-18 MED ORDER — HYDROCHLOROTHIAZIDE 25 MG PO TABS: 25.0000 mg | ORAL_TABLET | Freq: Every day | ORAL | 3 refills | Status: DC

## 2018-02-18 NOTE — Patient Instructions (Signed)
Mr. Snellings,  I am sorry to hear you are still having left knee pain. I have sent in a referral to Sports Medicine and also ordered and MRI of your left knee and left leg. Someone should be in touch with you soon to schedule an appointment with Sports Medicine. You can come any day to [facility] to get your MRI done in the meantime.

## 2018-02-18 NOTE — Progress Notes (Signed)
   CC: left posterior knee and calf pain  HPI:  [name] is a 67 y.o. male with a PMHx listed below presenting to the clinic for ongoing posterior knee pain.  Patient initially started having left posterior knee pain in 03/19 and found to have a baker's cyst likely secondary to underlying osteoarthritis. He had an arthrocentesis and steroid injection in April which provided some relief. He started having progressively worsening swelling and pain in the left knee again in 11/19 and had another arthrocentesis performed and a steroid injection. He then started having two weeks of left posterior knee pain described as a pulling sensation when he walks in the left posterior calf. [place] for DVT was negative but revealed a prolonged structure in his posterior calf consistent with a possible gastrocnemius cyst. Bedside US today is also consistent with a possible gastroc cyst and will be treated conservatively. Patient was advised that if his pain worsens, fails to improve or otherwise becomes an issue that he is to return for consideration of an MRI to better evaluate and enable a more definitive diagnosis.  For details of today's visit and the status of his chronic medical issues please refer to the assessment and plan.   Past Medical History:  Diagnosis Date  . CEREBROVASCULAR ACCIDENT 10/17/2008   Acute CVA of right thalamus 10/03/08 ECHO performed 2/2 CVA 10/09/08, EF 55-65%  Aspirin 325 mg daily and try to work on quitting smoking.   . Emphysema    per multiple CXRs  . Hypertension    goal < 140/90  . Sickle cell trait ([place])   . Snoring    Sleep study 09/02/10 was WNL  . Stroke [facility]) 10/03/08   Right thalamus  . Tuberculosis at age 84-3   Review of Systems:   Review of Systems  Cardiovascular: Negative for leg swelling.  Musculoskeletal: Positive for joint pain and myalgias.  Neurological: Negative for tingling, sensory change, focal weakness and weakness.    Physical Exam:  There  were no vitals filed for this visit.  Physical Exam  Constitutional: He is oriented to person, place, and time and well-developed, well-nourished, and in no distress.  Cardiovascular: Normal rate, regular rhythm and normal heart sounds.  No murmur heard. Pulmonary/Chest: Effort normal and breath sounds normal. No respiratory distress.  Abdominal: Soft. Bowel sounds are normal. He exhibits no distension. There is no abdominal tenderness.  Musculoskeletal:        General: No edema.     Comments: Left knee is more swollen than right, tender to palpation on posterior knee   Neurological: He is alert and oriented to person, place, and time.  Skin: Skin is warm and dry.  Psychiatric: Mood, memory, affect and judgment normal.    Assessment & Plan:   See Encounters Tab for problem based charting.  Patient seen with Dr. Beryle Beams

## 2018-02-18 NOTE — Assessment & Plan Note (Addendum)
Patient initially started having left posterior knee pain in 03/19 and found to have a baker's cyst likely secondary to underlying osteoarthritis. He had an arthrocentesis and steroid injection in April which provided some relief. He started having progressively worsening swelling and pain in the left knee again in 11/19 and had another arthrocentesis performed and a steroid injection. He then started having two weeks of left posterior knee pain described as a pulling sensation when he walks in the left posterior calf. [place] for DVT was negative but revealed a prolonged structure in his posterior calf consistent with a possible gastrocnemius cyst. Bedside US today is also consistent with a possible gastroc cyst and will be treated conservatively. Patient was advised that if his pain worsens, fails to improve or otherwise becomes an issue that he is to return for consideration of an MRI to better evaluate and enable a more definitive diagnosis.  Patient reported   Plan: -MRI of left knee and left tibia fibula  -Ambulatory referral to Sports Medicine

## 2018-02-25 NOTE — Progress Notes (Signed)
Medicine attending: I personally interviewed and briefly examined this patient on the day of the patient visit and reviewed pertinent clinical ,laboratory, and radiographic data  with resident physician Dr. Harlow Ohms and we discussed a management plan. Persistent symptoms not improved after arthocentesis x 2 and intra-articular steroid injections. We will refer to Sports medicine/ortho at this point.

## 2018-03-06 LAB — CUP PACEART REMOTE DEVICE CHECK
Battery Impedance: 977 Ohm
Battery Remaining Longevity: 66 mo
Battery Voltage: 2.78 V
Brady Statistic AP VS Percent: 40 %
Brady Statistic AS VP Percent: 0 %
Brady Statistic AS VS Percent: 59 %
Date Time Interrogation Session: 20191031112157
Implantable Lead Implant Date: 20101105
Implantable Lead Location: 753859
Implantable Lead Model: 5076
Implantable Lead Model: 5076
Implantable Pulse Generator Implant Date: 20101105
Lead Channel Impedance Value: 426 Ohm
Lead Channel Pacing Threshold Amplitude: 0.75 V
Lead Channel Pacing Threshold Amplitude: 0.875 V
Lead Channel Pacing Threshold Pulse Width: 0.4 ms
Lead Channel Pacing Threshold Pulse Width: 0.4 ms
Lead Channel Setting Pacing Amplitude: 2 V
Lead Channel Setting Pacing Pulse Width: 0.4 ms
Lead Channel Setting Sensing Sensitivity: 2.8 mV
MDC IDC LEAD IMPLANT DT: 20101105
MDC IDC LEAD LOCATION: 753860
MDC IDC MSMT LEADCHNL RA IMPEDANCE VALUE: 447 Ohm
MDC IDC SET LEADCHNL RV PACING AMPLITUDE: 2.5 V
MDC IDC STAT BRADY AP VP PERCENT: 0 %

## 2018-03-08 ENCOUNTER — Ambulatory Visit (HOSPITAL_COMMUNITY)
Admission: RE | Admit: 2018-03-08 | Discharge: 2018-03-08 | Disposition: A | Discharge status: Home / Self Care | Payer: Medicare Other | Source: Ambulatory Visit | Attending: Internal Medicine | Admitting: Internal Medicine

## 2018-03-08 ENCOUNTER — Encounter (HOSPITAL_COMMUNITY): Payer: Self-pay

## 2018-03-08 DIAGNOSIS — M7122 Synovial cyst of popliteal space [Baker], left knee: Secondary | ICD-10-CM

## 2018-03-08 DIAGNOSIS — M25562 Pain in left knee: Secondary | ICD-10-CM | POA: Insufficient documentation

## 2018-03-14 NOTE — Progress Notes (Signed)
Hey Dr. Dareen Piano,   I got a message from radiology that the patient had a pacemaker that is not MRI safe. The appointment was cancelled so was not sure how to proceed. He had two arthrocentesis' and steroid injections without improvement and he was referred to sports med during the last clinic visit.   Let me know if there is anything else I can do in the interim.  Thanks, Areeg

## 2018-03-22 ENCOUNTER — Ambulatory Visit (INDEPENDENT_AMBULATORY_CARE_PROVIDER_SITE_OTHER): Payer: 59 | Admitting: Internal Medicine

## 2018-03-22 ENCOUNTER — Encounter: Payer: Self-pay | Admitting: Internal Medicine

## 2018-03-22 ENCOUNTER — Other Ambulatory Visit: Payer: Self-pay

## 2018-03-22 DIAGNOSIS — M25562 Pain in left knee: Secondary | ICD-10-CM

## 2018-03-22 DIAGNOSIS — M7122 Synovial cyst of popliteal space [Baker], left knee: Secondary | ICD-10-CM

## 2018-03-22 MED ORDER — DICLOFENAC POTASSIUM 25 MG PO CAPS: 1.0000 | ORAL_CAPSULE | Freq: Two times a day (BID) | ORAL | 0 refills | Status: DC | PRN

## 2018-03-22 NOTE — Assessment & Plan Note (Addendum)
Patient presented to clinic, complaining that he still has left knee pain particularly when getting up from sitting position.  He does not have any other joint pian. No fever or chills. On exam, has swelling and crepitus of left knee. No warmth or redness. ROM is intact.  No laxity, Drawer test is negative.  Baker cyst is palpable under left knee.  His clinical presentation is consistent with left knee arthritis. Will do x-ray. He has an acpproved sport medicine referral that has not made it yet since they where unable to contact the patient.  -Left Knee X ray (Standing AP, Standing flex PA, Sunrise view) -Diclofenac potassium 25 mg BID PRN  -May consider orthopedic referral in future if worsens. -Patient will follow up with his PCP Dr. Johnnette Gourd

## 2018-03-22 NOTE — Progress Notes (Signed)
   CC: Left leg swelling and pain  HPI:  [name] is a 68 y.o.  male with past medical history listed below, presented to clinic for worsening of left leg pain and swelling. He was seen before in due to similar problem.   Past Medical History:  Diagnosis Date  . CEREBROVASCULAR ACCIDENT 10/17/2008   Acute CVA of right thalamus 10/03/08 ECHO performed 2/2 CVA 10/09/08, EF 55-65%  Aspirin 325 mg daily and try to work on quitting smoking.   . Emphysema    per multiple CXRs  . Hypertension    goal < 140/90  . Sickle cell trait ([place])   . Snoring    Sleep study 09/02/10 was WNL  . Stroke [facility]) 10/03/08   Right thalamus  . Tuberculosis at age 51-3   Review of Systems:  Review of Systems  Constitutional: Negative for chills and fever.  Cardiovascular: Negative for chest pain.  Gastrointestinal: Negative for abdominal pain and diarrhea.    Physical Exam:  There were no vitals filed for this visit. Physical Exam Constitutional:      General: He is not in acute distress. HENT:     Head: Normocephalic and atraumatic.  Eyes:     Extraocular Movements: Extraocular movements intact.  Cardiovascular:     Rate and Rhythm: Normal rate and regular rhythm.     Heart sounds: No murmur.  Pulmonary:     Effort: Pulmonary effort is normal.     Breath sounds: Normal breath sounds. No wheezing or rales.  Abdominal:     Palpations: Abdomen is soft.     Tenderness: There is no abdominal tenderness.  Musculoskeletal:        General: Swelling and tenderness present.  Skin:    Findings: No rash.  Neurological:     Mental Status: He is alert and oriented to person, place, and time. Mental status is at baseline.     Assessment & Plan:   See Encounters Tab for problem based charting.  Patient discussed with Dr. Evette Doffing

## 2018-03-22 NOTE — Patient Instructions (Addendum)
Thank you for allowing [place] to provide your care today. Today we discussed your knee pain. Your exam showes evidence of osteoarthritis. I send an order for knee x-ray and will call you with the result. I also prescribe Diclofenac for your pain. Please take it only as needed for moderate to sever pain. Always take it with meal. I do not recommend steroid injection today. Today we did not make any other changes to rest of your medications.   If worsening of your symptoms, please follow up in our clinic. We may refer you to orthopedic surgeon in further for further management if needed.  Should you have any questions or concerns please call the internal medicine clinic at [phone].     Thank you.

## 2018-03-23 NOTE — Progress Notes (Signed)
Internal Medicine Clinic Attending  Case discussed with Dr. Masoudi  at the time of the visit.  We reviewed the resident's history and exam and pertinent patient test results.  I agree with the assessment, diagnosis, and plan of care documented in the resident's note.  

## 2018-03-29 NOTE — Addendum Note (Signed)
Addended by: Mike Craze on: 03/29/2018 02:00 PM   Modules accepted: Orders

## 2018-04-06 ENCOUNTER — Ambulatory Visit (INDEPENDENT_AMBULATORY_CARE_PROVIDER_SITE_OTHER): Payer: 59 | Admitting: Sports Medicine

## 2018-04-06 ENCOUNTER — Encounter: Payer: Self-pay | Admitting: Sports Medicine

## 2018-04-06 VITALS — BP 132/94 | Ht 73.0 in | Wt 167.0 lb

## 2018-04-06 DIAGNOSIS — M7122 Synovial cyst of popliteal space [Baker], left knee: Secondary | ICD-10-CM | POA: Insufficient documentation

## 2018-04-06 DIAGNOSIS — M1711 Unilateral primary osteoarthritis, right knee: Secondary | ICD-10-CM | POA: Diagnosis not present

## 2018-04-06 MED ORDER — METHYLPREDNISOLONE ACETATE 40 MG/ML IJ SUSP
40.0000 mg | Freq: Once | INTRAMUSCULAR | Status: AC
  Administered 2018-04-06: 40 mg via INTRA_ARTICULAR

## 2018-04-06 NOTE — Assessment & Plan Note (Signed)
-  Point-of-care ultrasound was performed demonstrating a 1.5 cm x 1 cm Baker's cyst in the posterior medial aspect of the knee.  This is amenable to drainage however given that he has a moderate sized effusion in the knee, he is electing for aspiration of the knee with subsequent injection first.  If he does present with solely posterior medial knee pain, it is reasonable to drain the Baker's cyst however there is a high recurrence rate with this.

## 2018-04-06 NOTE — Assessment & Plan Note (Signed)
-  Unable to review x-rays of his knees -Recommend ordering weightbearing 4 view x-rays of his left knees however he would like to delay this until next visit - INJECTION: Patient was given informed consent, signed copy in the chart. Appropriate time out was taken. Area prepped and draped in usual sterile fashion.  Using 3 cc of 1% lidocaine without epinephrine, a wheal was formed in the superolateral aspect of the knee for anesthesia.  Subsequently, an 18-gauge needle was placed into the suprapatellar pouch with 30 cc of straw-colored joint fluid aspirated from the knee.  Afterwards, 2 cc of methylprednisolone 40 mg/ml plus  4 cc of 1% lidocaine without epinephrine was injected into the suprapellar pouch of the left knee using a(n) supralateral approach. The patient tolerated the procedure well. There were no complications. Post procedure instructions were given.  -He was given a hinged knee brace for assisted ambulation.  He may continue using his diclofenac oral medications.  Follow-up tentatively in 6 weeks.  We did discuss other therapies for his knees including activity modifications, consideration of hyaluronic acid injections, and or repeat steroid injection.  I would consider repeating a steroid injection in 3 months if he does achieve an adequate response.

## 2018-04-06 NOTE — Progress Notes (Addendum)
[name] - 68 y.o. male MRN [id number]  Date of birth: 1950/10/06   Chief complaint: Left knee pain  SUBJECTIVE:    History of present illness: 68 year old African-American male who presents today with a chief complaint of left diffuse knee pain.  His symptoms have been present for minimum 2 months now.  Denies any injury that started the event.  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  Pain is primarily localized to the medial aspect of his knee as well as posteriorly.  He has had his knee aspirated on 2 separate occasions by his primary care physician.  He also had a subsequent cortisone injection approximately 70 months ago for this.  He states he did not get any significant relief from the cortisone injection.  He is here today with ongoing knee pain and swelling.  Pain is described as dull and nonradiating.  It is rated a 5 out of 10 in nature.  No locking popping or giving out of his knee.  He states he is interested in a knee brace.  Denies any hip or groin pain.  No ankle pain.  Denies any rashes or history of gout.  He has been taking Voltaren orally which has been helping significantly with his symptoms.   Review of systems:  As stated above  Past medical history: Hypertension, migraines, history of CVA, COPD, gastroesophageal reflux disease, coronary artery disease Past surgical history: Pacemaker, vasectomy, appendectomy Past family history: HTN, Sickle cell anemia, cancer - unspecified Social history: Current smoker 1/3 pack/day, works at the funeral home in [place]  Medications: Diclofenac, amlodipine, Lipitor, carvedilol, hydrochlorothiazide, albuterol, Zofran, Allegra Allergies: ACE inhibitors  OBJECTIVE:  Physical exam: Vital signs are reviewed. BP (!) 132/94   Ht 6\' 1"  (1.854 m)   Wt 167 lb (75.8 kg)   BMI 22.03 kg/m   Gen.: Alert, oriented, appears stated age, in no  apparent distress HEENT: Moist oral mucosa Respiratory: Normal respirations, able to speak in full sentences Cardiac: Regular rate, distal pulses 2+ Integumentary: No rashes or skin lesions.  Visible edema of his left knee in the suprapatellar pouch. Neurologic:  Sensation is intact to light touch L4-S1 on the left Gait: normal without associated limp Musculoskeletal: Inspection of his left knee demonstrates obvious swelling in the suprapatellar pouch.  He has a moderate palpable effusion. Moderate patellofemoral crepitus with range of motion. He has tenderness to palpation the medial joint line as well as posteriorly in the knee.  He also has a palpable Baker's cyst.  He has decreased knee flexion to 130 degrees secondary to swelling of the knee.  He has full extension.  Strength testing 5 out of 5 in knee flexion and extension.  Negative Lockman test.  Negative anterior and posterior drawer.  Negative McMurray's testing.  Knee is stable to varus valgus stress.  He is neurovascularly intact.  Diagnostics: Point-of-care ultrasound was performed today demonstrating a moderate sized effusion in the suprapatellar pouch of the knee.  There is also a 1.5 cm x 1 cm Baker's cyst in the posterior medial aspect of the knee.  This is communicating with the joint.    ASSESSMENT & PLAN: Primary osteoarthritis of left knee -Unable to review x-rays of his knees -Recommend ordering weightbearing 4 view x-rays of his left knees however he would like to delay this until next visit - INJECTION:  Patient was given informed consent, signed copy in the chart. Appropriate time out was taken. Area prepped and draped in usual sterile fashion.  Using 3 cc of 1% lidocaine without epinephrine, a wheal was formed in the superolateral aspect of the knee for anesthesia.  Subsequently, an 18-gauge needle was placed into the suprapatellar pouch with 30 cc of straw-colored joint fluid aspirated from the knee.  Afterwards, 2 cc of  methylprednisolone 40 mg/ml plus  4 cc of 1% lidocaine without epinephrine was injected into the suprapellar pouch of the left knee using a(n) supralateral approach. The patient tolerated the procedure well. There were no complications. Post procedure instructions were given.  -He was given a hinged knee brace for assisted ambulation.  He may continue using his diclofenac oral medications.  Follow-up tentatively in 6 weeks.  We did discuss other therapies for his knees including activity modifications, consideration of hyaluronic acid injections, and or repeat steroid injection.  I would consider repeating a steroid injection in 3 months if he does achieve an adequate response.  Baker's cyst of knee, left -Point-of-care ultrasound was performed demonstrating a 1.5 cm x 1 cm Baker's cyst in the posterior medial aspect of the knee.  This is amenable to drainage however given that he has a moderate sized effusion in the knee, he is electing for aspiration of the knee with subsequent injection first.  If he does present with solely posterior medial knee pain, it is reasonable to drain the Baker's cyst however there is a high recurrence rate with this.   Meds ordered this encounter  Medications  . methylPREDNISolone acetate (DEPO-MEDROL) injection 40 mg  . methylPREDNISolone acetate (DEPO-MEDROL) injection 40 mg      Clydene Laming, DO Sports Medicine [facility] [place]  I was the preceptor for this visit and available for immediate consultation Shellia Cleverly, DO

## 2018-04-07 ENCOUNTER — Ambulatory Visit (INDEPENDENT_AMBULATORY_CARE_PROVIDER_SITE_OTHER): Payer: 59

## 2018-04-07 DIAGNOSIS — I495 Sick sinus syndrome: Secondary | ICD-10-CM

## 2018-04-13 LAB — CUP PACEART REMOTE DEVICE CHECK
Battery Remaining Longevity: 66 mo
Brady Statistic AP VP Percent: 0 %
Brady Statistic AP VS Percent: 37 %
Brady Statistic AS VP Percent: 0 %
Brady Statistic AS VS Percent: 63 %
Date Time Interrogation Session: 20200203144252
Implantable Lead Implant Date: 20101105
Implantable Lead Location: 753859
Implantable Lead Location: 753860
Implantable Lead Model: 5076
Implantable Pulse Generator Implant Date: 20101105
Lead Channel Impedance Value: 453 Ohm
Lead Channel Pacing Threshold Amplitude: 0.75 V
Lead Channel Pacing Threshold Amplitude: 0.75 V
Lead Channel Pacing Threshold Pulse Width: 0.4 ms
Lead Channel Pacing Threshold Pulse Width: 0.4 ms
Lead Channel Setting Pacing Amplitude: 2 V
Lead Channel Setting Pacing Amplitude: 2.5 V
Lead Channel Setting Sensing Sensitivity: 4 mV
MDC IDC LEAD IMPLANT DT: 20101105
MDC IDC MSMT BATTERY IMPEDANCE: 1002 Ohm
MDC IDC MSMT BATTERY VOLTAGE: 2.78 V
MDC IDC MSMT LEADCHNL RA IMPEDANCE VALUE: 492 Ohm
MDC IDC SET LEADCHNL RV PACING PULSEWIDTH: 0.4 ms

## 2018-04-14 NOTE — Progress Notes (Signed)
Remote pacemaker transmission.   

## 2018-04-18 ENCOUNTER — Encounter: Payer: Self-pay | Admitting: Cardiology

## 2018-04-28 ENCOUNTER — Other Ambulatory Visit: Payer: Self-pay

## 2018-04-28 MED ORDER — DICLOFENAC POTASSIUM 25 MG PO CAPS: 1.0000 | ORAL_CAPSULE | Freq: Two times a day (BID) | ORAL | 0 refills | Status: DC | PRN

## 2018-04-28 NOTE — Telephone Encounter (Signed)
Called pt to let him know of refill - no answer; left this message.

## 2018-04-28 NOTE — Telephone Encounter (Signed)
Requesting a refill on pain med. Please call pt back.

## 2018-04-28 NOTE — Telephone Encounter (Signed)
Called pt - stated he continues c/o knee pain. Requesting refill on Zipsro 25 mg. Also requesting to schedule an appt w/PCp - call transferred to front office.

## 2018-05-18 ENCOUNTER — Ambulatory Visit: Payer: 59 | Admitting: Sports Medicine

## 2018-05-26 ENCOUNTER — Telehealth: Payer: Self-pay | Admitting: Internal Medicine

## 2018-05-26 DIAGNOSIS — E7849 Other hyperlipidemia: Secondary | ICD-10-CM

## 2018-05-26 MED ORDER — CARVEDILOL 3.125 MG PO TABS: 3.1250 mg | ORAL_TABLET | Freq: Two times a day (BID) | ORAL | 3 refills | Status: DC

## 2018-05-26 MED ORDER — HYDROCHLOROTHIAZIDE 25 MG PO TABS: 25.0000 mg | ORAL_TABLET | Freq: Every day | ORAL | 3 refills | Status: DC

## 2018-05-26 MED ORDER — ATORVASTATIN CALCIUM 80 MG PO TABS: 80.0000 mg | ORAL_TABLET | Freq: Every day | ORAL | 3 refills | Status: DC

## 2018-05-26 MED ORDER — AMLODIPINE BESYLATE 10 MG PO TABS: 10.0000 mg | ORAL_TABLET | Freq: Every day | ORAL | 3 refills | Status: DC

## 2018-05-26 MED ORDER — ALBUTEROL SULFATE HFA 108 (90 BASE) MCG/ACT IN AERS: 2.0000 | INHALATION_SPRAY | Freq: Four times a day (QID) | RESPIRATORY_TRACT | 5 refills | Status: DC | PRN

## 2018-05-26 NOTE — Telephone Encounter (Signed)
Spoke with Winferd Humphrey, he has no complaints overall doing well, knee much better after steroid injection.   Discussed we can delay his follow up appointment scheduled for next week.  I have sent in refills of his chronic medications.

## 2018-06-02 ENCOUNTER — Encounter: Payer: 59 | Admitting: Internal Medicine

## 2018-06-06 ENCOUNTER — Other Ambulatory Visit: Payer: Self-pay

## 2018-06-06 ENCOUNTER — Ambulatory Visit (INDEPENDENT_AMBULATORY_CARE_PROVIDER_SITE_OTHER): Payer: 59 | Admitting: Internal Medicine

## 2018-06-06 DIAGNOSIS — R350 Frequency of micturition: Secondary | ICD-10-CM

## 2018-06-06 DIAGNOSIS — I700 Atherosclerosis of aorta: Secondary | ICD-10-CM | POA: Diagnosis not present

## 2018-06-06 DIAGNOSIS — J449 Chronic obstructive pulmonary disease, unspecified: Secondary | ICD-10-CM

## 2018-06-06 DIAGNOSIS — I10 Essential (primary) hypertension: Secondary | ICD-10-CM

## 2018-06-06 DIAGNOSIS — Z720 Tobacco use: Secondary | ICD-10-CM

## 2018-06-06 MED ORDER — VARENICLINE TARTRATE 1 MG PO TABS: 1.0000 mg | ORAL_TABLET | Freq: Two times a day (BID) | ORAL | 1 refills | Status: DC

## 2018-06-06 MED ORDER — TAMSULOSIN HCL 0.4 MG PO CAPS: 0.4000 mg | ORAL_CAPSULE | Freq: Every day | ORAL | 0 refills | Status: DC

## 2018-06-06 MED ORDER — VARENICLINE TARTRATE 0.5 MG X 11 & 1 MG X 42 PO MISC: ORAL | 0 refills | Status: DC

## 2018-06-06 NOTE — Assessment & Plan Note (Addendum)
Stable  continue atorvastatin 

## 2018-06-06 NOTE — Progress Notes (Signed)
Subjective:  HPI: [name] is a 68 y.o. male who presents for urinary frequency.  He reports he is getting up 4-5 times a night urinating.  This issues has been chronic over the last few months.  Also has dribbling and incomplete urination.  These symptoms have been very bothersome.  He thinks it is his prostate, he has seen a supplement on TV that he wants to try but wanted my opinion.  Please see Assessment and Plan below for the status of his chronic medical problems.  Review of Systems: Review of Systems  Constitutional: Negative for fever.  Respiratory: Negative for cough.   Cardiovascular: Negative for chest pain.  Gastrointestinal: Negative for abdominal pain.  Genitourinary: Positive for frequency. Negative for dysuria.  Neurological: Negative for dizziness.  Psychiatric/Behavioral: Negative for depression.    Objective:  Physical Exam: There were no vitals filed for this visit. There is no height or weight on file to calculate BMI. Physical Exam Psychiatric:        Mood and Affect: Mood normal.        Behavior: Behavior normal.        Thought Content: Thought content normal.    Assessment & Plan:  Urinary frequency No family history of prostate cancer.  Due to telehealth visit will have to defer DRE. Craig [name] asked me about prostate cancer screening. He has not had prostate cancer screening before. Risk factors : he does not know have a first degree relative has been dx with prostate cancer before the age of 81 yrs or died of prostate cancer before he age of 39 yrs; he is African Optometrist. We discussed that the USPTF discourages prostate cancer screening but that other medical groups contiune to recommend screening. Our discussion included the following :   In an unscreened population, about 5 out of every 1,000 men will die from prostate cancer after 10 years.   At best, PSA screening may help 1 man in 1,000 avoid death from prostate cancer  after at least 10 years.  100 - 120 of every 1,000 men screened receive a false positive test. A positive test causes worry and anxiety and usually leads to a biopsy.   One-third of men who have a biopsy have side effects like pain, fever, infection, bleeding, and urinary problems. 1% will need to be hospitalized.  Not all prostate cancer cause an elevated PSA and not all elevated PSA's are from cancer.  In most cases, prostate cancer grows slowly and men die with rather than from prostate cancer. We have no way to distinguish benign prostate cancers from aggressive prostate cancers.   90% of men found to have prostate cancer elect to have treatment.   For every 1,000 men who are screened with the PSA test:  30 to 1 men will develop erectile dysfunction or urinary incontinence due to treatment   2 men will experience a serious cardiovascular event, such as a heart attack, due to treatment   1 man will develop a serious blood clot in his leg or lungs due to treatment   For every 3,000 men who are screened with the PSA test:   1 man will die due to complications from surgical treatment    [name] decision on prostate cancer screening is : requested  Ordered PSA. Ordered Urine culture.  Will go ahead and start tiral of flomax 0.4mg  daily for probable BPH.  Essential hypertension I received a fax from a genetics  company indicating that he wanted genetic testing.  I discussed this with [name] he reports that someone called him earlier in the morning he was pretty confused about what was being requested but he always wants more information about his health.  I discussed with him that I do not think this would change my management of him and I think that this would be unnecessary testing.  He was in agreement and we will not pursue this genetic testing.  He reports no issues with his blood pressure does not have a blood pressure cuff at home We will continue his current  medications.  Abdominal aortic atherosclerosis ([facility]) Stable continue atorvastatin  COPD with asthma ([place]) Denies any shortness of breath overall feeling well.  He does desire to quit smoking.  He is currently smoking 4 to 5 cigarettes a day he reports that the nicotine replacement patches did not work well.  He has not tried Chantix but willing to give it a try.  Assessment COPD with asthma, tobacco abuse  Plan Smoking cessation discussed today start discussed we will start 28-month course of Chantix to help with smoking cessation.  Tobacco abuse I spent 4 minutes dedicated to tobacco cessation discussing   Medications Ordered Meds ordered this encounter  Medications  . varenicline (CHANTIX CONTINUING MONTH PAK) 1 MG tablet    Sig: Take 1 tablet (1 mg total) by mouth 2 (two) times daily.    Dispense:  60 tablet    Refill:  1  . varenicline (CHANTIX STARTING MONTH PAK) 0.5 MG X 11 & 1 MG X 42 tablet    Sig: Take 0.5 mg tablet daily for 3 days, then increase to 0.5 mg tablet twice daily for 4 days, then increase to 1 mg tablet twice daily.    Dispense:  53 tablet    Refill:  0  . tamsulosin (FLOMAX) 0.4 MG CAPS capsule    Sig: Take 1 capsule (0.4 mg total) by mouth daily.    Dispense:  90 capsule    Refill:  0   Other Orders Orders Placed This Encounter  Procedures  . PSA    Standing Status:   Future    Standing Expiration Date:   06/06/2019  . BMP8+Anion Gap    Standing Status:   Future    Standing Expiration Date:   06/06/2019  . Urinalysis, Reflex Microscopic    Standing Status:   Future    Standing Expiration Date:   06/06/2019   Follow Up: No follow-ups on file.  This is a telephone encounter between [name] and [name] on 06/06/2018 for urinary frequency. The visit was conducted with the patient located at home and [name] at [facility]. The patient's identity was confirmed using their DOB and current address. The patient has consented to being  evaluated through a telephone encounter and understands the associated risks (an examination cannot be done and the patient may need to come in for an appointment) / benefits (allows the patient to remain at home, decreasing exposure to coronavirus). I personally spent 12 minutes on medical discussion.

## 2018-06-06 NOTE — Assessment & Plan Note (Signed)
Denies any shortness of breath overall feeling well.  He does desire to quit smoking.  He is currently smoking 4 to 5 cigarettes a day he reports that the nicotine replacement patches did not work well.  He has not tried Chantix but willing to give it a try.  Assessment COPD with asthma, tobacco abuse  Plan Smoking cessation discussed today start discussed we will start 47-month course of Chantix to help with smoking cessation.

## 2018-06-06 NOTE — Assessment & Plan Note (Signed)
No family history of prostate cancer.  Due to telehealth visit will have to defer DRE. Mr [name] asked me about prostate cancer screening. He has not had prostate cancer screening before. Risk factors : he does not know have a first degree relative has been dx with prostate cancer before the age of 68 yrs or died of prostate cancer before he age of 60 yrs; he is African Optometrist. We discussed that the USPTF discourages prostate cancer screening but that other medical groups contiune to recommend screening. Our discussion included the following :   In an unscreened population, about 5 out of every 1,000 men will die from prostate cancer after 10 years.   At best, PSA screening may help 1 man in 1,000 avoid death from prostate cancer after at least 10 years.  100 - 120 of every 1,000 men screened receive a false positive test. A positive test causes worry and anxiety and usually leads to a biopsy.   One-third of men who have a biopsy have side effects like pain, fever, infection, bleeding, and urinary problems. 1% will need to be hospitalized.  Not all prostate cancer cause an elevated PSA and not all elevated PSA's are from cancer.  In most cases, prostate cancer grows slowly and men die with rather than from prostate cancer. We have no way to distinguish benign prostate cancers from aggressive prostate cancers.   90% of men found to have prostate cancer elect to have treatment.   For every 1,000 men who are screened with the PSA test:  30 to 70 men will develop erectile dysfunction or urinary incontinence due to treatment   2 men will experience a serious cardiovascular event, such as a heart attack, due to treatment   1 man will develop a serious blood clot in his leg or lungs due to treatment   For every 3,000 men who are screened with the PSA test:   1 man will die due to complications from surgical treatment    Mr [name] decision on prostate cancer screening is :  requested  Ordered PSA. Ordered Urine culture.  Will go ahead and start tiral of flomax 0.4mg  daily for probable BPH.

## 2018-06-06 NOTE — Assessment & Plan Note (Signed)
I received a fax from a [name] indicating that he wanted genetic testing.  I discussed this with [name] he reports that someone called him earlier in the morning he was pretty confused about what was being requested but he always wants more information about his health.  I discussed with him that I do not think this would change my management of him and I think that this would be unnecessary testing.  He was in agreement and we will not pursue this genetic testing.  He reports no issues with his blood pressure does not have a blood pressure cuff at home We will continue his current medications.

## 2018-06-06 NOTE — Assessment & Plan Note (Signed)
I spent 4 minutes dedicated to tobacco cessation discussing

## 2018-07-07 ENCOUNTER — Other Ambulatory Visit: Payer: Self-pay

## 2018-07-07 ENCOUNTER — Encounter: Payer: 59 | Admitting: *Deleted

## 2018-07-20 ENCOUNTER — Other Ambulatory Visit: Payer: Self-pay | Admitting: *Deleted

## 2018-07-20 ENCOUNTER — Telehealth: Payer: Self-pay

## 2018-07-20 NOTE — Telephone Encounter (Signed)
Sent request in new encounter 

## 2018-07-20 NOTE — Telephone Encounter (Signed)
Requesting to speak with a nurse about meds. Please call pt back.  

## 2018-07-21 ENCOUNTER — Encounter: Payer: Self-pay | Admitting: Sports Medicine

## 2018-07-21 ENCOUNTER — Ambulatory Visit: Payer: Self-pay

## 2018-07-21 ENCOUNTER — Ambulatory Visit (INDEPENDENT_AMBULATORY_CARE_PROVIDER_SITE_OTHER): Payer: 59 | Admitting: Sports Medicine

## 2018-07-21 ENCOUNTER — Other Ambulatory Visit: Payer: Self-pay

## 2018-07-21 VITALS — BP 136/92 | Ht 73.0 in | Wt 167.0 lb

## 2018-07-21 DIAGNOSIS — M1712 Unilateral primary osteoarthritis, left knee: Secondary | ICD-10-CM | POA: Diagnosis not present

## 2018-07-21 DIAGNOSIS — M25562 Pain in left knee: Secondary | ICD-10-CM | POA: Diagnosis not present

## 2018-07-21 MED ORDER — METHYLPREDNISOLONE ACETATE 80 MG/ML IJ SUSP
80.0000 mg | Freq: Once | INTRAMUSCULAR | Status: AC
  Administered 2018-07-21: 15:00:00 80 mg via INTRAMUSCULAR

## 2018-07-21 MED ORDER — TAMSULOSIN HCL 0.4 MG PO CAPS: 0.4000 mg | ORAL_CAPSULE | Freq: Every day | ORAL | 0 refills | Status: DC

## 2018-07-21 MED ORDER — VARENICLINE TARTRATE 1 MG PO TABS: 1.0000 mg | ORAL_TABLET | Freq: Two times a day (BID) | ORAL | 1 refills | Status: DC

## 2018-07-21 NOTE — Progress Notes (Signed)
[name] - 68 y.o. male MRN [id number]  Date of birth: [birth year]   Chief complaint: L knee pain  SUBJECTIVE:    History of present illness: 68yo male who presents today for follow up of L knee pain. He has known osteoarthritis of his left knee and was last seen back in January 2020 for this same issue. That visit, the knee was aspirated and injected with cortisone. He reports 3-4 months of relief with this.  He is here today requesting a repeat injection.  He states his pain is dull however it has increased to a 2 out of 10 at rest and 6 out of 10 with activity.  He states he is also having significant swelling and mild decrease in flexion of his knee secondary to the swelling.  He is very active with ambulation and would like to continue doing this.  He does intermittently take anti-inflammatories which helps. Denies any new symptoms of instability.  No locking, popping or giving way of his knee.  No hip or groin pain.  No ankle pain associated with this problem.  Review of systems:  Per HPI; in addition no fever, no rash, no additional weakness, no additional numbness, no additional paresthesias, and no additional fall/injury   Interval past medical history, surgical history, family history, and social history obtained and are unchanged.   Of note, he is a smoker.  Medications reviewed.  Of note he takes diclofenac as an anti-inflammatory twice daily. Allergies reviewed.  Of note he has an allergy to ACE inhibitors  OBJECTIVE:  Physical exam: Vital signs are reviewed. BP (!) 136/92   Ht 6\' 1"  (1.854 m)   Wt 167 lb (75.8 kg)   BMI 22.03 kg/m   Gen.: Alert, oriented, appears stated age, in no apparent distress Integumentary: No rashes, erythema or ecchymoses Neurologic:  Sensation intact light touch L4-S1 on the left Gait: normal without associated limp Musculoskeletal:  Knee, L: Inspection was negative for erythema, Positive mild effusion, and negative for obvious bony  abnormalities. Palpation was negative for obvious Baker's cyst development, negative for asymmetric warmth, Positive for medial joint line tenderness, negative for patellar tenderness, positive for mild patellar crepitus, and negative for tenderness of the pes anserine bursa. Patellar and quadriceps tendons unremarkable. ROM abnormal in flexion (110 degrees) and extension (0 degrees) and lower leg rotation. Normal hamstring and quadriceps strength. Neurovascularly intact bilaterally.  - Ligaments: (Solid and consistent endpoints)   - ACL (present bilaterally)   - PCL (present bilaterally)   - LCL (present bilaterally)   - MCL (present bilaterally).   - Additional tests performed:    - Anterior Drawer >> negative   - Lachman >> negative  - Meniscus:   - McMurray's: negative   - Patella:   - Patellar grind/compression: negative.    ASSESSMENT & PLAN: Primary osteoarthritis of left knee Consent obtained and verified. Time-out conducted. Noted no overlying erythema, induration, or other signs of local infection. Skin prepped in a sterile fashion. Topical analgesic spray: Ethyl chloride. Joint: L intraarticular knee via superolateral approach Needle: 25g 1.5 inch needle with 3 cc of 1% xylocaine used to anesthetize a tract. 18g needle inserted to aspirate fluid under ultrasound guidance. Soft tissue swelling inferior to quadriceps seen with small effusion. 3cc of straw colored fluid was aspirated. Meds: 3cc 1% xylocaine, 1cc (80mg ) depomedrol was inserted afterwards.   Advised to call if fevers/chills, erythema, induration, drainage, or persistent bleeding.  Encouraged getting 4v X-rays of L knee however  patient will hold off to see if injections are not working. Continue NSAIDs, bracing, and activity as tolerated. Follow up in 3 months or sooner if bothersome.   Orders Placed This Encounter  Procedures  . [place] LIMITED JOINT SPACE STRUCTURES LOW LEFT    Standing Status:   Future     Number of Occurrences:   1    Standing Expiration Date:   09/19/2019    Order Specific Question:   Reason for Exam (SYMPTOM  OR DIAGNOSIS REQUIRED)    Answer:   knee pain    Order Specific Question:   Preferred imaging location?    Answer:   Internal      Clydene Laming, [place]

## 2018-07-21 NOTE — Assessment & Plan Note (Signed)
Consent obtained and verified. Time-out conducted. Noted no overlying erythema, induration, or other signs of local infection. Skin prepped in a sterile fashion. Topical analgesic spray: Ethyl chloride. Joint: L intraarticular knee via superolateral approach Needle: 25g 1.5 inch needle with 3 cc of 1% xylocaine used to anesthetize a tract. 18g needle inserted to aspirate fluid under ultrasound guidance. Soft tissue swelling inferior to quadriceps seen with small effusion. 3cc of straw colored fluid was aspirated. Meds: 3cc 1% xylocaine, 1cc (80mg ) depomedrol was inserted afterwards.   Advised to call if fevers/chills, erythema, induration, drainage, or persistent bleeding.

## 2018-08-04 ENCOUNTER — Ambulatory Visit (INDEPENDENT_AMBULATORY_CARE_PROVIDER_SITE_OTHER): Payer: 59 | Admitting: *Deleted

## 2018-08-04 DIAGNOSIS — I495 Sick sinus syndrome: Secondary | ICD-10-CM | POA: Diagnosis not present

## 2018-08-04 LAB — CUP PACEART REMOTE DEVICE CHECK
Battery Impedance: 1104 Ohm
Battery Remaining Longevity: 62 mo
Battery Voltage: 2.78 V
Brady Statistic AP VP Percent: 0 %
Brady Statistic AP VS Percent: 36 %
Brady Statistic AS VP Percent: 0 %
Brady Statistic AS VS Percent: 63 %
Date Time Interrogation Session: 20200528024533
Implantable Lead Implant Date: 20101105
Implantable Lead Implant Date: 20101105
Implantable Lead Location: 753859
Implantable Lead Location: 753860
Implantable Lead Model: 5076
Implantable Lead Model: 5076
Implantable Pulse Generator Implant Date: 20101105
Lead Channel Impedance Value: 490 Ohm
Lead Channel Impedance Value: 507 Ohm
Lead Channel Pacing Threshold Amplitude: 0.625 V
Lead Channel Pacing Threshold Amplitude: 0.625 V
Lead Channel Pacing Threshold Pulse Width: 0.4 ms
Lead Channel Pacing Threshold Pulse Width: 0.4 ms
Lead Channel Sensing Intrinsic Amplitude: 2.8 mV
Lead Channel Sensing Intrinsic Amplitude: 8 mV
Lead Channel Setting Pacing Amplitude: 2 V
Lead Channel Setting Pacing Amplitude: 2.5 V
Lead Channel Setting Pacing Pulse Width: 0.4 ms
Lead Channel Setting Sensing Sensitivity: 4 mV

## 2018-08-10 ENCOUNTER — Other Ambulatory Visit: Payer: Self-pay

## 2018-08-10 ENCOUNTER — Observation Stay (HOSPITAL_COMMUNITY)
Admission: EM | Admit: 2018-08-10 | Discharge: 2018-08-11 | Disposition: A | Discharge status: Home / Self Care | Payer: 59 | Attending: Internal Medicine | Admitting: Internal Medicine

## 2018-08-10 ENCOUNTER — Emergency Department (HOSPITAL_COMMUNITY): Payer: 59

## 2018-08-10 ENCOUNTER — Ambulatory Visit: Payer: 59

## 2018-08-10 ENCOUNTER — Ambulatory Visit (INDEPENDENT_AMBULATORY_CARE_PROVIDER_SITE_OTHER): Payer: 59 | Admitting: Internal Medicine

## 2018-08-10 ENCOUNTER — Encounter: Payer: Self-pay | Admitting: Internal Medicine

## 2018-08-10 DIAGNOSIS — F17210 Nicotine dependence, cigarettes, uncomplicated: Secondary | ICD-10-CM | POA: Diagnosis not present

## 2018-08-10 DIAGNOSIS — Z950 Presence of cardiac pacemaker: Secondary | ICD-10-CM | POA: Diagnosis not present

## 2018-08-10 DIAGNOSIS — J449 Chronic obstructive pulmonary disease, unspecified: Secondary | ICD-10-CM | POA: Diagnosis not present

## 2018-08-10 DIAGNOSIS — R202 Paresthesia of skin: Principal | ICD-10-CM | POA: Insufficient documentation

## 2018-08-10 DIAGNOSIS — R200 Anesthesia of skin: Secondary | ICD-10-CM | POA: Diagnosis present

## 2018-08-10 DIAGNOSIS — I10 Essential (primary) hypertension: Secondary | ICD-10-CM | POA: Insufficient documentation

## 2018-08-10 DIAGNOSIS — Z7982 Long term (current) use of aspirin: Secondary | ICD-10-CM | POA: Insufficient documentation

## 2018-08-10 DIAGNOSIS — Z79899 Other long term (current) drug therapy: Secondary | ICD-10-CM | POA: Insufficient documentation

## 2018-08-10 DIAGNOSIS — Z8673 Personal history of transient ischemic attack (TIA), and cerebral infarction without residual deficits: Secondary | ICD-10-CM | POA: Insufficient documentation

## 2018-08-10 DIAGNOSIS — R531 Weakness: Secondary | ICD-10-CM | POA: Insufficient documentation

## 2018-08-10 DIAGNOSIS — D573 Sickle-cell trait: Secondary | ICD-10-CM | POA: Diagnosis not present

## 2018-08-10 DIAGNOSIS — Z20828 Contact with and (suspected) exposure to other viral communicable diseases: Secondary | ICD-10-CM | POA: Diagnosis not present

## 2018-08-10 LAB — DIFFERENTIAL
Abs Immature Granulocytes: 0.01 10*3/uL (ref 0.00–0.07)
Basophils Absolute: 0 10*3/uL (ref 0.0–0.1)
Basophils Relative: 0 %
Eosinophils Absolute: 0.1 10*3/uL (ref 0.0–0.5)
Eosinophils Relative: 1 %
Immature Granulocytes: 0 %
Lymphocytes Relative: 29 %
Lymphs Abs: 1.6 10*3/uL (ref 0.7–4.0)
Monocytes Absolute: 0.5 10*3/uL (ref 0.1–1.0)
Monocytes Relative: 10 %
Neutro Abs: 3.2 10*3/uL (ref 1.7–7.7)
Neutrophils Relative %: 60 %

## 2018-08-10 LAB — I-STAT CHEM 8, ED
BUN: 23 mg/dL (ref 8–23)
Calcium, Ion: 1.11 mmol/L — ABNORMAL LOW (ref 1.15–1.40)
Chloride: 100 mmol/L (ref 98–111)
Creatinine, Ser: 1 mg/dL (ref 0.61–1.24)
Glucose, Bld: 92 mg/dL (ref 70–99)
HCT: 45 % (ref 39.0–52.0)
Hemoglobin: 15.3 g/dL (ref 13.0–17.0)
Potassium: 3.8 mmol/L (ref 3.5–5.1)
Sodium: 136 mmol/L (ref 135–145)
TCO2: 27 mmol/L (ref 22–32)

## 2018-08-10 LAB — COMPREHENSIVE METABOLIC PANEL
ALT: 49 U/L — ABNORMAL HIGH (ref 0–44)
AST: 41 U/L (ref 15–41)
Albumin: 4.3 g/dL (ref 3.5–5.0)
Alkaline Phosphatase: 135 U/L — ABNORMAL HIGH (ref 38–126)
Anion gap: 10 (ref 5–15)
BUN: 19 mg/dL (ref 8–23)
CO2: 26 mmol/L (ref 22–32)
Calcium: 9.6 mg/dL (ref 8.9–10.3)
Chloride: 101 mmol/L (ref 98–111)
Creatinine, Ser: 1.01 mg/dL (ref 0.61–1.24)
GFR calc Af Amer: >60 mL/min (ref >60)
GFR calc non Af Amer: >60 mL/min (ref >60)
Glucose, Bld: 90 mg/dL (ref 70–99)
Potassium: 3.8 mmol/L (ref 3.5–5.1)
Sodium: 137 mmol/L (ref 135–145)
Total Bilirubin: 1.2 mg/dL (ref 0.3–1.2)
Total Protein: 7.5 g/dL (ref 6.5–8.1)

## 2018-08-10 LAB — CBC
HCT: 44.5 % (ref 39.0–52.0)
Hemoglobin: 15.3 g/dL (ref 13.0–17.0)
MCH: 29.2 pg (ref 26.0–34.0)
MCHC: 34.4 g/dL (ref 30.0–36.0)
MCV: 84.9 fL (ref 80.0–100.0)
Platelets: 239 10*3/uL (ref 150–400)
RBC: 5.24 MIL/uL (ref 4.22–5.81)
RDW: 14.8 % (ref 11.5–15.5)
WBC: 5.4 10*3/uL (ref 4.0–10.5)
nRBC: 0 % (ref 0.0–0.2)

## 2018-08-10 LAB — CBG MONITORING, ED: Glucose-Capillary: 84 mg/dL (ref 70–99)

## 2018-08-10 LAB — PROTIME-INR
INR: 1.1 (ref 0.8–1.2)
Prothrombin Time: 13.9 seconds (ref 11.4–15.2)

## 2018-08-10 LAB — APTT: aPTT: 32 seconds (ref 24–36)

## 2018-08-10 LAB — SARS CORONAVIRUS 2 BY RT PCR (HOSPITAL ORDER, PERFORMED IN ~~LOC~~ HOSPITAL LAB): SARS Coronavirus 2: NEGATIVE

## 2018-08-10 MED ORDER — ASPIRIN EC 81 MG PO TBEC
81.0000 mg | DELAYED_RELEASE_TABLET | Freq: Every day | ORAL | Status: DC
  Administered 2018-08-10: 81 mg via ORAL
  Filled 2018-08-10: qty 1

## 2018-08-10 MED ORDER — ATORVASTATIN CALCIUM 80 MG PO TABS
80.0000 mg | ORAL_TABLET | Freq: Every day | ORAL | Status: DC
  Administered 2018-08-10: 80 mg via ORAL
  Filled 2018-08-10: qty 1

## 2018-08-10 MED ORDER — SODIUM CHLORIDE 0.9% FLUSH
3.0000 mL | Freq: Once | INTRAVENOUS | Status: AC
Start: 2018-08-10 — End: 2018-08-10
  Administered 2018-08-10: 3 mL via INTRAVENOUS

## 2018-08-10 MED ORDER — ACETAMINOPHEN 650 MG RE SUPP: 650.0000 mg | Freq: Four times a day (QID) | RECTAL | Status: DC | PRN

## 2018-08-10 MED ORDER — ENOXAPARIN SODIUM 40 MG/0.4ML ~~LOC~~ SOLN
40.0000 mg | Freq: Every day | SUBCUTANEOUS | Status: DC
  Filled 2018-08-10: qty 0.4

## 2018-08-10 MED ORDER — ONDANSETRON HCL 4 MG/2ML IJ SOLN: 4.0000 mg | Freq: Four times a day (QID) | INTRAMUSCULAR | Status: DC | PRN

## 2018-08-10 MED ORDER — ALBUTEROL SULFATE (2.5 MG/3ML) 0.083% IN NEBU: 2.5000 mg | INHALATION_SOLUTION | Freq: Four times a day (QID) | RESPIRATORY_TRACT | Status: DC | PRN

## 2018-08-10 MED ORDER — ACETAMINOPHEN 325 MG PO TABS: 650.0000 mg | ORAL_TABLET | Freq: Four times a day (QID) | ORAL | Status: DC | PRN

## 2018-08-10 MED ORDER — ONDANSETRON HCL 4 MG PO TABS: 4.0000 mg | ORAL_TABLET | Freq: Four times a day (QID) | ORAL | Status: DC | PRN

## 2018-08-10 MED ORDER — TAMSULOSIN HCL 0.4 MG PO CAPS
0.4000 mg | ORAL_CAPSULE | Freq: Every day | ORAL | Status: DC
  Administered 2018-08-11: 0.4 mg via ORAL
  Filled 2018-08-10: qty 1

## 2018-08-10 NOTE — Progress Notes (Signed)
Curbsided by ER for opinion on neurological work up based on off an on paresthesias in the right hemibody. Recommended getting MRI brain and calling back if positive for stroke. Other options would be to look at MRI C-spine, if brain MRI is unrevealing as well as checking B12, TSH, A1c, RPR  for reversible causes of paresthesias.  Please call back if we can be of help.  -- Amie Portland, MD [facility] Pager: [phone] If 7pm to 7am, please call on call as listed on AM[facility].

## 2018-08-10 NOTE — Assessment & Plan Note (Addendum)
Patient report 3-4 days of intermittent right sided weakness and tingling in his right hand and right foot. This is associated with lightheadedness and generalized fatigue. Episodes typically last 30-60 minutes and are relieved by laying down. He is currently experiencing some sumyptoms incluing tingling of his right hand and foot.  Neuro exam grossly intact. Sicade when tracking from right to left.   Patient has a history of R thalamus stroke in 2010. Take ASA and Atorvastatin. Continues to smoke.  Concern for TIA vs Stoke given symptoms in UE and LE as well as EOM change. Patient transported to ED for possible code stroke and further workup given active symtpoms.

## 2018-08-10 NOTE — ED Triage Notes (Signed)
Pt in c/o right hand and foot numbness and tingling that has been off and on for the last 3-4 days, the current episode of numbness started last night and has not resolved, pt went to internal medicine clinic and they brought him here for stroke r/o

## 2018-08-10 NOTE — Progress Notes (Signed)
   CC: Paresthesias, weakness  HPI:  [name] is a 68 y.o. M with PMHx listed below presenting for Paresthesias, weakness. Please see the A&P for the status of the patient's chronic medical problems.    Past Medical History:  Diagnosis Date  . CEREBROVASCULAR ACCIDENT 10/17/2008   Acute CVA of right thalamus 10/03/08 ECHO performed 2/2 CVA 10/09/08, EF 55-65%  Aspirin 325 mg daily and try to work on quitting smoking.   . Emphysema    per multiple CXRs  . Hypertension    goal < 140/90  . Sickle cell trait ([place])   . Snoring    Sleep study 09/02/10 was WNL  . Stroke [facility]) 10/03/08   Right thalamus  . Tuberculosis at age 55-3   Review of Systems:  Performed and all others negative.  Physical Exam:  Vitals:   08/10/18 1331  BP: 120/90  Pulse: 87  Temp: 98.2 F (36.8 C)  TempSrc: Oral  SpO2: 100%  Weight: 165 lb 3.2 oz (74.9 kg)  Height: 6\' 1"  (1.854 m)   Physical Exam Constitutional:      General: He is not in acute distress.    Appearance: Normal appearance.  Cardiovascular:     Rate and Rhythm: Normal rate and regular rhythm.     Pulses: Normal pulses.     Heart sounds: Normal heart sounds.  Pulmonary:     Effort: Pulmonary effort is normal. No respiratory distress.     Breath sounds: Normal breath sounds.  Abdominal:     General: Bowel sounds are normal. There is no distension.     Palpations: Abdomen is soft.     Tenderness: There is no abdominal tenderness.  Musculoskeletal:        General: No swelling or deformity.  Skin:    General: Skin is warm and dry.  Neurological:     Comments: Mental Status: Patient is awake, alert, oriented x3 No signs of aphasia or neglect Cranial Nerves: II: Pupils equal, round, and reactive to light.  III,IV, VI: Left eye with Saccade to the right while track from right to left before completion of tracking   V: Facial sensation is symmetric tolight touch VII: Facial movement is symmetric.  VIII: hearing is intact  to voice X: Uvula elevates symmetrically XI: Shoulder shrug is symmetric. XII: tongue is midline without atrophy or fasciculations.  Motor: Strength 5/5 bilateral UE, 5/5 bilateral lower extremitiy Sensory: Sensation is grossly intact bilateral UEs & LEs     Assessment & Plan:   See Encounters Tab for problem based charting.  Patient discussed with Dr. Lynnae January

## 2018-08-10 NOTE — H&P (Addendum)
Date: 08/10/2018               Patient Name:  [name] MRN: [id number]  DOB: 1950-05-23 Age / Sex: 68 y.o., male   PCP: [name], DO         Medical Service: Internal Medicine Teaching Service         Attending Physician: Dr. Aldine Contes    First Contact: Dr. Sherry Ruffing Pager: [phone]  Second Contact: Dr. Maricela Bo  Pager: [phone]       After Hours (After 5p/  First Contact Pager: [phone]  weekends / holidays): Second Contact Pager: [phone]   Chief Complaint: Numbness/Tingling in right fingers and toes  History of Present Illness: [name] is a 68 y.o male with HTN, HLD, prior CVA, and sick sinus syndrome s/p pacemaker placement presenting to the ED with numbness in right side. History was obtained from chart review and the patient.   He was in his usual state of health til about 3-4 days ago when he noticed numbness in the tips of his fingers and toes on the right side. He was not doing anything in particular during onset. He said his right side feels cold and this has been constant since it started. He also noticed dizzy spells and weakness since all of this started, primarily with standing. He has never had an issue like this before. Denies any trauma to the shoulder or neck, loss of strength. He is right handed. Denies visual changes, fevers/chills, CP, SHOB, palpitations, difficulty with urination, abdominal pain, N/V, changes in bowel habits. 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 He has a history of CVA in 2010 without residual deficits. Baseline able to preform all ADLs.   Meds:  Current Meds  Medication Sig  . albuterol (PROVENTIL HFA;VENTOLIN HFA) 108 (90 Base) MCG/ACT inhaler Inhale 2 puffs into the lungs every 6 (six) hours as needed for wheezing or shortness of breath.  Marland Kitchen amLODipine (NORVASC) 10 MG tablet Take 1 tablet (10 mg total) by mouth daily.  Marland Kitchen aspirin EC 81 MG tablet Take  81 mg by mouth at bedtime.  Marland Kitchen atorvastatin (LIPITOR) 80 MG tablet Take 1 tablet (80 mg total) by mouth daily. (Patient taking differently: Take 80 mg by mouth at bedtime. )  . carvedilol (COREG) 3.125 MG tablet Take 1 tablet (3.125 mg total) by mouth 2 (two) times daily with a meal.  . hydrochlorothiazide (HYDRODIURIL) 25 MG tablet Take 1 tablet (25 mg total) by mouth daily.  . tamsulosin (FLOMAX) 0.4 MG CAPS capsule Take 1 capsule (0.4 mg total) by mouth daily.  . varenicline (CHANTIX CONTINUING MONTH PAK) 1 MG tablet Take 1 tablet (1 mg total) by mouth 2 (two) times daily. (Patient taking differently: Take 1 mg by mouth daily. )   Allergies: Allergies as of 08/10/2018 - Review Complete 08/10/2018  Allergen Reaction Noted  . Ace inhibitors Swelling and Other (See Comments) 07/26/2013   Past Medical History:  Diagnosis Date  . CEREBROVASCULAR ACCIDENT 10/17/2008   Acute CVA of right thalamus 10/03/08 ECHO performed 2/2 CVA 10/09/08, EF 55-65%  Aspirin 325 mg daily and try to work on quitting smoking.   . Emphysema    per multiple CXRs  . Hypertension    goal < 140/90  . Sickle cell trait ([place])   . Snoring    Sleep study 09/02/10 was WNL  . Stroke [facility]) 10/03/08  Right thalamus  . Tuberculosis at age 12-3   Family History  Problem Relation Age of Onset  . Cancer Sister   . Hypertension Mother   . Sickle cell anemia Son    Social History: Works as a Company secretary and for a Librarian, academic and funeral homes. Very active. Masters degree. Smokes 3-4 cigarettes per day since the age of 69. No EtOH or illicit substance use.   Review of Systems: A complete ROS was negative except as per HPI.   Physical Exam: Blood pressure (!) 140/99, pulse 69, resp. rate 15, SpO2 100 %.  Physical Exam  Constitutional: He is oriented to person, place, and time and well-developed, well-nourished, and in no distress.  HENT:  Head: Normocephalic and atraumatic.  Eyes: Pupils are equal, round, and reactive to light.  Right eye exhibits no discharge. Left eye exhibits no discharge. Right eye exhibits abnormal extraocular motion. Left eye exhibits abnormal extraocular motion.  Arcus senilis, horizontal saccades right eye lag > left  Cardiovascular: Normal rate, regular rhythm, normal heart sounds and intact distal pulses. Exam reveals no gallop and no friction rub.  No murmur heard. Pulmonary/Chest: Effort normal and breath sounds normal. No respiratory distress. He has no wheezes. He has no rales.  Abdominal: Soft. Bowel sounds are normal. He exhibits no distension. There is no abdominal tenderness.  Musculoskeletal:        General: No tenderness or edema.  Neurological: He is alert and oriented to person, place, and time. No cranial nerve deficit.  Decreased sensation at distal finger tips and toes on right side, sharp/dull sensation in tact, intact sensation to temperature   Skin: Skin is warm and dry. He is not diaphoretic.  Psychiatric: Mood, memory, affect and judgment normal.    EKG: personally reviewed my interpretation is NSR  CT Head: Negative head CT   Assessment & Plan by Problem: Active Problems:   Right sided numbness  [name] is a 68 y.o male with HTN, HLD, prior CVA, and sick sinus syndrome s/p pacemaker placement presenting to the ED with 3-4 days of numbness in his distal right finger tips and toes.  Right sided numbness: Presenting with right sided distal neuropathy. Sensation intact to temperature, sharp and dull. No cranial nerve or focal deficit. He does have notable horizontal saccades, with right eye lag greater than left. Discussed with Neuro who states this could be due to underlying HTN, HLD and/or smoking. Recommended repeating CT head 12-24 hours after first (patient has a pacemaker that is not compatible with MRI). NIH stroke scale score 1. Will look for reversible causes. If repeat CT is negative patient will need outpatient EMG studies. - Cardiac monitoring - Repeat  head CT  - Follow up Hgb A1c, TSH, HIV, RPR, vitamin B12 and lipid panel - Allow for permissive HTN until repeat CT   Prior CVA: of right thalamus in 2010 with no residual deficits. Continue aspirin 81 mg   HTN: Home meds include amlodipine 10 mg, carvedilol 3.125 mg bid, hctz 25 mg. Hold to allow for permissive hypertension for possible TIA vs. Stroke  HLD: continue atorvastatin 80 mg   Diet: Heart Healthy VTE ppx: Lovenox DNR  Dispo: Admit patient to Inpatient with expected length of stay greater than 2 midnights.  SignedMike Craze, DO 08/10/2018, 9:56 PM

## 2018-08-10 NOTE — Progress Notes (Signed)
Internal Medicine Clinic Attending  Case discussed with Dr. Melvin  at the time of the visit.  We reviewed the resident's history and exam and pertinent patient test results.  I agree with the assessment, diagnosis, and plan of care documented in the resident's note.  

## 2018-08-10 NOTE — Progress Notes (Signed)
Pt has [employer]tronic Pacemaker that is not safe for MRI. Contacted Suella Broad to inform her that patient cannot have MRI.  Information has been scanned into pt chart under chart review and then the media tab.

## 2018-08-10 NOTE — ED Provider Notes (Addendum)
[facility] EMERGENCY DEPARTMENT Provider Note   CSN: [id number] Arrival date & time: 08/10/18  1424    History   Chief Complaint Chief Complaint  Patient presents with  . Numbness    HPI [name] is a 68 y.o. male.     68yo male with history of CVA right thalamus 10 years ago, states at that time he had tingling in his left fingers and toes. Patient states he went to his PCP today for tingling in his right fingers and toes for the past 3-4 days and was sent to the ER for possible CVA. Patient states these symptoms are intermittent and are not present at this time.  Reports occasional weakness on his right side, however not at this time.  Patient denies any changes in vision, speech, gait.  Patient states that when his fingers feel tingly they are cold, denies any blanching of the fingers or toes, no history of ray nods, states fingers and toes do not experience swelling or redness.  No other complaints or concerns.     Past Medical History:  Diagnosis Date  . CEREBROVASCULAR ACCIDENT 10/17/2008   Acute CVA of right thalamus 10/03/08 ECHO performed 2/2 CVA 10/09/08, EF 55-65%  Aspirin 325 mg daily and try to work on quitting smoking.   . Emphysema    per multiple CXRs  . Hypertension    goal < 140/90  . Sickle cell trait ([place])   . Snoring    Sleep study 09/02/10 was WNL  . Stroke [facility]) 10/03/08   Right thalamus  . Tuberculosis at age 43-3    Patient Active Problem List   Diagnosis Date Noted  . Acute right-sided weakness 08/10/2018  . Primary osteoarthritis of left knee 07/21/2018  . Primary osteoarthritis of right knee 04/06/2018  . Baker's cyst of knee, left 04/06/2018  . TMJ disease 09/17/2017  . Posterior knee pain, left 06/17/2017  . Left leg swelling 06/04/2017  . Tuberculosis   . Snoring   . Sickle cell trait ([place])   . Hypertension   . Penile discharge 10/08/2016  . Abdominal aortic atherosclerosis ([place]) 04/21/2016  . Multiple lung  nodules on CT 09/12/2015  . Urinary frequency 01/04/2015  . Seasonal allergies 06/15/2014  . Possible exposure to STD 06/15/2014  . Nausea 10/03/2013  . Vision changes 05/18/2013  . Renal cyst, right 02/08/2013  . History of tuberculosis 01/19/2013  . Sinoatrial node dysfunction ([facility]) 04/11/2012  . Atrial tachycardia ([place]) 04/11/2012  . Healthcare maintenance 11/26/2011  . Eustachian tube dysfunction 11/26/2011  . Pacemaker 11/25/2011  . COPD with asthma ([place])   . GERD (gastroesophageal reflux disease) 02/04/2011  . Tobacco abuse 07/01/2010  . Back pain 06/24/2010  . Hyperlipidemia 09/30/2009  . MIGRAINE WITHOUT AURA 09/30/2009  . ERECTILE DYSFUNCTION, ORGANIC 01/16/2009  . Essential hypertension 11/22/2008  . CEREBROVASCULAR ACCIDENT 10/17/2008  . Stroke [facility]) 10/03/2008    Past Surgical History:  Procedure Laterality Date  . APPENDECTOMY  1977  . PACEMAKER INSERTION  07/24/08   MDT Adapta L implanted by Dr Blanch Media  . VASECTOMY  1990        Home Medications    Prior to Admission medications   Medication Sig Start Date End Date Taking? Authorizing Provider  albuterol (PROVENTIL HFA;VENTOLIN HFA) 108 (90 Base) MCG/ACT inhaler Inhale 2 puffs into the lungs every 6 (six) hours as needed for wheezing or shortness of breath. 05/26/18  Yes [name], DO  amLODipine (NORVASC) 10 MG tablet  Take 1 tablet (10 mg total) by mouth daily. 05/26/18  Yes [name], DO  aspirin EC 81 MG tablet Take 81 mg by mouth at bedtime.   Yes [provider]  atorvastatin (LIPITOR) 80 MG tablet Take 1 tablet (80 mg total) by mouth daily. Patient taking differently: Take 80 mg by mouth at bedtime.  05/26/18 02/15/20 Yes [name], DO  carvedilol (COREG) 3.125 MG tablet Take 1 tablet (3.125 mg total) by mouth 2 (two) times daily with a meal. 05/26/18  Yes [name], DO  hydrochlorothiazide (HYDRODIURIL) 25 MG tablet Take 1 tablet (25 mg total) by mouth daily. 05/26/18  Yes  [name], DO  tamsulosin (FLOMAX) 0.4 MG CAPS capsule Take 1 capsule (0.4 mg total) by mouth daily. 07/21/18  Yes [name], DO  varenicline (CHANTIX CONTINUING MONTH PAK) 1 MG tablet Take 1 tablet (1 mg total) by mouth 2 (two) times daily. Patient taking differently: Take 1 mg by mouth daily.  07/21/18  Yes [name], DO  Diclofenac Potassium 25 MG CAPS Take 1 capsule (25 mg total) by mouth 2 (two) times daily between meals as needed. Patient not taking: Reported on 08/10/2018 04/28/18   [name], DO  varenicline (CHANTIX STARTING MONTH PAK) 0.5 MG X 11 & 1 MG X 42 tablet Take 0.5 mg tablet daily for 3 days, then increase to 0.5 mg tablet twice daily for 4 days, then increase to 1 mg tablet twice daily. Patient not taking: Reported on 08/10/2018 06/06/18   [name], DO    Family History Family History  Problem Relation Age of Onset  . Cancer Sister   . Hypertension Mother   . Sickle cell anemia Son     Social History Social History   Tobacco Use  . Smoking status: Current Every Day Smoker    Packs/day: 0.30    Years: 10.00    Pack years: 3.00    Types: Cigarettes  . Smokeless tobacco: Never Used  . Tobacco comment: Cutting back. DOWN TO 2-3 CIAGARETTES  Substance Use Topics  . Alcohol use: No    Alcohol/week: 0.0 standard drinks  . Drug use: No    Comment: previous polysubstance abuser, quit x 13 years     Allergies   Ace inhibitors   Review of Systems Review of Systems  Constitutional: Negative for chills, diaphoresis and fever.  Eyes: Negative for visual disturbance.  Respiratory: Negative for shortness of breath.   Cardiovascular: Negative for chest pain.  Gastrointestinal: Negative for abdominal pain, nausea and vomiting.  Musculoskeletal: Negative for arthralgias, gait problem, joint swelling and myalgias.  Skin: Negative for color change, rash and wound.  Allergic/Immunologic: Negative for immunocompromised state.  Neurological:  Positive for weakness and numbness. Negative for dizziness.  All other systems reviewed and are negative.    Physical Exam Updated Vital Signs BP (!) 120/93 (BP Location: Right Arm)   Pulse 69   Resp 12   SpO2 100%   Physical Exam Vitals signs and nursing note reviewed.  Constitutional:      General: He is not in acute distress.    Appearance: He is well-developed. He is not diaphoretic.  HENT:     Head: Normocephalic and atraumatic.     Mouth/Throat:     Mouth: Mucous membranes are moist.  Eyes:     General: No visual field deficit.    Extraocular Movements: Extraocular movements intact.     Pupils: Pupils are  equal, round, and reactive to light.  Cardiovascular:     Rate and Rhythm: Normal rate and regular rhythm.     Pulses: Normal pulses.     Heart sounds: Normal heart sounds.  Pulmonary:     Effort: Pulmonary effort is normal.     Breath sounds: Normal breath sounds.  Abdominal:     Tenderness: There is no abdominal tenderness.  Musculoskeletal:     Right lower leg: No edema.     Left lower leg: No edema.  Skin:    General: Skin is warm and dry.     Findings: No erythema or rash.  Neurological:     Mental Status: He is alert and oriented to person, place, and time.     GCS: GCS eye subscore is 4. GCS verbal subscore is 5. GCS motor subscore is 6.     Cranial Nerves: Cranial nerves are intact. No cranial nerve deficit, dysarthria or facial asymmetry.     Sensory: Sensation is intact. No sensory deficit.     Motor: Motor function is intact. No weakness or pronator drift.     Coordination: Rapid alternating movements normal.     Deep Tendon Reflexes: Babinski sign absent on the right side. Babinski sign absent on the left side.  Psychiatric:        Behavior: Behavior normal.      ED Treatments / Results  Labs (all labs ordered are listed, but only abnormal results are displayed) Labs Reviewed  COMPREHENSIVE METABOLIC PANEL - Abnormal; Notable for the  following components:      Result Value   ALT 49 (*)    Alkaline Phosphatase 135 (*)    All other components within normal limits  I-STAT CHEM 8, ED - Abnormal; Notable for the following components:   Calcium, Ion 1.11 (*)    All other components within normal limits  SARS CORONAVIRUS 2 (HOSPITAL ORDER, [place] [facility])  PROTIME-INR  APTT  CBC  DIFFERENTIAL  CBG MONITORING, ED    EKG EKG Interpretation  Date/Time:  Wednesday August 10 2018 14:30:02 EDT Ventricular Rate:  80 PR Interval:  152 QRS Duration: 84 QT Interval:  354 QTC Calculation: 408 R Axis:   76 Text Interpretation:  Normal sinus rhythm T wave abnormality, consider inferolateral ischemia Abnormal ECG No significant change since last tracing Confirmed by Wandra Arthurs [phone]) on 08/10/2018 6:46:13 PM   Radiology Ct Head Wo Contrast  Result Date: 08/10/2018 CLINICAL DATA:  Right hand and foot numbness and tingling for 3-4 days. EXAM: CT HEAD WITHOUT CONTRAST TECHNIQUE: Contiguous axial images were obtained from the base of the skull through the vertex without intravenous contrast. COMPARISON:  Of brain MRI 10/04/2008.  Head CT scan 07/23/2011. FINDINGS: Brain: No evidence of acute infarction, hemorrhage, hydrocephalus, extra-axial collection or mass lesion/mass effect. Vascular: No hyperdense vessel or unexpected calcification. Skull: Intact.  No focal lesion. Sinuses/Orbits: Small mucous retention cyst or polyp left sphenoid sinus noted. Other: None. IMPRESSION: Negative head CT. Electronically Signed   By: Inge Rise M.D.   On: 08/10/2018 15:39    Procedures Procedures (including critical care time)  Medications Ordered in ED Medications  sodium chloride flush (NS) 0.9 % injection 3 mL (has no administration in time range)     Initial Impression / Assessment and Plan / ED Course  I have reviewed the triage vital signs and the nursing notes.  Pertinent labs & imaging results that were  available during my care  of the patient were reviewed by me and considered in my medical decision making (see chart for details).  Clinical Course as of Aug 10 2031  Wed Aug 10, 2018  1959 67yo male sent to the ER after PCP visit today for tingling in the right fingers and toes intermittent for the past 2-3 days. Patient was sent to the ER for possible CVA. Patient reports prior thalamic stroke with tingling in his left fingers and toe and  states this feels the same but now on the right side. Labs and CT head ordered form triage, no new/acute findings. Case discussed with Dr. Darl Householder, ER attending who recommends consult teaching service for admission. Case discussed with teaching service, requests consult with neuro for MRI opinion. Case discussed with Dr. Rory Percy who recommends MRI brain. On further review, patient is unable to have an MRI due to his pacemaker.  Discussed with Dr. Rory Percy, patient can have repeat CT head in 12-24 hours to look for changes.  Consult update, incorrect service paged initially, case discussed with Larkin Ina with teaching service who will consult. Is aware of MRI/CT plan.    [LM]    Clinical Course User Index [LM] Tacy Learn, PA-C      Final Clinical Impressions(s) / ED Diagnoses   Final diagnoses:  Paresthesia    ED Discharge Orders    None       Tacy Learn, PA-C 08/10/18 2009    Tacy Learn, PA-C 08/10/18 2033    Drenda Freeze, MD 08/11/18 1455

## 2018-08-11 ENCOUNTER — Encounter: Payer: Self-pay | Admitting: Cardiology

## 2018-08-11 ENCOUNTER — Observation Stay (HOSPITAL_COMMUNITY): Payer: 59

## 2018-08-11 DIAGNOSIS — E785 Hyperlipidemia, unspecified: Secondary | ICD-10-CM | POA: Diagnosis not present

## 2018-08-11 DIAGNOSIS — Z79899 Other long term (current) drug therapy: Secondary | ICD-10-CM

## 2018-08-11 DIAGNOSIS — I495 Sick sinus syndrome: Secondary | ICD-10-CM | POA: Diagnosis not present

## 2018-08-11 DIAGNOSIS — Z8673 Personal history of transient ischemic attack (TIA), and cerebral infarction without residual deficits: Secondary | ICD-10-CM

## 2018-08-11 DIAGNOSIS — Z66 Do not resuscitate: Secondary | ICD-10-CM

## 2018-08-11 DIAGNOSIS — R202 Paresthesia of skin: Secondary | ICD-10-CM | POA: Diagnosis not present

## 2018-08-11 DIAGNOSIS — Z720 Tobacco use: Secondary | ICD-10-CM

## 2018-08-11 DIAGNOSIS — I10 Essential (primary) hypertension: Secondary | ICD-10-CM | POA: Diagnosis not present

## 2018-08-11 DIAGNOSIS — Z950 Presence of cardiac pacemaker: Secondary | ICD-10-CM

## 2018-08-11 DIAGNOSIS — Z888 Allergy status to other drugs, medicaments and biological substances status: Secondary | ICD-10-CM

## 2018-08-11 DIAGNOSIS — Z7982 Long term (current) use of aspirin: Secondary | ICD-10-CM

## 2018-08-11 LAB — BASIC METABOLIC PANEL
Anion gap: 11 (ref 5–15)
BUN: 19 mg/dL (ref 8–23)
CO2: 25 mmol/L (ref 22–32)
Calcium: 9.2 mg/dL (ref 8.9–10.3)
Chloride: 99 mmol/L (ref 98–111)
Creatinine, Ser: 0.79 mg/dL (ref 0.61–1.24)
GFR calc Af Amer: >60 mL/min (ref >60)
GFR calc non Af Amer: >60 mL/min (ref >60)
Glucose, Bld: 126 mg/dL — ABNORMAL HIGH (ref 70–99)
Potassium: 3.2 mmol/L — ABNORMAL LOW (ref 3.5–5.1)
Sodium: 135 mmol/L (ref 135–145)

## 2018-08-11 LAB — LIPID PANEL
Cholesterol: 187 mg/dL (ref 0–200)
HDL: 53 mg/dL (ref >40)
LDL Cholesterol: 123 mg/dL — ABNORMAL HIGH (ref 0–99)
Total CHOL/HDL Ratio: 3.5 RATIO
Triglycerides: 57 mg/dL (ref <150)
VLDL: 11 mg/dL (ref 0–40)

## 2018-08-11 LAB — HEMOGLOBIN A1C
Hgb A1c MFr Bld: 5.5 % (ref 4.8–5.6)
Mean Plasma Glucose: 111.15 mg/dL

## 2018-08-11 LAB — TSH: TSH: 1.851 u[IU]/mL (ref 0.350–4.500)

## 2018-08-11 LAB — HIV ANTIBODY (ROUTINE TESTING W REFLEX): HIV Screen 4th Generation wRfx: NONREACTIVE

## 2018-08-11 LAB — RPR: RPR Ser Ql: NONREACTIVE

## 2018-08-11 LAB — VITAMIN B12: Vitamin B-12: 311 pg/mL (ref 180–914)

## 2018-08-11 MED ORDER — POTASSIUM CHLORIDE CRYS ER 20 MEQ PO TBCR
40.0000 meq | EXTENDED_RELEASE_TABLET | Freq: Once | ORAL | Status: AC
  Administered 2018-08-11: 40 meq via ORAL
  Filled 2018-08-11: qty 2

## 2018-08-11 NOTE — Discharge Instructions (Signed)
It was a pleasure to take care of you [name]. Your numbness and tingling symptoms are likely from neuropathy versus possible radiculopathy.  We did some imaging and you did not have a stroke.  All of your labs returned normal.  Please make sure to follow with neurology so that they can further evaluate the reason for your symptoms, they would like to do further testing of the nerves. Please follow up with your PCP.    Paresthesia Paresthesia is a burning or prickling feeling. This feeling can happen in any part of the body. It often happens in the hands, arms, legs, or feet. Usually, it is not painful. In most cases, the feeling goes away in a short time and is not a sign of a serious problem. If you have paresthesia that lasts a long time, you may need to be seen by your doctor. Follow these instructions at home: Alcohol use   Do not drink alcohol if: ? Your doctor tells you not to drink. ? You are pregnant, may be pregnant, or are planning to become pregnant.  If you drink alcohol, limit how much you have: ? 0-1 drink a day for women. ? 0-2 drinks a day for men.  Be aware of how much alcohol is in your drink. In the [place] one drink equals one typical bottle of beer (12 oz), one-half glass of wine (5 oz), or one shot of hard liquor (1 oz). Nutrition  Eat a healthy diet. This includes: ? Eating foods that have a lot of fiber in them, such as fresh fruits and vegetables, whole grains, and beans. ? Limiting foods that have a lot of fat and processed sugars in them, such as fried or sweet foods. General instructions  Take over-the-counter and prescription medicines only as told by your doctor.  Do not use any products that have nicotine or tobacco in them, such as cigarettes and e-cigarettes. If you need help quitting, ask your doctor.  If you have diabetes, work with your doctor to make sure your blood sugar stays in a healthy range.  If your feet feel numb: ? Check for redness,  warmth, and swelling every day. ? Wear padded socks and comfortable shoes. These help protect your feet.  Keep all follow-up visits as told by your doctor. This is important. Contact a doctor if:  You have paresthesia that gets worse or does not go away.  Your burning or prickling feeling gets worse when you walk.  You have pain or cramps.  You feel dizzy.  You have a rash. Get help right away if you:  Feel weak.  Have trouble walking or moving.  Have problems speaking, understanding, or seeing.  Feel confused.  Cannot control when you pee (urinate) or poop (have a bowel movement).  Lose feeling (have numbness) after an injury.  Have new weakness in an arm or leg.  Pass out (faint). Summary  Paresthesia is a burning or prickling feeling. It often happens in the hands, arms, legs, or feet.  In most cases, the feeling goes away in a short time and is not a sign of a serious problem.  If you have paresthesia that lasts a long time, you may need to be seen by your doctor. This information is not intended to replace advice given to you by your health care provider. Make sure you discuss any questions you have with your health care provider. Document Released: 02/06/2008 Document Revised: 03/04/2017 Document Reviewed: 03/04/2017 Elsevier Interactive Patient Education  2019 [employer] ° °

## 2018-08-11 NOTE — ED Notes (Signed)
ED TO INPATIENT HANDOFF REPORT  ED Nurse Name and Phone #: [phone]  S Name/Age/Gender [name] 68 y.o. male Room/Bed: 038C/038C  Code Status   Code Status: DNR  Home/SNF/Other Home Patient oriented to: self, place, time and situation Is this baseline? Yes   Triage Complete: Triage complete  Chief Complaint Stroke Like  Triage Note Pt in c/o right hand and foot numbness and tingling that has been off and on for the last 3-4 days, the current episode of numbness started last night and has not resolved, pt went to internal medicine clinic and they brought him here for stroke r/o   Allergies Allergies  Allergen Reactions  . Ace Inhibitors Swelling and Other (See Comments)    Patient presented with right upper lip swelling along with tingling and numbness. Possible allergic reaction to ACEI.    Level of Care/Admitting Diagnosis ED Disposition    ED Disposition Condition [facility]: [place] [[id number]]  Level of Care: Telemetry Medical [104]  Covid Evaluation: Screening Protocol (No Symptoms)  Diagnosis: Right sided numbness [[id number]]  Admitting Physician: Aldine Contes [[id number]]  Attending Physician: Aldine Contes [phone]  Estimated length of stay: past midnight tomorrow  Certification:: I certify this patient will need inpatient services for at least 2 midnights  PT Class (Do Not Modify): Inpatient [101]  PT Acc Code (Do Not Modify): Private [1]       B Medical/Surgery History Past Medical History:  Diagnosis Date  . CEREBROVASCULAR ACCIDENT 10/17/2008   Acute CVA of right thalamus 10/03/08 ECHO performed 2/2 CVA 10/09/08, EF 55-65%  Aspirin 325 mg daily and try to work on quitting smoking.   . Emphysema    per multiple CXRs  . Hypertension    goal < 140/90  . Sickle cell trait ([place])   . Snoring    Sleep study 09/02/10 was WNL  . Stroke [facility]) 10/03/08   Right thalamus  . Tuberculosis at age 66-3   Past  Surgical History:  Procedure Laterality Date  . APPENDECTOMY  1977  . PACEMAKER INSERTION  07/24/08   MDT Adapta L implanted by Dr Blanch Media  . VASECTOMY  1990     A IV Location/Drains/Wounds Patient Lines/Drains/Airways Status   Active Line/Drains/Airways    None          Intake/Output Last 24 hours No intake or output data in the 24 hours ending 08/11/18 0019  Labs/Imaging Results for orders placed or performed during the hospital encounter of 08/10/18 (from the past 48 hour(s))  Protime-INR     Status: None   Collection Time: 08/10/18  2:30 PM  Result Value Ref Range   Prothrombin Time 13.9 11.4 - 15.2 seconds   INR 1.1 0.8 - 1.2    Comment: (NOTE) INR goal varies based on device and disease states. Performed at [facility], [place] [address] [place], [place] [address]   APTT     Status: None   Collection Time: 08/10/18  2:30 PM  Result Value Ref Range   aPTT 32 24 - 36 seconds    Comment: Performed at [facility] [address], [place] [address]  CBC     Status: None   Collection Time: 08/10/18  2:30 PM  Result Value Ref Range   WBC 5.4 4.0 - 10.5 K/uL   RBC 5.24 4.22 - 5.81 MIL/uL   Hemoglobin 15.3 13.0 - 17.0 g/dL   HCT 44.5 39.0 -  52.0 %   MCV 84.9 80.0 - 100.0 fL   MCH 29.2 26.0 - 34.0 pg   MCHC 34.4 30.0 - 36.0 g/dL   RDW 14.8 11.5 - 15.5 %   Platelets 239 150 - 400 K/uL   nRBC 0.0 0.0 - 0.2 %    Comment: Performed at [facility], [place] [address], [place], [place] [address]  Differential     Status: None   Collection Time: 08/10/18  2:30 PM  Result Value Ref Range   Neutrophils Relative % 60 %   Neutro Abs 3.2 1.7 - 7.7 K/uL   Lymphocytes Relative 29 %   Lymphs Abs 1.6 0.7 - 4.0 K/uL   Monocytes Relative 10 %   Monocytes Absolute 0.5 0.1 - 1.0 K/uL   Eosinophils Relative 1 %   Eosinophils Absolute 0.1 0.0 - 0.5 K/uL   Basophils Relative 0 %   Basophils Absolute 0.0 0.0 - 0.1 K/uL   Immature Granulocytes 0 %   Abs  Immature Granulocytes 0.01 0.00 - 0.07 K/uL    Comment: Performed at [facility] [address], [place], [place] [address]  Comprehensive metabolic panel     Status: Abnormal   Collection Time: 08/10/18  2:30 PM  Result Value Ref Range   Sodium 137 135 - 145 mmol/L   Potassium 3.8 3.5 - 5.1 mmol/L   Chloride 101 98 - 111 mmol/L   CO2 26 22 - 32 mmol/L   Glucose, Bld 90 70 - 99 mg/dL   BUN 19 8 - 23 mg/dL   Creatinine, Ser 1.01 0.61 - 1.24 mg/dL   Calcium 9.6 8.9 - 10.3 mg/dL   Total Protein 7.5 6.5 - 8.1 g/dL   Albumin 4.3 3.5 - 5.0 g/dL   AST 41 15 - 41 U/L   ALT 49 (H) 0 - 44 U/L   Alkaline Phosphatase 135 (H) 38 - 126 U/L   Total Bilirubin 1.2 0.3 - 1.2 mg/dL   GFR calc non Af Amer >60 >60 mL/min   GFR calc Af Amer >60 >60 mL/min   Anion gap 10 5 - 15    Comment: Performed at [facility] [address], [place], [place] [address]  I-stat chem 8, ED     Status: Abnormal   Collection Time: 08/10/18  2:46 PM  Result Value Ref Range   Sodium 136 135 - 145 mmol/L   Potassium 3.8 3.5 - 5.1 mmol/L   Chloride 100 98 - 111 mmol/L   BUN 23 8 - 23 mg/dL   Creatinine, Ser 1.00 0.61 - 1.24 mg/dL   Glucose, Bld 92 70 - 99 mg/dL   Calcium, Ion 1.11 (L) 1.15 - 1.40 mmol/L   TCO2 27 22 - 32 mmol/L   Hemoglobin 15.3 13.0 - 17.0 g/dL   HCT 45.0 39.0 - 52.0 %  CBG monitoring, ED     Status: None   Collection Time: 08/10/18  2:56 PM  Result Value Ref Range   Glucose-Capillary 84 70 - 99 mg/dL  SARS Coronavirus 2 (CEPHEID - Performed in [place] hospital lab), Hosp Order     Status: None   Collection Time: 08/10/18  8:43 PM  Result Value Ref Range   SARS Coronavirus 2 NEGATIVE NEGATIVE    Comment: (NOTE) If result is NEGATIVE SARS-CoV-2 target nucleic acids are NOT DETECTED. The SARS-CoV-2 RNA is generally detectable in upper and lower  respiratory specimens during the acute phase of infection. The lowest  concentration of SARS-CoV-2 viral copies this assay can  detect is 250  copies / mL. A negative result does not preclude SARS-CoV-2 infection  and should not be used as the sole basis for treatment or other  patient management decisions.  A negative result may occur with  improper specimen collection / handling, submission of specimen other  than nasopharyngeal swab, presence of viral mutation(s) within the  areas targeted by this assay, and inadequate number of viral copies  (<250 copies / mL). A negative result must be combined with clinical  observations, patient history, and epidemiological information. If result is POSITIVE SARS-CoV-2 target nucleic acids are DETECTED. The SARS-CoV-2 RNA is generally detectable in upper and lower  respiratory specimens dur ing the acute phase of infection.  Positive  results are indicative of active infection with SARS-CoV-2.  Clinical  correlation with patient history and other diagnostic information is  necessary to determine patient infection status.  Positive results do  not rule out bacterial infection or co-infection with other viruses. If result is PRESUMPTIVE POSTIVE SARS-CoV-2 nucleic acids MAY BE PRESENT.   A presumptive positive result was obtained on the submitted specimen  and confirmed on repeat testing.  While 2019 novel coronavirus  (SARS-CoV-2) nucleic acids may be present in the submitted sample  additional confirmatory testing may be necessary for epidemiological  and / or clinical management purposes  to differentiate between  SARS-CoV-2 and other Sarbecovirus currently known to infect humans.  If clinically indicated additional testing with an alternate test  methodology [phone]) is advised. The SARS-CoV-2 RNA is generally  detectable in upper and lower respiratory sp ecimens during the acute  phase of infection. The expected result is Negative. Fact Sheet for Patients:  StrictlyIdeas.no Fact Sheet for Healthcare  Providers: BankingDealers.co.za This test is not yet approved or cleared by the [place] FDA and has been authorized for detection and/or diagnosis of SARS-CoV-2 by FDA under an Emergency Use Authorization (EUA).  This EUA will remain in effect (meaning this test can be used) for the duration of the COVID-19 declaration under Section 564(b)(1) of the Act, 21 U.S.C. section 360bbb-3(b)(1), unless the authorization is terminated or revoked sooner. Performed at [facility], [place] [address] [place], [place] [address]    Ct Head Wo Contrast  Result Date: 08/10/2018 CLINICAL DATA:  Right hand and foot numbness and tingling for 3-4 days. EXAM: CT HEAD WITHOUT CONTRAST TECHNIQUE: Contiguous axial images were obtained from the base of the skull through the vertex without intravenous contrast. COMPARISON:  Of brain MRI 10/04/2008.  Head CT scan 07/23/2011. FINDINGS: Brain: No evidence of acute infarction, hemorrhage, hydrocephalus, extra-axial collection or mass lesion/mass effect. Vascular: No hyperdense vessel or unexpected calcification. Skull: Intact.  No focal lesion. Sinuses/Orbits: Small mucous retention cyst or polyp left sphenoid sinus noted. Other: None. IMPRESSION: Negative head CT. Electronically Signed   By: Inge Rise M.D.   On: 08/10/2018 15:39    Pending Labs Unresulted Labs (From admission, onward)    Start     Ordered   08/11/18 8119  Basic metabolic panel  Tomorrow morning,   R     08/10/18 2133   08/10/18 2128  Lipid panel  Once,   R     08/10/18 2127   08/10/18 2127  Vitamin B12  Once,   R     08/10/18 2127   08/10/18 2127  TSH  Once,   R     08/10/18 2127   08/10/18 2127  Hemoglobin A1c  Once,   R     08/10/18 2127   08/10/18 2127  RPR  Once,   R     08/10/18 2127   08/10/18 2127  HIV Antibody (routine testing w rflx)  Once,   R     08/10/18 2127          Vitals/Pain Today's Vitals   08/10/18 2300 08/10/18 2308 08/10/18 2315  08/10/18 2337  BP: (!) 131/95  119/89   Pulse: 63  60   Resp: 15  18   SpO2: 97%  99%   PainSc:  0-No pain  0-No pain    Isolation Precautions No active isolations  Medications Medications  aspirin EC tablet 81 mg (81 mg Oral Given 08/10/18 2327)  atorvastatin (LIPITOR) tablet 80 mg (80 mg Oral Given 08/10/18 2327)  tamsulosin (FLOMAX) capsule 0.4 mg (has no administration in time range)  albuterol (PROVENTIL) (2.5 MG/3ML) 0.083% nebulizer solution 2.5 mg (has no administration in time range)  enoxaparin (LOVENOX) injection 40 mg (has no administration in time range)  acetaminophen (TYLENOL) tablet 650 mg (has no administration in time range)    Or  acetaminophen (TYLENOL) suppository 650 mg (has no administration in time range)  ondansetron (ZOFRAN) tablet 4 mg (has no administration in time range)    Or  ondansetron (ZOFRAN) injection 4 mg (has no administration in time range)  sodium chloride flush (NS) 0.9 % injection 3 mL (3 mLs Intravenous Given 08/10/18 2327)    Mobility walks Moderate fall risk   Focused Assessments    R Recommendations: See Admitting Provider Note  Report given to:   Additional Notes:

## 2018-08-11 NOTE — Evaluation (Signed)
Physical Therapy Evaluation Patient Details Name: [name] MRN: [id number] DOB: 11/03/1950 Today's Date: 08/11/2018   History of Present Illness  68 y.o male with HTN, HLD, prior CVA without residual deficits, and sick sinus syndrome s/p pacemaker placement presenting to the ED with numbness in right side. CT head was negative. Unable to perform MRI due to pacemaker.  Clinical Impression  Patient appears to be at baseline level of functioning. He had no loss of balance with high level balance tests. No need for skilled therapy at this time.     Follow Up Recommendations No PT follow up    Equipment Recommendations  None recommended by PT    Recommendations for Other Services       Precautions / Restrictions Precautions Precautions: None Restrictions Weight Bearing Restrictions: No      Mobility  Bed Mobility Overal bed mobility: Modified Independent             General bed mobility comments: no assist required   Transfers Overall transfer level: Modified independent Equipment used: None                Ambulation/Gait Ambulation/Gait assistance: Independent Gait Distance (Feet): 100 Feet         General Gait Details: ambualted turning head, nodding head, and changing speeds without difficulty   Stairs            Wheelchair Mobility    Modified Rankin (Stroke Patients Only)       Balance Overall balance assessment: No apparent balance deficits (not formally assessed)                             High Level Balance Comments: tandem stance EO/EC no loss of balance; narrow base no loss of balance              Pertinent Vitals/Pain Pain Assessment: No/denies pain    Home Living Family/patient expects to be discharged to:: Private residence Living Arrangements: Spouse/significant other Available Help at Discharge: Family Type of Home: Apartment Home Access: Level entry     Home Layout: One level Home Equipment:  None      Prior Function Level of Independence: Independent         Comments: Indepdnent with all acitivity      Hand Dominance   Dominant Hand: Right    Extremity/Trunk Assessment   Upper Extremity Assessment Upper Extremity Assessment: Defer to OT evaluation RUE Deficits / Details: pt reports some tingling to fingertips upon initial admission which has mostly subsided at this time    Lower Extremity Assessment Lower Extremity Assessment: Overall WFL for tasks assessed    Cervical / Trunk Assessment Cervical / Trunk Assessment: Normal  Communication   Communication: No difficulties  Cognition Arousal/Alertness: Awake/alert Behavior During Therapy: Flat affect Overall Cognitive Status: Within Functional Limits for tasks assessed                                 General Comments: pt not very engaging or interactive with therapist. would not make eye contact or look towards therapist when sitting on EOB answering therapist questions      General Comments General comments (skin integrity, edema, etc.): VSS during session    Exercises     Assessment/Plan    PT Assessment Patent does not need any further PT services  PT Problem List  PT Treatment Interventions      PT Goals (Current goals can be found in the Care Plan section)  Acute Rehab PT Goals Patient Stated Goal: home    Frequency     Barriers to discharge        Co-evaluation               AM-PAC PT "6 Clicks" Mobility  Outcome Measure Help needed turning from your back to your side while in a flat bed without using bedrails?: None Help needed moving from lying on your back to sitting on the side of a flat bed without using bedrails?: None Help needed moving to and from a bed to a chair (including a wheelchair)?: None Help needed standing up from a chair using your arms (e.g., wheelchair or bedside chair)?: None Help needed to walk in hospital room?: None Help needed  climbing 3-5 steps with a railing? : None 6 Click Score: 24    End of Session Equipment Utilized During Treatment: Gait belt Activity Tolerance: Patient tolerated treatment well Patient left: in bed;with call bell/phone within reach;with bed alarm set Nurse Communication: Mobility status PT Visit Diagnosis: Other abnormalities of gait and mobility (R26.89)    Time: 2671-2458 PT Time Calculation (min) (ACUTE ONLY): 15 min   Charges:   PT Evaluation $PT Eval Low Complexity: 1 Low           Carney Living PT DPT  08/11/2018, 10:21 AM

## 2018-08-11 NOTE — Care Management CC44 (Signed)
Condition Code 44 Documentation Completed  Patient Details  Name: JIOVANNI HEETER MRN: [id number] Date of Birth: 06-Oct-1950   Condition Code 44 given:  Yes Patient signature on Condition Code 44 notice:  Yes Documentation of 2 MD's agreement:  Yes Code 44 added to claim:  Yes    Zenon Mayo, RN 08/11/2018, 12:10 PM

## 2018-08-11 NOTE — Progress Notes (Signed)
Remote pacemaker transmission.   

## 2018-08-11 NOTE — Care Management Obs Status (Signed)
[facility] NOTIFICATION   Patient Details  Name: AMEYA KUTZ MRN: [id number] Date of Birth: 1950-11-19   Medicare Observation Status Notification Given:  Yes    Zenon Mayo, RN 08/11/2018, 12:10 PM

## 2018-08-11 NOTE — Discharge Summary (Signed)
Name: [name] MRN: [id number] DOB: 03-25-1950 68 y.o. PCP: [name], DO  Date of Admission: 08/10/2018  2:30 PM Date of Discharge: 08/11/18 Attending Physician: Aldine Contes, MD  Discharge Diagnosis: 1. Right upper and lower digit neuropathy vs radiculopathy  2. Hypertension  Discharge Medications: Allergies as of 08/11/2018      Reactions   Ace Inhibitors Swelling, Other (See Comments)   Patient presented with right upper lip swelling along with tingling and numbness. Possible allergic reaction to ACEI.      Medication List    TAKE these medications   albuterol 108 (90 Base) MCG/ACT inhaler Commonly known as:  VENTOLIN HFA Inhale 2 puffs into the lungs every 6 (six) hours as needed for wheezing or shortness of breath.   amLODipine 10 MG tablet Commonly known as:  NORVASC Take 1 tablet (10 mg total) by mouth daily.   aspirin EC 81 MG tablet Take 81 mg by mouth at bedtime.   atorvastatin 80 MG tablet Commonly known as:  Lipitor Take 1 tablet (80 mg total) by mouth daily. What changed:  when to take this   carvedilol 3.125 MG tablet Commonly known as:  Coreg Take 1 tablet (3.125 mg total) by mouth 2 (two) times daily with a meal.   Diclofenac Potassium 25 MG Caps Take 1 capsule (25 mg total) by mouth 2 (two) times daily between meals as needed.   hydrochlorothiazide 25 MG tablet Commonly known as:  HYDRODIURIL Take 1 tablet (25 mg total) by mouth daily.   tamsulosin 0.4 MG Caps capsule Commonly known as:  FLOMAX Take 1 capsule (0.4 mg total) by mouth daily.   varenicline 0.5 MG X 11 & 1 MG X 42 tablet Commonly known as:  Chantix Starting Month Pak Take 0.5 mg tablet daily for 3 days, then increase to 0.5 mg tablet twice daily for 4 days, then increase to 1 mg tablet twice daily. What changed:  Another medication with the same name was changed. Make sure you understand how and when to take each.   varenicline 1 MG tablet Commonly known as:   Chantix Continuing Month Pak Take 1 tablet (1 mg total) by mouth 2 (two) times daily. What changed:  when to take this       Disposition and follow-up:   [name] was [facility] Good condition.  At the hospital follow up visit please address:  1.  Right digit Neuropathy vs. Radiculopathy: Please have patient follow up with neurology for nerve conduction studies and EMG  2.  Labs / imaging needed at time of follow-up: Nerve conduction studies and EMG  3.  Pending labs/ test needing follow-up: none  Follow-up Appointments: Follow-up Information    [name], DO. Go on 08/18/2018.   Specialty:  Internal Medicine Why:  Follow up in [facility] 1:45pm Contact information: [place] [address] [phone]        [facility]. Go to.   Contact information: [place], [place] [facility] Course by problem list:  1. Neuropathy vs radiculopathy  Patient presented with right sided upper and lower digit neuropathy in all 5 digits for the past 3-4 days. Patient also had horizontal saccades with right eye lag that neurology thought was due to htn and smoking history. No other cranial nerve deficits or weakness was appreciated. Initial CT  did not show any acute intracranial abnormalities. Neurology was consulted and they recommended repeat CT in 24 hrs as patient has pacemaker that is not compatible with MRI. Repeat CT showed right basal ganglia lacunar infarct. TSH, Vitamin B12, RPR were within normal range. A1c 5.5.  Patient's symptoms had resolved on day of discharge. No recommendations were made by PT.   The patient is to follow up with neurology outpatient to get nerve conduction/EMG they did not feel that the patient's symptoms were consistent with a CVA.   Hypertension The patient's blood pressure during this admission had ranged  120-140s/80-90s. Will recommend the patient resume home amlodipine, carvedilol on discharge.   Discharge Vitals:   BP (!) 132/99 (BP Location: Left Arm)   Pulse 83   Temp 97.7 F (36.5 C) (Oral)   Resp 14   SpO2 100%   Pertinent Labs, Studies, and Procedures:   CT head 08/10/18 Negative head CT  CT head 08/11/18 Low-density within the right basal ganglia compatible with age-indeterminate lacunar infarct.  Discharge Instructions: Discharge Instructions    Call MD for:  difficulty breathing, headache or visual disturbances   Complete by:  As directed    Call MD for:  extreme fatigue   Complete by:  As directed    Call MD for:  hives   Complete by:  As directed    Call MD for:  persistant dizziness or light-headedness   Complete by:  As directed    Call MD for:  persistant nausea and vomiting   Complete by:  As directed    Call MD for:  redness, tenderness, or signs of infection (pain, swelling, redness, odor or green/yellow discharge around incision site)   Complete by:  As directed    Call MD for:  severe uncontrolled pain   Complete by:  As directed    Call MD for:  temperature >100.4   Complete by:  As directed    Diet - low sodium heart healthy   Complete by:  As directed    Discharge instructions   Complete by:  As directed    It was a pleasure to take care of you Mr. [name]. Your numbness and tingling symptoms are likely from neuropathy versus possible radiculopathy.  We did some imaging and you did not have a stroke.  All of your labs returned normal.  Please make sure to follow with neurology so that they can further evaluate the reason for your symptoms, they would like to do further testing of the nerves.   Increase activity slowly   Complete by:  As directed       Signed: Lars Mage, MD 08/11/2018, 11:19 AM   Pager: [phone]

## 2018-08-11 NOTE — Progress Notes (Signed)
Patient to be discharged home.  IV removed with the catheter intact.  Discharge intructions given with the patient verbalizing understanding.

## 2018-08-11 NOTE — Evaluation (Signed)
Occupational Therapy Evaluation Patient Details Name: [name] MRN: [id number] DOB: 08-06-1950 Today's Date: 08/11/2018    History of Present Illness 68 y.o male with HTN, HLD, prior CVA without residual deficits, and sick sinus syndrome s/p pacemaker placement presenting to the ED with numbness in right side. CT head was negative. Unable to perform MRI due to pacemaker.   Clinical Impression   OT eval completed with no further acute OT needs identified at this time. Pt is a 68 y/o male presents with the above. PTA pt reports independence with ADL, iADL and functional mobility. Pt somewhat disengaged during session, requires encouragement to participate and does not look towards therapist or make eye contact with therapist when answering questions. Pt demonstrating LB ADL, room and hallway mobility without AD at mod independent level throughout. Pt denies vision changes and with equal bil UE strength noted throughout; pt reporting initial symptoms have subsided since admission and he feels at his baseline re: ADL and mobility completion. Educated pt in signs/symptoms of stroke and BEFAST acronym with pt verbalizing understanding. Pt reports he lives with spouse who is able to assist if needed. Questions answered throughout. Do not anticipate pt will require follow up OT services after discharge. Acute OT to sign off. Thank you for this referral.     Follow Up Recommendations  No OT follow up    Equipment Recommendations  None recommended by OT           Precautions / Restrictions Precautions Precautions: None Restrictions Weight Bearing Restrictions: No      Mobility Bed Mobility Overal bed mobility: Modified Independent                Transfers Overall transfer level: Modified independent Equipment used: None                  Balance Overall balance assessment: No apparent balance deficits (not formally assessed)                                          ADL either performed or assessed with clinical judgement   ADL Overall ADL's : At baseline                                       General ADL Comments: pt performing functional mobility and mod independent level without AD, donned socks EOB without difficulty; declined performing further ADL this session but given seated LB task and level of mobility feel pt is likely at his baseline; pt also endorses feeling at his baseline for ADL completion      Vision Baseline Vision/History: Wears glasses Wears Glasses: Reading only Patient Visual Report: No change from baseline Vision Assessment?: No apparent visual deficits     Perception     Praxis      Pertinent Vitals/Pain Pain Assessment: No/denies pain     Hand Dominance Right   Extremity/Trunk Assessment Upper Extremity Assessment Upper Extremity Assessment: Overall WFL for tasks assessed;RUE deficits/detail RUE Deficits / Details: pt reports some tingling to fingertips upon initial admission which has mostly subsided at this time   Lower Extremity Assessment Lower Extremity Assessment: Defer to PT evaluation   Cervical / Trunk Assessment Cervical / Trunk Assessment: Normal   Communication Communication Communication: No difficulties   Cognition Arousal/Alertness: Awake/alert  Behavior During Therapy: Flat affect Overall Cognitive Status: Within Functional Limits for tasks assessed                                 General Comments: pt not very engaging or interactive with therapist. would not make eye contact or look towards therapist when sitting on EOB answering therapist questions   General Comments  VSS during session    Exercises     Shoulder Instructions      Home Living Family/patient expects to be discharged to:: Private residence Living Arrangements: Spouse/significant other Available Help at Discharge: Family Type of Home: Apartment Home Access: Level entry      Home Layout: One level     Bathroom Shower/Tub: Teacher, early years/pre: Standard     Home Equipment: None          Prior Functioning/Environment Level of Independence: Independent                 OT Problem List: Decreased activity tolerance;Impaired sensation      OT Treatment/Interventions:      OT Goals(Current goals can be found in the care plan section) Acute Rehab OT Goals Patient Stated Goal: home OT Goal Formulation: All assessment and education complete, DC therapy  OT Frequency:     Barriers to D/C:            Co-evaluation              AM-PAC OT "6 Clicks" Daily Activity     Outcome Measure Help from another person eating meals?: None Help from another person taking care of personal grooming?: None Help from another person toileting, which includes using toliet, bedpan, or urinal?: None Help from another person bathing (including washing, rinsing, drying)?: None Help from another person to put on and taking off regular upper body clothing?: None Help from another person to put on and taking off regular lower body clothing?: None 6 Click Score: 24   End of Session Nurse Communication: Mobility status  Activity Tolerance: Patient tolerated treatment well(somewhat limited as pt minimally participatory ) Patient left: in bed;with call bell/phone within reach;with nursing/sitter in room(NT present)  OT Visit Diagnosis: Other symptoms and signs involving the nervous system (H47.425)                Time: 9563-8756 OT Time Calculation (min): 11 min Charges:  OT General Charges $OT Visit: 1 Visit OT Evaluation $OT Eval Moderate Complexity: 1 Mod  [name], [place] Pager [phone] Office [phone]   [name] 08/11/2018, 9:48 AM

## 2018-08-11 NOTE — Plan of Care (Signed)
Progressing towards goals

## 2018-08-11 NOTE — Progress Notes (Signed)
   Subjective: [name] reports that he is doing well today, no acute events overnight.  He reports that he no longer has numbness or tingling in his fingers or toes.  He reports that he has been having this off and on again for the past 3 to 4 days and his wife requested him to come to the ED to be evaluated.  He states that it occurs randomly. He denies any new complaints today.  Objective:  Vital signs in last 24 hours: Vitals:   08/11/18 0015 08/11/18 0126 08/11/18 0322 08/11/18 0626  BP: (!) 137/94 134/88 120/81 128/84  Pulse: (!) 53  63 65  Resp: 15 12 18  (!) 21  Temp:  98.4 F (36.9 C) 98.8 F (37.1 C)   TempSrc:  Oral Oral   SpO2: 99% 100% 100% 98%    General: Middle-aged male, no acute distress, lying in bed Cardiac: Regular rate and rhythm, no murmurs rubs or gallops Pulmonary: Clear to auscultation bilaterally, no wheezing or rhonchi Extremity: No numbness or tingling in digits, sensation to light touch intact and symmetric, 5 out of 5 strength in upper and lower extremity Assessment/Plan:  Active Problems:   Right sided numbness  This is a 68 year old male with a history of hypertension, hyperlipidemia, prior CVA, and sick sinus syndrome status post pacemaker placement who presented with a 3 to 4-day history of right fingers and toes numbness, cold feeling on his right side, occasional dizziness and weakness.  Found to have decreased sensation in those areas, with horizontal saccades. CT scan showed no acute findings. Neuro reported that this could be due to underlying HTN, HLD, or smoking.  Right sided numbness: Work up thus far has been negative, neuro recommended repeating CT head in 12-24 hours after first (pacemaker so unable to do MRI). Lipid panel showed cholesterol 183, HDL 53, LDL 125. A1c 5.3. TSH 1.85. B12 311.  Patient reports that his symptoms have resolved, his neurological exam was unremarkable.  PT and OT evaluated and have no further recommendations.   Repeat CT scan showed low density in the right basilar ganglia compatible with age indeterminate lacunar infarct. Discussed with neurology who does not feel that this is related to patients symptoms and reported that this was present on prior CT, they recommend doing an EMG outpatient.  -No further inpatient work up at this time -Resume home ASA and atorvastatin on discharge -Will need EMG outpatient  Prior CVA: -Right thalamus in 2010, no residual deficits. Continue ASA 81 mg  HTN: She is on amlodipine 10 mg, carvedilol 3.125 mg twice daily, HCTZ 25 mg daily.  These were held initially due to concern for stroke versus TIA.  Repeat scan showed low density in the right basal ganglia consistent with age-indeterminate lacunar infarct, findings not consistent with his initial presentation.  Will resume home medications on discharge.  FEN: No fluids, replete lytes prn, HH diet VTE ppx: Lovenox  Code Status: DNR   Dispo: Anticipated discharge in approximately today.   Craig Noble, MD 08/11/2018, 6:52 AM Pager: [phone]

## 2018-08-18 ENCOUNTER — Ambulatory Visit (INDEPENDENT_AMBULATORY_CARE_PROVIDER_SITE_OTHER): Payer: 59 | Admitting: Internal Medicine

## 2018-08-18 ENCOUNTER — Other Ambulatory Visit: Payer: Self-pay

## 2018-08-18 VITALS — BP 124/96 | HR 88 | Temp 98.8°F | Ht 73.0 in | Wt 164.4 lb

## 2018-08-18 DIAGNOSIS — R531 Weakness: Secondary | ICD-10-CM

## 2018-08-18 DIAGNOSIS — R200 Anesthesia of skin: Secondary | ICD-10-CM

## 2018-08-18 NOTE — Progress Notes (Signed)
   CC: Hospital follow up parasthesia  HPI:  [name] is a 68 y.o. M with PMHx listed below presenting for Hospital follow up parasthesia. Please see the A&P for the status of the patient's chronic medical problems.   Past Medical History:  Diagnosis Date  . CEREBROVASCULAR ACCIDENT 10/17/2008   Acute CVA of right thalamus 10/03/08 ECHO performed 2/2 CVA 10/09/08, EF 55-65%  Aspirin 325 mg daily and try to work on quitting smoking.   . Emphysema    per multiple CXRs  . Hypertension    goal < 140/90  . Sickle cell trait ([place])   . Snoring    Sleep study 09/02/10 was WNL  . Stroke [facility]) 10/03/08   Right thalamus  . Tuberculosis at age 62-3   Review of Systems:  Performed and all others negative.  Physical Exam:  Vitals:   08/18/18 1325  BP: (!) 124/96  Pulse: 88  Temp: 98.8 F (37.1 C)  TempSrc: Oral  SpO2: 100%  Weight: 164 lb 6.4 oz (74.6 kg)  Height: 6\' 1"  (1.854 m)   Physical Exam Constitutional:      General: He is not in acute distress.    Appearance: Normal appearance.  Cardiovascular:     Rate and Rhythm: Normal rate and regular rhythm.     Pulses: Normal pulses.     Heart sounds: Normal heart sounds.  Pulmonary:     Effort: Pulmonary effort is normal. No respiratory distress.     Breath sounds: Normal breath sounds.  Abdominal:     General: Bowel sounds are normal. There is no distension.     Palpations: Abdomen is soft.     Tenderness: There is no abdominal tenderness.  Musculoskeletal:        General: No swelling or deformity.  Skin:    General: Skin is warm and dry.  Neurological:     General: No focal deficit present.     Mental Status: Mental status is at baseline.     Sensory: No sensory deficit.     Motor: No weakness.    Assessment & Plan:   See Encounters Tab for problem based charting.  Patient discussed with Dr. Evette Doffing

## 2018-08-18 NOTE — Patient Instructions (Addendum)
Thank you for allowing [place] to care for you  For your right-sided numbness and tingling - Please follow up with Neurology, please call to set an appointment [facility]. Go to.   Contact information: [address] [place], [place] [phone]

## 2018-08-18 NOTE — Assessment & Plan Note (Addendum)
Patient was admitted for TIA/CVA rule out due to acute right sides paraesthesias and intermittent weakness. A stroke or TIA was deemed to be unlikely. No MRI was performed due to incompatible cardiac device. CT showed findings consistent with those of his prior stroke with right basal ganglia lacunar infarct. TSH, Vitamin B12, RPR were within normal range. A1c 5.5. Neurology suspects Neuropathy vs radiculopathy and plan for EMG studies at follow up. He left his AVS at the hospital and did not have the neurologist's information. This was provided and he will make an appointment. - Follow up with neurology.

## 2018-08-19 NOTE — Progress Notes (Signed)
Internal Medicine Clinic Attending  Case discussed with Dr. Melvin  at the time of the visit.  We reviewed the resident's history and exam and pertinent patient test results.  I agree with the assessment, diagnosis, and plan of care documented in the resident's note.  

## 2018-09-01 NOTE — Addendum Note (Signed)
Addended by: Orson Gear on: 09/01/2018 12:19 PM   Modules accepted: Orders

## 2018-09-06 ENCOUNTER — Encounter: Payer: Self-pay | Admitting: Neurology

## 2018-10-24 ENCOUNTER — Ambulatory Visit (INDEPENDENT_AMBULATORY_CARE_PROVIDER_SITE_OTHER): Payer: 59 | Admitting: Neurology

## 2018-10-24 DIAGNOSIS — Z5329 Procedure and treatment not carried out because of patient's decision for other reasons: Secondary | ICD-10-CM

## 2018-10-25 NOTE — Progress Notes (Signed)
Rescheduled

## 2018-11-03 ENCOUNTER — Encounter: Payer: 59 | Admitting: *Deleted

## 2018-11-25 ENCOUNTER — Ambulatory Visit (INDEPENDENT_AMBULATORY_CARE_PROVIDER_SITE_OTHER): Payer: 59 | Admitting: *Deleted

## 2018-11-25 DIAGNOSIS — I495 Sick sinus syndrome: Secondary | ICD-10-CM

## 2018-11-26 LAB — CUP PACEART REMOTE DEVICE CHECK
Battery Impedance: 1182 Ohm
Battery Remaining Longevity: 59 mo
Battery Voltage: 2.78 V
Brady Statistic AP VP Percent: 0 %
Brady Statistic AP VS Percent: 36 %
Brady Statistic AS VP Percent: 0 %
Brady Statistic AS VS Percent: 64 %
Date Time Interrogation Session: 20200918224236
Implantable Lead Implant Date: 20101105
Implantable Lead Implant Date: 20101105
Implantable Lead Location: 753859
Implantable Lead Location: 753860
Implantable Lead Model: 5076
Implantable Lead Model: 5076
Implantable Pulse Generator Implant Date: 20101105
Lead Channel Impedance Value: 465 Ohm
Lead Channel Impedance Value: 472 Ohm
Lead Channel Pacing Threshold Amplitude: 0.75 V
Lead Channel Pacing Threshold Amplitude: 0.875 V
Lead Channel Pacing Threshold Pulse Width: 0.4 ms
Lead Channel Pacing Threshold Pulse Width: 0.4 ms
Lead Channel Setting Pacing Amplitude: 2 V
Lead Channel Setting Pacing Amplitude: 2.5 V
Lead Channel Setting Pacing Pulse Width: 0.4 ms
Lead Channel Setting Sensing Sensitivity: 4 mV

## 2018-11-28 ENCOUNTER — Encounter: Payer: Self-pay | Admitting: Cardiology

## 2018-11-28 NOTE — Progress Notes (Signed)
Remote pacemaker transmission.   

## 2018-12-19 ENCOUNTER — Other Ambulatory Visit: Payer: Self-pay

## 2018-12-19 ENCOUNTER — Ambulatory Visit (INDEPENDENT_AMBULATORY_CARE_PROVIDER_SITE_OTHER): Payer: 59 | Admitting: Neurology

## 2018-12-19 ENCOUNTER — Encounter: Payer: Self-pay | Admitting: Neurology

## 2018-12-19 VITALS — BP 140/86 | HR 86 | Ht 73.0 in | Wt 171.0 lb

## 2018-12-19 DIAGNOSIS — R202 Paresthesia of skin: Secondary | ICD-10-CM

## 2018-12-19 NOTE — Progress Notes (Signed)
[facility] Clinic Note - Initial Visit   Date: 12/19/18  [name] MRN: [id number] DOB: 02-Oct-1950   Dear Dr. Heber [place]:  Thank you for your kind referral of [name] for consultation of numbness of the right hand and toes. Although his history is well known to you, please allow [place] to reiterate it for the purpose of our medical record. The patient was accompanied to the clinic by self.     History of Present Illness: [name] is a 68 y.o. right-handed male with history of stroke (2010, manifesting with vision changes), sick sinus syndrome  S/p PPM, history of substance abuse, and tobacco use presenting for evaluation of right finger and toe numbness.   In early June, he developed acute onset of numbness in the right fingertips and toes.  He did not have sensation involving the right arm, leg, face.  There was no associated weakness.  He went to the emergency department for evaluation for CT head showed old right basal ganglia lacunar stroke, no acute findings.  Repeat CT the following day was stable.  He was unable to get MRI due to pacemaker.  His symptoms resolved within 1 day and have not recurred.  He admits to being noncompliant with his blood pressure medications and aspirin, due to unwanted sexual side effects and not wanting to take medications.  He works as Brewing technologist.  Out-side paper records, electronic medical record, and images have been reviewed where available and summarized as:  CT head wo contrast 08/11/2018: Low-density within the right basal ganglia compatible with age-indeterminate lacunar infarct.  CT head wo contrast 08/10/2018:  Negative  Lab Results  Component Value Date   CHOL 187 08/10/2018   HDL 53 08/10/2018   LDLCALC 123 (H) 08/10/2018   TRIG 57 08/10/2018   CHOLHDL 3.5 08/10/2018    Lab Results  Component Value Date   HGBA1C 5.5 08/10/2018   Lab Results  Component Value Date   VITAMINB12 311 08/10/2018   Lab Results  Component Value Date   TSH 1.851 08/10/2018     Past Medical History:  Diagnosis Date  . CEREBROVASCULAR ACCIDENT 10/17/2008   Acute CVA of right thalamus 10/03/08 ECHO performed 2/2 CVA 10/09/08, EF 55-65%  Aspirin 325 mg daily and try to work on quitting smoking.   . Emphysema    per multiple CXRs  . Hypertension    goal < 140/90  . Sickle cell trait ([place])   . Snoring    Sleep study 09/02/10 was WNL  . Stroke [facility]) 10/03/08   Right thalamus  . Tuberculosis at age 70-3    Past Surgical History:  Procedure Laterality Date  . APPENDECTOMY  1977  . PACEMAKER INSERTION  07/24/08   MDT Adapta L implanted by Dr Blanch Media  . VASECTOMY  1990     Medications:  Outpatient Encounter Medications as of 12/19/2018  Medication Sig Note  . albuterol (PROVENTIL HFA;VENTOLIN HFA) 108 (90 Base) MCG/ACT inhaler Inhale 2 puffs into the lungs every 6 (six) hours as needed for wheezing or shortness of breath. (Patient not taking: Reported on 12/19/2018)   . amLODipine (NORVASC) 10 MG tablet Take 1 tablet (10 mg total) by mouth daily. (Patient not taking: Reported on 12/19/2018)   . aspirin EC 81 MG tablet Take 81 mg by mouth at bedtime.   Marland Kitchen atorvastatin (LIPITOR) 80 MG tablet Take 1 tablet (80 mg total) by mouth daily. (Patient not taking: Reported on 12/19/2018)   .  carvedilol (COREG) 3.125 MG tablet Take 1 tablet (3.125 mg total) by mouth 2 (two) times daily with a meal. (Patient not taking: Reported on 12/19/2018)   . Diclofenac Potassium 25 MG CAPS Take 1 capsule (25 mg total) by mouth 2 (two) times daily between meals as needed. (Patient not taking: Reported on 08/10/2018)   . hydrochlorothiazide (HYDRODIURIL) 25 MG tablet Take 1 tablet (25 mg total) by mouth daily. (Patient not taking: Reported on 12/19/2018)   . tamsulosin (FLOMAX) 0.4 MG CAPS capsule Take 1 capsule (0.4 mg total) by mouth daily.   . varenicline (CHANTIX CONTINUING MONTH PAK) 1 MG tablet Take  1 tablet (1 mg total) by mouth 2 (two) times daily. (Patient taking differently: Take 1 mg by mouth daily. ) 08/10/2018: Patient opts to take only once a day  . varenicline (CHANTIX STARTING MONTH PAK) 0.5 MG X 11 & 1 MG X 42 tablet Take 0.5 mg tablet daily for 3 days, then increase to 0.5 mg tablet twice daily for 4 days, then increase to 1 mg tablet twice daily. (Patient not taking: Reported on 08/10/2018) 08/10/2018: Patient lost these   No facility-administered encounter medications on file as of 12/19/2018.     Allergies:  Allergies  Allergen Reactions  . Ace Inhibitors Swelling and Other (See Comments)    Patient presented with right upper lip swelling along with tingling and numbness. Possible allergic reaction to ACEI.    Family History: Family History  Problem Relation Age of Onset  . Cancer Sister   . Hypertension Mother   . Sickle cell anemia Son     Social History: Social History   Tobacco Use  . Smoking status: Current Every Day Smoker    Packs/day: 0.30    Years: 10.00    Pack years: 3.00    Types: Cigarettes  . Smokeless tobacco: Never Used  . Tobacco comment: Cutting back. DOWN TO 2-3 CIAGARETTES  Substance Use Topics  . Alcohol use: No    Alcohol/week: 0.0 standard drinks  . Drug use: No    Comment: previous polysubstance abuser, quit x 13 years   Social History   Social History Narrative   Lives in [place].  Works in [employer], 6 children   Right handed   One story house    Review of Systems:  CONSTITUTIONAL: No fevers, chills, night sweats, or weight loss.   EYES: No visual changes or eye pain ENT: No hearing changes.  No history of nose bleeds.   RESPIRATORY: No cough, wheezing and shortness of breath.   CARDIOVASCULAR: Negative for chest pain, and palpitations.   GI: Negative for abdominal discomfort, blood in stools or black stools.  No recent change in bowel habits.   GU:  No history of incontinence.   MUSCLOSKELETAL: No history of  joint pain or swelling.  No myalgias.   SKIN: Negative for lesions, rash, and itching.   HEMATOLOGY/ONCOLOGY: Negative for prolonged bleeding, bruising easily, and swollen nodes.  No history of cancer.   ENDOCRINE: Negative for cold or heat intolerance, polydipsia or goiter.   PSYCH:  No depression or anxiety symptoms.   NEURO: As Above.   Vital Signs:  BP 140/86   Pulse 86   Ht 6\' 1"  (1.854 m)   Wt 171 lb (77.6 kg)   SpO2 99%   BMI 22.56 kg/m    General Medical Exam:   General:  Well appearing, comfortable.   Eyes/ENT: see cranial nerve examination.   Neck:   No  carotid bruits. Respiratory:  Clear to auscultation, good air entry bilaterally.   Cardiac:  Regular rate and rhythm, no murmur.   Extremities:  No deformities, edema, or skin discoloration.  Skin:  No rashes or lesions.  Neurological Exam: MENTAL STATUS including orientation to time, place, person, recent and remote memory, attention span and concentration, language, and fund of knowledge is normal.  Speech is not dysarthric.  CRANIAL NERVES: II:  No visual field defects.    III-IV-VI: Pupils equal round and reactive to light.  Normal conjugate, extra-ocular eye movements in all directions of gaze.  No nystagmus.  No ptosis.   V:  Normal facial sensation.    VII:  Normal facial symmetry and movements.   VIII:  Normal hearing and vestibular function.   IX-X:  Normal palatal movement.   XI:  Normal shoulder shrug and head rotation.   XII:  Normal tongue strength and range of motion, no deviation or fasciculation.  MOTOR:  No atrophy, fasciculations or abnormal movements.  No pronator drift.   Upper Extremity:  Right  Left  Deltoid  5/5   5/5   Biceps  5/5   5/5   Triceps  5/5   5/5   Infraspinatus 5/5  5/5  Medial pectoralis 5/5  5/5  Wrist extensors  5/5   5/5   Wrist flexors  5/5   5/5   Finger extensors  5/5   5/5   Finger flexors  5/5   5/5   Dorsal interossei  5/5   5/5   Abductor pollicis  5/5   5/5    Tone (Ashworth scale)  0  0   Lower Extremity:  Right  Left  Hip flexors  5/5   5/5   Hip extensors  5/5   5/5   Adductor 5/5  5/5  Abductor 5/5  5/5  Knee flexors  5/5   5/5   Knee extensors  5/5   5/5   Dorsiflexors  5/5   5/5   Plantarflexors  5/5   5/5   Toe extensors  5/5   5/5   Toe flexors  5/5   5/5   Tone (Ashworth scale)  0  0   MSRs:  Right        Left                  brachioradialis 2+  2+  biceps 2+  2+  triceps 2+  2+  patellar 2+  2+  ankle jerk 2+  2+  Hoffman no  no  plantar response down  down   SENSORY:  Normal and symmetric perception of light touch, pinprick, vibration, and proprioception.  Romberg's sign absent.   COORDINATION/GAIT: Normal finger-to- nose-finger.  Intact rapid alternating movements bilaterally.  Able to rise from a chair without using arms.  Gait narrow based and stable. Tandem and stressed gait intact.    IMPRESSION: Transient numbness of the right fingers and toes, resolved (unknown etiology).  He has not had any further spells of lateralizing numbness or tingling.  Symptoms are atypical for a TIA to manifest with such distal paresthesias.  CT brain was personally reviewed and does not show acute stroke to explain symptoms, he does have an old right basal ganglia stroke, which he seems to have recovered from well. I have stressed the importance of medication compliance, especially taking aspirin daily Tobacco cessation was encouraged.  Return to clinic as needed.  Thank you for allowing me to participate  in patient's care.  If I can answer any additional questions, I would be pleased to do so.    Sincerely,    Kateria Cutrona K. Posey Pronto, DO

## 2018-12-19 NOTE — Patient Instructions (Signed)
Encouraged compliance with daily aspirin  If your tingling returns, come back and see me

## 2019-01-02 ENCOUNTER — Ambulatory Visit (INDEPENDENT_AMBULATORY_CARE_PROVIDER_SITE_OTHER): Payer: 59 | Admitting: *Deleted

## 2019-01-02 ENCOUNTER — Other Ambulatory Visit: Payer: Self-pay

## 2019-01-02 DIAGNOSIS — Z23 Encounter for immunization: Secondary | ICD-10-CM

## 2019-03-22 ENCOUNTER — Ambulatory Visit (INDEPENDENT_AMBULATORY_CARE_PROVIDER_SITE_OTHER): Payer: 59 | Admitting: Internal Medicine

## 2019-03-22 ENCOUNTER — Encounter: Payer: Self-pay | Admitting: Internal Medicine

## 2019-03-22 VITALS — BP 153/108 | HR 65 | Temp 98.2°F | Ht 73.0 in | Wt 169.4 lb

## 2019-03-22 DIAGNOSIS — I10 Essential (primary) hypertension: Secondary | ICD-10-CM | POA: Diagnosis not present

## 2019-03-22 DIAGNOSIS — M545 Low back pain: Secondary | ICD-10-CM | POA: Diagnosis not present

## 2019-03-22 DIAGNOSIS — G8929 Other chronic pain: Secondary | ICD-10-CM | POA: Diagnosis not present

## 2019-03-22 DIAGNOSIS — R350 Frequency of micturition: Secondary | ICD-10-CM

## 2019-03-22 DIAGNOSIS — R3915 Urgency of urination: Secondary | ICD-10-CM

## 2019-03-22 DIAGNOSIS — M5137 Other intervertebral disc degeneration, lumbosacral region: Secondary | ICD-10-CM

## 2019-03-22 DIAGNOSIS — N529 Male erectile dysfunction, unspecified: Secondary | ICD-10-CM

## 2019-03-22 DIAGNOSIS — Z79899 Other long term (current) drug therapy: Secondary | ICD-10-CM

## 2019-03-22 DIAGNOSIS — Z87442 Personal history of urinary calculi: Secondary | ICD-10-CM

## 2019-03-22 MED ORDER — SILDENAFIL CITRATE 50 MG PO TABS: 50.0000 mg | ORAL_TABLET | ORAL | 4 refills | Status: DC | PRN

## 2019-03-22 NOTE — Progress Notes (Signed)
   CC: Back pain  HPI:  [name] is a 69 y.o. male with a past medical history stated below and presents today for back pain. Please see problem based assessment and plan for additional details.    Past Medical History:  Diagnosis Date  . CEREBROVASCULAR ACCIDENT 10/17/2008   Acute CVA of right thalamus 10/03/08 ECHO performed 2/2 CVA 10/09/08, EF 55-65%  Aspirin 325 mg daily and try to work on quitting smoking.   . Emphysema    per multiple CXRs  . Hypertension    goal < 140/90  . Sickle cell trait ([place])   . Snoring    Sleep study 09/02/10 was WNL  . Stroke [facility]) 10/03/08   Right thalamus  . Tuberculosis at age 64-3    Review of Systems: Review of Systems  Constitutional: Negative for fever and malaise/fatigue.  Respiratory: Negative for cough and shortness of breath.   Cardiovascular: Negative for chest pain and leg swelling.  Gastrointestinal: Negative for abdominal pain.  Genitourinary: Positive for frequency and urgency. Negative for dysuria, flank pain and hematuria.  Musculoskeletal: Positive for back pain. Negative for falls and myalgias.  Neurological: Negative for dizziness, focal weakness and headaches.     Vitals:   03/22/19 1326  BP: (!) 143/106  Pulse: 78  Temp: 98.2 F (36.8 C)  TempSrc: Oral  SpO2: 100%  Weight: 169 lb 6.4 oz (76.8 kg)  Height: 6\' 1"  (1.854 m)     Physical Exam: Physical Exam  Constitutional: He is oriented to person, place, and time and well-developed, well-nourished, and in no distress.  HENT:  Head: Normocephalic and atraumatic.  Eyes: EOM are normal.  Cardiovascular: Normal rate, regular rhythm, normal heart sounds and intact distal pulses. Exam reveals no gallop and no friction rub.  No murmur heard. Pulmonary/Chest: Effort normal. No respiratory distress. He exhibits no tenderness.  Abdominal: Soft. He exhibits no distension. There is no abdominal tenderness.  Musculoskeletal:        General: No tenderness or  edema. Normal range of motion.     Cervical back: Normal range of motion.  Neurological: He is alert and oriented to person, place, and time.  Skin: Skin is warm and dry.     Assessment & Plan:   See Encounters Tab for problem based charting.  Patient discussed with Dr. Rebeca Alert

## 2019-03-22 NOTE — Assessment & Plan Note (Addendum)
Presents with a chronic history of low back pain.  He states that his normal back pain is roughly a 2/10 in the same area.  He states that roughly once a month he will have exacerbation of this pain.  During these episodes his pain is worse in the morning and feels like it is associated with holding his urine.  He denies any change in his back pain while voiding nor does he admit to dysuria, hematuria. His pain does not radiate and he has a negative straight leg test.  He states that movement will often exacerbate his low back pain that sometimes he is unable to bend over due to pain.  He states that he does not take any medication for this pain as he does not like to take medications if he does not need them.  He had imaging of his lower back in 2011 which showed degenerative disc disease at the lumbosacral junction.  Is likely that his pain is as a result of his chronic degenerative disc disease.  I counseled him on the use of NSAIDs and topical medications as needed for pain relief  Plan: -Conservative pain management with NSAIDs and Tylenol

## 2019-03-22 NOTE — Patient Instructions (Signed)
Thank you, [name] for allowing [place] to provide your care today. Today we discussed High Blood Pressure, Back Pain, Lower Urinary Tract Symptoms..    I have ordered BMP and urinalysis labs for you. I will call if any are abnormal.    I have place a referrals to none.   I have ordered the following tests: none   I have ordered the following medication/changed the following medications: none   Please follow-up in 2 weeks.    Should you have any questions or concerns please call the internal medicine clinic at [phone].    Marianna Payment, D.O. [facility]

## 2019-03-22 NOTE — Assessment & Plan Note (Addendum)
Patient's blood pressure elevated today at 153/108. States that he is taking amlodipine, HCTZ, and carvedilol. He states that he often does not take his HTN medications as be feels like it contributes to his impotence. Based on the TOM[facility] trial the patient is likely experiencing worsening of his ED as a result of his HCTZ. He may benefit from tritrating up his other antihypertensive agents and discontinuing his HCTZ in order to remove this barrier to HTN management.   Plan: - I want the patient to keep a blood pressure log for the next two weeks while taking all of his HTN medications and his recently prescribed Sildenafil. I counseled him regarding side effects and chance of low blood pressure.  - I will follow up with him in 2 weeks and adjust his medications as needed.

## 2019-03-23 LAB — BMP8+ANION GAP
Anion Gap: 13 mmol/L (ref 10.0–18.0)
BUN/Creatinine Ratio: 9 — ABNORMAL LOW (ref 10–24)
BUN: 9 mg/dL (ref 8–27)
CO2: 24 mmol/L (ref 20–29)
Calcium: 9.1 mg/dL (ref 8.6–10.2)
Chloride: 103 mmol/L (ref 96–106)
Creatinine, Ser: 0.95 mg/dL (ref 0.76–1.27)
GFR calc Af Amer: 93 mL/min/{1.73_m2} (ref >59)
GFR calc non Af Amer: 81 mL/min/{1.73_m2} (ref >59)
Glucose: 80 mg/dL (ref 65–99)
Potassium: 4.4 mmol/L (ref 3.5–5.2)
Sodium: 140 mmol/L (ref 134–144)

## 2019-03-23 LAB — URINALYSIS, ROUTINE W REFLEX MICROSCOPIC
Bilirubin, UA: NEGATIVE
Glucose, UA: NEGATIVE
Ketones, UA: NEGATIVE
Nitrite, UA: NEGATIVE
Protein,UA: NEGATIVE
RBC, UA: NEGATIVE
Specific Gravity, UA: 1.013 (ref 1.005–1.030)
Urobilinogen, Ur: 1 mg/dL (ref 0.2–1.0)
pH, UA: 6.5 (ref 5.0–7.5)

## 2019-03-23 LAB — MICROSCOPIC EXAMINATION
Bacteria, UA: NONE SEEN
Casts: NONE SEEN /lpf
RBC, Urine: NONE SEEN /hpf (ref 0–2)

## 2019-03-23 NOTE — Progress Notes (Signed)
Internal Medicine Clinic Attending  Case discussed with Dr. Coe at the time of the visit.  We reviewed the resident's history and exam and pertinent patient test results.  I agree with the assessment, diagnosis, and plan of care documented in the resident's note.  Sherrel Shafer, M.D., Ph.D.  

## 2019-03-23 NOTE — Assessment & Plan Note (Signed)
Plan: - Sildenafil 50 mg as need for erectile dysfunction.

## 2019-03-23 NOTE — Assessment & Plan Note (Addendum)
Patient states that he has urinary frequency and urgency associated with low back pain. He also admits to frequent stopping and starting. He had a kidney stone when he was younger, and is concerned that he has another one. I showed him the imaging from 2011 showing degenerative disk disease of the lumbosacral junction and stated that his pain is likely coming from his degenerative disk disease. His LUTS symptoms are concerning for BPH, although his PSA a couple years ago was normal.   Plan: - UA with reflex microscopy - negative for UTI, crystals or hematuria.  - Apparently has been on flowmax in the past for these urinary issues. Not sure if he is taking it now. - Follow up in 2 weeks. If he is still having LUTS symptoms then he will need a DRE and Abdomen and pelvis US.

## 2019-04-04 ENCOUNTER — Other Ambulatory Visit: Payer: Self-pay | Admitting: Internal Medicine

## 2019-04-04 NOTE — Telephone Encounter (Signed)
Refill request  hydrochlorothiazide (HYDRODIURIL) 25 MG tablet   [facility] [place], [place] - [phone]

## 2019-04-05 ENCOUNTER — Ambulatory Visit (INDEPENDENT_AMBULATORY_CARE_PROVIDER_SITE_OTHER): Payer: 59 | Admitting: Internal Medicine

## 2019-04-05 ENCOUNTER — Other Ambulatory Visit: Payer: Self-pay

## 2019-04-05 ENCOUNTER — Encounter: Payer: Self-pay | Admitting: Internal Medicine

## 2019-04-05 VITALS — BP 152/93 | HR 81 | Temp 98.4°F | Ht 73.0 in | Wt 172.9 lb

## 2019-04-05 DIAGNOSIS — Z8673 Personal history of transient ischemic attack (TIA), and cerebral infarction without residual deficits: Secondary | ICD-10-CM | POA: Diagnosis not present

## 2019-04-05 DIAGNOSIS — R350 Frequency of micturition: Secondary | ICD-10-CM

## 2019-04-05 DIAGNOSIS — E785 Hyperlipidemia, unspecified: Secondary | ICD-10-CM

## 2019-04-05 DIAGNOSIS — I10 Essential (primary) hypertension: Secondary | ICD-10-CM

## 2019-04-05 DIAGNOSIS — Z87448 Personal history of other diseases of urinary system: Secondary | ICD-10-CM | POA: Diagnosis not present

## 2019-04-05 DIAGNOSIS — E7849 Other hyperlipidemia: Secondary | ICD-10-CM

## 2019-04-05 DIAGNOSIS — Z79899 Other long term (current) drug therapy: Secondary | ICD-10-CM

## 2019-04-05 MED ORDER — HYDROCHLOROTHIAZIDE 25 MG PO TABS: 25.0000 mg | ORAL_TABLET | Freq: Every day | ORAL | 3 refills | Status: DC

## 2019-04-05 MED ORDER — AMLODIPINE BESYLATE 10 MG PO TABS: 10.0000 mg | ORAL_TABLET | Freq: Every day | ORAL | 3 refills | Status: DC

## 2019-04-05 MED ORDER — CARVEDILOL 3.125 MG PO TABS: 3.1250 mg | ORAL_TABLET | Freq: Two times a day (BID) | ORAL | 3 refills | Status: DC

## 2019-04-05 MED ORDER — ATORVASTATIN CALCIUM 80 MG PO TABS: 80.0000 mg | ORAL_TABLET | Freq: Every day | ORAL | 3 refills | Status: DC

## 2019-04-05 NOTE — Assessment & Plan Note (Signed)
Patient recently assessed on 1/14 for urinary frequency. His symptoms have resolved. He has changed his diet and started to drink more water. This is significantly improved.

## 2019-04-05 NOTE — Progress Notes (Signed)
   CC: HTN, HLD, Urinary urgency, back pain  HPI:  [name] is a 69 y.o. male with PMHx listed below presenting for HTN, HLD, Urinary urgency, back pain. Please see the A&P for the status of the patient's chronic medical problems.  Past Medical History:  Diagnosis Date  . CEREBROVASCULAR ACCIDENT 10/17/2008   Acute CVA of right thalamus 10/03/08 ECHO performed 2/2 CVA 10/09/08, EF 55-65%  Aspirin 325 mg daily and try to work on quitting smoking.   . Emphysema    per multiple CXRs  . Hypertension    goal < 140/90  . Sickle cell trait ([name])   . Snoring    Sleep study 09/02/10 was WNL  . Stroke [facility]) 10/03/08   Right thalamus  . Tuberculosis at age 74-3   Review of Systems:  Performed and all others negative.  Physical Exam: Vitals:   04/05/19 1315  BP: (!) 152/93  Pulse: 81  Temp: 98.4 F (36.9 C)  TempSrc: Oral  SpO2: 100%  Weight: 172 lb 14.4 oz (78.4 kg)   General: Well nourished male in no acute distress Pulm: Good air movement with no wheezing or crackles  CV: RRR, no murmurs, no rubs   Assessment & Plan:   See Encounters Tab for problem based charting.  Patient discussed with Dr. Dareen Piano

## 2019-04-05 NOTE — Patient Instructions (Signed)
Thank you for allowing [place] to provide your care. I have sent out all your medications. I'm glad things are moving in the right direction. Please come back to see [place] in three months or sooner if any issues arise.

## 2019-04-05 NOTE — Assessment & Plan Note (Signed)
Patient needs a refill on his atorvastatin 80 mg daily. He is on this due to prior CVA. He is tolerating it well. Refill sent.

## 2019-04-05 NOTE — Assessment & Plan Note (Signed)
Patient needs a refill on all of his current medications. He is prescribed amlodipine 10 mg daily, carvedilol 3.125 mg BID, and hydrochlorothiazide 25 mg daily. When he takes his medications he tolerates them well. He states that he does not like to take them so he is not requested a refill in several weeks. He understands the importance of treating hypertension and why he should take his medication. He is willing to restart them.  A/P: - Refill amlodipine 10 daily, carvedilol 3.125 mg BID, and hydrochlorothiazide 25 mg daily.

## 2019-04-06 NOTE — Progress Notes (Signed)
Internal Medicine Clinic Attending  Case discussed with Dr. Helberg at the time of the visit.  We reviewed the resident's history and exam and pertinent patient test results.  I agree with the assessment, diagnosis, and plan of care documented in the resident's note.    

## 2019-04-11 ENCOUNTER — Telehealth: Payer: Self-pay | Admitting: Internal Medicine

## 2019-04-11 ENCOUNTER — Ambulatory Visit (INDEPENDENT_AMBULATORY_CARE_PROVIDER_SITE_OTHER): Payer: 59 | Admitting: *Deleted

## 2019-04-11 DIAGNOSIS — Z950 Presence of cardiac pacemaker: Secondary | ICD-10-CM

## 2019-04-11 LAB — CUP PACEART REMOTE DEVICE CHECK
Battery Impedance: 1368 Ohm
Battery Remaining Longevity: 54 mo
Battery Voltage: 2.77 V
Brady Statistic AP VP Percent: 0 %
Brady Statistic AP VS Percent: 35 %
Brady Statistic AS VP Percent: 0 %
Brady Statistic AS VS Percent: 64 %
Date Time Interrogation Session: 20210202144730
Implantable Lead Implant Date: 20101105
Implantable Lead Implant Date: 20101105
Implantable Lead Location: 753859
Implantable Lead Location: 753860
Implantable Lead Model: 5076
Implantable Lead Model: 5076
Implantable Pulse Generator Implant Date: 20101105
Lead Channel Impedance Value: 481 Ohm
Lead Channel Impedance Value: 562 Ohm
Lead Channel Pacing Threshold Amplitude: 0.625 V
Lead Channel Pacing Threshold Amplitude: 0.75 V
Lead Channel Pacing Threshold Pulse Width: 0.4 ms
Lead Channel Pacing Threshold Pulse Width: 0.4 ms
Lead Channel Setting Pacing Amplitude: 2 V
Lead Channel Setting Pacing Amplitude: 2.5 V
Lead Channel Setting Pacing Pulse Width: 0.4 ms
Lead Channel Setting Sensing Sensitivity: 4 mV

## 2019-04-11 NOTE — Telephone Encounter (Signed)
Spoke with pt, he got a new phone, the medtronic carelink app did not transfer.  Provided pt with instructions for downloading app to his phone, he will callback if unable to locate app.

## 2019-04-11 NOTE — Telephone Encounter (Signed)
New message   Patient has questions about an app for his device. Please call.

## 2019-04-12 NOTE — Progress Notes (Signed)
PPM Remote  

## 2019-05-04 ENCOUNTER — Encounter: Payer: Self-pay | Admitting: *Deleted

## 2019-05-04 NOTE — Progress Notes (Signed)

## 2019-05-08 NOTE — Progress Notes (Signed)
Things That May Be Affecting Your Health:  Alcohol  Hearing loss  Pain    Depression  Home Safety  Sexual Health   Diabetes x Lack of physical activity  Stress   Difficulty with daily activities  Loneliness  Tiredness   Drug use  Medicines x Tobacco use   Falls  Motor Vehicle Safety  Weight   Food choices x Oral Health  Other    YOUR PERSONALIZED HEALTH PLAN : 1. Schedule your next subsequent Medicare Wellness visit in one year 2. Attend all of your regular appointments to address your medical issues 3. Complete the preventative screenings and services   Annual Wellness Visit   Medicare Covered Preventative Screenings and Capac Men and Women Who How Often Need? Date of Last Service Action  Abdominal Aortic Aneurysm Adults with AAA risk factors Once X  Will place order if patient interested  Alcohol Misuse and Counseling All Adults Screening once a year if no alcohol misuse. Counseling up to 4 face to face sessions.     Bone Density Measurement  Adults at risk for osteoporosis Once every 2 yrs     Lipid Panel Z13.6 All adults without CV disease Once every 5 yrs     Colorectal Cancer   Stool sample or  Colonoscopy All adults 14 and older   Once every year  Every 10 years  06/18/11   Depression All Adults Once a year  Today   Diabetes Screening Blood glucose, post glucose load, or GTT Z13.1  All adults at risk  Pre-diabetics  Once per year  Twice per year  03/22/19   Diabetes  Self-Management Training All adults Diabetics 10 hrs first year; 2 hours subsequent years. Requires Copay     Glaucoma  Diabetics  Family history of glaucoma  African Americans 66 yrs +  Hispanic Americans 12 yrs + Annually - requires coppay     Hepatitis C Z72.89 or F19.20  High Risk for HCV  Born between 1945 and 1965  Annually  Once  04/23/16   HIV Z11.4 All adults based on risk  Annually btw ages 29 & 52 regardless of risk  Annually > 65 yrs if at  increased risk     Lung Cancer Screening Asymptomatic adults aged 4-77 with 30 pack yr history and current smoker OR quit within the last 15 yrs Annually Must have counseling and shared decision making documentation before first screen x 01/05/17 Place order for low dose CT if still interested  Medical Nutrition Therapy Adults with   Diabetes  Renal disease  Kidney transplant within past 3 yrs 3 hours first year; 2 hours subsequent years     Obesity and Counseling All adults Screening once a year Counseling if BMI 30 or higher  Today   Tobacco Use Counseling Adults who use tobacco  Up to 8 visits in one year x    Vaccines Z23  Hepatitis B  Influenza   Pneumonia  Adults   Once  Once every flu season  Two different vaccines separated by one year   Uptodate  Next Annual Wellness Visit People with Medicare Every year  Today     Services & Screenings Women Who How Often Need  Date of Last Service Action  Mammogram  Z12.31 Women over 20 One baseline ages 74-39. Annually ager 40 yrs+     Pap tests All women Annually if high risk. Every 2 yrs for normal risk women     Screening  for cervical cancer with   Pap (Z01.419 nl or Z01.411abnl) &  HPV Z11.51 Women aged 37 to 77 Once every 5 yrs     Screening pelvic and breast exams All women Annually if high risk. Every 2 yrs for normal risk women     Sexually Transmitted Diseases  Chlamydia  Gonorrhea  Syphilis All at risk adults Annually for non pregnant females at increased risk         [place] Men Who How Ofter Need  Date of Last Service Action  Prostate Cancer - DRE & PSA Men over 50 Annually.  DRE might require a copay. ? 11/26/16 Last PSA 2018, may obtain repeat.  Sexually Transmitted Diseases  Syphilis All at risk adults Annually for men at increased risk

## 2019-05-24 ENCOUNTER — Other Ambulatory Visit: Payer: Self-pay | Admitting: Internal Medicine

## 2019-05-24 MED ORDER — ALBUTEROL SULFATE HFA 108 (90 BASE) MCG/ACT IN AERS: 2.0000 | INHALATION_SPRAY | Freq: Four times a day (QID) | RESPIRATORY_TRACT | 5 refills | Status: DC | PRN

## 2019-05-24 NOTE — Telephone Encounter (Signed)
Need refill on  albuterol (PROVENTIL HFA;VENTOLIN HFA) 108 (90 Base) MCG/ACT inhaler ;pt contact [place], [place] [address]

## 2019-06-29 ENCOUNTER — Ambulatory Visit (INDEPENDENT_AMBULATORY_CARE_PROVIDER_SITE_OTHER): Payer: Medicare Other | Admitting: Internal Medicine

## 2019-06-29 ENCOUNTER — Encounter: Payer: Self-pay | Admitting: Internal Medicine

## 2019-06-29 ENCOUNTER — Other Ambulatory Visit: Payer: Self-pay

## 2019-06-29 VITALS — Ht 73.25 in | Wt 172.0 lb

## 2019-06-29 DIAGNOSIS — F4321 Adjustment disorder with depressed mood: Secondary | ICD-10-CM

## 2019-06-29 DIAGNOSIS — Z720 Tobacco use: Secondary | ICD-10-CM

## 2019-06-29 DIAGNOSIS — Z634 Disappearance and death of family member: Secondary | ICD-10-CM | POA: Diagnosis not present

## 2019-06-29 DIAGNOSIS — Z0000 Encounter for general adult medical examination without abnormal findings: Secondary | ICD-10-CM

## 2019-06-29 NOTE — Patient Instructions (Addendum)
Annual Wellness Visit   Medicare Covered Preventative Screenings and Services  Services & Screenings Men and Women Who How Often Need? Date of Last Service Action  Abdominal Aortic Aneurysm Adults with AAA risk factors Once Yes  Dr. Heber Dunn Loring placed order  Alcohol Misuse and Counseling All Adults Screening once a year if no alcohol misuse. Counseling up to 4 face to face sessions.     Bone Density Measurement  Adults at risk for osteopo+-rosis Once every 2 yrs     Lipid Panel Z13.6 All adults without CV disease Once every 5 yrs     Colorectal Cancer   Stool sample or  Colonoscopy All adults 31 and older   Once every year  Every 10 years  06/18/2011   Depression All Adults Once a year  Today   Diabetes Screening Blood glucose, post glucose load, or GTT Z13.1  All adults at risk  Pre-diabetics  Once per year  Twice per year  03/22/2019   Diabetes  Self-Management Training All adults Diabetics 10 hrs first year; 2 hours subsequent years. Requires Copay     Glaucoma  Diabetics  Family history of glaucoma  African Americans 52 yrs +  Hispanic Americans 66 yrs + Annually - requires coppay     Hepatitis C Z72.89 or F19.20  High Risk for HCV  Born between 1945 and 1965  Annually  Once  04/23/2016   HIV Z11.4 All adults based on risk  Annually btw ages 6 & 75 regardless of risk  Annually > 65 yrs if at increased risk     Lung Cancer Screening Asymptomatic adults aged 58-77 with 30 pack yr history and current smoker OR quit within the last 15 yrs Annually Must have counseling and shared decision making documentation before first screen   Dr. Heber [place]ia placed order  Medical Nutrition Therapy Adults with   Diabetes  Renal disease  Kidney transplant within past 3 yrs 3 hours first year; 2 hours subsequent years     Obesity and Counseling All adults Screening once a year Counseling if BMI 30 or higher  Today   Tobacco Use Counseling Adults who use tobacco  Up to 8 visits in  one year Yes  Call 1-800-Quit-Now for help with quitting smoking  Vaccines Z23  Hepatitis B  Influenza   Pneumonia  Adults   Once  Once every flu season  Two different vaccines separated by one year   Flu vaccine yearly starting September 1  Covid vaccine after speaking with Dr. Heber [place]  Next Annual Wellness Visit People with Medicare Every year  Today     Services & Screenings Women Who How Often Need  Date of Last Service Action  Mammogram  Z12.31 Women over 85 One baseline ages 27-39. Annually ager 40 yrs+     Pap tests All women Annually if high risk. Every 2 yrs for normal risk women     Screening for cervical cancer with   Pap (Z01.419 nl or Z01.411abnl) &  HPV Z11.51 Women aged 52 to 69 Once every 5 yrs     Screening pelvic and breast exams All women Annually if high risk. Every 2 yrs for normal risk women     Sexually Transmitted Diseases  Chlamydia  Gonorrhea  Syphilis All at risk adults Annually for non pregnant females at increased risk         [place] Men Who How Ofter Need  Date of Last Service Action  Prostate Cancer - DRE &  PSA Men over 50 Annually.  DRE might require a copay.     Sexually Transmitted Diseases  Syphilis All at risk adults Annually for men at increased risk         Things That May Be Affecting Your Health:  Alcohol  Hearing loss  Pain    Depression  Home Safety  Sexual Health   Diabetes  Lack of physical activity  Stress   Difficulty with daily activities  Loneliness  Tiredness   Drug use  Medicines X Tobacco use   Falls  Motor Vehicle Safety  Weight   Food choices  Oral Health  Other    YOUR PERSONALIZED HEALTH PLAN : 1. Schedule your next subsequent Medicare Wellness visit in one year 2. Attend all of your regular appointments to address your medical issues 3. Complete the preventative screenings and services 4. A Referral has been placed to D[facility] for [facility]  5. Dr. Heber Calera has placed  orders for screenings for Abdominal Aortic Aneurysm and Lung Cancer.  6. Dr. Heber Grover will call you regarding the Covid Vaccine.  7. We have scheduled the first available f/u appt with Dr. Heber [place] for 08/10/2019 8. Begin seated and standing exercises with exercise band.    Fall Prevention in the Home, Adult Falls can cause injuries. They can happen to people of all ages. There are many things you can do to make your home safe and to help prevent falls. Ask for help when making these changes, if needed. What actions can I take to prevent falls? General Instructions  Use good lighting in all rooms. Replace any light bulbs that burn out.  Turn on the lights when you go into a dark area. Use night-lights.  Keep items that you use often in easy-to-reach places. Lower the shelves around your home if necessary.  Set up your furniture so you have a clear path. Avoid moving your furniture around.  Do not have throw rugs and other things on the floor that can make you trip.  Avoid walking on wet floors.  If any of your floors are uneven, fix them.  Add color or contrast paint or tape to clearly mark and help you see: ? Any grab bars or handrails. ? First and last steps of stairways. ? Where the edge of each step is.  If you use a stepladder: ? Make sure that it is fully opened. Do not climb a closed stepladder. ? Make sure that both sides of the stepladder are locked into place. ? Ask someone to hold the stepladder for you while you use it.  If there are any pets around you, be aware of where they are. What can I do in the bathroom?      Keep the floor dry. Clean up any water that spills onto the floor as soon as it happens.  Remove soap buildup in the tub or shower regularly.  Use non-skid mats or decals on the floor of the tub or shower.  Attach bath mats securely with double-sided, non-slip rug tape.  If you need to sit down in the shower, use a plastic, non-slip  stool.  Install grab bars by the toilet and in the tub and shower. Do not use towel bars as grab bars. What can I do in the bedroom?  Make sure that you have a light by your bed that is easy to reach.  Do not use any sheets or blankets that are too big for your bed. They should  not hang down onto the floor.  Have a firm chair that has side arms. You can use this for support while you get dressed. What can I do in the kitchen?  Clean up any spills right away.  If you need to reach something above you, use a strong step stool that has a grab bar.  Keep electrical cords out of the way.  Do not use floor polish or wax that makes floors slippery. If you must use wax, use non-skid floor wax. What can I do with my stairs?  Do not leave any items on the stairs.  Make sure that you have a light switch at the top of the stairs and the bottom of the stairs. If you do not have them, ask someone to add them for you.  Make sure that there are handrails on both sides of the stairs, and use them. Fix handrails that are broken or loose. Make sure that handrails are as long as the stairways.  Install non-slip stair treads on all stairs in your home.  Avoid having throw rugs at the top or bottom of the stairs. If you do have throw rugs, attach them to the floor with carpet tape.  Choose a carpet that does not hide the edge of the steps on the stairway.  Check any carpeting to make sure that it is firmly attached to the stairs. Fix any carpet that is loose or worn. What can I do on the outside of my home?  Use bright outdoor lighting.  Regularly fix the edges of walkways and driveways and fix any cracks.  Remove anything that might make you trip as you walk through a door, such as a raised step or threshold.  Trim any bushes or trees on the path to your home.  Regularly check to see if handrails are loose or broken. Make sure that both sides of any steps have handrails.  Install guardrails  along the edges of any raised decks and porches.  Clear walking paths of anything that might make someone trip, such as tools or rocks.  Have any leaves, snow, or ice cleared regularly.  Use sand or salt on walking paths during winter.  Clean up any spills in your garage right away. This includes grease or oil spills. What other actions can I take?  Wear shoes that: ? Have a low heel. Do not wear high heels. ? Have rubber bottoms. ? Are comfortable and fit you well. ? Are closed at the toe. Do not wear open-toe sandals.  Use tools that help you move around (mobility aids) if they are needed. These include: ? Canes. ? Walkers. ? Scooters. ? Crutches.  Review your medicines with your doctor. Some medicines can make you feel dizzy. This can increase your chance of falling. Ask your doctor what other things you can do to help prevent falls. Where to find more information  Centers for Disease Control and Prevention, STEADI: https://garcia.biz/  [employer] on Aging: BrainJudge.co.uk Contact a doctor if:  You are afraid of falling at home.  You feel weak, drowsy, or dizzy at home.  You fall at home. Summary  There are many simple things that you can do to make your home safe and to help prevent falls.  Ways to make your home safe include removing tripping hazards and installing grab bars in the bathroom.  Ask for help when making these changes in your home. This information is not intended to replace advice given to you  by your health care provider. Make sure you discuss any questions you have with your health care provider. Document Revised: 06/16/2018 Document Reviewed: 10/08/2016 Elsevier Patient Education  2020 [place] Maintenance, Male Adopting a healthy lifestyle and getting preventive care are important in promoting health and wellness. Ask your health care provider about:  The right schedule for you to have regular tests and  exams.  Things you can do on your own to prevent diseases and keep yourself healthy. What should I know about diet, weight, and exercise? Eat a healthy diet   Eat a diet that includes plenty of vegetables, fruits, low-fat dairy products, and lean protein.  Do not eat a lot of foods that are high in solid fats, added sugars, or sodium. Maintain a healthy weight Body mass index (BMI) is a measurement that can be used to identify possible weight problems. It estimates body fat based on height and weight. Your health care provider can help determine your BMI and help you achieve or maintain a healthy weight. Get regular exercise Get regular exercise. This is one of the most important things you can do for your health. Most adults should:  Exercise for at least 150 minutes each week. The exercise should increase your heart rate and make you sweat (moderate-intensity exercise).  Do strengthening exercises at least twice a week. This is in addition to the moderate-intensity exercise.  Spend less time sitting. Even light physical activity can be beneficial. Watch cholesterol and blood lipids Have your blood tested for lipids and cholesterol at 71 years of age, then have this test every 5 years. You may need to have your cholesterol levels checked more often if:  Your lipid or cholesterol levels are high.  You are older than 71 years of age.  You are at high risk for heart disease. What should I know about cancer screening? Many types of cancers can be detected early and may often be prevented. Depending on your health history and family history, you may need to have cancer screening at various ages. This may include screening for:  Colorectal cancer.  Prostate cancer.  Skin cancer.  Lung cancer. What should I know about heart disease, diabetes, and high blood pressure? Blood pressure and heart disease  High blood pressure causes heart disease and increases the risk of stroke. This  is more likely to develop in people who have high blood pressure readings, are of African descent, or are overweight.  Talk with your health care provider about your target blood pressure readings.  Have your blood pressure checked: ? Every 3-5 years if you are 4-78 years of age. ? Every year if you are 68 years old or older.  If you are between the ages of 4 and 65 and are a current or former smoker, ask your health care provider if you should have a one-time screening for abdominal aortic aneurysm (AAA). Diabetes Have regular diabetes screenings. This checks your fasting blood sugar level. Have the screening done:  Once every three years after age 33 if you are at a normal weight and have a low risk for diabetes.  More often and at a younger age if you are overweight or have a high risk for diabetes. What should I know about preventing infection? Hepatitis B If you have a higher risk for hepatitis B, you should be screened for this virus. Talk with your health care provider to find out if you are at risk for hepatitis B infection.  Hepatitis C Blood testing is recommended for:  Everyone born from [birth year] through 1967.  Anyone with known risk factors for hepatitis C. Sexually transmitted infections (STIs)  You should be screened each year for STIs, including gonorrhea and chlamydia, if: ? You are sexually active and are younger than 71 years of age. ? You are older than 71 years of age and your health care provider tells you that you are at risk for this type of infection. ? Your sexual activity has changed since you were last screened, and you are at increased risk for chlamydia or gonorrhea. Ask your health care provider if you are at risk.  Ask your health care provider about whether you are at high risk for HIV. Your health care provider may recommend a prescription medicine to help prevent HIV infection. If you choose to take medicine to prevent HIV, you should first get tested for  HIV. You should then be tested every 3 months for as long as you are taking the medicine. Follow these instructions at home: Lifestyle  Do not use any products that contain nicotine or tobacco, such as cigarettes, e-cigarettes, and chewing tobacco. If you need help quitting, ask your health care provider.  Do not use street drugs.  Do not share needles.  Ask your health care provider for help if you need support or information about quitting drugs. Alcohol use  Do not drink alcohol if your health care provider tells you not to drink.  If you drink alcohol: ? Limit how much you have to 0-2 drinks a day. ? Be aware of how much alcohol is in your drink. In the [place] one drink equals one 12 oz bottle of beer (355 mL), one 5 oz glass of wine (148 mL), or one 1 oz glass of hard liquor (44 mL). General instructions  Schedule regular health, dental, and eye exams.  Stay current with your vaccines.  Tell your health care provider if: ? You often feel depressed. ? You have ever been abused or do not feel safe at home. Summary  Adopting a healthy lifestyle and getting preventive care are important in promoting health and wellness.  Follow your health care provider's instructions about healthy diet, exercising, and getting tested or screened for diseases.  Follow your health care provider's instructions on monitoring your cholesterol and blood pressure. This information is not intended to replace advice given to you by your health care provider. Make sure you discuss any questions you have with your health care provider. Document Revised: 02/16/2018 Document Reviewed: 02/16/2018 Elsevier Patient Education  2020 [employer].   Steps to Quit Smoking Smoking tobacco is the leading cause of preventable death. It can affect almost every organ in the body. Smoking puts you and people around you at risk for many serious, long-lasting (chronic) diseases. Quitting smoking can be hard, but it is  one of the best things that you can do for your health. It is never too late to quit. How do I get ready to quit? When you decide to quit smoking, make a plan to help you succeed. Before you quit:  Pick a date to quit. Set a date within the next 2 weeks to give you time to prepare.  Write down the reasons why you are quitting. Keep this list in places where you will see it often.  Tell your family, friends, and co-workers that you are quitting. Their support is important.  Talk with your doctor about the choices that may help  you quit.  Find out if your health insurance will pay for these treatments.  Know the people, places, things, and activities that make you want to smoke (triggers). Avoid them. What first steps can I take to quit smoking?  Throw away all cigarettes at home, at work, and in your car.  Throw away the things that you use when you smoke, such as ashtrays and lighters.  Clean your car. Make sure to empty the ashtray.  Clean your home, including curtains and carpets. What can I do to help me quit smoking? Talk with your doctor about taking medicines and seeing a counselor at the same time. You are more likely to succeed when you do both.  If you are pregnant or breastfeeding, talk with your doctor about counseling or other ways to quit smoking. Do not take medicine to help you quit smoking unless your doctor tells you to do so. To quit smoking: Quit right away  Quit smoking totally, instead of slowly cutting back on how much you smoke over a period of time.  Go to counseling. You are more likely to quit if you go to counseling sessions regularly. Take medicine You may take medicines to help you quit. Some medicines need a prescription, and some you can buy over-the-counter. Some medicines may contain a drug called nicotine to replace the nicotine in cigarettes. Medicines may:  Help you to stop having the desire to smoke (cravings).  Help to stop the problems  that come when you stop smoking (withdrawal symptoms). Your doctor may ask you to use:  Nicotine patches, gum, or lozenges.  Nicotine inhalers or sprays.  Non-nicotine medicine that is taken by mouth. Find resources Find resources and other ways to help you quit smoking and remain smoke-free after you quit. These resources are most helpful when you use them often. They include:  Online chats with a Social worker.  Phone quitlines.  Printed Furniture conservator/restorer.  Support groups or group counseling.  Text messaging programs.  Mobile phone apps. Use apps on your mobile phone or tablet that can help you stick to your quit plan. There are many free apps for mobile phones and tablets as well as websites. Examples include Quit Guide from the [employer] and smokefree.gov  What things can I do to make it easier to quit?   Talk to your family and friends. Ask them to support and encourage you.  Call a phone quitline (1-800-QUIT-NOW), reach out to support groups, or work with a Social worker.  Ask people who smoke to not smoke around you.  Avoid places that make you want to smoke, such as: ? Bars. ? Parties. ? Smoke-break areas at work.  Spend time with people who do not smoke.  Lower the stress in your life. Stress can make you want to smoke. Try these things to help your stress: ? Getting regular exercise. ? Doing deep-breathing exercises. ? Doing yoga. ? Meditating. ? Doing a body scan. To do this, close your eyes, focus on one area of your body at a time from head to toe. Notice which parts of your body are tense. Try to relax the muscles in those areas. How will I feel when I quit smoking? Day 1 to 3 weeks Within the first 24 hours, you may start to have some problems that come from quitting tobacco. These problems are very bad 2-3 days after you quit, but they do not often last for more than 2-3 weeks. You may get these symptoms:  Mood  swings.  Feeling restless, nervous, angry, or  annoyed.  Trouble concentrating.  Dizziness.  Strong desire for high-sugar foods and nicotine.  Weight gain.  Trouble pooping (constipation).  Feeling like you may vomit (nausea).  Coughing or a sore throat.  Changes in how the medicines that you take for other issues work in your body.  Depression.  Trouble sleeping (insomnia). Week 3 and afterward After the first 2-3 weeks of quitting, you may start to notice more positive results, such as:  Better sense of smell and taste.  Less coughing and sore throat.  Slower heart rate.  Lower blood pressure.  Clearer skin.  Better breathing.  Fewer sick days. Quitting smoking can be hard. Do not give up if you fail the first time. Some people need to try a few times before they succeed. Do your best to stick to your quit plan, and talk with your doctor if you have any questions or concerns. Summary  Smoking tobacco is the leading cause of preventable death. Quitting smoking can be hard, but it is one of the best things that you can do for your health.  When you decide to quit smoking, make a plan to help you succeed.  Quit smoking right away, not slowly over a period of time.  When you start quitting, seek help from your doctor, family, or friends. This information is not intended to replace advice given to you by your health care provider. Make sure you discuss any questions you have with your health care provider. Document Revised: 11/18/2018 Document Reviewed: 05/14/2018 Elsevier Patient Education  [place].

## 2019-06-29 NOTE — Progress Notes (Signed)
This AWV is being conducted by [place] only. The patient was located at home and I was located in [facility]. The patient's identity was confirmed using their DOB and current address. The patient or his/her legal guardian has consented to being evaluated through a telephone encounter and understands the associated risks (an examination cannot be done and the patient may need to come in for an appointment) / benefits (allows the patient to remain at home, decreasing exposure to coronavirus). I personally spent 51 minutes conducting the AWV.  Subjective:   [name] is a 69 y.o. male who presents for a Medicare Annual Wellness Visit.  The following items have been reviewed and updated today in the appropriate area in the EMR.   Health Risk Assessment  Height, weight, BMI, and BP Visual acuity if needed Depression screen Fall risk / safety level Advance directive discussion Medical and family history were reviewed and updated Updating list of other providers & suppliers Medication reconciliation, including over the counter medicines Cognitive screen Written screening schedule Risk Factor list Personalized health advice, risky behaviors, and treatment advice  Social History   Social History Narrative   Current Social History 06/29/2019        Patient lives with spouse in a ground floor apartment which is 1 story. There are not steps up to the entrance the patient uses.       Patient's method of transportation is personal car.      The highest level of education was Bachelor's Degree; now working on [employer].      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      Identified important Relationships are "My wife"      Pets : None       Interests / Fun: Computers, "I'm an Optician, dispensing."       Current Stressors: "I hardly let anything bother me. I walk away"       Religious / Personal Beliefs:  [employer]       L. Romario Tith, BSN, RN-BC            Objective:    Vitals: Ht 6' 1.25" (1.861 m)   Wt 172 lb (78 kg)   BMI 22.54 kg/m  Vitals are unable to obtained due to [id number] public health emergency  Activities of Daily Living In your present state of health, do you have any difficulty performing the following activities: 06/29/2019 04/05/2019  Hearing? N N  Vision? Y N  Comment Bilat cataracts -  Difficulty concentrating or making decisions? Y N  Walking or climbing stairs? N N  Dressing or bathing? N N  Doing errands, shopping? N N  Some recent data might be hidden   Goals    . Blood Pressure < 140/90    . Continue riding bike [place] twice weekly x 25-30 minutes (pt-stated)    . Quit Smoking     Information provided on [place] Quitline and smoking cessation classes at [facility]        Fall Risk Fall Risk  06/29/2019 04/05/2019 03/22/2019 12/19/2018 08/18/2018  Falls in the past year? 0 0 0 0 0  Number falls in past yr: - - - 0 -  Injury with Fall? - - - 0 -  Risk for fall due to : No Fall Risks - - - History of fall(s)  Risk for fall due to: Comment - - - - -  Follow up Education provided;Falls prevention discussed Falls prevention discussed - - -   CDC Handout on Fall Prevention and Handout on Home Exercise Program, Access codes [id number] and [id number] given/mailed to patient with exercise band.   Depression Screen PHQ 2/9 Scores 06/29/2019 04/05/2019 03/22/2019 08/10/2018  PHQ - 2 Score 0 0 0 0  PHQ- 9 Score 8 - - -  Exception Documentation - - - -   Patient interested in in-person IBH. Referral placed to [facility].  Cognitive Testing Six-Item Cognitive Screener   "I would like to ask you some questions that ask you to use your memory. I am going to name three objects. Please wait until I say all three words, then repeat them. Remember what they are  because I am going to ask you to name them again in a few minutes. Please repeat these words for me:  APPLE--TABLE--PENNY." (Interviewer may repeat names 3 times if necessary but repetition not scored.)  Did patient correctly repeat all three words? Yes - may proceed with screen  What year is this? Correct What month is this? Correct What day of the week is this? Correct  What were the three objects I asked you to remember? . Apple Correct . Table Correct . Penny Correct  Score one point for each incorrect answer.  A score of 2 or more points warrants additional investigation.  Patient's score 0    Assessment and Plan:     Patient grieving from recent death of daughter. Interested in in-person IBH. Referral placed to [facility]  Patient interested in screenings for AAA and Lung Cancer. PCP has placed orders.  Patient would like to speak with PCP prior to receiving Covid Vaccine. PCP will call patient.  First available f/u appt made with PCP for 08/10/2019  During the course of the visit the patient was educated and counseled about appropriate screening and preventive services as documented in the assessment and plan.  The printed AVS was given to the patient and included an updated screening schedule, a list of risk factors, and personalized health advice.        Velora Heckler, RN  06/29/2019

## 2019-06-30 NOTE — Progress Notes (Signed)
I discussed the AWV findings with the RN who conducted the visit. I was present in the office suite and immediately available to provide assistance and direction throughout the time the service was provided.   

## 2019-07-02 NOTE — Progress Notes (Signed)
Internal Medicine Clinic Attending  Case discussed with Dr. Ronnald Ramp at the time of the visit.  We reviewed the AWV findings.  I agree with the assessment, diagnosis, and plan of care documented in the AWV note.

## 2019-07-12 ENCOUNTER — Telehealth: Payer: Self-pay | Admitting: Licensed Clinical Social Worker

## 2019-07-12 ENCOUNTER — Encounter: Payer: Self-pay | Admitting: Licensed Clinical Social Worker

## 2019-07-12 ENCOUNTER — Telehealth: Payer: Self-pay

## 2019-07-12 NOTE — Telephone Encounter (Signed)
Patient was called to discuss the referral for services. Patient did not answer, and the vm box was full. Unable to leave a message. A letter will be mailed today.

## 2019-07-12 NOTE — Telephone Encounter (Signed)
Unable to speak  with patient to remind of missed remote transmission 

## 2019-07-13 ENCOUNTER — Telehealth: Payer: Self-pay

## 2019-07-13 NOTE — Telephone Encounter (Signed)
LVM regarding patient missed transmission.

## 2019-07-18 ENCOUNTER — Inpatient Hospital Stay: Admission: RE | Admit: 2019-07-18 | Payer: Medicare Other | Source: Ambulatory Visit

## 2019-07-25 ENCOUNTER — Ambulatory Visit (INDEPENDENT_AMBULATORY_CARE_PROVIDER_SITE_OTHER): Payer: Medicare Other | Admitting: *Deleted

## 2019-07-25 DIAGNOSIS — I495 Sick sinus syndrome: Secondary | ICD-10-CM

## 2019-07-25 LAB — CUP PACEART REMOTE DEVICE CHECK
Battery Impedance: 1447 Ohm
Battery Remaining Longevity: 51 mo
Battery Voltage: 2.77 V
Brady Statistic AP VP Percent: 0 %
Brady Statistic AP VS Percent: 34 %
Brady Statistic AS VP Percent: 0 %
Brady Statistic AS VS Percent: 66 %
Date Time Interrogation Session: 20210518164635
Implantable Lead Implant Date: 20101105
Implantable Lead Implant Date: 20101105
Implantable Lead Location: 753859
Implantable Lead Location: 753860
Implantable Lead Model: 5076
Implantable Lead Model: 5076
Implantable Pulse Generator Implant Date: 20101105
Lead Channel Impedance Value: 432 Ohm
Lead Channel Impedance Value: 448 Ohm
Lead Channel Pacing Threshold Amplitude: 0.625 V
Lead Channel Pacing Threshold Amplitude: 0.625 V
Lead Channel Pacing Threshold Pulse Width: 0.4 ms
Lead Channel Pacing Threshold Pulse Width: 0.4 ms
Lead Channel Setting Pacing Amplitude: 2 V
Lead Channel Setting Pacing Amplitude: 2.5 V
Lead Channel Setting Pacing Pulse Width: 0.4 ms
Lead Channel Setting Sensing Sensitivity: 4 mV

## 2019-07-27 NOTE — Progress Notes (Signed)
Remote pacemaker transmission.   

## 2019-08-04 ENCOUNTER — Ambulatory Visit
Admission: RE | Admit: 2019-08-04 | Discharge: 2019-08-04 | Disposition: A | Discharge status: Home / Self Care | Payer: Medicare Other | Source: Ambulatory Visit | Attending: Internal Medicine | Admitting: Internal Medicine

## 2019-08-04 DIAGNOSIS — Z87891 Personal history of nicotine dependence: Secondary | ICD-10-CM | POA: Diagnosis not present

## 2019-08-04 DIAGNOSIS — Z720 Tobacco use: Secondary | ICD-10-CM

## 2019-08-08 ENCOUNTER — Other Ambulatory Visit: Payer: Self-pay | Admitting: Internal Medicine

## 2019-08-08 ENCOUNTER — Telehealth: Payer: Self-pay | Admitting: *Deleted

## 2019-08-08 NOTE — Progress Notes (Signed)
Called Dr Tery Sanfilippo back about CT chest, discussed renal mass previously classified as bosniak II by Ct abd in 2014.  He reviewed this study and will issue addendum to ct report.

## 2019-08-08 NOTE — Telephone Encounter (Signed)
Please see edited result for chest lung cancer screening CT

## 2019-08-10 ENCOUNTER — Other Ambulatory Visit: Payer: Self-pay

## 2019-08-10 ENCOUNTER — Ambulatory Visit (INDEPENDENT_AMBULATORY_CARE_PROVIDER_SITE_OTHER): Payer: Medicare Other | Admitting: Internal Medicine

## 2019-08-10 ENCOUNTER — Encounter: Payer: Self-pay | Admitting: Internal Medicine

## 2019-08-10 VITALS — BP 147/102 | HR 82 | Temp 98.2°F | Ht 73.0 in | Wt 172.7 lb

## 2019-08-10 DIAGNOSIS — I495 Sick sinus syndrome: Secondary | ICD-10-CM | POA: Diagnosis not present

## 2019-08-10 DIAGNOSIS — I700 Atherosclerosis of aorta: Secondary | ICD-10-CM | POA: Diagnosis not present

## 2019-08-10 DIAGNOSIS — M654 Radial styloid tenosynovitis [de Quervain]: Secondary | ICD-10-CM | POA: Diagnosis not present

## 2019-08-10 DIAGNOSIS — I10 Essential (primary) hypertension: Secondary | ICD-10-CM | POA: Diagnosis not present

## 2019-08-10 DIAGNOSIS — J449 Chronic obstructive pulmonary disease, unspecified: Secondary | ICD-10-CM | POA: Diagnosis not present

## 2019-08-10 DIAGNOSIS — D573 Sickle-cell trait: Secondary | ICD-10-CM

## 2019-08-10 DIAGNOSIS — N281 Cyst of kidney, acquired: Secondary | ICD-10-CM

## 2019-08-10 MED ORDER — AMLODIPINE-ATORVASTATIN 10-80 MG PO TABS: 1.0000 | ORAL_TABLET | Freq: Every day | ORAL | 3 refills | Status: DC

## 2019-08-10 MED ORDER — CHLORTHALIDONE 25 MG PO TABS: 25.0000 mg | ORAL_TABLET | Freq: Every day | ORAL | 3 refills | Status: DC

## 2019-08-10 NOTE — Assessment & Plan Note (Signed)
Again noted on his CT.  Discussed with him the need to take high intensity statin hopefully with medication simplification will be able to consistently take this.  We will check a lipid panel at next visit.

## 2019-08-10 NOTE — Assessment & Plan Note (Signed)
HPI: He is still smoking he reports about 4 to 5 cigarettes a day.  Notes that it is mostly a habit when he drinks something hot or cold.  Denies significant shortness of breath able to do all of his normal activities without stopping to catch his breath.  Assessment COPD with asthma, panlobular emphysema  Plan Continue albuterol as needed discussed the importance of smoking cessation.

## 2019-08-10 NOTE — Assessment & Plan Note (Signed)
Status post permanent pacemaker no issues.

## 2019-08-10 NOTE — Assessment & Plan Note (Signed)
Reviewed CT with patient discussed that renal cyst has decreased in size and we will simply follow with his yearly chest CT.

## 2019-08-10 NOTE — Assessment & Plan Note (Signed)
HPI: He reports to me that he has not been taking any of his blood pressure medications.  He denies any side effects from the medications when he takes some however he feels he is simply prescribed too many pills and will not take some any medications.  We discussed the nature of his high blood pressure and he is willing to take medication.  Assessment essential hypertension uncontrolled  Plan I have greatly simplified his medication regimen.  He is agreeable to the new medication regiment. Amlodipine atorvastatin 10-80 mg once daily Chlorthalidone 25 mg once daily Follow-up 2 to 3 months for blood pressure and repeat labs.

## 2019-08-10 NOTE — Patient Instructions (Signed)
I want you to buy a left thumb splint.  Stop buying cigerettes, not having them avaliable will help you quit.  I simplified your medications to only three pills. A baby aspirin and two other pills.

## 2019-08-10 NOTE — Progress Notes (Signed)
  Subjective:  HPI: [name] is a 69 y.o. male who presents for f/u HTN and CT results.  Please see Assessment and Plan below for the status of his chronic medical problems.  Objective:  Physical Exam: Vitals:   08/10/19 0844  BP: (!) 147/102  Pulse: 82  Temp: 98.2 F (36.8 C)  TempSrc: Oral  SpO2: 100%  Weight: 172 lb 11.2 oz (78.3 kg)  Height: 6\' 1"  (1.854 m)   Body mass index is 22.79 kg/m. Physical Exam Vitals and nursing note reviewed.  Constitutional:      Appearance: Normal appearance.  HENT:     Mouth/Throat:     Mouth: Mucous membranes are moist.  Cardiovascular:     Rate and Rhythm: Normal rate and regular rhythm.  Pulmonary:     Effort: Pulmonary effort is normal.     Breath sounds: Normal breath sounds.  Musculoskeletal:     Comments: Positive finklestein exam on left. No synovitis of MCP or CMC joint  Neurological:     Mental Status: He is alert.  Psychiatric:        Mood and Affect: Mood normal.    Assessment & Plan:  See Encounters Tab for problem based charting.  Medications Ordered Meds ordered this encounter  Medications  . amLODipine-atorvastatin (CADUET) 10-80 MG tablet    Sig: Take 1 tablet by mouth daily.    Dispense:  90 tablet    Refill:  3  . chlorthalidone (HYGROTON) 25 MG tablet    Sig: Take 1 tablet (25 mg total) by mouth daily.    Dispense:  90 tablet    Refill:  3    Discontinue HCTZ   Other Orders No orders of the defined types were placed in this encounter.  Follow Up: Return 2-3 months with Dr Heber Delta.

## 2019-08-10 NOTE — Assessment & Plan Note (Signed)
Still present.  Stable.

## 2019-08-10 NOTE — Assessment & Plan Note (Signed)
HPI: He brings my attention some left thumb pain he has been having for several months possibly years.  He notes that he is right-handed he does not use his left hand from his anything except for holding a tampering.  Assessment de Quervain's tenosynovitis  Plan OTC Wrist splint

## 2019-08-14 NOTE — Addendum Note (Signed)
Addended by: Hulan Fray on: 08/14/2019 05:56 PM   Modules accepted: Orders

## 2019-09-17 ENCOUNTER — Emergency Department (HOSPITAL_COMMUNITY)
Admission: EM | Admit: 2019-09-17 | Discharge: 2019-09-17 | Disposition: A | Discharge status: Home / Self Care | Payer: Medicare Other | Attending: Emergency Medicine | Admitting: Emergency Medicine

## 2019-09-17 ENCOUNTER — Ambulatory Visit (INDEPENDENT_AMBULATORY_CARE_PROVIDER_SITE_OTHER)
Admission: EM | Admit: 2019-09-17 | Discharge: 2019-09-17 | Disposition: A | Discharge status: Home / Self Care | Payer: Medicare Other | Source: Home / Self Care | Attending: Family Medicine | Admitting: Family Medicine

## 2019-09-17 ENCOUNTER — Encounter (HOSPITAL_COMMUNITY): Payer: Self-pay | Admitting: Emergency Medicine

## 2019-09-17 ENCOUNTER — Other Ambulatory Visit: Payer: Self-pay

## 2019-09-17 DIAGNOSIS — Z7982 Long term (current) use of aspirin: Secondary | ICD-10-CM | POA: Insufficient documentation

## 2019-09-17 DIAGNOSIS — F17210 Nicotine dependence, cigarettes, uncomplicated: Secondary | ICD-10-CM | POA: Diagnosis not present

## 2019-09-17 DIAGNOSIS — R072 Precordial pain: Secondary | ICD-10-CM | POA: Diagnosis present

## 2019-09-17 DIAGNOSIS — I10 Essential (primary) hypertension: Secondary | ICD-10-CM | POA: Insufficient documentation

## 2019-09-17 DIAGNOSIS — Z4502 Encounter for adjustment and management of automatic implantable cardiac defibrillator: Secondary | ICD-10-CM | POA: Diagnosis not present

## 2019-09-17 DIAGNOSIS — Z79899 Other long term (current) drug therapy: Secondary | ICD-10-CM | POA: Diagnosis not present

## 2019-09-17 DIAGNOSIS — J449 Chronic obstructive pulmonary disease, unspecified: Secondary | ICD-10-CM | POA: Insufficient documentation

## 2019-09-17 DIAGNOSIS — J45909 Unspecified asthma, uncomplicated: Secondary | ICD-10-CM | POA: Diagnosis not present

## 2019-09-17 DIAGNOSIS — Z8673 Personal history of transient ischemic attack (TIA), and cerebral infarction without residual deficits: Secondary | ICD-10-CM | POA: Diagnosis not present

## 2019-09-17 LAB — CBC
HCT: 37.8 % — ABNORMAL LOW (ref 39.0–52.0)
Hemoglobin: 12.8 g/dL — ABNORMAL LOW (ref 13.0–17.0)
MCH: 29.6 pg (ref 26.0–34.0)
MCHC: 33.9 g/dL (ref 30.0–36.0)
MCV: 87.3 fL (ref 80.0–100.0)
Platelets: 203 10*3/uL (ref 150–400)
RBC: 4.33 MIL/uL (ref 4.22–5.81)
RDW: 15 % (ref 11.5–15.5)
WBC: 4.8 10*3/uL (ref 4.0–10.5)
nRBC: 0 % (ref 0.0–0.2)

## 2019-09-17 LAB — BASIC METABOLIC PANEL
Anion gap: 11 (ref 5–15)
BUN: 12 mg/dL (ref 8–23)
CO2: 22 mmol/L (ref 22–32)
Calcium: 8.9 mg/dL (ref 8.9–10.3)
Chloride: 105 mmol/L (ref 98–111)
Creatinine, Ser: 1.31 mg/dL — ABNORMAL HIGH (ref 0.61–1.24)
GFR calc Af Amer: >60 mL/min (ref >60)
GFR calc non Af Amer: 55 mL/min — ABNORMAL LOW (ref >60)
Glucose, Bld: 85 mg/dL (ref 70–99)
Potassium: 4.9 mmol/L (ref 3.5–5.1)
Sodium: 138 mmol/L (ref 135–145)

## 2019-09-17 LAB — TROPONIN I (HIGH SENSITIVITY)
Troponin I (High Sensitivity): 4 ng/L (ref <18)
Troponin I (High Sensitivity): 5 ng/L (ref <18)

## 2019-09-17 MED ORDER — SODIUM CHLORIDE 0.9% FLUSH: 3.0000 mL | Freq: Once | INTRAVENOUS | Status: DC

## 2019-09-17 NOTE — Discharge Instructions (Signed)

## 2019-09-17 NOTE — ED Triage Notes (Signed)
Pt states his defibrillator fired 2-3 times while at church today.  Denies chest pain, SOB, nausea, or any other associated symptoms.  States he feels like he can run a marathon but his wife wanted him to come to hospital.  Pt went to [facility] and brought to ED by RN.

## 2019-09-17 NOTE — ED Triage Notes (Addendum)
Pt states his auto defibrillator discharged 3 times while he was at church today. Denies CP, SOB, nausea. Pt states his "pressure was high afterwards"; not certain of measurement.   Pt states his defibrillator has not discharged in approx 1 year.  Denies CP, SOB or other c/o.  EKG performed. Notified Rondel Oh, NP of pt c/c and N. Burky at [facility] for eval and explained to pt need to go to ER STAT for further eval/tx.  Patient is being discharged from the Urgent Care and sent to the Emergency Department via [place]. Per Rondel Oh, patient is in need of higher level of care due to defibrillator discharge Patient is aware and verbalizes understanding of plan of care.  Vitals:   09/17/19 1311  BP: (!) 121/96  Pulse: 73  Resp: 18  Temp: 98.2 F (36.8 C)  SpO2: 100%

## 2019-09-17 NOTE — ED Provider Notes (Signed)
BREANNA MCDANIEL presents stating that his defibrillator went off three times while in church today. Feels well without complaints and states his wife wanted him to be evaluated. History of atrial tachycardia as well as CVA. Last saw cardiology over a year ago. States he was told his pacemaker was "at 45%", so while he has had it placed for over 10 years it did not yet need to be replaced. Has had his defibrillator discharge in the past but last episode was at least a year ago. Denies chest pain , palpitations, shortness of breath , dizziness, arm or jaw pain, nausea or sweating. He has been taking his prescribed medications as directed. Denies any specific known triggers today, was sitting in church when it discharged. EKG is atrial paced with some stwave changes in comparison to previous EKG on 6/5.   Patient provided wheelchair assistance to ER by RN for further evaluation.       Zigmund Gottron, NP 09/17/19 1325

## 2019-09-17 NOTE — Discharge Instructions (Signed)
We will assist you in getting to the ER for further evaluation.

## 2019-09-17 NOTE — ED Provider Notes (Signed)
[facility] EMERGENCY DEPARTMENT Provider Note   CSN: [id number] Arrival date & time: 09/17/19  1339     History Chief Complaint  Patient presents with  . defib fired    [name] is a 69 y.o. male.  HPI    69 y/o comes in with cc of chest discomfort. Pt had sudden episode of "stinging" pain 2-3 times while he was sitting in church. Pt had no prodrome prior to the event. He hasn't had the stinging pain since. Pt went to UC and was advised to come to the ER.  Denies any chest pain with exertion in the recent past.  Patient is fairly active.  Pt has hx of atrial tachycardia, stroke and reports that he has a pacemaker in place.   Past Medical History:  Diagnosis Date  . Atrial tachycardia ([place]) 04/11/2012  . CEREBROVASCULAR ACCIDENT 10/17/2008   Acute CVA of right thalamus 10/03/08 ECHO performed 2/2 CVA 10/09/08, EF 55-65%  Aspirin 325 mg daily and try to work on quitting smoking.   . Emphysema    per multiple CXRs  . Hypertension    goal < 140/90  . Sickle cell trait ([place])   . Snoring    Sleep study 09/02/10 was WNL  . Stroke [facility]) 10/03/08   Right thalamus  . Tuberculosis at age 57-3    Patient Active Problem List   Diagnosis Date Noted  . De Quervain's tenosynovitis, left 08/10/2019  . Primary osteoarthritis of left knee 07/21/2018  . Primary osteoarthritis of right knee 04/06/2018  . Baker's cyst of knee, left 04/06/2018  . TMJ disease 09/17/2017  . Sickle cell trait ([place])   . Abdominal aortic atherosclerosis ([place]) 04/21/2016  . Urinary frequency 01/04/2015  . Seasonal allergies 06/15/2014  . Renal cyst, right 02/08/2013  . History of tuberculosis 01/19/2013  . Sinoatrial node dysfunction ([facility]) 04/11/2012  . Healthcare maintenance 11/26/2011  . Pacemaker 11/25/2011  . COPD with asthma ([place])   . GERD (gastroesophageal reflux disease) 02/04/2011  . Tobacco abuse 07/01/2010  . Hyperlipidemia 09/30/2009  . MIGRAINE WITHOUT AURA 09/30/2009   . ERECTILE DYSFUNCTION, ORGANIC 01/16/2009  . Essential hypertension 11/22/2008    Past Surgical History:  Procedure Laterality Date  . APPENDECTOMY  1977  . PACEMAKER INSERTION  07/24/08   MDT Adapta L implanted by Dr Blanch Media  . VASECTOMY  1990       Family History  Problem Relation Age of Onset  . Cancer Sister        Unknown  . Hypertension Mother   . Sickle cell trait Son   . Sickle cell anemia Son     Social History   Tobacco Use  . Smoking status: Current Every Day Smoker    Packs/day: 0.30    Years: 10.00    Pack years: 3.00    Types: Cigarettes  . Smokeless tobacco: Never Used  . Tobacco comment: Cutting back. DOWN TO 3-4 CIAGARETTES  Vaping Use  . Vaping Use: Never used  Substance Use Topics  . Alcohol use: No    Alcohol/week: 0.0 standard drinks  . Drug use: No    Comment: previous polysubstance abuser, quit x 13 years    Home Medications Prior to Admission medications   Medication Sig Start Date End Date Taking? Authorizing Provider  albuterol (VENTOLIN HFA) 108 (90 Base) MCG/ACT inhaler Inhale 2 puffs into the lungs every 6 (six) hours as needed for wheezing or shortness of breath. 05/24/19   Joni Reining  C, DO  amLODipine-atorvastatin (CADUET) 10-80 MG tablet Take 1 tablet by mouth daily. 08/10/19 08/09/20  [name], DO  aspirin EC 81 MG tablet Take 81 mg by mouth at bedtime.    [provider]  chlorthalidone (HYGROTON) 25 MG tablet Take 1 tablet (25 mg total) by mouth daily. 08/10/19   [name], DO    Allergies    Ace inhibitors  Review of Systems   Review of Systems  Constitutional: Positive for activity change.  Respiratory: Negative for shortness of breath.   Cardiovascular: Positive for chest pain.  Gastrointestinal: Negative for nausea and vomiting.  Neurological: Negative for dizziness.  All other systems reviewed and are negative.   Physical Exam Updated Vital Signs BP (!) 144/99   Pulse 64   Temp 97.9 F  (36.6 C)   Resp 13   SpO2 100%   Physical Exam Vitals and nursing note reviewed.  Constitutional:      Appearance: He is well-developed.  HENT:     Head: Atraumatic.  Cardiovascular:     Rate and Rhythm: Normal rate.  Pulmonary:     Effort: Pulmonary effort is normal.  Musculoskeletal:     Cervical back: Neck supple.  Skin:    General: Skin is warm.  Neurological:     Mental Status: He is alert and oriented to person, place, and time.     ED Results / Procedures / Treatments   Labs (all labs ordered are listed, but only abnormal results are displayed) Labs Reviewed  BASIC METABOLIC PANEL - Abnormal; Notable for the following components:      Result Value   Creatinine, Ser 1.31 (*)    GFR calc non Af Amer 55 (*)    All other components within normal limits  CBC - Abnormal; Notable for the following components:   Hemoglobin 12.8 (*)    HCT 37.8 (*)    All other components within normal limits  TROPONIN I (HIGH SENSITIVITY)  TROPONIN I (HIGH SENSITIVITY)    EKG None  Date: 09/17/2019  Rate: 66  Rhythm: Atrial paced rhythm  QRS Axis: normal  Intervals: normal  ST/T Wave abnormalities: normal  Conduction Disutrbances: none  Narrative Interpretation: unremarkable  Radiology No results found.  Procedures Procedures (including critical care time)  Medications Ordered in ED Medications  sodium chloride flush (NS) 0.9 % injection 3 mL (has no administration in time range)    ED Course  I have reviewed the triage vital signs and the nursing notes.  Pertinent labs & imaging results that were available during my care of the patient were reviewed by me and considered in my medical decision making (see chart for details).    MDM Rules/Calculators/A&P                          69 year old comes in a chief complaint of chest pain.  He had 3 episodes of stinging pain that lasted a second and resolved on their own.  Subsequently he has not had any chest pain.   Allegedly he had ataxic gait after the episode of chest pain, but patient states that he has been walking fine.  He went to the urgent care, walked in without any ataxia and was advised to come to the ER, with concerns that there was firing of his defibrillator.  Patient does not have a defibrillator.  His delta troponin are normal and reassuring.  I do not think his event  was ACS.  We tried to interrogate his pacemaker however our interrogating device is not working.  I do not think he had any dysrhythmia.  He is stable for discharge with strict ER return precautions.  Final Clinical Impression(s) / ED Diagnoses Final diagnoses:  Precordial pain    Rx / DC Orders ED Discharge Orders    None       Varney Biles, MD 09/17/19 1932

## 2019-09-17 NOTE — ED Notes (Signed)
Report and EKG given to Santiago Glad, RN triage nurse of [facility] ED. Pt stable at transfer of care.

## 2019-09-17 NOTE — ED Notes (Signed)
Attempted to interrogate pacemaker on 3 devices, unsucessful. Dr Kathrynn Humble informed

## 2019-10-24 ENCOUNTER — Ambulatory Visit (INDEPENDENT_AMBULATORY_CARE_PROVIDER_SITE_OTHER): Payer: Medicare Other | Admitting: *Deleted

## 2019-10-24 DIAGNOSIS — I495 Sick sinus syndrome: Secondary | ICD-10-CM

## 2019-10-27 LAB — CUP PACEART REMOTE DEVICE CHECK
Battery Impedance: 1529 Ohm
Battery Remaining Longevity: 48 mo
Battery Voltage: 2.77 V
Brady Statistic AP VP Percent: 0 %
Brady Statistic AP VS Percent: 33 %
Brady Statistic AS VP Percent: 0 %
Brady Statistic AS VS Percent: 67 %
Date Time Interrogation Session: 20210819175436
Implantable Lead Implant Date: 20101105
Implantable Lead Implant Date: 20101105
Implantable Lead Location: 753859
Implantable Lead Location: 753860
Implantable Lead Model: 5076
Implantable Lead Model: 5076
Implantable Pulse Generator Implant Date: 20101105
Lead Channel Impedance Value: 425 Ohm
Lead Channel Impedance Value: 457 Ohm
Lead Channel Pacing Threshold Amplitude: 0.625 V
Lead Channel Pacing Threshold Amplitude: 0.75 V
Lead Channel Pacing Threshold Pulse Width: 0.4 ms
Lead Channel Pacing Threshold Pulse Width: 0.4 ms
Lead Channel Setting Pacing Amplitude: 2 V
Lead Channel Setting Pacing Amplitude: 2.5 V
Lead Channel Setting Pacing Pulse Width: 0.4 ms
Lead Channel Setting Sensing Sensitivity: 4 mV

## 2019-10-30 NOTE — Progress Notes (Signed)
Remote pacemaker transmission.   

## 2019-11-09 ENCOUNTER — Other Ambulatory Visit: Payer: Self-pay | Admitting: Internal Medicine

## 2019-11-09 DIAGNOSIS — N529 Male erectile dysfunction, unspecified: Secondary | ICD-10-CM

## 2019-12-21 ENCOUNTER — Other Ambulatory Visit: Payer: Self-pay

## 2019-12-21 ENCOUNTER — Telehealth: Payer: Self-pay

## 2019-12-21 ENCOUNTER — Ambulatory Visit (INDEPENDENT_AMBULATORY_CARE_PROVIDER_SITE_OTHER): Payer: Medicare Other

## 2019-12-21 DIAGNOSIS — Z23 Encounter for immunization: Secondary | ICD-10-CM | POA: Diagnosis not present

## 2019-12-21 NOTE — Telephone Encounter (Signed)
Patient came by the office today for his flu shot.  He has requested a refill on his viagra and states this was refused last time.  Med has fallen off active med list. Will forward to PCP  SChaplin, RN,BSN

## 2019-12-22 MED ORDER — SILDENAFIL CITRATE 50 MG PO TABS
50.0000 mg | ORAL_TABLET | ORAL | 2 refills | Status: DC | PRN
Start: 2019-12-22 — End: 2021-10-16

## 2019-12-27 ENCOUNTER — Encounter: Payer: Self-pay | Admitting: Student

## 2019-12-27 ENCOUNTER — Ambulatory Visit (INDEPENDENT_AMBULATORY_CARE_PROVIDER_SITE_OTHER): Payer: Medicare Other | Admitting: Student

## 2019-12-27 ENCOUNTER — Other Ambulatory Visit (HOSPITAL_COMMUNITY)
Admission: RE | Admit: 2019-12-27 | Discharge: 2019-12-27 | Disposition: A | Discharge status: Home / Self Care | Payer: Medicare Other | Source: Ambulatory Visit | Attending: Student in an Organized Health Care Education/Training Program | Admitting: Student in an Organized Health Care Education/Training Program

## 2019-12-27 VITALS — BP 140/101 | HR 67 | Temp 97.7°F | Wt 165.4 lb

## 2019-12-27 DIAGNOSIS — A599 Trichomoniasis, unspecified: Secondary | ICD-10-CM | POA: Diagnosis not present

## 2019-12-27 DIAGNOSIS — I10 Essential (primary) hypertension: Secondary | ICD-10-CM | POA: Diagnosis not present

## 2019-12-27 DIAGNOSIS — N342 Other urethritis: Secondary | ICD-10-CM | POA: Insufficient documentation

## 2019-12-27 DIAGNOSIS — E7849 Other hyperlipidemia: Secondary | ICD-10-CM

## 2019-12-27 DIAGNOSIS — J439 Emphysema, unspecified: Secondary | ICD-10-CM

## 2019-12-27 DIAGNOSIS — Z202 Contact with and (suspected) exposure to infections with a predominantly sexual mode of transmission: Secondary | ICD-10-CM | POA: Diagnosis not present

## 2019-12-27 MED ORDER — DOXYCYCLINE HYCLATE 100 MG PO TABS: 100.0000 mg | ORAL_TABLET | Freq: Two times a day (BID) | ORAL | 0 refills | Status: AC

## 2019-12-27 NOTE — Progress Notes (Signed)
   CC: STI testing and treatment  HPI:  [name] is a 69 y.o. male with PMH of CVA, HTN and emphysema who presents to clinic to get treatment for chlamydia and further STI testing.  Please see problem based charting for evaluation, assessment and plan.  Past Medical History:  Diagnosis Date  . Atrial tachycardia ([place]) 04/11/2012  . CEREBROVASCULAR ACCIDENT 10/17/2008   Acute CVA of right thalamus 10/03/08 ECHO performed 2/2 CVA 10/09/08, EF 55-65%  Aspirin 325 mg daily and try to work on quitting smoking.   . Emphysema    per multiple CXRs  . Hypertension    goal < 140/90  . Sickle cell trait ([place])   . Snoring    Sleep study 09/02/10 was WNL  . Stroke [facility] 10/03/08   Right thalamus  . Tuberculosis at age 79-3   Review of Systems: Patient endorse burning during urination, whitish discharge and itching in his groin but denies any fever, chills, hematuria or abdominal pain.  Physical Exam:  General: Pleasant elderly man. No acute distress. Well nourished, well developed. Head: Normocephalic, atraumatic Cardiac: RRR. No murmurs, rubs or gallops. S1, S2. No lower extremities edema Respiratory: Lungs CTAB. No wheezing or crackles. No increased WOB Abdominal: Soft, symmetric and non tender. No organomegaly. Normal bowel sounds Skin: Warm, dry and intact without rashes or lesions Extremities: Atraumatic. Full ROM. Pulse palpable. Neuro: A&O x 3. Moves all extremities.  Psych: Appropriate mood and affect. Normal judgement  Vitals:   12/27/19 1344 12/27/19 1413  BP: (!) 158/106 (!) 140/101  Pulse: 84 67  Temp: 97.7 F (36.5 C)   TempSrc: Oral   SpO2: 99%   Weight: 165 lb 6.4 oz (75 kg)      Assessment & Plan:   See Encounters Tab for problem based charting.  Patient seen with Dr. Emelia Salisbury, MD, MPH

## 2019-12-27 NOTE — Patient Instructions (Signed)
Thank you, [name] for allowing [place] to provide your care today. Today we discussed your concerns about chlamydia infection and other STI testing. We have collected a urine sample and blood to check for STI infection.  We are starting with treatment for chlamydia. Please make sure to complete the whole treatment in order for it to be effective. We will call you when your lab results comes back.  The state requires that you inform your partner about your STI infection so she can seek medical care and get treated.  I have ordered the following labs for you:   Lab Orders     BMP8+Anion Gap     HIV antibody (with reflex)     RPR   I will call if any are abnormal. All of your labs can be accessed through "My Chart".  I have ordered the following tests: Urine cytology to check for GC chlamydia and trichomonas  I have ordered the following medication/changed the following medications:  1. Start taking doxycycline 100 mg twice a day for 7 days  Please follow-up in 4 weeks.  Should you have any questions or concerns please call the internal medicine clinic at [phone].    Linwood Dibbles, MD, MPH [facility]   My Chart Access: [email or web address]   If you have not already done so, please get your COVID 19 vaccine  To schedule an appointment for a COVID vaccine choice any of the following: Go to [email or web address]   Go to [email or web address]                  Call [phone]                                     Call [phone] and select Option 2

## 2019-12-27 NOTE — Assessment & Plan Note (Signed)
Patient's BP was elevated to 158/106 on arrival.  Repeat BP decreased to 140/101. Patient states he has not taken his blood pressure medications today.  Denies any headaches or dizziness.  States he is adherence to his BP medications.  Plan: --Continue chlorthalidone 25 mg daily --Amlodipine-atorvastatin 10-80 mg tablet daily --Follow-up BMP --BP recheck at next follow-up in 4 weeks

## 2019-12-27 NOTE — Assessment & Plan Note (Signed)
Last lipid panel 1 year ago was significant for an LDL of 123. Patient will need repeat lipid panel at next follow-up.  Plan: --Continue amlodipine-atorvastatin 10-80 mg daily --Continue aspirin 81 mg daily at bedtime --Lipid panel at next office visit

## 2019-12-27 NOTE — Assessment & Plan Note (Addendum)
Patient presented to the clinic to get treatment for chlamydia after his wife inform him that she has contracted chlamydia from him. The wife is currently being treated for chlamydia. Patient states he stepped out of his marriage and is currently addressing the situation at home.  Patient endorses burning with urination, whitish discharge and itching in his groin. He denies any hematuria, fevers, chills or abdominal pain. He wants to get tested for other STIs. Patient is informed that he is required to inform the woman he slept with so she can seek medical attention get treated.  Plan: --Start doxycycline 100 mg twice daily for 7 days --Urine cytology for GC/chlamydia and trichomonas --HIV and RPR testing --Encouraged to get partner tested  **ADDENDUM** Patient's urine cytology results came back positive for trichomonas but negative for GC/chlamydia.  HIV and RPR testing were also negative.  Patient was called and informed of results.  Patient was also informed to pick up prescription to treat the trichomonas infection.  Patient was advised to inform partner of his STI so she can seek medical care.  Plan: --Metronidazole (Flagyl) 500 mg twice daily for 7 days

## 2019-12-28 LAB — BMP8+ANION GAP
Anion Gap: 13 mmol/L (ref 10.0–18.0)
BUN/Creatinine Ratio: 11 (ref 10–24)
BUN: 11 mg/dL (ref 8–27)
CO2: 26 mmol/L (ref 20–29)
Calcium: 9.5 mg/dL (ref 8.6–10.2)
Chloride: 102 mmol/L (ref 96–106)
Creatinine, Ser: 0.98 mg/dL (ref 0.76–1.27)
GFR calc Af Amer: 89 mL/min/{1.73_m2} (ref >59)
GFR calc non Af Amer: 77 mL/min/{1.73_m2} (ref >59)
Glucose: 87 mg/dL (ref 65–99)
Potassium: 4 mmol/L (ref 3.5–5.2)
Sodium: 141 mmol/L (ref 134–144)

## 2019-12-28 LAB — URINE CYTOLOGY ANCILLARY ONLY
Chlamydia: NEGATIVE
Comment: NEGATIVE
Comment: NEGATIVE
Comment: NORMAL
Neisseria Gonorrhea: NEGATIVE
Trichomonas: POSITIVE — AB

## 2019-12-28 LAB — RPR: RPR Ser Ql: NONREACTIVE

## 2019-12-28 LAB — HIV ANTIBODY (ROUTINE TESTING W REFLEX): HIV Screen 4th Generation wRfx: NONREACTIVE

## 2019-12-28 NOTE — Progress Notes (Signed)
Will call him once all his labs come back.

## 2019-12-28 NOTE — Progress Notes (Signed)
Internal Medicine Clinic Attending  I saw and evaluated the patient.  I personally confirmed the key portions of the history and exam documented by Dr. Amponsah and I reviewed pertinent patient test results.  The assessment, diagnosis, and plan were formulated together and I agree with the documentation in the resident's note.  

## 2019-12-29 MED ORDER — METRONIDAZOLE 500 MG PO TABS: 500.0000 mg | ORAL_TABLET | Freq: Two times a day (BID) | ORAL | 0 refills | Status: AC

## 2019-12-29 NOTE — Addendum Note (Signed)
Addended byLinwood Dibbles on: 12/29/2019 05:11 PM   Modules accepted: Orders

## 2020-01-23 ENCOUNTER — Ambulatory Visit: Payer: Medicare Other

## 2020-02-19 ENCOUNTER — Ambulatory Visit (INDEPENDENT_AMBULATORY_CARE_PROVIDER_SITE_OTHER): Payer: Medicare Other

## 2020-02-19 DIAGNOSIS — I495 Sick sinus syndrome: Secondary | ICD-10-CM

## 2020-02-19 LAB — CUP PACEART REMOTE DEVICE CHECK
Battery Impedance: 1635 Ohm
Battery Remaining Longevity: 47 mo
Battery Voltage: 2.77 V
Brady Statistic AP VP Percent: 0 %
Brady Statistic AP VS Percent: 32 %
Brady Statistic AS VP Percent: 0 %
Brady Statistic AS VS Percent: 68 %
Date Time Interrogation Session: 20211211155209
Implantable Lead Implant Date: 20101105
Implantable Lead Implant Date: 20101105
Implantable Lead Location: 753859
Implantable Lead Location: 753860
Implantable Lead Model: 5076
Implantable Lead Model: 5076
Implantable Pulse Generator Implant Date: 20101105
Lead Channel Impedance Value: 487 Ohm
Lead Channel Impedance Value: 563 Ohm
Lead Channel Pacing Threshold Amplitude: 0.625 V
Lead Channel Pacing Threshold Amplitude: 0.75 V
Lead Channel Pacing Threshold Pulse Width: 0.4 ms
Lead Channel Pacing Threshold Pulse Width: 0.4 ms
Lead Channel Setting Pacing Amplitude: 2 V
Lead Channel Setting Pacing Amplitude: 2.5 V
Lead Channel Setting Pacing Pulse Width: 0.4 ms
Lead Channel Setting Sensing Sensitivity: 4 mV

## 2020-03-04 NOTE — Progress Notes (Signed)
Remote pacemaker transmission.   

## 2020-03-06 ENCOUNTER — Telehealth: Payer: Self-pay

## 2020-03-06 NOTE — Telephone Encounter (Signed)
Pt states before he gets the booster he wants to be tested for COVID because he is having cold symptoms and wouldn't want to have COVID, get the booster and have a bad outcome. He will get his booster at [facility] and is informed [facility] test for free, he will go there. Call ended

## 2020-03-06 NOTE — Telephone Encounter (Signed)
Pt stated he has what he believes to be a cold but he wants to make sure it is not covid . It started 03/05/20  ( body aches , fever, chills , headache ) please call back

## 2020-03-08 DIAGNOSIS — U071 COVID-19: Secondary | ICD-10-CM | POA: Diagnosis not present

## 2020-03-13 ENCOUNTER — Telehealth: Payer: Self-pay | Admitting: *Deleted

## 2020-03-13 NOTE — Telephone Encounter (Signed)
Correct- CDC recommendations if asymptomatic at day 5 may end isolation but will still need to use mask for additional 5 days.

## 2020-03-13 NOTE — Telephone Encounter (Signed)
Patient called in stating he tested positive for Covid yesterday. Lives with wife who is also positive. Patient had cold-like symptoms for about a week but is now symptom free. Explained that he must still quarantine for 5 days from test date. Please advise if this is correct. Kinnie Feil, BSN, RN-BC

## 2020-05-30 ENCOUNTER — Telehealth: Payer: Self-pay

## 2020-05-30 MED ORDER — ZOSTER VAC RECOMB ADJUVANTED 50 MCG/0.5ML IM SUSR: 0.5000 mL | Freq: Once | INTRAMUSCULAR | 1 refills | Status: AC

## 2020-05-30 NOTE — Telephone Encounter (Signed)
PT is requesting a shingles shot prescription .Marland KitchenMarland Kitchen

## 2020-05-30 NOTE — Telephone Encounter (Signed)
Ok. Please let him know the shingles vaccine prescription was sent electronically to the [facility] on file.

## 2020-05-30 NOTE — Telephone Encounter (Signed)
Pt called /informed rx sent to [facility].

## 2020-06-24 ENCOUNTER — Encounter: Payer: Medicare (Managed Care) | Admitting: Student in an Organized Health Care Education/Training Program

## 2020-06-26 ENCOUNTER — Other Ambulatory Visit (HOSPITAL_COMMUNITY)
Admission: RE | Admit: 2020-06-26 | Discharge: 2020-06-26 | Disposition: A | Discharge status: Home / Self Care | Payer: Medicare HMO | Source: Ambulatory Visit | Attending: Internal Medicine | Admitting: Internal Medicine

## 2020-06-26 ENCOUNTER — Encounter: Payer: Self-pay | Admitting: Student

## 2020-06-26 ENCOUNTER — Ambulatory Visit (INDEPENDENT_AMBULATORY_CARE_PROVIDER_SITE_OTHER): Payer: Medicare HMO | Admitting: Student

## 2020-06-26 VITALS — BP 163/101 | HR 62 | Temp 98.2°F | Wt 153.8 lb

## 2020-06-26 DIAGNOSIS — I10 Essential (primary) hypertension: Secondary | ICD-10-CM

## 2020-06-26 DIAGNOSIS — Z113 Encounter for screening for infections with a predominantly sexual mode of transmission: Secondary | ICD-10-CM

## 2020-06-26 DIAGNOSIS — J449 Chronic obstructive pulmonary disease, unspecified: Secondary | ICD-10-CM

## 2020-06-26 DIAGNOSIS — Z0000 Encounter for general adult medical examination without abnormal findings: Secondary | ICD-10-CM

## 2020-06-26 DIAGNOSIS — R109 Unspecified abdominal pain: Secondary | ICD-10-CM

## 2020-06-26 DIAGNOSIS — N342 Other urethritis: Secondary | ICD-10-CM

## 2020-06-26 DIAGNOSIS — N281 Cyst of kidney, acquired: Secondary | ICD-10-CM

## 2020-06-26 DIAGNOSIS — E7849 Other hyperlipidemia: Secondary | ICD-10-CM

## 2020-06-26 HISTORY — DX: Unspecified abdominal pain: R10.9

## 2020-06-26 MED ORDER — AMLODIPINE-ATORVASTATIN 10-80 MG PO TABS: 1.0000 | ORAL_TABLET | Freq: Every day | ORAL | 1 refills | Status: AC

## 2020-06-26 MED ORDER — ALBUTEROL SULFATE HFA 108 (90 BASE) MCG/ACT IN AERS: 2.0000 | INHALATION_SPRAY | Freq: Four times a day (QID) | RESPIRATORY_TRACT | 5 refills | Status: DC | PRN

## 2020-06-26 MED ORDER — ASPIRIN EC 81 MG PO TBEC: 81.0000 mg | DELAYED_RELEASE_TABLET | Freq: Every day | ORAL | 1 refills | Status: AC

## 2020-06-26 MED ORDER — CHLORTHALIDONE 25 MG PO TABS: 25.0000 mg | ORAL_TABLET | Freq: Every day | ORAL | 3 refills | Status: DC

## 2020-06-26 MED ORDER — ZOSTER VAC RECOMB ADJUVANTED 50 MCG/0.5ML IM SUSR: 0.5000 mL | Freq: Once | INTRAMUSCULAR | 0 refills | Status: AC

## 2020-06-26 NOTE — Assessment & Plan Note (Addendum)
Patient with history of STIs would like to get tested again today. He denies any fevers, chills, abdominal pain, penile pain or discharge. States he just wants to be proactive due to his history of STIs. Patient's RPR and HIV tests were -6 months ago.  Plan: --Urine cytology check for chlamydia, gonorrhea and trichomoniasis.  Addendum:  Urine cytology was negative for chlamydia, gonorrhea and trichomoniasis. Patient was called and informed.

## 2020-06-26 NOTE — Assessment & Plan Note (Signed)
Patient reports he has had bilateral flank pain for the last few months that is worse on the right. States this pain comes at random times and not aggravated by anything in particular. The pain sometimes wakes him up at night and usually resolves on its own. Sometimes he drinks water when the pain comes on and this seems to help with the pain. He denies any dysuria, hematuria, abdominal pain, fever, chills or N/V. Patient has a history of a renal mass on his right kidney. Patient's symptoms could likely be secondary to kidney stone versus a renal malignancy. Patient due for his yearly lung cancer screening so plan to do CT chest to also evaluate abdominal organs.  Plan: --CT chest lung cancer screening --Follow-up on imaging

## 2020-06-26 NOTE — Patient Instructions (Signed)
Thank you, [name] for allowing [place] to provide your care today. Today we discussed your blood pressure, STD screening, shingles vaccine and your side pain. I have also sent refills of your medications to your pharmacy so make sure to pick them up.   I have ordered the following labs for you:   Lab Orders     BMP8+Anion Gap   I will call if any are abnormal. All of your labs can be accessed through "My Chart".  1. I have ordered the following tests: Urine cytology, CT chest   Please follow-up in 3 months  Should you have any questions or concerns please call the internal medicine clinic at [phone].    Linwood Dibbles, MD, MPH [facility]   My Chart Access: [email or web address]   If you have not already done so, please get your COVID 19 vaccine  To schedule an appointment for a COVID vaccine choice any of the following: Go to [email or web address]   Go to [email or web address]                  Call [phone]                                     Call [phone] and select Option 2

## 2020-06-26 NOTE — Assessment & Plan Note (Signed)
Patient states he did not received the prescription for his shingles vaccine so he would like to get it today.  Patient refused to get the Tdap/Td today and wants to postpone it. Patient also plans to get a COVID booster in the near future.  Plan: --Prescription for zoster vaccine adjuvanted sent to patient's pharmacy.

## 2020-06-26 NOTE — Assessment & Plan Note (Signed)
Patient out of refills for his medications. --Refilled amlodipine-atorvastatin 10-80 mg daily --Refilled aspirin 81 mg daily --Lipid panel at next office visit

## 2020-06-26 NOTE — Progress Notes (Signed)
   CC: Follow-up  HPI:  [name] is a 69 y.o. male with PMH as below who presents to clinic today for follow-up on his chronic medical problems. Please see problem based charting for evaluation, assessment and plan.  Past Medical History:  Diagnosis Date  . Atrial tachycardia ([place]) 04/11/2012  . CEREBROVASCULAR ACCIDENT 10/17/2008   Acute CVA of right thalamus 10/03/08 ECHO performed 2/2 CVA 10/09/08, EF 55-65%  Aspirin 325 mg daily and try to work on quitting smoking.   . Emphysema    per multiple CXRs  . Hypertension    goal < 140/90  . Sickle cell trait ([place])   . Snoring    Sleep study 09/02/10 was WNL  . Stroke [facility]) 10/03/08   Right thalamus  . Tuberculosis at age 76-3    Review of Systems:  Constitutional: Negative for fever or fatigue Eyes: Negative for visual changes Respiratory: Negative for shortness of breath Cardiac: Negative for chest pain MSK: Negative for bilateral side pain GU: Negative for hematuria, dysuria or discharge Abdomen: Negative for abdominal pain, constipation or diarrhea Neuro: Negative for headache or weakness  Physical Exam: General: Pleasant, well-appearing elderly man. No acute distress. Cardiac: RRR. No murmurs, rubs or gallops.  No LE edema Respiratory: Lungs CTAB. No wheezing or crackles. Abdominal: Soft, symmetric and non tender. Normal BS. Skin: Warm, dry and intact without rashes or lesions Back: No CVA tenderness. Tenderness to palpation of L-spine or paraspinal muscles. Normal range of motion. Extremities: Atraumatic. Full ROM. Pulse palpable. Neuro: A&O x 3. Moves all extremities Psych: Appropriate mood and affect.  Vitals:   06/26/20 1406 06/26/20 1526  BP: (!) 170/102 (!) 163/101  Pulse: 70 62  Temp: 98.2 F (36.8 C)   TempSrc: Oral   SpO2: 100%   Weight: 153 lb 12.8 oz (69.8 kg)      Assessment & Plan:   See Encounters Tab for problem based charting.  Patient discussed with Dr. Louann Liv,  MD, MPH

## 2020-06-26 NOTE — Assessment & Plan Note (Signed)
Patient denies any shortness of breath or cough but he reports getting out of breath whenever he exerts himself too much. Patient states he is out of his albuterol. Patient states he is only smoking a few cigarettes a day.  Plan: -- Refilled albuterol 2 puff every 6 hours as needed for shortness of breath or cough -- Patient encouraged to continue cutting down on cigarettes

## 2020-06-26 NOTE — Assessment & Plan Note (Addendum)
Patient's BP today is 170/102 with repeat BP still elevated at 163/101. Patient states he has been out of his antihypertensives for almost a month. He denies any dizziness, visual changes or headaches.   Plan: --Refilled amlodipine-atorvastatin 10-80 mg, 1 tablet daily --Refilled chlorthalidone 25 mg daily --Follow-up BMP --Follow-up in 3 months  ADDENDUM BMP unremarkable. Patient was informed.

## 2020-06-27 LAB — URINE CYTOLOGY ANCILLARY ONLY
Chlamydia: NEGATIVE
Comment: NEGATIVE
Comment: NEGATIVE
Comment: NORMAL
Neisseria Gonorrhea: NEGATIVE
Trichomonas: NEGATIVE

## 2020-06-27 LAB — BMP8+ANION GAP
Anion Gap: 17 mmol/L (ref 10.0–18.0)
BUN/Creatinine Ratio: 9 — ABNORMAL LOW (ref 10–24)
BUN: 9 mg/dL (ref 8–27)
CO2: 24 mmol/L (ref 20–29)
Calcium: 9.5 mg/dL (ref 8.6–10.2)
Chloride: 101 mmol/L (ref 96–106)
Creatinine, Ser: 1.03 mg/dL (ref 0.76–1.27)
Glucose: 89 mg/dL (ref 65–99)
Potassium: 4.9 mmol/L (ref 3.5–5.2)
Sodium: 142 mmol/L (ref 134–144)
eGFR: 78 mL/min/{1.73_m2} (ref >59)

## 2020-06-29 IMAGING — CT CT HEAD WITHOUT CONTRAST
3 of 4 series · 13 of 47 positions shown, 15 images · non-contrast
Comparison: 08/10/2018

CLINICAL DATA: Possible stroke

EXAM:
CT HEAD WITHOUT CONTRAST
TECHNIQUE: Contiguous axial images were obtained from the base of the skull
through the vertex without intravenous contrast.

[Series 3: head without · axial · non-contrast · 0.46mm/px · z∈[-76,+44]mm · 7 of 34 slices shown, 9 images]
[im 5/34  brain]
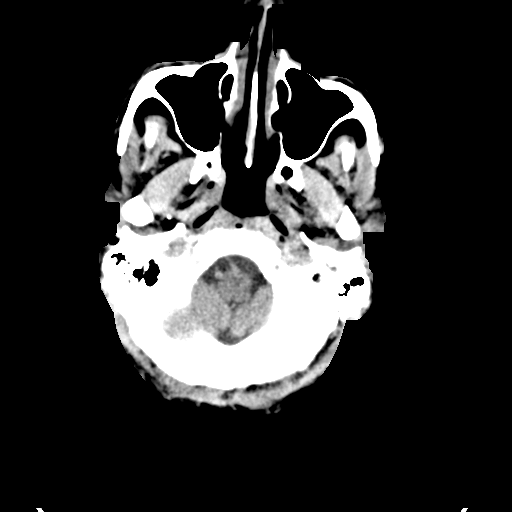
[im 5/34  bone]
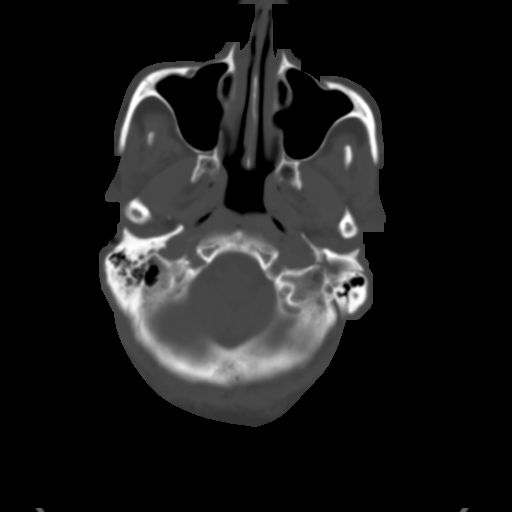
[im 9/34  brain]
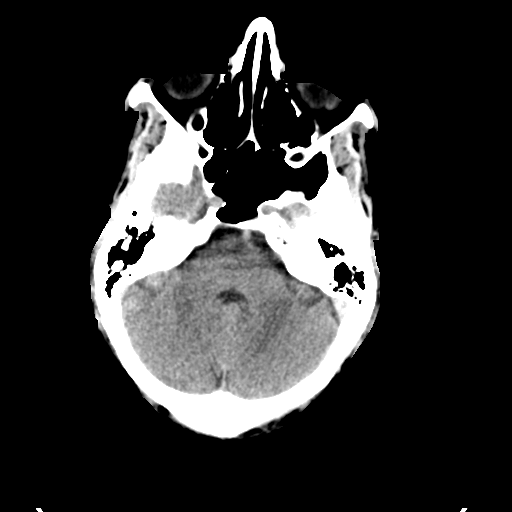
[im 13/34  brain]
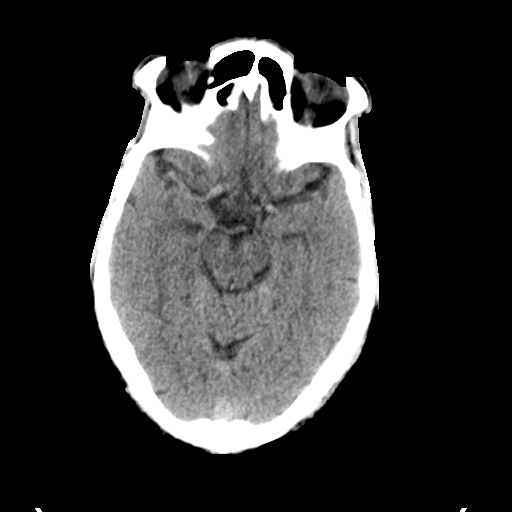
[im 17/34  brain]
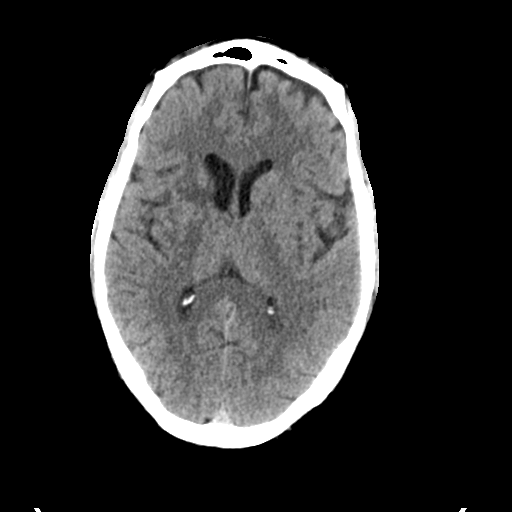
[im 21/34  brain]
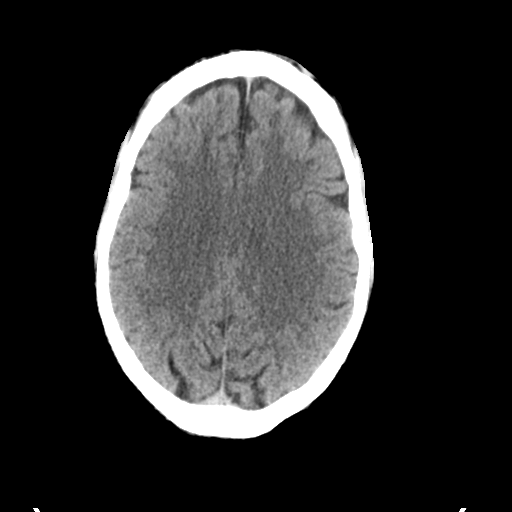
[im 21/34  bone]
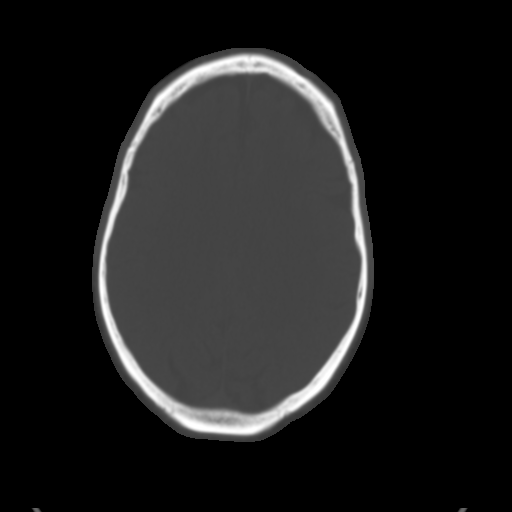
[im 25/34  brain]
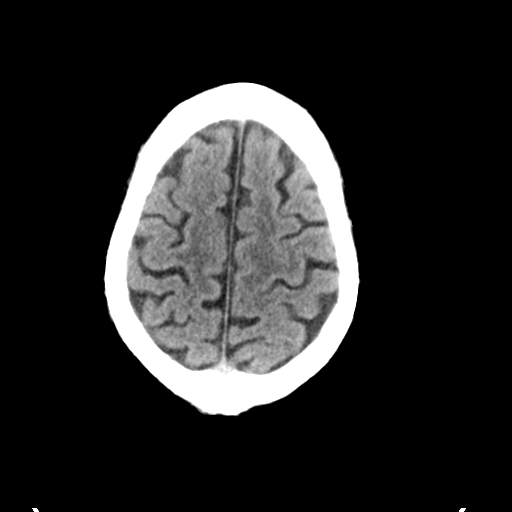
[im 29/34  brain]
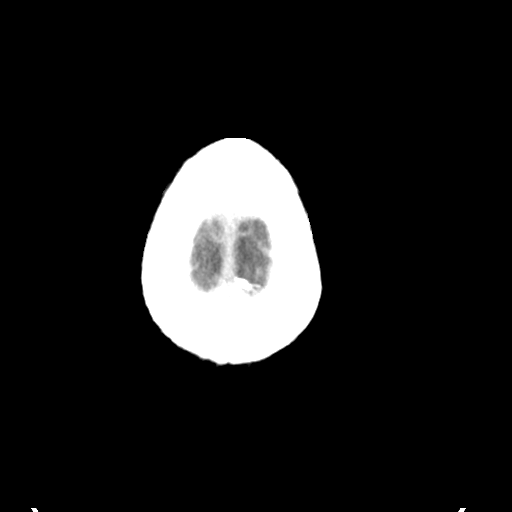

[Series 5: head without cor · coronal · non-contrast · 0.32mm/px · 3 of 79 slices shown]
[im 27/79  brain]
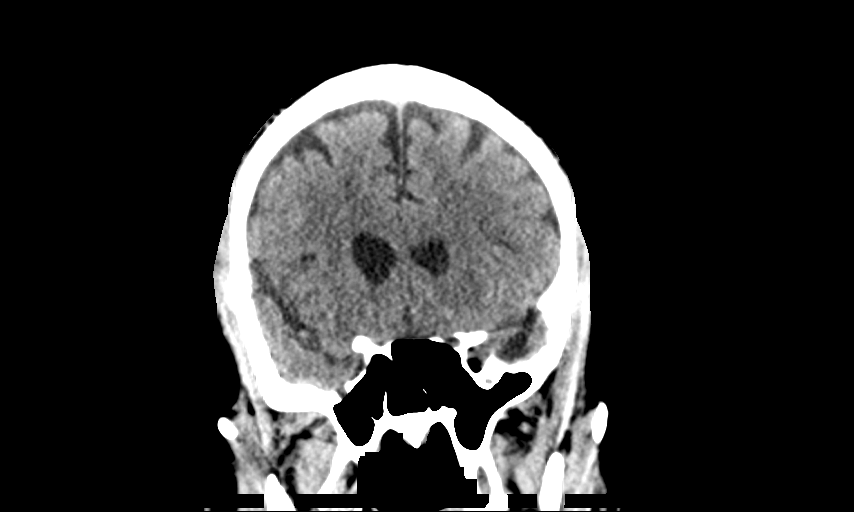
[im 35/79  brain]
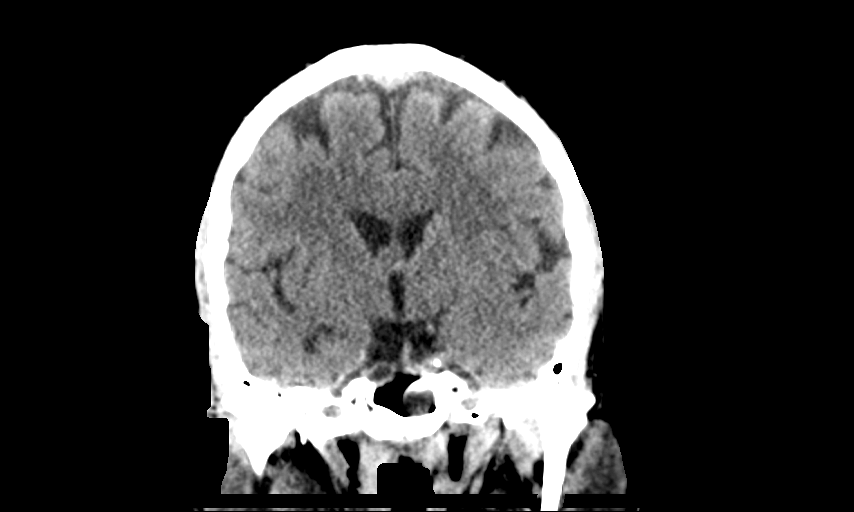
[im 44/79  brain]
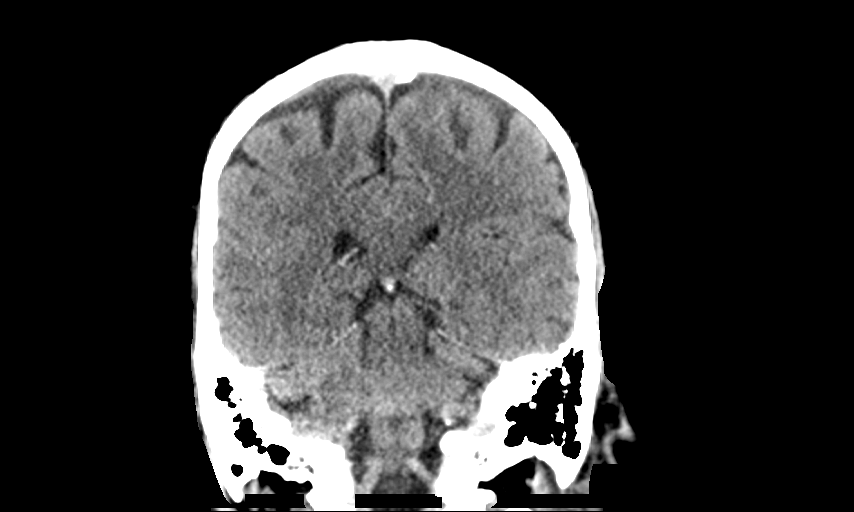

[Series 6: head without sag · sagittal · non-contrast · 0.32mm/px · 3 of 67 slices shown]
[im 23/67  brain]
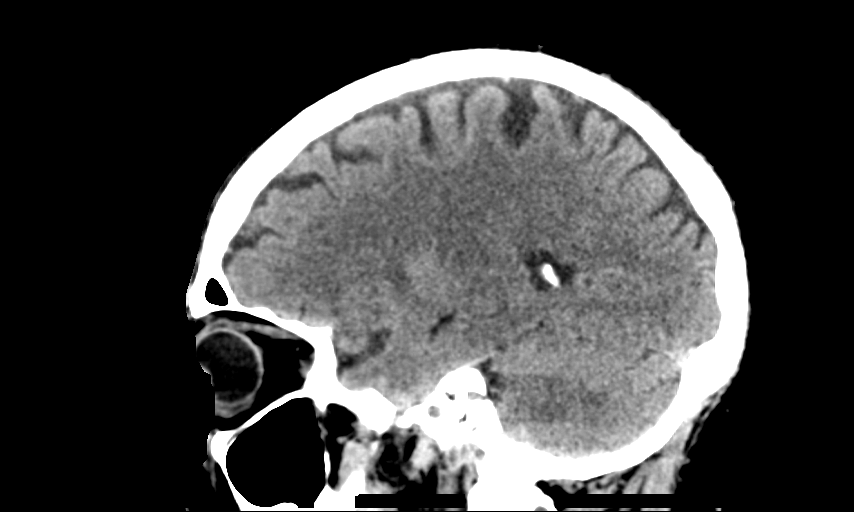
[im 34/67  brain]
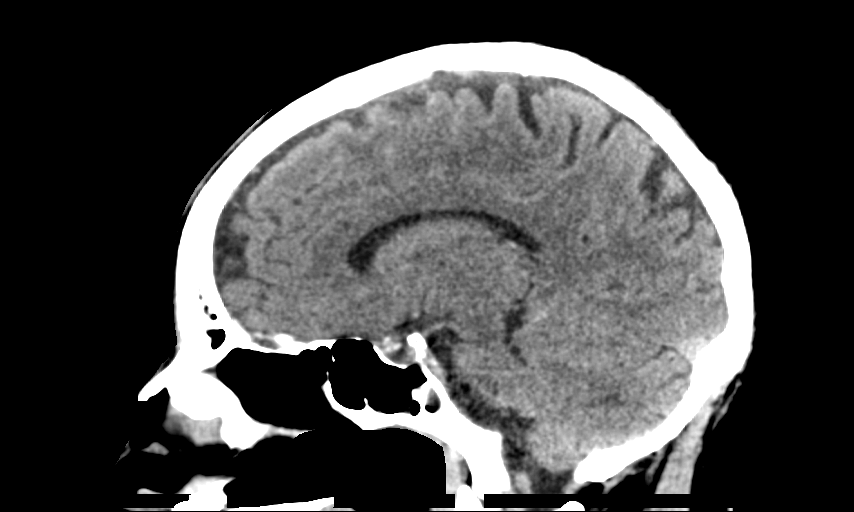
[im 45/67  brain]
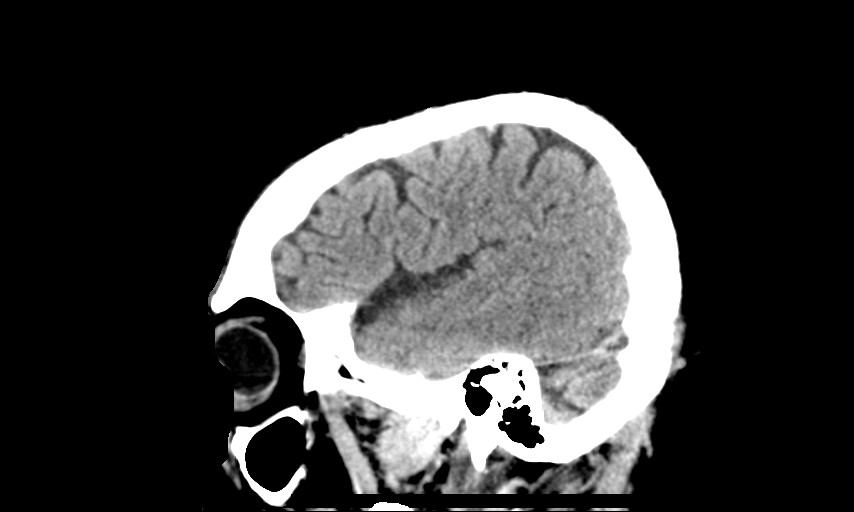

[13 of 47 positions shown; findings below may reference images not displayed]

FINDINGS: Brain: Low-density noted in the right basal ganglia compatible with
lacunar infarct, age indeterminate. No hemorrhage or hydrocephalus.

Vascular: No hyperdense vessel or unexpected calcification.

Skull: No acute calvarial abnormality.

Sinuses/Orbits: Visualized paranasal sinuses and mastoids clear.
Orbital soft tissues unremarkable.

Other: None
IMPRESSION: Low-density within the right basal ganglia compatible with
age-indeterminate lacunar infarct.

## 2020-07-01 NOTE — Progress Notes (Signed)
Internal Medicine Clinic Attending  Case discussed with Dr. Coy Saunas  At the time of the visit.  We reviewed the resident's history and exam and pertinent patient test results.  I agree with the assessment, diagnosis, and plan of care documented in the resident's note. It is anticipated that the screening CT of chest will include kidney images.

## 2020-07-02 ENCOUNTER — Ambulatory Visit (INDEPENDENT_AMBULATORY_CARE_PROVIDER_SITE_OTHER): Payer: Medicare HMO

## 2020-07-02 DIAGNOSIS — I495 Sick sinus syndrome: Secondary | ICD-10-CM | POA: Diagnosis not present

## 2020-07-02 LAB — CUP PACEART REMOTE DEVICE CHECK
Battery Impedance: 1841 Ohm
Battery Remaining Longevity: 41 mo
Battery Voltage: 2.77 V
Brady Statistic AP VP Percent: 0 %
Brady Statistic AP VS Percent: 31 %
Brady Statistic AS VP Percent: 0 %
Brady Statistic AS VS Percent: 68 %
Date Time Interrogation Session: 20220425174802
Implantable Lead Implant Date: 20101105
Implantable Lead Implant Date: 20101105
Implantable Lead Location: 753859
Implantable Lead Location: 753860
Implantable Lead Model: 5076
Implantable Lead Model: 5076
Implantable Pulse Generator Implant Date: 20101105
Lead Channel Impedance Value: 450 Ohm
Lead Channel Impedance Value: 470 Ohm
Lead Channel Pacing Threshold Amplitude: 0.625 V
Lead Channel Pacing Threshold Amplitude: 0.75 V
Lead Channel Pacing Threshold Pulse Width: 0.4 ms
Lead Channel Pacing Threshold Pulse Width: 0.4 ms
Lead Channel Setting Pacing Amplitude: 2 V
Lead Channel Setting Pacing Amplitude: 2.5 V
Lead Channel Setting Pacing Pulse Width: 0.4 ms
Lead Channel Setting Sensing Sensitivity: 4 mV

## 2020-07-09 ENCOUNTER — Other Ambulatory Visit: Payer: Self-pay | Admitting: Internal Medicine

## 2020-07-09 ENCOUNTER — Inpatient Hospital Stay: Admission: RE | Admit: 2020-07-09 | Payer: Medicare HMO | Source: Ambulatory Visit

## 2020-07-09 DIAGNOSIS — Z122 Encounter for screening for malignant neoplasm of respiratory organs: Secondary | ICD-10-CM

## 2020-07-22 NOTE — Progress Notes (Signed)
Remote pacemaker transmission.   

## 2020-08-01 ENCOUNTER — Other Ambulatory Visit: Payer: Self-pay

## 2020-08-01 ENCOUNTER — Telehealth: Payer: Self-pay

## 2020-08-01 ENCOUNTER — Encounter (HOSPITAL_BASED_OUTPATIENT_CLINIC_OR_DEPARTMENT_OTHER): Payer: Self-pay | Admitting: Emergency Medicine

## 2020-08-01 ENCOUNTER — Emergency Department (HOSPITAL_BASED_OUTPATIENT_CLINIC_OR_DEPARTMENT_OTHER): Payer: Medicare HMO

## 2020-08-01 ENCOUNTER — Emergency Department (HOSPITAL_BASED_OUTPATIENT_CLINIC_OR_DEPARTMENT_OTHER)
Admission: EM | Admit: 2020-08-01 | Discharge: 2020-08-01 | Disposition: A | Discharge status: Home / Self Care | Payer: Medicare HMO | Attending: Emergency Medicine | Admitting: Emergency Medicine

## 2020-08-01 DIAGNOSIS — I10 Essential (primary) hypertension: Secondary | ICD-10-CM | POA: Insufficient documentation

## 2020-08-01 DIAGNOSIS — J449 Chronic obstructive pulmonary disease, unspecified: Secondary | ICD-10-CM | POA: Diagnosis not present

## 2020-08-01 DIAGNOSIS — J069 Acute upper respiratory infection, unspecified: Secondary | ICD-10-CM | POA: Diagnosis not present

## 2020-08-01 DIAGNOSIS — Z950 Presence of cardiac pacemaker: Secondary | ICD-10-CM | POA: Diagnosis not present

## 2020-08-01 DIAGNOSIS — Z7982 Long term (current) use of aspirin: Secondary | ICD-10-CM | POA: Diagnosis not present

## 2020-08-01 DIAGNOSIS — F17210 Nicotine dependence, cigarettes, uncomplicated: Secondary | ICD-10-CM | POA: Diagnosis not present

## 2020-08-01 DIAGNOSIS — Z20822 Contact with and (suspected) exposure to covid-19: Secondary | ICD-10-CM | POA: Diagnosis not present

## 2020-08-01 DIAGNOSIS — Z79899 Other long term (current) drug therapy: Secondary | ICD-10-CM | POA: Diagnosis not present

## 2020-08-01 DIAGNOSIS — R0602 Shortness of breath: Secondary | ICD-10-CM | POA: Diagnosis not present

## 2020-08-01 DIAGNOSIS — J45909 Unspecified asthma, uncomplicated: Secondary | ICD-10-CM | POA: Diagnosis not present

## 2020-08-01 DIAGNOSIS — R059 Cough, unspecified: Secondary | ICD-10-CM | POA: Diagnosis present

## 2020-08-01 MED ORDER — ALBUTEROL SULFATE HFA 108 (90 BASE) MCG/ACT IN AERS
2.0000 | INHALATION_SPRAY | Freq: Once | RESPIRATORY_TRACT | Status: AC
  Administered 2020-08-01: 2 via RESPIRATORY_TRACT
  Filled 2020-08-01: qty 6.7

## 2020-08-01 MED ORDER — BENZONATATE 100 MG PO CAPS
100.0000 mg | ORAL_CAPSULE | Freq: Three times a day (TID) | ORAL | 0 refills | Status: DC | PRN
Start: 2020-08-01 — End: 2020-12-31

## 2020-08-01 MED ORDER — ACETAMINOPHEN 325 MG PO TABS
650.0000 mg | ORAL_TABLET | Freq: Once | ORAL | Status: AC
  Administered 2020-08-01: 650 mg via ORAL
  Filled 2020-08-01: qty 2

## 2020-08-01 MED ORDER — GUAIFENESIN 100 MG/5ML PO LIQD: 100.0000 mg | ORAL | 0 refills | Status: DC | PRN

## 2020-08-01 NOTE — Telephone Encounter (Signed)
Agree appropriate to send to ED/Urgent Care

## 2020-08-01 NOTE — ED Provider Notes (Signed)
[facility] EMERGENCY DEPT Provider Note   CSN: [id number] Arrival date & time: 08/01/20  1608     History Chief Complaint  Patient presents with  . Cough    [name] is a 70 y.o. male.  HPI 70 year old male presents with cough and shortness of breath.  Started yesterday morning.  The cough is nonproductive.  Whenever he is coughing he is short of breath but otherwise he is not short of breath.  He thinks he has had a mild fever.  Took some Robitussin last night which did help temporarily.  No leg swelling or chest pain.  Has received 2 COVID vaccinations.  Has an albuterol inhaler but does not feel he gets working very well.  Past Medical History:  Diagnosis Date  . Atrial tachycardia ([place]) 04/11/2012  . CEREBROVASCULAR ACCIDENT 10/17/2008   Acute CVA of right thalamus 10/03/08 ECHO performed 2/2 CVA 10/09/08, EF 55-65%  Aspirin 325 mg daily and try to work on quitting smoking.   . Emphysema    per multiple CXRs  . Hypertension    goal < 140/90  . Sickle cell trait ([place])   . Snoring    Sleep study 09/02/10 was WNL  . Stroke [facility]) 10/03/08   Right thalamus  . Tuberculosis at age 13-3    Patient Active Problem List   Diagnosis Date Noted  . Bilateral flank pain 06/26/2020  . Urethritis 12/27/2019  . De Quervain's tenosynovitis, left 08/10/2019  . Primary osteoarthritis of left knee 07/21/2018  . Primary osteoarthritis of right knee 04/06/2018  . Baker's cyst of knee, left 04/06/2018  . TMJ disease 09/17/2017  . Sickle cell trait ([place])   . Abdominal aortic atherosclerosis ([place]) 04/21/2016  . Urinary frequency 01/04/2015  . Seasonal allergies 06/15/2014  . Renal cyst, right 02/08/2013  . History of tuberculosis 01/19/2013  . Sinoatrial node dysfunction ([facility]) 04/11/2012  . Healthcare maintenance 11/26/2011  . Pacemaker 11/25/2011  . COPD with asthma ([place])   . GERD (gastroesophageal reflux disease) 02/04/2011  . Tobacco abuse 07/01/2010  .  Hyperlipidemia 09/30/2009  . MIGRAINE WITHOUT AURA 09/30/2009  . ERECTILE DYSFUNCTION, ORGANIC 01/16/2009  . Essential hypertension 11/22/2008    Past Surgical History:  Procedure Laterality Date  . APPENDECTOMY  1977  . PACEMAKER INSERTION  07/24/08   MDT Adapta L implanted by Dr Blanch Media  . VASECTOMY  1990       Family History  Problem Relation Age of Onset  . Cancer Sister        Unknown  . Hypertension Mother   . Sickle cell trait Son   . Sickle cell anemia Son     Social History   Tobacco Use  . Smoking status: Current Every Day Smoker    Packs/day: 0.30    Years: 10.00    Pack years: 3.00    Types: Cigarettes  . Smokeless tobacco: Never Used  . Tobacco comment: Cutting back. DOWN TO 3-4 CIAGARETTES  Vaping Use  . Vaping Use: Never used  Substance Use Topics  . Alcohol use: No    Alcohol/week: 0.0 standard drinks  . Drug use: No    Comment: previous polysubstance abuser, quit x 13 years    Home Medications Prior to Admission medications   Medication Sig Start Date End Date Taking? Authorizing Provider  benzonatate (TESSALON) 100 MG capsule Take 1 capsule (100 mg total) by mouth 3 (three) times daily as needed for cough. 08/01/20  Yes Sherwood Gambler, MD  guaiFENesin [facility])  100 MG/5ML liquid Take 5-10 mLs (100-200 mg total) by mouth every 4 (four) hours as needed for cough. 08/01/20  Yes Sherwood Gambler, MD  albuterol (VENTOLIN HFA) 108 (90 Base) MCG/ACT inhaler Inhale 2 puffs into the lungs every 6 (six) hours as needed for wheezing or shortness of breath. 06/26/20   Lacinda Axon, MD  amLODipine-atorvastatin (CADUET) 10-80 MG tablet Take 1 tablet by mouth daily. 06/26/20 12/23/20  Lacinda Axon, MD  aspirin EC 81 MG tablet Take 1 tablet (81 mg total) by mouth at bedtime. 06/26/20 12/23/20  Lacinda Axon, MD  chlorthalidone (HYGROTON) 25 MG tablet Take 1 tablet (25 mg total) by mouth daily. 06/26/20   Lacinda Axon, MD  sildenafil  (VIAGRA) 50 MG tablet Take 1 tablet (50 mg total) by mouth as needed for erectile dysfunction. 12/22/19 12/21/20  [name], DO    Allergies    Ace inhibitors  Review of Systems   Review of Systems  Constitutional: Positive for fever.  HENT: Positive for rhinorrhea. Negative for sore throat.   Respiratory: Positive for cough and shortness of breath.   Cardiovascular: Negative for chest pain and leg swelling.  All other systems reviewed and are negative.   Physical Exam Updated Vital Signs BP (!) 140/93 (BP Location: Right Arm)   Pulse 97   Temp (!) 100.9 F (38.3 C) (Oral)   Ht 6\' 1"  (1.854 m)   Wt 77.1 kg   SpO2 99%   BMI 22.43 kg/m   Physical Exam Vitals and nursing note reviewed.  Constitutional:      General: He is not in acute distress.    Appearance: He is well-developed. He is not ill-appearing or diaphoretic.  HENT:     Head: Normocephalic and atraumatic.     Right Ear: External ear normal.     Left Ear: External ear normal.     Nose: Nose normal.  Eyes:     General:        Right eye: No discharge.        Left eye: No discharge.  Cardiovascular:     Rate and Rhythm: Normal rate and regular rhythm.     Heart sounds: Normal heart sounds.  Pulmonary:     Effort: Pulmonary effort is normal. No tachypnea or accessory muscle usage.     Breath sounds: Normal breath sounds. No wheezing.  Abdominal:     Palpations: Abdomen is soft.     Tenderness: There is no abdominal tenderness.  Musculoskeletal:     Cervical back: Neck supple.  Skin:    General: Skin is warm and dry.  Neurological:     Mental Status: He is alert.  Psychiatric:        Mood and Affect: Mood is not anxious.     ED Results / Procedures / Treatments   Labs (all labs ordered are listed, but only abnormal results are displayed) Labs Reviewed  SARS CORONAVIRUS 2 (TAT 6-24 HRS)    EKG None  Radiology DG Chest Portable 1 View  Result Date: 08/01/2020 CLINICAL DATA:  Cough. EXAM:  PORTABLE CHEST 1 VIEW COMPARISON:  August 08, 2015. FINDINGS: The heart size and mediastinal contours are within normal limits. Left-sided pacemaker is unchanged in position. No pneumothorax or pleural effusion is noted. Emphysematous disease is noted. Hyperexpansion of the lungs is noted. No acute pulmonary disease is noted. The visualized skeletal structures are unremarkable. IMPRESSION: No acute cardiopulmonary abnormality seen. Hyperexpansion of the lungs is noted suggesting  COPD. Emphysema (ICD10-J43.9). Electronically Signed   By: Marijo Conception M.D.   On: 08/01/2020 16:42    Procedures Procedures   Medications Ordered in ED Medications  acetaminophen (TYLENOL) tablet 650 mg (650 mg Oral Given 08/01/20 1641)  albuterol (VENTOLIN HFA) 108 (90 Base) MCG/ACT inhaler 2 puff (2 puffs Inhalation Given by Other 08/01/20 1642)    ED Course  I have reviewed the triage vital signs and the nursing notes.  Pertinent labs & imaging results that were available during my care of the patient were reviewed by me and considered in my medical decision making (see chart for details).    MDM Rules/Calculators/A&P                          Patient feels a little better after some albuterol.  He is mildly febrile here.  Chest x-ray has been personally reviewed and there is no obvious pneumonia.  He has no unilateral abnormal lung sounds.  At this point, his heart rate is normal, no increased work of breathing and no hypoxia.  Probably has a viral URI.  It is possible he has early occult pneumonia but at this point I think he is stable for discharge home with supportive care.  Will test for COVID.  Discussed return precautions. Final Clinical Impression(s) / ED Diagnoses Final diagnoses:  Viral upper respiratory tract infection with cough    Rx / DC Orders ED Discharge Orders         Ordered    benzonatate (TESSALON) 100 MG capsule  3 times daily PRN        08/01/20 1657    guaiFENesin (ROBITUSSIN) 100  MG/5ML liquid  Every 4 hours PRN        08/01/20 1657           Sherwood Gambler, MD 08/01/20 1658

## 2020-08-01 NOTE — Telephone Encounter (Signed)
Returned call to patient. Went straight to VM. Unable to leave message as VMB is full.

## 2020-08-01 NOTE — Discharge Instructions (Addendum)
If you develop high fever, severe cough or cough with blood, trouble breathing, severe headache, neck pain/stiffness, vomiting, or any other new/concerning symptoms then return to the ER for evaluation  

## 2020-08-01 NOTE — ED Triage Notes (Signed)
Needs to cough every time he breathes he states  Feels sob

## 2020-08-01 NOTE — Telephone Encounter (Signed)
Called pt - stated every time he takes a deep breath, he coughs (a dry cough) which started yesterday. Stated he feels it on the left side of chest. C/o sob and chills. Hx COPD. He has been vaccinated. No available appts today nor tomorrow - pt instructed to go to the ED or [facility] which is near his home. Stated he will.

## 2020-08-01 NOTE — Telephone Encounter (Signed)
Pt states he is coughing and having SOB. Please call back.

## 2020-08-02 LAB — SARS CORONAVIRUS 2 (TAT 6-24 HRS): SARS Coronavirus 2: NEGATIVE

## 2020-08-06 ENCOUNTER — Ambulatory Visit
Admission: RE | Admit: 2020-08-06 | Discharge: 2020-08-06 | Disposition: A | Discharge status: Home / Self Care | Payer: Medicare HMO | Source: Ambulatory Visit | Attending: Internal Medicine | Admitting: Internal Medicine

## 2020-08-06 ENCOUNTER — Other Ambulatory Visit: Payer: Self-pay

## 2020-08-06 DIAGNOSIS — Z122 Encounter for screening for malignant neoplasm of respiratory organs: Secondary | ICD-10-CM

## 2020-09-10 ENCOUNTER — Encounter: Payer: Self-pay | Admitting: *Deleted

## 2020-10-01 ENCOUNTER — Ambulatory Visit (INDEPENDENT_AMBULATORY_CARE_PROVIDER_SITE_OTHER): Payer: Medicare HMO

## 2020-10-01 DIAGNOSIS — I495 Sick sinus syndrome: Secondary | ICD-10-CM

## 2020-10-04 LAB — CUP PACEART REMOTE DEVICE CHECK
Battery Impedance: 1813 Ohm
Battery Remaining Longevity: 41 mo
Battery Voltage: 2.76 V
Brady Statistic AP VP Percent: 0 %
Brady Statistic AP VS Percent: 31 %
Brady Statistic AS VP Percent: 0 %
Brady Statistic AS VS Percent: 69 %
Date Time Interrogation Session: 20220728221401
Implantable Lead Implant Date: 20101105
Implantable Lead Implant Date: 20101105
Implantable Lead Location: 753859
Implantable Lead Location: 753860
Implantable Lead Model: 5076
Implantable Lead Model: 5076
Implantable Pulse Generator Implant Date: 20101105
Lead Channel Impedance Value: 413 Ohm
Lead Channel Impedance Value: 428 Ohm
Lead Channel Pacing Threshold Amplitude: 0.875 V
Lead Channel Pacing Threshold Amplitude: 0.875 V
Lead Channel Pacing Threshold Pulse Width: 0.4 ms
Lead Channel Pacing Threshold Pulse Width: 0.4 ms
Lead Channel Setting Pacing Amplitude: 2 V
Lead Channel Setting Pacing Amplitude: 2.5 V
Lead Channel Setting Pacing Pulse Width: 0.4 ms
Lead Channel Setting Sensing Sensitivity: 2.8 mV

## 2020-10-25 NOTE — Progress Notes (Signed)
Remote pacemaker transmission.   

## 2020-11-06 ENCOUNTER — Encounter (HOSPITAL_COMMUNITY): Payer: Self-pay | Admitting: Emergency Medicine

## 2020-11-06 ENCOUNTER — Other Ambulatory Visit: Payer: Self-pay

## 2020-11-06 ENCOUNTER — Emergency Department (HOSPITAL_COMMUNITY): Payer: Medicare HMO

## 2020-11-06 ENCOUNTER — Emergency Department (HOSPITAL_COMMUNITY)
Admission: EM | Admit: 2020-11-06 | Discharge: 2020-11-06 | Disposition: A | Discharge status: Home / Self Care | Payer: Medicare HMO | Attending: Emergency Medicine | Admitting: Emergency Medicine

## 2020-11-06 DIAGNOSIS — Z7951 Long term (current) use of inhaled steroids: Secondary | ICD-10-CM | POA: Diagnosis not present

## 2020-11-06 DIAGNOSIS — I10 Essential (primary) hypertension: Secondary | ICD-10-CM | POA: Insufficient documentation

## 2020-11-06 DIAGNOSIS — R109 Unspecified abdominal pain: Secondary | ICD-10-CM | POA: Diagnosis present

## 2020-11-06 DIAGNOSIS — Z7982 Long term (current) use of aspirin: Secondary | ICD-10-CM | POA: Diagnosis not present

## 2020-11-06 DIAGNOSIS — J45909 Unspecified asthma, uncomplicated: Secondary | ICD-10-CM | POA: Diagnosis not present

## 2020-11-06 DIAGNOSIS — F17210 Nicotine dependence, cigarettes, uncomplicated: Secondary | ICD-10-CM | POA: Insufficient documentation

## 2020-11-06 DIAGNOSIS — J449 Chronic obstructive pulmonary disease, unspecified: Secondary | ICD-10-CM | POA: Insufficient documentation

## 2020-11-06 DIAGNOSIS — Z79899 Other long term (current) drug therapy: Secondary | ICD-10-CM | POA: Diagnosis not present

## 2020-11-06 DIAGNOSIS — K219 Gastro-esophageal reflux disease without esophagitis: Secondary | ICD-10-CM | POA: Diagnosis not present

## 2020-11-06 DIAGNOSIS — Z950 Presence of cardiac pacemaker: Secondary | ICD-10-CM | POA: Diagnosis not present

## 2020-11-06 LAB — CBC
HCT: 36.4 % — ABNORMAL LOW (ref 39.0–52.0)
Hemoglobin: 12.6 g/dL — ABNORMAL LOW (ref 13.0–17.0)
MCH: 30.6 pg (ref 26.0–34.0)
MCHC: 34.6 g/dL (ref 30.0–36.0)
MCV: 88.3 fL (ref 80.0–100.0)
Platelets: 203 10*3/uL (ref 150–400)
RBC: 4.12 MIL/uL — ABNORMAL LOW (ref 4.22–5.81)
RDW: 16.1 % — ABNORMAL HIGH (ref 11.5–15.5)
WBC: 4.7 10*3/uL (ref 4.0–10.5)
nRBC: 0 % (ref 0.0–0.2)

## 2020-11-06 LAB — URINALYSIS, MICROSCOPIC (REFLEX)
Bacteria, UA: NONE SEEN
RBC / HPF: NONE SEEN RBC/hpf (ref 0–5)

## 2020-11-06 LAB — BASIC METABOLIC PANEL
Anion gap: 7 (ref 5–15)
BUN: 8 mg/dL (ref 8–23)
CO2: 27 mmol/L (ref 22–32)
Calcium: 9.2 mg/dL (ref 8.9–10.3)
Chloride: 110 mmol/L (ref 98–111)
Creatinine, Ser: 1 mg/dL (ref 0.61–1.24)
GFR, Estimated: >60 mL/min (ref >60)
Glucose, Bld: 101 mg/dL — ABNORMAL HIGH (ref 70–99)
Potassium: 3.5 mmol/L (ref 3.5–5.1)
Sodium: 144 mmol/L (ref 135–145)

## 2020-11-06 LAB — URINALYSIS, ROUTINE W REFLEX MICROSCOPIC
Bilirubin Urine: NEGATIVE
Glucose, UA: NEGATIVE mg/dL
Hgb urine dipstick: NEGATIVE
Ketones, ur: NEGATIVE mg/dL
Nitrite: NEGATIVE
Protein, ur: NEGATIVE mg/dL
Specific Gravity, Urine: 1.02 (ref 1.005–1.030)
pH: 6 (ref 5.0–8.0)

## 2020-11-06 NOTE — ED Triage Notes (Signed)
Pt complains of left sided flank pain x 1 year off an on. Pain 10/10.

## 2020-11-06 NOTE — Discharge Instructions (Addendum)
Take ibuprofen 600 mg every 6 hours as needed for pain.  Your CT scan today shows an abnormal finding within your liver.  The radiologist has recommended an MRI with and without contrast.  Please schedule this with your primary doctor.  Return to the emergency department in the meantime if you develop worsening pain, high fevers, or other new and concerning symptoms.
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
## 2020-11-06 NOTE — ED Notes (Signed)
Save blue tube in main lab °

## 2020-11-21 ENCOUNTER — Encounter: Payer: Medicare HMO | Admitting: Internal Medicine

## 2020-12-03 ENCOUNTER — Ambulatory Visit (INDEPENDENT_AMBULATORY_CARE_PROVIDER_SITE_OTHER): Payer: Medicare HMO | Admitting: Student

## 2020-12-03 VITALS — BP 141/101 | HR 84 | Temp 98.5°F | Ht 73.0 in | Wt 154.1 lb

## 2020-12-03 DIAGNOSIS — N281 Cyst of kidney, acquired: Secondary | ICD-10-CM | POA: Diagnosis not present

## 2020-12-03 DIAGNOSIS — R059 Cough, unspecified: Secondary | ICD-10-CM

## 2020-12-03 DIAGNOSIS — I10 Essential (primary) hypertension: Secondary | ICD-10-CM | POA: Diagnosis not present

## 2020-12-03 DIAGNOSIS — N529 Male erectile dysfunction, unspecified: Secondary | ICD-10-CM | POA: Diagnosis not present

## 2020-12-03 DIAGNOSIS — K769 Liver disease, unspecified: Secondary | ICD-10-CM

## 2020-12-03 NOTE — Patient Instructions (Addendum)
It was a pleasure seeing you in clinic. Today we discussed:   Liver and renal lesions: You will need a [place] to further evaluate abnormalities found on your recent CT scan  Cough:I expect this will improve with time, make sure you drink plenty of water and rest in the meantime  Sexual dysfunction: I will place a referral to urology to further evaluate this   If you have any questions or concerns, please call our clinic at [phone] between 9am-5pm and after hours call [phone] and ask for the internal medicine resident on call. If you feel you are having a medical emergency please call 911.   Thank you, we look forward to helping you remain healthy!

## 2020-12-04 NOTE — Progress Notes (Signed)
   CC: ED follow-up, abnormal liver lesions, renal cyst, cough  HPI:  [name] is a 70 y.o. male past medical history as listed below presents for follow-up for ED visit on 8/30.  Was having left flank pain at that time which has since resolved.  Patient endorses cough for the past 3 days.  Was noted to have a incidental indeterminate hypodense lesions on the liver on CT renal stone study on 11/05/2020, also noted interval focus of calcification of a known right upper renal cyst. Please refer to problem based charting for further details and assessment and plan of current problem and chronic medical conditions.  Past Medical History:  Diagnosis Date   Atrial tachycardia ([place]) 04/11/2012   CEREBROVASCULAR ACCIDENT 10/17/2008   Acute CVA of right thalamus 10/03/08 ECHO performed 2/2 CVA 10/09/08, EF 55-65%  Aspirin 325 mg daily and try to work on quitting smoking.    Emphysema    per multiple CXRs   Hypertension    goal < 140/90   Sickle cell trait (HCC)    Snoring    Sleep study 09/02/10 was WNL   Stroke [facility]) 10/03/08   Right thalamus   Tuberculosis at age 61-3   Review of Systems: Negative as per HPI  Physical Exam:  Vitals:   12/03/20 1457  BP: (!) 141/101  Pulse: 84  Temp: 98.5 F (36.9 C)  TempSrc: Oral  SpO2: 98%  Weight: 154 lb 1.6 oz (69.9 kg)  Height: 6\' 1"  (1.854 m)   Physical Exam Constitutional:      Appearance: Normal appearance.  HENT:     Head: Atraumatic.     Mouth/Throat:     Mouth: Mucous membranes are moist.     Pharynx: Oropharynx is clear.  Eyes:     Conjunctiva/sclera: Conjunctivae normal.     Pupils: Pupils are equal, round, and reactive to light.  Cardiovascular:     Rate and Rhythm: Normal rate and regular rhythm.  Pulmonary:     Effort: Pulmonary effort is normal.     Breath sounds: Normal breath sounds. No rhonchi or rales.  Abdominal:     General: Abdomen is flat. Bowel sounds are normal. There is no distension.     Palpations:  Abdomen is soft. There is no mass.     Tenderness: There is no abdominal tenderness.  Musculoskeletal:     Right lower leg: No edema.     Left lower leg: No edema.  Skin:    General: Skin is warm and dry.     Capillary Refill: Capillary refill takes less than 2 seconds.  Neurological:     General: No focal deficit present.     Mental Status: He is alert and oriented to person, place, and time.  Psychiatric:        Mood and Affect: Mood normal.        Behavior: Behavior normal.    Assessment & Plan:   See Encounters Tab for problem based charting.  Patient discussed with Dr. Philipp Ovens

## 2020-12-06 DIAGNOSIS — R059 Cough, unspecified: Secondary | ICD-10-CM | POA: Insufficient documentation

## 2020-12-06 DIAGNOSIS — K769 Liver disease, unspecified: Secondary | ICD-10-CM | POA: Insufficient documentation

## 2020-12-06 NOTE — Assessment & Plan Note (Addendum)
Patient with history of complex right upper pole renal cyst seen on CT imaging since 2014.  On CT renal stone study in August 2022 noted to have new calcification of the cyst.  Recommended further characterization with MRI however patient's pacemaker is incompatible with MRI.  We will get dedicated renal ultrasound to further evaluate this.  Renal ultrasound  Addendum: [place] RUQ 11/3: 3.2 x 2.8 x 2.4 cm septated cyst in the upper pole the right kidney with thickened septation measuring 6 mm. Discussed with radiology Dr. Reymundo Poll due to patient having incompatible pacemaker. Recommended follow up with contrast CT.   CT abdomen w/ contrast ordered. Attempted to call to discuss results an plan for further imaging, but was unable to reach, VM full. Will continue to try to contact patient.

## 2020-12-06 NOTE — Assessment & Plan Note (Signed)
Patient notes cough the past 3 days.  Produces small amount of yellow sputum with the cough.  He denies fevers, chills, dyspnea, myalgias.  States he feels that this cough is improved the past 2 days.  Suspect this may be a viral URI.  Counseled patient that this will likely improve on its own.   Continue supportive measures Follow-up if worsen or do not improve

## 2020-12-06 NOTE — Assessment & Plan Note (Signed)
Patient with history of erectile dysfunction.  Notes he has difficulty maintaining erection while in he is on his blood pressure medications.  He previously took sildenafil 10 mg without much effect and was increased to 50 mg as needed however he never picked up the prescription due to cost.  He denies symptoms of leg claudication.  On exam bilateral lower extremities are warm, DP and PT pulses are 2+ and appear well-perfused.  Recommended referral to urology to further work-up his ED.  Referral to urology

## 2020-12-06 NOTE — Assessment & Plan Note (Signed)
BP 141/100 in office today.  States he has not been taking his blood pressure meds as they cause sexual dysfunction.  He takes amlodipine-atorvastatin 10-80 mg daily and chlorthalidone 25 mg daily.  States he has not taken his blood pressure medicines in a couple months due to the side effects.  Discussed the importance of good blood pressure control.  See sexual dysfunction dysfunction for further details.  Plan Restart amlodipine-atorvastatin 10-80 mg daily Restart chlorthalidone 25 mg daily Follow-up blood pressure in 2 to 4 weeks
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
## 2020-12-09 NOTE — Addendum Note (Signed)
Addended by: Iona Beard on: 12/09/2020 12:08 PM   Modules accepted: Orders

## 2020-12-10 ENCOUNTER — Ambulatory Visit (HOSPITAL_COMMUNITY): Payer: Medicare HMO

## 2020-12-10 ENCOUNTER — Ambulatory Visit (HOSPITAL_COMMUNITY): Admission: RE | Admit: 2020-12-10 | Payer: Medicare HMO | Source: Ambulatory Visit

## 2020-12-12 NOTE — Progress Notes (Signed)
Internal Medicine Clinic Attending ? ?Case discussed with Dr. Liang  At the time of the visit.  We reviewed the resident?s history and exam and pertinent patient test results.  I agree with the assessment, diagnosis, and plan of care documented in the resident?s note. ? ?

## 2020-12-16 NOTE — Progress Notes (Signed)
Internal Medicine Clinic Attending ? ?Case discussed with Dr. Liang  At the time of the visit.  We reviewed the resident?s history and exam and pertinent patient test results.  I agree with the assessment, diagnosis, and plan of care documented in the resident?s note. ? ?

## 2020-12-24 ENCOUNTER — Ambulatory Visit (HOSPITAL_COMMUNITY): Admission: RE | Admit: 2020-12-24 | Payer: Medicare HMO | Source: Ambulatory Visit

## 2020-12-24 ENCOUNTER — Ambulatory Visit (HOSPITAL_COMMUNITY): Payer: Medicare HMO

## 2020-12-26 ENCOUNTER — Telehealth: Payer: Self-pay | Admitting: *Deleted

## 2020-12-26 ENCOUNTER — Encounter: Payer: Medicare HMO | Admitting: Student

## 2020-12-26 NOTE — Telephone Encounter (Signed)
CALLED PATIENT REGARDING HIS MISSED APPOINTMENT/ UNABLE TO LEAVE MESSAGE DUE TO MAIL BOX FULL.

## 2020-12-30 ENCOUNTER — Encounter: Payer: Self-pay | Admitting: Student

## 2020-12-30 ENCOUNTER — Other Ambulatory Visit: Payer: Self-pay

## 2020-12-30 ENCOUNTER — Ambulatory Visit (INDEPENDENT_AMBULATORY_CARE_PROVIDER_SITE_OTHER): Payer: Medicare HMO | Admitting: Student

## 2020-12-30 VITALS — BP 144/101 | HR 77 | Temp 98.2°F | Ht 73.0 in | Wt 154.9 lb

## 2020-12-30 DIAGNOSIS — Z0000 Encounter for general adult medical examination without abnormal findings: Secondary | ICD-10-CM | POA: Diagnosis not present

## 2020-12-30 DIAGNOSIS — Z23 Encounter for immunization: Secondary | ICD-10-CM | POA: Diagnosis not present

## 2020-12-30 DIAGNOSIS — L299 Pruritus, unspecified: Secondary | ICD-10-CM

## 2020-12-30 DIAGNOSIS — I10 Essential (primary) hypertension: Secondary | ICD-10-CM | POA: Diagnosis not present

## 2020-12-30 DIAGNOSIS — N281 Cyst of kidney, acquired: Secondary | ICD-10-CM

## 2020-12-30 DIAGNOSIS — K769 Liver disease, unspecified: Secondary | ICD-10-CM | POA: Diagnosis not present

## 2020-12-30 MED ORDER — AMLODIPINE-ATORVASTATIN 10-80 MG PO TABS: 1.0000 | ORAL_TABLET | Freq: Every day | ORAL | 3 refills | Status: AC

## 2020-12-30 MED ORDER — CHLORTHALIDONE 25 MG PO TABS: 25.0000 mg | ORAL_TABLET | Freq: Every day | ORAL | 3 refills | Status: DC

## 2020-12-30 NOTE — Progress Notes (Signed)
   CC: Whole body itching  HPI:  [name] is a 70 y.o. male with PMH as below who presents to the clinic for evaluation of itching. Please see problem based charting for evaluation, assessment and plan.  Past Medical History:  Diagnosis Date   Atrial tachycardia ([place]) 04/11/2012   CEREBROVASCULAR ACCIDENT 10/17/2008   Acute CVA of right thalamus 10/03/08 ECHO performed 2/2 CVA 10/09/08, EF 55-65%  Aspirin 325 mg daily and try to work on quitting smoking.    Emphysema    per multiple CXRs   Hypertension    goal < 140/90   Sickle cell trait (HCC)    Snoring    Sleep study 09/02/10 was WNL   Stroke [facility]) 10/03/08   Right thalamus   Tuberculosis at age 7-3    Review of Systems:  Constitutional: Negative for fever or fatigue Eyes: Negative for visual changes Respiratory: Negative for shortness of breath Cardiac: Negative for chest pain Skin: Positive for itching.  Negative for rash Abdomen: Negative for abdominal pain, constipation or diarrhea  Physical Exam: General: Pleasant, well developed and well dressed elderly male. No acute distress. Cardiac: RRR. No murmurs, rubs or gallops. No LE edema Respiratory: Lungs CTAB. No wheezing or crackles. Abdominal: Soft, symmetric and non tender. Normal BS. Skin: Warm. Skin is dry. No evidence of rash or lesions. Extremities: Atraumatic. Full ROM. Palpable radial and DP pulses. Neuro: A&O x 3. Moves all extremities  Vitals:   12/30/20 1101  BP: (!) 144/101  Pulse: 77  Temp: 98.2 F (36.8 C)  TempSrc: Oral  SpO2: 100%  Weight: 154 lb 14.4 oz (70.3 kg)  Height: 6\' 1"  (1.854 m)     Assessment & Plan:   See Encounters Tab for problem based charting.  Patient discussed with Dr. Gardiner Sleeper, MD, MPH

## 2020-12-30 NOTE — Patient Instructions (Signed)
Thank you, [name] for allowing [place] to provide your care today. Today we discussed your blood pressure and itching.  For your blood pressure, we are refilling your medications.  Take this up at your pharmacy.  For the itching, we want you to try hypoallergenic items Change your current detergent, body wash and lotions and monitor your symptoms. Start using moisturizing cream such as CeraVe moisturizing cream and avoid lotions as they can dry out your skin.  Make sure to call the imaging center to get your   I have ordered the following labs for you:   Lab Orders         CMP14 + Anion Gap      I will call if any are abnormal. All of your labs can be accessed through "My Chart".   My Chart Access: [email or web address]  Please follow-up in 3 months or if symptoms worsens  Please make sure to arrive 15 minutes prior to your next appointment. If you arrive late, you may be asked to reschedule.    We look forward to seeing you next time. Please call our clinic at [phone] if you have any questions or concerns. The best time to call is Monday-Friday from 9am-4pm, but there is someone available 24/7. If after hours or the weekend, call the main hospital number and ask for the Internal Medicine Resident On-Call. If you need medication refills, please notify your pharmacy one week in advance and they will send [place] a request.   Thank you for letting us take part in your care. Wishing you the best!  Lacinda Axon, MD 12/30/2020, 11:45 AM IM Resident, PGY-2 [facility] 41:10

## 2020-12-31 ENCOUNTER — Ambulatory Visit (INDEPENDENT_AMBULATORY_CARE_PROVIDER_SITE_OTHER): Payer: Medicare HMO

## 2020-12-31 ENCOUNTER — Encounter: Payer: Self-pay | Admitting: Student

## 2020-12-31 DIAGNOSIS — I495 Sick sinus syndrome: Secondary | ICD-10-CM | POA: Diagnosis not present

## 2020-12-31 DIAGNOSIS — L299 Pruritus, unspecified: Secondary | ICD-10-CM | POA: Insufficient documentation

## 2020-12-31 NOTE — Assessment & Plan Note (Signed)
Received flu shot today. 

## 2020-12-31 NOTE — Assessment & Plan Note (Signed)
Patient reports he missed the appointment for his right upper quadrant ultrasound. -- Advised to call imaging to reschedule

## 2020-12-31 NOTE — Assessment & Plan Note (Signed)
Again, patient missed his appointment to get this ultrasound done. -- Advised to call the imaging center to get a new appointment.

## 2020-12-31 NOTE — Assessment & Plan Note (Signed)
BP still not at goal. Patient has been inconsistent with taking his medications. He denies any headaches, dizziness or blurry vision. Counseled on the importance of taking all his medications as prescribed. Kidney function normal on last BMP. -- Refilled amlodipine-atorvastatin 10-80 mg daily -- Continue chlorthalidone 25 mg daily -- Follow-up in 4 weeks for reevaluation

## 2020-12-31 NOTE — Assessment & Plan Note (Signed)
Patient presents to clinic for evaluation of intermittent itching for the last 5 to 6 months.  Patient reports that he has occasional whole body itching that sometimes improves with Benadryl. He has not identified any particular triggers for his itching. He denies any and itching with hot showers. Denies any rash, abdominal pain or new medication, detergent, lotions or body wash.  No recent hiking or exposure to poison ivy.  On exam, patient's body is dry but there is no rash.   A/P: 70 year old elderly male presenting for evaluation of whole body itching. Skin exam is unremarkable. He has not changed his lotion, body wash or detergent in the last few months. Unclear the main cause of patient's itching but the differential includes pruritic dermatitis, atopic dermatitis or allergic contact dermatitis. Primary sclerosing cholangitis is also a possibility but lower on the differential because of patient's age and history of smoking. --Patient advised to discontinue use of current detergent, body wash, lotions and start using hypoallergenic products such as CeraVe moisturizing cream.  Wife was called and updated on recommendations. -- Follow-up CMP, patient forgot to get lab work done, patient was called to schedule lab appt on same day of imaging study -- Follow-up US RUQ, limited -- Follow-up as needed if symptoms worsen or do not resolve after few weeks.

## 2021-01-01 LAB — CUP PACEART REMOTE DEVICE CHECK
Battery Impedance: 1924 Ohm
Battery Remaining Longevity: 39 mo
Battery Voltage: 2.76 V
Brady Statistic AP VP Percent: 0 %
Brady Statistic AP VS Percent: 31 %
Brady Statistic AS VP Percent: 0 %
Brady Statistic AS VS Percent: 69 %
Date Time Interrogation Session: 20221026121745
Implantable Lead Implant Date: 20101105
Implantable Lead Implant Date: 20101105
Implantable Lead Location: 753859
Implantable Lead Location: 753860
Implantable Lead Model: 5076
Implantable Lead Model: 5076
Implantable Pulse Generator Implant Date: 20101105
Lead Channel Impedance Value: 457 Ohm
Lead Channel Impedance Value: 478 Ohm
Lead Channel Pacing Threshold Amplitude: 0.75 V
Lead Channel Pacing Threshold Amplitude: 0.875 V
Lead Channel Pacing Threshold Pulse Width: 0.4 ms
Lead Channel Pacing Threshold Pulse Width: 0.4 ms
Lead Channel Setting Pacing Amplitude: 2 V
Lead Channel Setting Pacing Amplitude: 2.5 V
Lead Channel Setting Pacing Pulse Width: 0.4 ms
Lead Channel Setting Sensing Sensitivity: 4 mV

## 2021-01-08 NOTE — Progress Notes (Signed)
Remote pacemaker transmission.   

## 2021-01-09 ENCOUNTER — Ambulatory Visit (HOSPITAL_COMMUNITY)
Admission: RE | Admit: 2021-01-09 | Discharge: 2021-01-09 | Disposition: A | Discharge status: Home / Self Care | Payer: Medicare HMO | Source: Ambulatory Visit | Attending: Internal Medicine | Admitting: Internal Medicine

## 2021-01-09 ENCOUNTER — Other Ambulatory Visit: Payer: Self-pay

## 2021-01-09 ENCOUNTER — Other Ambulatory Visit (INDEPENDENT_AMBULATORY_CARE_PROVIDER_SITE_OTHER): Payer: Medicare HMO

## 2021-01-09 DIAGNOSIS — K769 Liver disease, unspecified: Secondary | ICD-10-CM | POA: Diagnosis present

## 2021-01-09 DIAGNOSIS — N281 Cyst of kidney, acquired: Secondary | ICD-10-CM | POA: Insufficient documentation

## 2021-01-09 DIAGNOSIS — I10 Essential (primary) hypertension: Secondary | ICD-10-CM

## 2021-01-11 LAB — CMP14 + ANION GAP
ALT: 17 IU/L (ref 0–44)
AST: 26 IU/L (ref 0–40)
Albumin/Globulin Ratio: 1.6 (ref 1.2–2.2)
Albumin: 4.4 g/dL (ref 3.7–4.7)
Alkaline Phosphatase: 132 IU/L — ABNORMAL HIGH (ref 44–121)
Anion Gap: 17 mmol/L (ref 10.0–18.0)
BUN/Creatinine Ratio: 9 — ABNORMAL LOW (ref 10–24)
BUN: 9 mg/dL (ref 8–27)
Bilirubin Total: 0.5 mg/dL (ref 0.0–1.2)
CO2: 23 mmol/L (ref 20–29)
Calcium: 9.6 mg/dL (ref 8.6–10.2)
Chloride: 100 mmol/L (ref 96–106)
Creatinine, Ser: 0.97 mg/dL (ref 0.76–1.27)
Globulin, Total: 2.7 g/dL (ref 1.5–4.5)
Glucose: 86 mg/dL (ref 70–99)
Potassium: 4.6 mmol/L (ref 3.5–5.2)
Sodium: 140 mmol/L (ref 134–144)
Total Protein: 7.1 g/dL (ref 6.0–8.5)
eGFR: 83 mL/min/{1.73_m2} (ref >59)

## 2021-01-14 ENCOUNTER — Other Ambulatory Visit: Payer: Self-pay | Admitting: Student

## 2021-01-14 DIAGNOSIS — N281 Cyst of kidney, acquired: Secondary | ICD-10-CM

## 2021-01-15 NOTE — Progress Notes (Signed)
Internal Medicine Clinic Attending  Case discussed with Dr. [place]  At the time of the visit.  We reviewed the resident's history and exam and pertinent patient test results.  I agree with the assessment, diagnosis, and plan of care documented in the resident's note.  

## 2021-01-28 ENCOUNTER — Encounter: Payer: Self-pay | Admitting: Internal Medicine

## 2021-01-28 ENCOUNTER — Other Ambulatory Visit: Payer: Self-pay

## 2021-01-28 ENCOUNTER — Ambulatory Visit (INDEPENDENT_AMBULATORY_CARE_PROVIDER_SITE_OTHER): Payer: Medicare HMO | Admitting: Internal Medicine

## 2021-01-28 VITALS — BP 134/87 | HR 69 | Temp 97.5°F | Resp 18 | Ht 73.0 in | Wt 154.3 lb

## 2021-01-28 DIAGNOSIS — R634 Abnormal weight loss: Secondary | ICD-10-CM | POA: Diagnosis not present

## 2021-01-28 DIAGNOSIS — L299 Pruritus, unspecified: Secondary | ICD-10-CM

## 2021-01-28 MED ORDER — CETIRIZINE HCL 10 MG PO TABS: 10.0000 mg | ORAL_TABLET | Freq: Every day | ORAL | 2 refills | Status: DC

## 2021-01-28 NOTE — Patient Instructions (Addendum)
It was nice seeing you today! Thank you for choosing [facility] for your Primary Care.    Today we talked about:   Generalized Itching: I am unsure what is causing your symptoms. We will check your thyroid today. For symptoms, I would stop Benadryl and start a daily Zytrec.   Weight Loss: I have placed a referral to the stomach doctors for a colonoscopy. I am also going to check prostate numbers.

## 2021-01-29 DIAGNOSIS — R634 Abnormal weight loss: Secondary | ICD-10-CM | POA: Insufficient documentation

## 2021-01-29 LAB — TSH: TSH: 3.08 u[IU]/mL (ref 0.450–4.500)

## 2021-01-29 LAB — PSA: Prostate Specific Ag, Serum: 0.8 ng/mL (ref 0.0–4.0)

## 2021-01-29 NOTE — Progress Notes (Signed)
   CC: Itching  HPI:  [name] is a 70 y.o. with a PMHx as listed below who presents to the clinic for itching.   Please see the Encounters tab for problem-based Assessment & Plan regarding status of patient's acute and chronic conditions.  Past Medical History:  Diagnosis Date   Atrial tachycardia ([place]) 04/11/2012   CEREBROVASCULAR ACCIDENT 10/17/2008   Acute CVA of right thalamus 10/03/08 ECHO performed 2/2 CVA 10/09/08, EF 55-65%  Aspirin 325 mg daily and try to work on quitting smoking.    Emphysema    per multiple CXRs   Hypertension    goal < 140/90   Sickle cell trait (HCC)    Snoring    Sleep study 09/02/10 was WNL   Stroke [facility]) 10/03/08   Right thalamus   Tuberculosis at age 102-3   Review of Systems: Review of Systems  Constitutional:  Positive for weight loss (unintentional). Negative for chills and fever.  Respiratory:  Negative for cough and shortness of breath.   Cardiovascular:  Negative for chest pain, palpitations and leg swelling.  Gastrointestinal:  Negative for abdominal pain, diarrhea, nausea and vomiting.  Skin:  Positive for itching. Negative for rash.  Neurological:  Negative for dizziness, focal weakness, weakness and headaches.   Physical Exam:  Vitals:   01/28/21 0903  BP: 134/87  Pulse: 69  Resp: 18  Temp: (!) 97.5 F (36.4 C)  SpO2: 100%  Weight: 154 lb 4.8 oz (70 kg)  Height: 6\' 1"  (1.854 m)   Physical Exam Vitals and nursing note reviewed.  Constitutional:      General: He is not in acute distress.    Appearance: He is normal weight.  HENT:     Head: Normocephalic and atraumatic.  Eyes:     General: No scleral icterus. Cardiovascular:     Rate and Rhythm: Normal rate and regular rhythm.     Heart sounds: No murmur heard. Pulmonary:     Effort: Pulmonary effort is normal. No respiratory distress.     Breath sounds: Normal breath sounds. No wheezing or rales.  Abdominal:     General: Bowel sounds are normal. There is no  distension.     Palpations: Abdomen is soft.     Tenderness: There is no abdominal tenderness.  Skin:    General: Skin is warm and dry.     Coloration: Skin is not ashen or jaundiced.     Findings: No bruising, erythema, lesion or rash.  Neurological:     General: No focal deficit present.     Mental Status: He is alert and oriented to person, place, and time. Mental status is at baseline.  Psychiatric:        Mood and Affect: Mood normal.        Behavior: Behavior normal.    Assessment & Plan:   See Encounters Tab for problem based charting.  Patient discussed with Dr. Jimmye Norman

## 2021-01-29 NOTE — Assessment & Plan Note (Signed)
[name] states that he continues to have generalized itching all over his body that is persistent throughout the day and nighttime.  He does notice it more at night.  He feels like it starts mostly in his lower half of his body and spreads upwards.  He denies any skin changes.  He checked with his wife and they have not changed any detergents or lotions.  He has been taking Benadryl regularly with great improvement in his symptoms but dislikes how sleepy it makes him.  Assessment/plan: Work-up so far has been unrevealing.  Liver disease, biliary and kidney disease have been ruled out.  We will check a TSH today.  - TSH pending - Start daily Zyrtec 10 mg

## 2021-01-29 NOTE — Assessment & Plan Note (Signed)
Mr. Barreira states that he has been experiencing a decreased appetite over the past year or so.  Due to this, he has lost some weight.  Other than itching, he denies any acute complaints at this time, including fever, chills, chest pain, shortness of breath, bowel movement changes, joint or bony pain, night sweats.  Assessment/plan: Approximately 15-20 lb weight loss over the past 1.5 years. Given slow progression, will pursue age-appropriate cancer screening. Patient recently had lung cancer screening that was negative. He is due for colonoscopy and is interested in a PSA today.   - Referral to GI for colonoscopy placed - PSA pending.

## 2021-02-03 NOTE — Progress Notes (Signed)
Internal Medicine Clinic Attending  Case discussed with Dr. Basaraba  At the time of the visit.  We reviewed the resident's history and exam and pertinent patient test results.  I agree with the assessment, diagnosis, and plan of care documented in the resident's note.  

## 2021-02-05 NOTE — Progress Notes (Signed)
TSH and PSA are WNL. Attempted to contact patient to relay results. Unable to leave VM as mailbox was full.

## 2021-02-10 ENCOUNTER — Encounter (HOSPITAL_COMMUNITY): Payer: Self-pay | Admitting: Radiology

## 2021-04-14 ENCOUNTER — Encounter: Payer: Self-pay | Admitting: Internal Medicine

## 2021-05-19 ENCOUNTER — Encounter: Payer: Self-pay | Admitting: Internal Medicine

## 2021-06-30 ENCOUNTER — Telehealth: Payer: Self-pay | Admitting: *Deleted

## 2021-06-30 NOTE — Telephone Encounter (Signed)
Call from [place] NP with [facility] who's currently at the pt's home; reporting 2 findings. A1C is 6.3 and BP 170/108, 168/98. Pt has not taken his BP meds x 3 months; pt did not explain why he's not taking his med - informed pt he does have refills. ?Thanks ?

## 2021-10-16 ENCOUNTER — Other Ambulatory Visit (HOSPITAL_COMMUNITY): Payer: Self-pay

## 2021-10-16 ENCOUNTER — Ambulatory Visit (INDEPENDENT_AMBULATORY_CARE_PROVIDER_SITE_OTHER): Payer: Medicare Other | Admitting: Internal Medicine

## 2021-10-16 ENCOUNTER — Other Ambulatory Visit: Payer: Self-pay

## 2021-10-16 ENCOUNTER — Encounter: Payer: Self-pay | Admitting: Internal Medicine

## 2021-10-16 VITALS — BP 160/106 | HR 62 | Temp 97.9°F | Ht 73.0 in | Wt 146.1 lb

## 2021-10-16 DIAGNOSIS — D573 Sickle-cell trait: Secondary | ICD-10-CM

## 2021-10-16 DIAGNOSIS — I495 Sick sinus syndrome: Secondary | ICD-10-CM

## 2021-10-16 DIAGNOSIS — G8929 Other chronic pain: Secondary | ICD-10-CM

## 2021-10-16 DIAGNOSIS — M5136 Other intervertebral disc degeneration, lumbar region: Secondary | ICD-10-CM

## 2021-10-16 DIAGNOSIS — N2889 Other specified disorders of kidney and ureter: Secondary | ICD-10-CM

## 2021-10-16 DIAGNOSIS — Z720 Tobacco use: Secondary | ICD-10-CM

## 2021-10-16 DIAGNOSIS — N281 Cyst of kidney, acquired: Secondary | ICD-10-CM

## 2021-10-16 DIAGNOSIS — F17210 Nicotine dependence, cigarettes, uncomplicated: Secondary | ICD-10-CM

## 2021-10-16 DIAGNOSIS — J449 Chronic obstructive pulmonary disease, unspecified: Secondary | ICD-10-CM | POA: Diagnosis not present

## 2021-10-16 DIAGNOSIS — R634 Abnormal weight loss: Secondary | ICD-10-CM | POA: Diagnosis not present

## 2021-10-16 DIAGNOSIS — I10 Essential (primary) hypertension: Secondary | ICD-10-CM

## 2021-10-16 DIAGNOSIS — M545 Low back pain, unspecified: Secondary | ICD-10-CM | POA: Diagnosis not present

## 2021-10-16 DIAGNOSIS — I700 Atherosclerosis of aorta: Secondary | ICD-10-CM

## 2021-10-16 DIAGNOSIS — M51369 Other intervertebral disc degeneration, lumbar region without mention of lumbar back pain or lower extremity pain: Secondary | ICD-10-CM | POA: Insufficient documentation

## 2021-10-16 DIAGNOSIS — N529 Male erectile dysfunction, unspecified: Secondary | ICD-10-CM

## 2021-10-16 MED ORDER — SILDENAFIL CITRATE 100 MG PO TABS
50.0000 mg | ORAL_TABLET | ORAL | 2 refills | Status: DC | PRN
  Filled 2021-10-16: qty 10, 30d supply, fill #0

## 2021-10-16 MED ORDER — VARENICLINE TARTRATE 1 MG PO TABS: 1.0000 mg | ORAL_TABLET | Freq: Two times a day (BID) | ORAL | 1 refills | Status: DC

## 2021-10-16 MED ORDER — VARENICLINE TARTRATE 0.5 MG PO TABS: ORAL_TABLET | ORAL | 0 refills | Status: AC

## 2021-10-16 NOTE — Patient Instructions (Signed)
I want you to start Chantix and pick a quit date!  I am sending you to physical therapy for your back pain.  I am getting some blood test for you.    I want you to get your Colonoscopy with Dr Benson Norway and your CT scan of your lungs.

## 2021-10-16 NOTE — Assessment & Plan Note (Signed)
History is importance of adherence to antihypertensive therapy and that he needs to take this all the time even if he is not feeling bad.  He denies any side effects from his antihypertensives and says he will remain on them.  We will have him follow-up within the month to recheck.

## 2021-10-16 NOTE — Assessment & Plan Note (Signed)
Overall it seems like mainly lack of appetite he really was not taking a lot of medications to suspect medication induced decrease in appetite.  Given his weight loss though I want to get him up-to-date on his cancer screening.  I will also obtain a CMP CBC sed rate CRP and urinalysis.  If he is up-to-date on the screening test and no significant abnormalities found we may consider a appetite stimulant

## 2021-10-16 NOTE — Assessment & Plan Note (Signed)
Discussed importance of getting blood pressure control.  He will remain on his medications and I have prescribed sildenafil which I suspect will be cheaper by sending to the outpatient pharmacy.

## 2021-10-16 NOTE — Progress Notes (Signed)
Established Patient Office Visit  Subjective   Patient ID: [name], male    DOB: [birth year]  Age: 71 y.o. MRN: [id number]  Chief Complaint  Patient presents with   Check-up Visit    Back pain.  [name] presents today for follow-up of high blood pressure and low back pain.  As far as his blood pressure is elevated today he reports he really has not taken any of his antihypertensives all summer but started back maybe a few days ago in preparation for this visit.  He knows his blood pressure has been high because he gets slightly blurry vision when his blood pressure is elevated which she is having.  For his chronic low back pain he notes that he has degenerative disc disease.  Back pain is low created in the lower lumbar spine midline does not radiate.  Worse with movement.  No fever or chills.  No weakness.  Not taking any over-the-counter medications he knows this would not be a good idea given his history of stroke and overall does not like medications for his pain.  He is still trying to cut down on smoking but smoking several cigarettes he has a 50+ pack year history and currently smoking about half pack a day.  He is concerned that he has had a decreased appetite over the past several months he has had ongoing weight loss.  He has been contacted by Dr. Ulyses Amor office that he is due for repeat screening colonoscopy.  He has been getting low-dose lung CTs last 1 was little over a year ago.  He is wondering if he can be put on medication for appetite stimulation.  He is asking about a refill of Viagra he reports that 50 mg of Viagra was effective just very expensive.     Objective:     BP (!) 160/106 (BP Location: Right Arm, Cuff Size: Normal)   Pulse 62   Temp 97.9 F (36.6 C) (Oral)   Ht 6' 1"  (1.854 m)   Wt 146 lb 1.6 oz (66.3 kg)   SpO2 100% Comment: RA  BMI 19.28 kg/m  BP Readings from Last 3 Encounters:  10/16/21 (!) 160/106  01/28/21 134/87  12/30/20 (!)  144/101   Wt Readings from Last 3 Encounters:  10/16/21 146 lb 1.6 oz (66.3 kg)  01/28/21 154 lb 4.8 oz (70 kg)  12/30/20 154 lb 14.4 oz (70.3 kg)      Physical Exam Vitals and nursing note reviewed.  Cardiovascular:     Rate and Rhythm: Normal rate and regular rhythm.     Heart sounds: Normal heart sounds.  Pulmonary:     Effort: Pulmonary effort is normal.     Breath sounds: Normal breath sounds. No wheezing.  Abdominal:     General: Abdomen is flat.     Palpations: Abdomen is soft.  Musculoskeletal:     Comments: Tenderness around L5-S1 area, full range of motion.  Psychiatric:        Mood and Affect: Mood normal.        Behavior: Behavior normal.      No results found for any visits on 10/16/21.  Last CBC Lab Results  Component Value Date   WBC 4.7 11/06/2020   HGB 12.6 (L) 11/06/2020   HCT 36.4 (L) 11/06/2020   MCV 88.3 11/06/2020   MCH 30.6 11/06/2020   RDW 16.1 (H) 11/06/2020   PLT 203 25/95/6387   Last metabolic panel Lab Results  Component Value Date  GLUCOSE 86 01/09/2021   NA 140 01/09/2021   K 4.6 01/09/2021   CL 100 01/09/2021   CO2 23 01/09/2021   BUN 9 01/09/2021   CREATININE 0.97 01/09/2021   EGFR 83 01/09/2021   CALCIUM 9.6 01/09/2021   PROT 7.1 01/09/2021   ALBUMIN 4.4 01/09/2021   LABGLOB 2.7 01/09/2021   AGRATIO 1.6 01/09/2021   BILITOT 0.5 01/09/2021   ALKPHOS 132 (H) 01/09/2021   AST 26 01/09/2021   ALT 17 01/09/2021   ANIONGAP 7 11/06/2020      The ASCVD Risk score (Arnett DK, et al., 2019) failed to calculate for the following reasons:   Cannot find a previous HDL lab   Cannot find a previous total cholesterol lab    Assessment & Plan:   Problem List Items Addressed This Visit       Cardiovascular and Mediastinum   Sinoatrial node dysfunction ([place]) (Chronic)    He has a PPM in place, denies any issues, HR normal today.      Relevant Medications   sildenafil (VIAGRA) 100 MG tablet   Essential hypertension  (Chronic)    History is importance of adherence to antihypertensive therapy and that he needs to take this all the time even if he is not feeling bad.  He denies any side effects from his antihypertensives and says he will remain on them.  We will have him follow-up within the month to recheck.      Relevant Medications   sildenafil (VIAGRA) 100 MG tablet   Abdominal aortic atherosclerosis (HCC) (Chronic)     Discussed the importance of taking his cholesterol medication every day he will restart on this and we can check his lipid panel at the next visit      Relevant Medications   sildenafil (VIAGRA) 100 MG tablet     Respiratory   COPD with asthma ([place]) (Chronic)    Has a history of severe emphysema with asthma.  Reports he is using his inhalers does get short of breath occasionally but no issues today.  He is interested in quitting smoking and I have prescribed chantix      Relevant Medications   varenicline (CHANTIX) 0.5 MG tablet   varenicline (CHANTIX CONTINUING MONTH PAK) 1 MG tablet (Start on 11/13/2021)     Musculoskeletal and Integument   Degenerative disc disease, lumbar    His chronic low back pain I suspect is due to degenerative disc disease he has previous imaging from 2011 showing some degeneration of the L5-S1 area which is consistent with where his pain is.  No radicular symptoms or weakness.  Discussed with him starting with a physical therapy referral which he is agreeable with.        Genitourinary   Renal cyst, right (Chronic)    He has had a right renal cyst dating back to 2014.  It was reimaged on ultrasound in late 2022 and was not changed in size although radiology did noted a thickened septation and recommended to consider a MRI.  Given his ongoing weight loss I do not want to leave any stones unturned and will order this MRI.  He does have a pacemaker I am not exactly sure about its compatibility but have noted this in the order if it is incompatible then we  will plan to discuss with radiology about best alternative testing.        Other   Tobacco abuse (Chronic)    Instructed to start running clean and set quit  date in about 1 week.      Relevant Orders   CT CHEST LUNG CA SCREEN LOW DOSE W/O CM   Sickle cell trait (HCC) (Chronic)    Present and stable.      ERECTILE DYSFUNCTION, ORGANIC (Chronic)    Discussed importance of getting blood pressure control.  He will remain on his medications and I have prescribed sildenafil which I suspect will be cheaper by sending to the outpatient pharmacy.      Unintentional weight loss - Primary    Overall it seems like mainly lack of appetite he really was not taking a lot of medications to suspect medication induced decrease in appetite.  Given his weight loss though I want to get him up-to-date on his cancer screening.  I will also obtain a CMP CBC sed rate CRP and urinalysis.  If he is up-to-date on the screening test and no significant abnormalities found we may consider a appetite stimulant      Relevant Orders   CMP14 + Anion Gap   CBC no Diff   Sed Rate (ESR)   CRP (C-Reactive Protein)   Urinalysis, Reflex Microscopic   Other Visit Diagnoses     Chronic midline low back pain without sciatica       Relevant Orders   Ambulatory referral to Physical Therapy   Other specified disorders of kidney and ureter       Relevant Orders   MR Abdomen W Wo Contrast       Return in about 4 weeks (around 11/13/2021).    [name], DO

## 2021-10-16 NOTE — Assessment & Plan Note (Signed)
Instructed to start running clean and set quit date in about 1 week.

## 2021-10-16 NOTE — Assessment & Plan Note (Signed)
Present and stable.

## 2021-10-16 NOTE — Assessment & Plan Note (Signed)
Has a history of severe emphysema with asthma.  Reports he is using his inhalers does get short of breath occasionally but no issues today.  He is interested in quitting smoking and I have prescribed chantix

## 2021-10-16 NOTE — Assessment & Plan Note (Signed)
He has a PPM in place, denies any issues, HR normal today.

## 2021-10-16 NOTE — Assessment & Plan Note (Addendum)
He has had a right renal cyst dating back to 2014.  It was reimaged on ultrasound in late 2022 and was not changed in size although radiology did noted a thickened septation and recommended to consider a MRI.  Given his ongoing weight loss I do not want to leave any stones unturned and will order this MRI.  He does have a pacemaker I am not exactly sure about its compatibility but have noted this in the order if it is incompatible then we will plan to discuss with radiology about best alternative testing.

## 2021-10-16 NOTE — Assessment & Plan Note (Signed)
His chronic low back pain I suspect is due to degenerative disc disease he has previous imaging from 2011 showing some degeneration of the L5-S1 area which is consistent with where his pain is.  No radicular symptoms or weakness.  Discussed with him starting with a physical therapy referral which he is agreeable with.

## 2021-10-16 NOTE — Assessment & Plan Note (Addendum)
Discussed the importance of taking his cholesterol medication every day he will restart on this and we can check his lipid panel at the next visit

## 2021-10-17 LAB — URINALYSIS, ROUTINE W REFLEX MICROSCOPIC
Bilirubin, UA: NEGATIVE
Glucose, UA: NEGATIVE
Nitrite, UA: NEGATIVE
RBC, UA: NEGATIVE
Specific Gravity, UA: 1.016 (ref 1.005–1.030)
Urobilinogen, Ur: 1 mg/dL (ref 0.2–1.0)
pH, UA: 6 (ref 5.0–7.5)

## 2021-10-17 LAB — CMP14 + ANION GAP
ALT: 15 IU/L (ref 0–44)
AST: 26 IU/L (ref 0–40)
Albumin/Globulin Ratio: 1.9 (ref 1.2–2.2)
Albumin: 4.4 g/dL (ref 3.8–4.8)
Alkaline Phosphatase: 135 IU/L — ABNORMAL HIGH (ref 44–121)
Anion Gap: 12 mmol/L (ref 10.0–18.0)
BUN/Creatinine Ratio: 11 (ref 10–24)
BUN: 10 mg/dL (ref 8–27)
Bilirubin Total: 0.5 mg/dL (ref 0.0–1.2)
CO2: 26 mmol/L (ref 20–29)
Calcium: 9.3 mg/dL (ref 8.6–10.2)
Chloride: 102 mmol/L (ref 96–106)
Creatinine, Ser: 0.92 mg/dL (ref 0.76–1.27)
Globulin, Total: 2.3 g/dL (ref 1.5–4.5)
Glucose: 91 mg/dL (ref 70–99)
Potassium: 4.2 mmol/L (ref 3.5–5.2)
Sodium: 140 mmol/L (ref 134–144)
Total Protein: 6.7 g/dL (ref 6.0–8.5)
eGFR: 88 mL/min/{1.73_m2} (ref >59)

## 2021-10-17 LAB — CBC
Hematocrit: 38.7 % (ref 37.5–51.0)
Hemoglobin: 13.1 g/dL (ref 13.0–17.7)
MCH: 29.6 pg (ref 26.6–33.0)
MCHC: 33.9 g/dL (ref 31.5–35.7)
MCV: 88 fL (ref 79–97)
Platelets: 225 10*3/uL (ref 150–450)
RBC: 4.42 x10E6/uL (ref 4.14–5.80)
RDW: 14.5 % (ref 11.6–15.4)
WBC: 4.4 10*3/uL (ref 3.4–10.8)

## 2021-10-17 LAB — MICROSCOPIC EXAMINATION
Bacteria, UA: NONE SEEN
Casts: NONE SEEN /lpf
RBC, Urine: NONE SEEN /hpf (ref 0–2)

## 2021-10-17 LAB — SEDIMENTATION RATE: Sed Rate: 2 mm/hr (ref 0–30)

## 2021-10-17 LAB — C-REACTIVE PROTEIN: CRP: 4 mg/L (ref 0–10)

## 2021-10-22 ENCOUNTER — Telehealth: Payer: Self-pay

## 2021-10-22 NOTE — Telephone Encounter (Signed)
Pa for pt ( CHANTIX ) came through on cover my meds was submitted with last office notes .Marland Kitchen Awaiting approval or denial ..       UPDATE :   [employer] is reviewing your PA request. Typically an electronic response will be received within 24-72 hours

## 2021-10-24 NOTE — Telephone Encounter (Signed)
UPDATE .Marland KitchenMarland Kitchen      It has been a few days so I have renewed the PA  and am awaiting the decision

## 2021-10-24 NOTE — Telephone Encounter (Signed)
DECISION :    Out come  Approved today  Request Reference Number: [id number].   CHANTIX TAB 0.'5MG'$  is approved through 03/08/2022.   Your patient may now fill this prescription and it will be covered.   Drug Chantix 0.'5MG'$  tablets   Form [employer] Medicare Part D Electronic Prior Authorization Form (2017 NCPDP)       ( Copy sent to pharmacy also )

## 2021-10-28 ENCOUNTER — Other Ambulatory Visit (HOSPITAL_COMMUNITY): Payer: Self-pay

## 2021-10-30 ENCOUNTER — Telehealth: Payer: Self-pay | Admitting: *Deleted

## 2021-10-30 NOTE — Telephone Encounter (Signed)
Patient was around a lot of people who had Covid .  Wants to know what he should do now.   Had a recent at Pharmacy.  Waiting for results.  Wife is positive.   Advised to isolate from wife if he is negative for at least 5 days.   Good handwashing and wear a mask.

## 2021-10-31 NOTE — Telephone Encounter (Signed)
Good advice, certainly if he has a positive test or develops symptoms we should schedule a virtual visit with one of the residents.

## 2021-11-02 ENCOUNTER — Encounter (HOSPITAL_BASED_OUTPATIENT_CLINIC_OR_DEPARTMENT_OTHER): Payer: Self-pay | Admitting: Emergency Medicine

## 2021-11-02 ENCOUNTER — Other Ambulatory Visit: Payer: Self-pay

## 2021-11-02 ENCOUNTER — Emergency Department (HOSPITAL_BASED_OUTPATIENT_CLINIC_OR_DEPARTMENT_OTHER)
Admission: EM | Admit: 2021-11-02 | Discharge: 2021-11-02 | Disposition: A | Discharge status: Home / Self Care | Payer: Medicare Other | Attending: Emergency Medicine | Admitting: Emergency Medicine

## 2021-11-02 ENCOUNTER — Emergency Department (HOSPITAL_BASED_OUTPATIENT_CLINIC_OR_DEPARTMENT_OTHER)
Admission: EM | Admit: 2021-11-02 | Discharge: 2021-11-02 | Discharge status: Against Medical Advice | Payer: Medicare Other

## 2021-11-02 DIAGNOSIS — R059 Cough, unspecified: Secondary | ICD-10-CM | POA: Diagnosis present

## 2021-11-02 DIAGNOSIS — U071 COVID-19: Secondary | ICD-10-CM | POA: Insufficient documentation

## 2021-11-02 MED ORDER — MOLNUPIRAVIR EUA 200MG CAPSULE: 4.0000 | ORAL_CAPSULE | Freq: Two times a day (BID) | ORAL | 0 refills | Status: AC

## 2021-11-02 NOTE — ED Notes (Signed)
Called pt, no answer.

## 2021-11-02 NOTE — ED Provider Notes (Signed)
[facility] EMERGENCY DEPT Provider Note   CSN: [id number] Arrival date & time: 11/02/21  1745     History  Chief Complaint  Patient presents with   Covid Positive    [name] is a 71 y.o. male who presents to the emergency department with concerns for at home positive COVID test.  Was at a family reunion yesterday.  Began to have rhinorrhea, nasal congestion, cough.  Here in the emergency department today with request of antivirals.  No medications tried at home for symptoms.  Denies chest pain, shortness of breath, nausea, vomiting.  The history is provided by the patient. No language interpreter was used.       Home Medications Prior to Admission medications   Medication Sig Start Date End Date Taking? Authorizing Provider  molnupiravir EUA (LAGEVRIO) 200 mg CAPS capsule Take 4 capsules (800 mg total) by mouth 2 (two) times daily for 5 days. 11/02/21 11/07/21 Yes Khayree Delellis A, PA-C  albuterol (VENTOLIN HFA) 108 (90 Base) MCG/ACT inhaler Inhale 2 puffs into the lungs every 6 (six) hours as needed for wheezing or shortness of breath. 06/26/20   Lacinda Axon, MD  amLODipine-atorvastatin (CADUET) 10-80 MG tablet Take 1 tablet by mouth daily. 12/30/20 12/30/21  Lacinda Axon, MD  cetirizine (ZYRTEC ALLERGY) 10 MG tablet Take 1 tablet (10 mg total) by mouth daily. 01/28/21 01/28/22  Jose Persia, MD  chlorthalidone (HYGROTON) 25 MG tablet Take 1 tablet (25 mg total) by mouth daily. 12/30/20   Lacinda Axon, MD  guaiFENesin (ROBITUSSIN) 100 MG/5ML liquid Take 5-10 mLs (100-200 mg total) by mouth every 4 (four) hours as needed for cough. 08/01/20   Sherwood Gambler, MD  sildenafil (VIAGRA) 100 MG tablet Take 0.5 tablets (50 mg total) by mouth as needed for erectile dysfunction. 10/16/21 10/16/22  [name], DO  varenicline (CHANTIX CONTINUING MONTH PAK) 1 MG tablet Take 1 tablet (1 mg total) by mouth 2 (two) times daily. 11/13/21   [name], DO  varenicline (CHANTIX) 0.5 MG tablet Take 1 tablet (0.5 mg total) by mouth daily for 3 days, THEN 1 tablet (0.5 mg total) 2 (two) times daily for 4 days, THEN 2 tablets (1 mg total) 2 (two) times daily for 21 days. 10/16/21 11/13/21  [name], DO      Allergies    Ace inhibitors    Review of Systems   Review of Systems  Constitutional:  Negative for fever.  HENT:  Positive for congestion and rhinorrhea.   Respiratory:  Positive for cough. Negative for shortness of breath.   Cardiovascular:  Negative for chest pain.  Gastrointestinal:  Negative for nausea and vomiting.    Physical Exam Updated Vital Signs BP 124/85 (BP Location: Right Arm)   Pulse 84   Temp 99.4 F (37.4 C) (Oral)   Resp 18   SpO2 100%  Physical Exam Vitals and nursing note reviewed.  Constitutional:      General: He is not in acute distress.    Appearance: He is not diaphoretic.  HENT:     Head: Normocephalic and atraumatic.     Mouth/Throat:     Pharynx: No oropharyngeal exudate.  Eyes:     General: No scleral icterus.    Conjunctiva/sclera: Conjunctivae normal.  Cardiovascular:     Rate and Rhythm: Normal rate and regular rhythm.     Pulses: Normal pulses.     Heart sounds: Normal heart sounds.  Pulmonary:  Effort: Pulmonary effort is normal. No respiratory distress.     Breath sounds: Normal breath sounds. No wheezing.  Abdominal:     General: Bowel sounds are normal.     Palpations: Abdomen is soft. There is no mass.     Tenderness: There is no abdominal tenderness. There is no guarding or rebound.  Musculoskeletal:        General: Normal range of motion.     Cervical back: Normal range of motion and neck supple.  Skin:    General: Skin is warm and dry.  Neurological:     Mental Status: He is alert.  Psychiatric:        Behavior: Behavior normal.     ED Results / Procedures / Treatments   Labs (all labs ordered are listed, but only abnormal results are displayed) Labs  Reviewed - No data to display  EKG None  Radiology No results found.  Procedures Procedures    Medications Ordered in ED Medications - No data to display  ED Course/ Medical Decision Making/ A&P                           Medical Decision Making  Pt presents with rhinorrhea, nasal congestion, body aches onset yesterday.  Had a positive at-home COVID test today.  Patient afebrile.  On exam patient without acute cardiovascular, respiratory, down exam findings.  Differential diagnosis includes COVID, flu, PNA, viral URI with cough.    Labs:  I ordered, and personally interpreted labs.  The pertinent results include:   COVID swab positive  Disposition: Pt presentation suspicious for COVID-19. Doubt flu, PNA, or viral URI with cough at this time. After consideration of the diagnostic results and the patients response to treatment, I feel that the patient would benefit from Discharge home. Pt will be discharged home with Molnupiravir. Thorough discussion regarding quarantine/isolation period as per CDC guidelines. Supportive care measures and strict return precautions discussed with patient at bedside. Pt acknowledges and verbalizes understanding. Pt appears safe for discharge. Follow up as indicated in discharge paperwork.    This chart was dictated using voice recognition software, Dragon. Despite the best efforts of this provider to proofread and correct errors, errors may still occur which can change documentation meaning.   Final Clinical Impression(s) / ED Diagnoses Final diagnoses:  [id number]    Rx / DC Orders ED Discharge Orders          Ordered    molnupiravir EUA (LAGEVRIO) 200 mg CAPS capsule  2 times daily        11/02/21 2117              Florence Yeung A, PA-C 11/02/21 2149    Wyvonnia Dusky, MD 11/03/21 1057

## 2021-11-02 NOTE — ED Notes (Signed)
Pt took a test yesterday without symptoms due to the crowd he was with the last few days. He noted it was positive but no symptoms. Today he noted symptoms. Chills, temp, cough and diarrhea.

## 2021-11-02 NOTE — ED Triage Notes (Signed)
Pt tested positive for covid today, symptoms started yesterday. Pt interested in antiviral

## 2021-11-02 NOTE — ED Notes (Signed)
Called x1, no answer

## 2021-11-02 NOTE — Discharge Instructions (Addendum)
It was a pleasure taking care of you!    According to the CDC, you must self quarantine for 5 days from the start of your symptoms.  Your quarantine period ends on 11/06/21.  You will be sent a prescription for molnupiravir. Ensure to maintain fluid intake with tea, soup, broth, Pedialyte, Gatorade, water. You may follow-up with your primary care provider as needed.  Return to the Emergency Department if you are experiencing trouble breathing, chest pain, decreased fluid intake or worsening symptoms.

## 2021-11-20 ENCOUNTER — Telehealth: Payer: Self-pay | Admitting: *Deleted

## 2021-11-20 NOTE — Telephone Encounter (Signed)
Patient called in requesting appt for nausea x 1 week. States he does not eat much in the summer; this is his usual. No change in diet. Denies any other sx. Has not tried anything for this. Encouraged to try OTC Pepto Bismol and keep appt with PCP on 9/21. States he will.

## 2021-11-27 ENCOUNTER — Encounter: Payer: Medicaid Other | Admitting: Internal Medicine

## 2021-12-12 ENCOUNTER — Encounter: Payer: Self-pay | Admitting: Internal Medicine

## 2021-12-12 ENCOUNTER — Ambulatory Visit (INDEPENDENT_AMBULATORY_CARE_PROVIDER_SITE_OTHER): Payer: Medicaid Other | Admitting: Internal Medicine

## 2021-12-12 DIAGNOSIS — R109 Unspecified abdominal pain: Secondary | ICD-10-CM

## 2021-12-12 NOTE — Progress Notes (Signed)
Patient scheduled for telehealth appointment for abdominal pain. I attempted to contact the patient on multiple occasions with no response. I called the preferred number listed in the visit information [phone]) X2 times and left VM for patient to return call to clinic and that I would continue to attempt to contact him. I also called patient's home phone number listed in chart [phone]) with no response. Lastly, I called the patient's wife [name]. I was able to reach spouse, however, she stated she is currently in [place]. I asked that she attempt to contact him and let him know I have been trying to reach him. She confirmed she would. I re-attempted the patient's preferred number once more, but was unable to reach him.

## 2021-12-15 NOTE — Progress Notes (Signed)
Multiple attempts were made to contact the patient but were unsuccessful.

## 2021-12-22 ENCOUNTER — Telehealth: Payer: Self-pay

## 2021-12-22 NOTE — Telephone Encounter (Signed)
Received a patient initiated transmission flag from [employer]:  Pt. initiated transmission 3 AHR's EGM's suggest bursts of SVT 5 NSVT, likely 6-22beats of NSVT Next remote 10/24 Route to triage [place]  Patient has not been seen in 1 year.  We, also, have not received any transmissions since last October except for the one today.  This transmission contains episodes and data dating back to 2019.   Reviewed transmission, most recent episode is of  1 NSVT  (22 beats) noted in July of this year;however, nothing to correlate with any current symptoms if patient has concerns.  There were no episodes from July 23 to present.   The only other episode to note from this year was an atrial episode lasting 3 mins of AT.   I attempted to reach patient and wife by all numbers listed in chart and under emergency contacts.    Will mail patient a letter stating that we are trying to reach out and that he needs an appointment here in near future for continued follow up and care.

## 2021-12-22 NOTE — Telephone Encounter (Signed)
Unable to reach patient by phone x 3.  Unable to leave message.

## 2021-12-22 NOTE — Telephone Encounter (Signed)
Requesting to speak with a nurse about having numbness on the left hand for one month. Please call pt back. Pt has an appt with Dr. Heber Bassett 12/25/2021. Requesting to be seen sooner. No open slot for this week. Please call pt back.

## 2021-12-23 NOTE — Telephone Encounter (Signed)
Letter sent out in mail to have patient contact us.   We do not have any viable phone numbers for him or his emergency contact.

## 2021-12-23 NOTE — Telephone Encounter (Signed)
Erroneous encounter

## 2021-12-25 ENCOUNTER — Ambulatory Visit (INDEPENDENT_AMBULATORY_CARE_PROVIDER_SITE_OTHER): Payer: Medicare Other | Admitting: Internal Medicine

## 2021-12-25 ENCOUNTER — Encounter: Payer: Self-pay | Admitting: Internal Medicine

## 2021-12-25 ENCOUNTER — Other Ambulatory Visit: Payer: Self-pay

## 2021-12-25 VITALS — BP 143/99 | HR 66 | Temp 97.6°F | Ht 73.0 in | Wt 144.5 lb

## 2021-12-25 DIAGNOSIS — I10 Essential (primary) hypertension: Secondary | ICD-10-CM | POA: Diagnosis not present

## 2021-12-25 DIAGNOSIS — G459 Transient cerebral ischemic attack, unspecified: Secondary | ICD-10-CM | POA: Diagnosis not present

## 2021-12-25 DIAGNOSIS — F17210 Nicotine dependence, cigarettes, uncomplicated: Secondary | ICD-10-CM | POA: Diagnosis not present

## 2021-12-25 DIAGNOSIS — Z950 Presence of cardiac pacemaker: Secondary | ICD-10-CM

## 2021-12-25 DIAGNOSIS — Z23 Encounter for immunization: Secondary | ICD-10-CM

## 2021-12-25 DIAGNOSIS — Z720 Tobacco use: Secondary | ICD-10-CM

## 2021-12-25 HISTORY — DX: Transient cerebral ischemic attack, unspecified: G45.9

## 2021-12-25 MED ORDER — CHLORTHALIDONE 25 MG PO TABS: 25.0000 mg | ORAL_TABLET | Freq: Every day | ORAL | 3 refills | Status: DC

## 2021-12-25 MED ORDER — CLOPIDOGREL BISULFATE 75 MG PO TABS: 75.0000 mg | ORAL_TABLET | Freq: Every day | ORAL | 0 refills | Status: DC

## 2021-12-25 MED ORDER — ASPIRIN 81 MG PO TBEC: 81.0000 mg | DELAYED_RELEASE_TABLET | Freq: Every day | ORAL | 2 refills | Status: DC

## 2021-12-25 NOTE — Assessment & Plan Note (Signed)
I am concerned his symptoms of transient left leg weakness was due to a TIA.  His ABCD 2 score is 5 located moderate risk for stroke.  I encouraged him to be directly admitted for expedited work-up however he says he has a church event this week and cannot miss it and he would be willing to do everything else that I ask. In that case I will start him back on aspirin 81 mg daily told him this will be indefinite we will add Plavix for the next 3 weeks.  Recheck lipid panel and reinforced adherence to his amlodipine atorvastatin and reminded him he needs to also take chlorthalidone.  I discussed again the importance of smoking cessation.  We will obtain MRI of the head and carotid Dopplers.  I will we will have him follow-up with Dr. Rayann Heman for pacemaker interrogation.

## 2021-12-25 NOTE — Assessment & Plan Note (Signed)
Has not been taking chlorthalidone but is taking amlodipine atorvastatin recent chlorthalidone discussed that he will need to take this medication as well.

## 2021-12-25 NOTE — Telephone Encounter (Signed)
Seen today in clinic.

## 2021-12-25 NOTE — Patient Instructions (Signed)
I want you to take a baby aspirin ('81mg'$ ) every day for the rest of your life unless I or another doctor tell you to stop For the next 3 weeks I also want you to take Clopidogrel '75mg'$ .  I want you to make sure you pick up and start taking Chlorthalidone '25mg'$  for your blood pressure.

## 2021-12-25 NOTE — Assessment & Plan Note (Signed)
Picked up Chantix but did not take.  Reinforced that TIA and stroke could be life-threatening but certainly very disabling and this is the best time to go ahead and quit for good.

## 2021-12-25 NOTE — Progress Notes (Signed)
Established Patient Office Visit  Subjective   Patient ID: [name], male    DOB: 07-Dec-1950  Age: 71 y.o. MRN: [id number]  Chief Complaint  Patient presents with   Follow-up    Check BP. Weight loss. Episode of left leg numbness. Balance.   Flu Vaccine    Last week was at grocery store and left leg went numb and felt weak. Had to use motorized wheelchair at the store. By the time of checkout everything was back to normal. Notes he felt like he was dragging his foot. Yesterday felt like his left arm was weak and fingers felt swollen however they were not actually swollen.  Only lasted a few minutes.   He has not been taking a daily aspirin, he says he has the bottle at home but usually forgets. Has not been taking chlorhtalidone. Taking amlodipine-atorvastatin.  Thinks something may be wrong with his pacemaker, not sure what, was going to go by dr allreds office and check. Notes that he has had phone issues lately. (They tried to reach out a few days ago after an episode of SVT and NSVT)      Objective:     BP (!) 143/99 (BP Location: Right Arm, Patient Position: Sitting, Cuff Size: Normal)   Pulse 66   Temp 97.6 F (36.4 C) (Oral)   Ht 6' 1"  (1.854 m)   Wt 144 lb 8 oz (65.5 kg)   SpO2 100% Comment: RA  BMI 19.06 kg/m  BP Readings from Last 3 Encounters:  12/25/21 (!) 143/99  11/02/21 124/85  10/16/21 (!) 160/106   Wt Readings from Last 3 Encounters:  12/25/21 144 lb 8 oz (65.5 kg)  10/16/21 146 lb 1.6 oz (66.3 kg)  01/28/21 154 lb 4.8 oz (70 kg)      Physical Exam Vitals and nursing note reviewed.  Constitutional:      Appearance: Normal appearance.  HENT:     Mouth/Throat:     Mouth: Mucous membranes are moist.     Pharynx: Oropharynx is clear.  Eyes:     Extraocular Movements: Extraocular movements intact.  Cardiovascular:     Rate and Rhythm: Normal rate and regular rhythm.  Pulmonary:     Effort: Pulmonary effort is normal.     Breath  sounds: Normal breath sounds.  Abdominal:     General: Abdomen is flat.     Palpations: Abdomen is soft.  Musculoskeletal:     Right lower leg: No edema.     Left lower leg: No edema.  Skin:    General: Skin is warm.  Neurological:     General: No focal deficit present.     Mental Status: He is alert.     Cranial Nerves: No cranial nerve deficit.     Comments: 5/5 RUE 5/5 LUE 5/5LLE 5/5 RLE  Psychiatric:        Mood and Affect: Mood normal.        Thought Content: Thought content normal.      No results found for any visits on 12/25/21.  Last CBC Lab Results  Component Value Date   WBC 4.4 10/16/2021   HGB 13.1 10/16/2021   HCT 38.7 10/16/2021   MCV 88 10/16/2021   MCH 29.6 10/16/2021   RDW 14.5 10/16/2021   PLT 225 58/11/9831   Last metabolic panel Lab Results  Component Value Date   GLUCOSE 91 10/16/2021   NA 140 10/16/2021   K 4.2 10/16/2021   CL 102  10/16/2021   CO2 26 10/16/2021   BUN 10 10/16/2021   CREATININE 0.92 10/16/2021   EGFR 88 10/16/2021   CALCIUM 9.3 10/16/2021   PROT 6.7 10/16/2021   ALBUMIN 4.4 10/16/2021   LABGLOB 2.3 10/16/2021   AGRATIO 1.9 10/16/2021   BILITOT 0.5 10/16/2021   ALKPHOS 135 (H) 10/16/2021   AST 26 10/16/2021   ALT 15 10/16/2021   ANIONGAP 7 11/06/2020   Last lipids Lab Results  Component Value Date   CHOL 187 08/10/2018   HDL 53 08/10/2018   LDLCALC 123 (H) 08/10/2018   TRIG 57 08/10/2018   CHOLHDL 3.5 08/10/2018   Last hemoglobin A1c Lab Results  Component Value Date   HGBA1C 5.5 08/10/2018      The ASCVD Risk score (Arnett DK, et al., 2019) failed to calculate for the following reasons:   Cannot find a previous HDL lab   Cannot find a previous total cholesterol lab    Assessment & Plan:   Problem List Items Addressed This Visit       Cardiovascular and Mediastinum   Essential hypertension (Chronic)    Has not been taking chlorthalidone but is taking amlodipine atorvastatin recent  chlorthalidone discussed that he will need to take this medication as well.      Relevant Medications   aspirin EC 81 MG tablet   chlorthalidone (HYGROTON) 25 MG tablet   TIA (transient ischemic attack) - Primary    I am concerned his symptoms of transient left leg weakness was due to a TIA.  His ABCD 2 score is 5 located moderate risk for stroke.  I encouraged him to be directly admitted for expedited work-up however he says he has a church event this week and cannot miss it and he would be willing to do everything else that I ask. In that case I will start him back on aspirin 81 mg daily told him this will be indefinite we will add Plavix for the next 3 weeks.  Recheck lipid panel and reinforced adherence to his amlodipine atorvastatin and reminded him he needs to also take chlorthalidone.  I discussed again the importance of smoking cessation.  We will obtain MRI of the head and carotid Dopplers.  I will we will have him follow-up with Dr. Rayann Heman for pacemaker interrogation.      Relevant Medications   aspirin EC 81 MG tablet   chlorthalidone (HYGROTON) 25 MG tablet   Other Relevant Orders   Lipid Profile   VAS US CAROTID   MR Brain Wo Contrast   CMP14 + Anion Gap   CBC no Diff   Hemoglobin A1c     Other   Tobacco abuse (Chronic)    Picked up Chantix but did not take.  Reinforced that TIA and stroke could be life-threatening but certainly very disabling and this is the best time to go ahead and quit for good.      Pacemaker (Chronic)   Relevant Orders   Ambulatory referral to Cardiac Electrophysiology    Return in about 3 weeks (around 01/15/2022).    [name], DO

## 2021-12-26 LAB — CMP14 + ANION GAP
ALT: 36 IU/L (ref 0–44)
AST: 33 IU/L (ref 0–40)
Albumin/Globulin Ratio: 1.8 (ref 1.2–2.2)
Albumin: 4.4 g/dL (ref 3.8–4.8)
Alkaline Phosphatase: 125 IU/L — ABNORMAL HIGH (ref 44–121)
Anion Gap: 14 mmol/L (ref 10.0–18.0)
BUN/Creatinine Ratio: 11 (ref 10–24)
BUN: 10 mg/dL (ref 8–27)
Bilirubin Total: 0.5 mg/dL (ref 0.0–1.2)
CO2: 25 mmol/L (ref 20–29)
Calcium: 9.5 mg/dL (ref 8.6–10.2)
Chloride: 103 mmol/L (ref 96–106)
Creatinine, Ser: 0.92 mg/dL (ref 0.76–1.27)
Globulin, Total: 2.4 g/dL (ref 1.5–4.5)
Glucose: 87 mg/dL (ref 70–99)
Potassium: 4.6 mmol/L (ref 3.5–5.2)
Sodium: 142 mmol/L (ref 134–144)
Total Protein: 6.8 g/dL (ref 6.0–8.5)
eGFR: 88 mL/min/{1.73_m2} (ref >59)

## 2021-12-26 LAB — CBC
Hematocrit: 38.8 % (ref 37.5–51.0)
Hemoglobin: 12.6 g/dL — ABNORMAL LOW (ref 13.0–17.7)
MCH: 29.4 pg (ref 26.6–33.0)
MCHC: 32.5 g/dL (ref 31.5–35.7)
MCV: 91 fL (ref 79–97)
Platelets: 227 10*3/uL (ref 150–450)
RBC: 4.28 x10E6/uL (ref 4.14–5.80)
RDW: 16.3 % — ABNORMAL HIGH (ref 11.6–15.4)
WBC: 4.6 10*3/uL (ref 3.4–10.8)

## 2021-12-26 LAB — LIPID PANEL
Chol/HDL Ratio: 3.7 ratio (ref 0.0–5.0)
Cholesterol, Total: 219 mg/dL — ABNORMAL HIGH (ref 100–199)
HDL: 60 mg/dL (ref >39)
LDL Chol Calc (NIH): 143 mg/dL — ABNORMAL HIGH (ref 0–99)
Triglycerides: 91 mg/dL (ref 0–149)
VLDL Cholesterol Cal: 16 mg/dL (ref 5–40)

## 2021-12-26 LAB — HEMOGLOBIN A1C
Est. average glucose Bld gHb Est-mCnc: 114 mg/dL
Hgb A1c MFr Bld: 5.6 % (ref 4.8–5.6)

## 2021-12-30 ENCOUNTER — Ambulatory Visit: Payer: Medicare HMO

## 2022-01-05 ENCOUNTER — Ambulatory Visit: Payer: Medicare Other | Attending: Cardiovascular Disease | Admitting: Cardiovascular Disease

## 2022-01-05 ENCOUNTER — Encounter: Payer: Self-pay | Admitting: Cardiovascular Disease

## 2022-01-05 VITALS — BP 98/72 | HR 92 | Ht 73.0 in | Wt 128.0 lb

## 2022-01-05 DIAGNOSIS — I495 Sick sinus syndrome: Secondary | ICD-10-CM

## 2022-01-05 NOTE — Progress Notes (Signed)
Cardiology Office Note:    Date:  01/05/2022   ID:  [name], DOB May 10, 1950, MRN [id number]  PCP:  [name], [place] [facility] Cardiologist:  None Electrophysiologist:  Craig Quitter, MD     Referring MD: Craig Groves, DO   Chief complaint: weight loss. Here for pacemaker check  History of Present Illness:    [name] is a 71 y.o. male with a hx of symptomatic bradycardia with a medtronic dual chamber pacemaker placed by Dr. Elisabeth Reyes in 2010.  he has no device related complaints -- no new tenderness, drainage, redness. His primary complaint today is unexplained weight loss.     Past Medical History:  Diagnosis Date   Atrial tachycardia 04/11/2012   CEREBROVASCULAR ACCIDENT 10/17/2008   Acute CVA of right thalamus 10/03/08 ECHO performed 2/2 CVA 10/09/08, EF 55-65%  Aspirin 325 mg daily and try to work on quitting smoking.    Emphysema    per multiple CXRs   Hypertension    goal < 140/90   Sickle cell trait (HCC)    Snoring    Sleep study 09/02/10 was WNL   Stroke [facility]) 10/03/08   Right thalamus   Tuberculosis at age 64-3    Past Surgical History:  Procedure Laterality Date   [place]  07/24/08   MDT Adapta L implanted by Dr [name]   VASECTOMY  1990    Current Medications: Current Meds  Medication Sig   albuterol (VENTOLIN HFA) 108 (90 Base) MCG/ACT inhaler Inhale 2 puffs into the lungs every 6 (six) hours as needed for wheezing or shortness of breath.   aspirin EC 81 MG tablet Take 1 tablet (81 mg total) by mouth daily. Swallow whole.   cetirizine (ZYRTEC ALLERGY) 10 MG tablet Take 1 tablet (10 mg total) by mouth daily.   chlorthalidone (HYGROTON) 25 MG tablet Take 1 tablet (25 mg total) by mouth daily.   clopidogrel (PLAVIX) 75 MG tablet Take 1 tablet (75 mg total) by mouth daily.   sildenafil (VIAGRA) 100 MG tablet Take 0.5 tablets (50 mg total) by mouth as needed for erectile  dysfunction.     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 DOWN TO 3-4 CIAGARETTES  Vaping Use   Vaping Use: Never used  Substance and Sexual Activity   Alcohol use: No    Alcohol/week: 0.0 standard drinks of alcohol   Drug use: No    Comment: previous polysubstance abuser, quit x 13 years   Sexual activity: Not on file  Other Topics Concern   Not on file  Social History Narrative   Current Social History 06/29/2019        Patient lives with spouse in a ground floor apartment which is 1 story. There are not steps up to the entrance the patient uses.       Patient's method  of transportation is personal car.      The highest level of education was Bachelor's Degree; now working on [employer].      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      Identified important Relationships are "My wife"      Pets : None       Interests / Fun: Computers, "I'm an Optician, dispensing."       Current Stressors: "I hardly let anything bother me. I walk away"       Religious / Personal Beliefs: [employer] Holiness       [name], BSN, RN-BC       Social Determinants of Health   Financial Resource Strain: Not on file  Food Insecurity: Not on file  Transportation Needs: Not on file  Physical Activity: Not on file  Stress: Not on file  Social Connections: Not on file     Family History: The patient's family history includes Cancer in his sister; Hypertension in his  mother; Sickle cell anemia in his son; Sickle cell trait in his son.  ROS:   Please see the history of present illness.    All other systems reviewed and are negative.  EKGs/Labs/Other Studies Reviewed:     EKG:  Last EKG results: today - sinus rhythm with inferolateral ST&T inversion   Recent Labs: 01/28/2021: TSH 3.080 12/25/2021: ALT 36; BUN 10; Creatinine, Ser 0.92; Hemoglobin 12.6; Platelets 227; Potassium 4.6; Sodium 142    Physical Exam:    VS:  BP 98/72   Pulse 92   Ht '6\' 1"'$  (1.854 m)   Wt 128 lb (58.1 kg)   SpO2 98%   BMI 16.89 kg/m     Wt Readings from Last 3 Encounters:  01/05/22 128 lb (58.1 kg)  12/25/21 144 lb 8 oz (65.5 kg)  10/16/21 146 lb 1.6 oz (66.3 kg)     GEN:  Appears thin, in no acute distress CARDIAC: RRR, no murmurs, rubs, gallops RESPIRATORY:  Normal work of breathing MUSCULOSKELETAL: no edema    ASSESSMENT & PLAN:    Medtronic dual chamber pacemaker: normal device function. Low pacing burden. Battery > 3 years.         Medication Adjustments/Labs and Tests Ordered: Current medicines are reviewed at length with the patient today.  Concerns regarding medicines are outlined above.  No orders of the defined types were placed in this encounter.  No orders of the defined types were placed in this encounter.    Signed, Craig Quitter, MD  01/05/2022 3:54 PM    [name]

## 2022-01-05 NOTE — Patient Instructions (Signed)
Medication Instructions:  Your physician recommends that you continue on your current medications as directed. Please refer to the Current Medication list given to you today.  *If you need a refill on your cardiac medications before your next appointment, please call your pharmacy*  Follow-Up: At [facility], you and your health needs are our priority.  As part of our continuing mission to provide you with exceptional heart care, we have created designated Provider Care Teams.  These Care Teams include your primary Cardiologist (physician) and Advanced Practice Providers (APPs -  Physician Assistants and Nurse Practitioners) who all work together to provide you with the care you need, when you need it.  Your next appointment:   1 year(s)  The format for your next appointment:   In Person  Provider:   You will see one of the following Advanced Practice Providers on your designated Care Team:   Tommye Standard, [place] Legrand Como "Jonni Sanger" Chalmers Cater, [place] Important Information About Sugar

## 2022-01-15 ENCOUNTER — Encounter: Payer: Self-pay | Admitting: Student

## 2022-01-15 ENCOUNTER — Telehealth: Payer: Self-pay | Admitting: *Deleted

## 2022-01-15 ENCOUNTER — Ambulatory Visit (INDEPENDENT_AMBULATORY_CARE_PROVIDER_SITE_OTHER): Payer: Medicare Other | Admitting: Student

## 2022-01-15 ENCOUNTER — Ambulatory Visit (INDEPENDENT_AMBULATORY_CARE_PROVIDER_SITE_OTHER): Payer: Medicare Other | Admitting: *Deleted

## 2022-01-15 VITALS — BP 110/80 | HR 82 | Temp 98.2°F | Ht 73.0 in | Wt 134.6 lb

## 2022-01-15 VITALS — BP 110/80 | HR 81 | Temp 98.2°F | Ht 73.0 in | Wt 134.6 lb

## 2022-01-15 DIAGNOSIS — Z0000 Encounter for general adult medical examination without abnormal findings: Secondary | ICD-10-CM

## 2022-01-15 DIAGNOSIS — G459 Transient cerebral ischemic attack, unspecified: Secondary | ICD-10-CM | POA: Diagnosis not present

## 2022-01-15 DIAGNOSIS — F17210 Nicotine dependence, cigarettes, uncomplicated: Secondary | ICD-10-CM

## 2022-01-15 DIAGNOSIS — Z1211 Encounter for screening for malignant neoplasm of colon: Secondary | ICD-10-CM | POA: Diagnosis not present

## 2022-01-15 DIAGNOSIS — Z720 Tobacco use: Secondary | ICD-10-CM

## 2022-01-15 DIAGNOSIS — Z5941 Food insecurity: Secondary | ICD-10-CM | POA: Diagnosis not present

## 2022-01-15 DIAGNOSIS — I10 Essential (primary) hypertension: Secondary | ICD-10-CM

## 2022-01-15 DIAGNOSIS — R634 Abnormal weight loss: Secondary | ICD-10-CM | POA: Diagnosis not present

## 2022-01-15 DIAGNOSIS — Z59819 Housing instability, housed unspecified: Secondary | ICD-10-CM

## 2022-01-15 DIAGNOSIS — Z599 Problem related to housing and economic circumstances, unspecified: Secondary | ICD-10-CM

## 2022-01-15 DIAGNOSIS — Z5986 Financial insecurity: Secondary | ICD-10-CM

## 2022-01-15 MED ORDER — CETIRIZINE HCL 10 MG PO TABS: 10.0000 mg | ORAL_TABLET | Freq: Every day | ORAL | 2 refills | Status: DC

## 2022-01-15 NOTE — Patient Instructions (Addendum)
[name],  It was a pleasure seeing you in the clinic today.   I am happy to hear that you are feeling better and your weight is starting to trend up! I have ordered a colonoscopy for you for screening purposes. They will call you to schedule this. I have sent zyrtec to your pharmacy to help with your allergy symptoms.  Please call our clinic at [phone] if you have any questions or concerns. The best time to call is Monday-Friday from 9am-4pm, but there is someone available 24/7 at the same number. If you need medication refills, please notify your pharmacy one week in advance and they will send [place] a request.   Thank you for letting us take part in your care. We look forward to seeing you next time!

## 2022-01-15 NOTE — Assessment & Plan Note (Addendum)
He endorses weight loss after starting chlorthalidone, lost about 14 lbs (144 > 128). He states he has always been a picky eater and will choose not to eat if the food is not to his liking. However, his appetite has improved over the last couple of weeks. Thinks it is because his body is used to the chlorthalidone now. He has been eating a balanced diet after recent TIA and does note that he has regained some weight. Weight today has indeed stabilized, back up to 134 lbs. Will continue to trend weights at subsequent visits. He is agreeable to colonoscopy for colon cancer screening.  Plan: -trend weight at subsequent visit to ensure continued up-trend -referral to GI for colonoscopy

## 2022-01-15 NOTE — Progress Notes (Signed)
Subjective:   [name] is a 71 y.o. male who presents for Medicare Annual/Subsequent preventive examination.  Review of Systems   Defer to pcp       Objective:    Today's Vitals   01/15/22 0826  BP: 110/80  Pulse: 81  Temp: 98.2 F (36.8 C)  TempSrc: Oral  SpO2: 100%  Weight: 134 lb 9.6 oz (61.1 kg)  Height: '6\' 1"'$  (1.854 m)   Body mass index is 17.76 kg/m.     01/15/2022    9:44 AM 01/15/2022    8:29 AM 12/25/2021    9:13 AM 11/02/2021    5:53 PM 10/16/2021    9:26 AM 01/28/2021    9:03 AM 12/30/2020   10:59 AM  Advanced Directives  Does Patient Have a Medical Advance Directive? No No No No No No No  Would patient like information on creating a medical advance directive? No - Patient declined No - Patient declined No - Patient declined No - Patient declined No - Patient declined No - Patient declined No - Patient declined    Current Medications (verified) Outpatient Encounter Medications as of 01/15/2022  Medication Sig   albuterol (VENTOLIN HFA) 108 (90 Base) MCG/ACT inhaler Inhale 2 puffs into the lungs every 6 (six) hours as needed for wheezing or shortness of breath.   aspirin EC 81 MG tablet Take 1 tablet (81 mg total) by mouth daily. Swallow whole.   cetirizine (ZYRTEC ALLERGY) 10 MG tablet Take 1 tablet (10 mg total) by mouth daily.   chlorthalidone (HYGROTON) 25 MG tablet Take 1 tablet (25 mg total) by mouth daily.   clopidogrel (PLAVIX) 75 MG tablet Take 1 tablet (75 mg total) by mouth daily.   sildenafil (VIAGRA) 100 MG tablet Take 0.5 tablets (50 mg total) by mouth as needed for erectile dysfunction.   varenicline (CHANTIX CONTINUING MONTH PAK) 1 MG tablet Take 1 tablet (1 mg total) by mouth 2 (two) times daily. (Patient not taking: Reported on 01/05/2022)   No facility-administered encounter medications on file as of 01/15/2022.    Allergies (verified) Ace inhibitors   History: Past Medical History:  Diagnosis Date   Atrial tachycardia  04/11/2012   CEREBROVASCULAR ACCIDENT 10/17/2008   Acute CVA of right thalamus 10/03/08 ECHO performed 2/2 CVA 10/09/08, EF 55-65%  Aspirin 325 mg daily and try to work on quitting smoking.    Emphysema    per multiple CXRs   Hypertension    goal < 140/90   Sickle cell trait (HCC)    Snoring    Sleep study 09/02/10 was WNL   Stroke [facility]) 10/03/08   Right thalamus   Tuberculosis at age 45-3   Past Surgical History:  Procedure Laterality Date   APPENDECTOMY  1977   PACEMAKER INSERTION  07/24/08   MDT Adapta L implanted by Dr Blanch Media   VASECTOMY  1990   Family History  Problem Relation Age of Onset   Cancer Sister        Unknown   Hypertension Mother    Sickle cell trait Son    Sickle cell anemia Son    Social History   Socioeconomic History   Marital status: Married    Spouse name: Not on file   Number of children: 6   Years of education: 15   Highest education level: Not on file  Occupational History   Occupation: Disabled    Comment: PT Landscaper at [employer]  Tobacco Use   Smoking status: Every  Day    Packs/day: 0.30    Years: 10.00    Total pack years: 3.00    Types: Cigarettes   Smokeless tobacco: Never   Tobacco comments:    Cutting back. DOWN TO 3-4 CIAGARETTES  Vaping Use   Vaping Use: Never used  Substance and Sexual Activity   Alcohol use: No    Alcohol/week: 0.0 standard drinks of alcohol   Drug use: No    Comment: previous polysubstance abuser, quit x 13 years   Sexual activity: Not on file  Other Topics Concern   Not on file  Social History Narrative   Current Social History 06/29/2019        Patient lives with spouse in a ground floor apartment which is 1 story. There are not steps up to the entrance the patient uses.       Patient's method of transportation is personal car.      The highest level of education was Bachelor's Degree; now working on [employer].      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      Identified important Relationships are "My wife"      Pets : None       Interests / Fun: Computers, "I'm an Optician, dispensing."       Current Stressors: "I hardly let anything bother me. I walk away"       Religious / Personal Beliefs: [employer] Holiness       L. [place]atte, BSN, RN-BC       Social Determinants of Health   Financial Resource Strain: Medium Risk (01/15/2022)   Overall Financial Resource Strain (CARDIA)    Difficulty of Paying Living Expenses: Somewhat hard  Food Insecurity: Food Insecurity Present (01/15/2022)   Hunger Vital Sign    Worried About Running Out of Food in the Last Year: Often true    Ran Out of Food in the Last Year: Sometimes true  Transportation Needs: No Transportation Needs (01/15/2022)   PRAPARE - Hydrologist (Medical): No    Lack of Transportation (Non-Medical): No  Physical Activity: Insufficiently Active (01/15/2022)   Exercise Vital Sign    Days of Exercise per Week: 3 days    Minutes of Exercise per Session: 20 min  Stress: Stress Concern Present (01/15/2022)   [place]    Feeling of Stress : To some extent  Social Connections: Socially Integrated (01/15/2022)   Social Connection and Isolation Panel [NHANES]    Frequency of Communication with Friends and Family: More than three times a week    Frequency of Social Gatherings with Friends and Family: Once a week    Attends Religious Services: More than 4 times per year    Active Member of [employer] or Organizations: Yes    Attends Archivist Meetings: Never    Marital Status: Married    Tobacco Counseling Ready to quit: Not Answered Counseling given: Not Answered Tobacco comments: Cutting back. DOWN TO 3-4 CIAGARETTES   Clinical Intake:  Pre-visit preparation completed: Yes  Pain : No/denies pain     Nutritional Status: BMI <19   Underweight Nutritional Risks: Unintentional weight loss Diabetes: No  How often do you need to have someone help you when you read instructions, pamphlets, or other written materials from your doctor or pharmacy?: 1 - Never  Diabetic?no  Interpreter Needed?: No  Information entered by :: [email or web address],cma   Activities of Daily Living    01/15/2022    8:29 AM 12/25/2021    9:12 AM  In your present state of health, do you have any difficulty performing the following activities:  Hearing? 0 0  Vision? 0 0  Difficulty concentrating or making decisions? 0 0  Walking or climbing stairs? 0 0  Dressing or bathing? 0 0  Doing errands, shopping? 0 0    Patient Care Team: [name], DO as PCP - General (Internal Medicine) Mealor, Yetta Barre, MD as PCP - Electrophysiology (Cardiology)  Indicate any recent Medical Services you may have received from other than [facility] providers in the past year (date may be approximate).     Assessment:   This is a routine wellness examination for [place].  Hearing/Vision screen No results found.  Dietary issues and exercise activities discussed:     Goals Addressed   None   Depression Screen    01/15/2022    9:47 AM 12/25/2021    9:12 AM 10/16/2021    9:25 AM 01/28/2021    9:03 AM 12/30/2020   10:59 AM 12/03/2020    4:27 PM 06/26/2020    3:38 PM  PHQ 2/9 Scores  PHQ - 2 Score 0 0 0 0 0 0 0  PHQ- 9 Score     4 0 0    Fall Risk    01/15/2022    8:29 AM 12/25/2021    9:12 AM 10/16/2021    9:25 AM 01/28/2021    9:02 AM 12/03/2020    4:27 PM  [place] in the past year? 0 0 0 0 0  Number falls in past yr: 0 0 0 0 0  Injury with Fall? 0 0 0  0  Risk for fall due to : No Fall Risks No Fall Risks No Fall Risks    Follow up Falls evaluation completed Falls evaluation completed;Falls prevention discussed Falls evaluation completed;Falls prevention discussed Falls evaluation completed Falls evaluation completed    FALL RISK  PREVENTION PERTAINING TO THE HOME:  Any stairs in or around the home? No  If so, are there any without handrails? No  Home free of loose throw rugs in walkways, pet beds, electrical cords, etc? Yes  Adequate lighting in your home to reduce risk of falls? Yes   ASSISTIVE DEVICES UTILIZED TO PREVENT FALLS:  Life alert? Yes  Use of a cane, walker or w/c? No  Grab bars in the bathroom? Yes  Shower chair or bench in shower? No  Elevated toilet seat or a handicapped toilet? No   TIMED UP AND GO:  Was the test performed? No .  Length of time to ambulate 10 feet: 0 sec.   Gait steady and fast without use of assistive device  Cognitive Function:        Immunizations Immunization History  Administered Date(s) Administered   Fluad Quad(high Dose 65+) 12/30/2020, 12/25/2021   Influenza Split 01/13/2011, 11/25/2011   Influenza,inj,Quad PF,6+ Mos 11/03/2012, 12/21/2013, 01/04/2015, 12/11/2015, 11/26/2016, 11/29/2017, 01/02/2019, 12/21/2019   PFIZER(Purple Top)SARS-COV-2 Vaccination 07/03/2019, 07/24/2019   Pneumococcal Conjugate-13 07/19/2014   Pneumococcal Polysaccharide-23 04/23/2016   Td 09/30/2009    TDAP status: Up to date  Flu Vaccine status: Up to date  Pneumococcal vaccine status: Up to date  Covid-19 vaccine status: Completed vaccines  Qualifies for Shingles Vaccine? Yes   Zostavax completed No   Shingrix Completed?: No.    Education has been  provided regarding the importance of this vaccine. Patient has been advised to call insurance company to determine out of pocket expense if they have not yet received this vaccine. Advised may also receive vaccine at local pharmacy or Health Dept. Verbalized acceptance and understanding.  Screening Tests Health Maintenance  Topic Date Due   Zoster Vaccines- Shingrix (1 of 2) Never done   COVID-19 Vaccine (3 - Pfizer series) 09/18/2019   TETANUS/TDAP  10/01/2019   COLONOSCOPY (Pts 45-74yr Insurance coverage will need to be  confirmed)  07/15/2021   Medicare Annual Wellness (AWV)  01/16/2023   Pneumonia Vaccine 71 Years old  Completed   INFLUENZA VACCINE  Completed   Hepatitis C Screening  Completed   HPV VACCINES  Aged Out    Health Maintenance  Health Maintenance Due  Topic Date Due   Zoster Vaccines- Shingrix (1 of 2) Never done   COVID-19 Vaccine (3 - Pfizer series) 09/18/2019   TETANUS/TDAP  10/01/2019   COLONOSCOPY (Pts 45-479yrInsurance coverage will need to be confirmed)  07/15/2021    Colorectal cancer screening: Referral to GI placed 01/15/2022. Pt aware the office will call re: appt.  Lung Cancer Screening: (Low Dose CT Chest recommended if Age 73-80ears, 30 pack-year currently smoking OR have quit w/in 15years.) does qualify.   Lung Cancer Screening Referral: CT Chest ordered by pcp during last  visit  Additional Screening:  Hepatitis C Screening: does not qualify; Completed 04/23/2016  Vision Screening: Recommended annual ophthalmology exams for early detection of glaucoma and other disorders of the eye. Is the patient up to date with their annual eye exam?  Yes  Who is the provider or what is the name of the office in which the patient attends annual eye exams? [facility] pt is not established with a provider, would they like to be referred to a provider to establish care?  N/A .   Dental Screening: Recommended annual dental exams for proper oral hygiene  Community Resource Referral / Chronic Care Management: CRR required this visit?  Yes   CCM required this visit?  No      Plan:     I have personally reviewed and noted the following in the patient's chart:   Medical and social history Use of alcohol, tobacco or illicit drugs  Current medications and supplements including opioid prescriptions. Patient is not currently taking opioid prescriptions. Functional ability and status Nutritional status Physical activity Advanced directives List of other  physicians Hospitalizations, surgeries, and ER visits in previous 12 months Vitals Screenings to include cognitive, depression, and falls Referrals and appointments  In addition, I have reviewed and discussed with patient certain preventive protocols, quality metrics, and best practice recommendations. A written personalized care plan for preventive services as well as general preventive health recommendations were provided to patient.     Go[place]icoletta [place] 01/15/2022   Nurse Notes: face to face 20 minutes  Mr. DiGreenstreet Thank you for taking time to come for your Medicare Wellness Visit. I appreciate your ongoing commitment to your health goals. Please review the following plan we discussed and let me know if I can assist you in the future.   These are the goals we discussed:  Goals       Blood Pressure < 140/90      Continue riding bike [place] twice weekly x 25-30 minutes (pt-stated)      Quit Smoking      Information provided  on [place] Quitline and smoking cessation classes at [facility]         This is a list of the screening recommended for you and due dates:  Health Maintenance  Topic Date Due   Zoster (Shingles) Vaccine (1 of 2) Never done   COVID-19 Vaccine (3 - Pfizer series) 09/18/2019   Tetanus Vaccine  10/01/2019   Colon Cancer Screening  07/15/2021   Medicare Annual Wellness Visit  01/16/2023   Pneumonia Vaccine  Completed   Flu Shot  Completed   Hepatitis C Screening: USPSTF Recommendation to screen - Ages 18-79 yo.  Completed   HPV Vaccine  Aged Out

## 2022-01-15 NOTE — Progress Notes (Signed)
   CC: f/u of weight loss  HPI:  [name] is a 71 y.o. Male with history listed below presenting to the [facility] for f/u of weight loss. Please see individualized problem based charting for full HPI.  Past Medical History:  Diagnosis Date   Atrial tachycardia 04/11/2012   CEREBROVASCULAR ACCIDENT 10/17/2008   Acute CVA of right thalamus 10/03/08 ECHO performed 2/2 CVA 10/09/08, EF 55-65%  Aspirin 325 mg daily and try to work on quitting smoking.    Emphysema    per multiple CXRs   Hypertension    goal < 140/90   Sickle cell trait (HCC)    Snoring    Sleep study 09/02/10 was WNL   Stroke [facility]) 10/03/08   Right thalamus   Tuberculosis at age 17-3    Review of Systems:  Negative aside from that listed in individualized problem based charting.  Physical Exam:  Vitals:   01/15/22 0826  BP: 110/80  Pulse: 82  Temp: 98.2 F (36.8 C)  TempSrc: Oral  SpO2: 100%  Weight: 134 lb 9.6 oz (61.1 kg)  Height: '6\' 1"'$  (1.854 m)   Physical Exam Constitutional:      Appearance: Normal appearance. He is not ill-appearing.     Comments: thin  HENT:     Mouth/Throat:     Mouth: Mucous membranes are moist.     Pharynx: Oropharynx is clear.  Eyes:     Extraocular Movements: Extraocular movements intact.     Conjunctiva/sclera: Conjunctivae normal.  Cardiovascular:     Rate and Rhythm: Normal rate and regular rhythm.     Heart sounds: No murmur heard.    No friction rub. No gallop.  Pulmonary:     Effort: Pulmonary effort is normal.     Breath sounds: Normal breath sounds. No wheezing, rhonchi or rales.  Abdominal:     General: Bowel sounds are normal. There is no distension.     Palpations: Abdomen is soft.     Tenderness: There is no abdominal tenderness.  Musculoskeletal:        General: Normal range of motion.  Skin:    General: Skin is warm and dry.  Neurological:     General: No focal deficit present.     Mental Status: He is alert and oriented to person, place, and time.   Psychiatric:        Mood and Affect: Mood normal.        Behavior: Behavior normal.      Assessment & Plan:   See Encounters Tab for problem based charting.  Patient discussed with Dr. Philipp Ovens

## 2022-01-15 NOTE — Assessment & Plan Note (Signed)
He continues to smoke cigarettes and is not currently interested in quitting. He has been thinking more about cutting down and eventually quitting, but not ready yet. We discussed that this is biggest modifiable risk factor in the context of possible recent TIA. Also encouraged him to choose a quit date. Has not started chantix yet.

## 2022-01-15 NOTE — Assessment & Plan Note (Signed)
He has remained adherent with his aspirin, plavix (last dose yesterday), amlodipine-atorvastatin, and chlorthalidone. Pacemaker interrogation without abnormalities. BP at goal. Patient's largest modifiable risk factor remains inability to stop tobacco use. He denies any recurrence of similar symptoms.

## 2022-01-15 NOTE — Addendum Note (Signed)
Addended by: Marcelino Duster on: 01/15/2022 11:56 AM   Modules accepted: Orders

## 2022-01-15 NOTE — Assessment & Plan Note (Signed)
Currently taking amlodipine-lipitor 10-'80mg'$  daily and chlorthalidone '25mg'$  daily for management of HTN. BP at goal today at 110/80.  -continue current medications

## 2022-01-15 NOTE — Patient Instructions (Signed)
Health Maintenance, Male Adopting a healthy lifestyle and getting preventive care are important in promoting health and wellness. Ask your health care provider about: The right schedule for you to have regular tests and exams. Things you can do on your own to prevent diseases and keep yourself healthy. What should I know about diet, weight, and exercise? Eat a healthy diet  Eat a diet that includes plenty of vegetables, fruits, low-fat dairy products, and lean protein. Do not eat a lot of foods that are high in solid fats, added sugars, or sodium. Maintain a healthy weight Body mass index (BMI) is a measurement that can be used to identify possible weight problems. It estimates body fat based on height and weight. Your health care provider can help determine your BMI and help you achieve or maintain a healthy weight. Get regular exercise Get regular exercise. This is one of the most important things you can do for your health. Most adults should: Exercise for at least 150 minutes each week. The exercise should increase your heart rate and make you sweat (moderate-intensity exercise). Do strengthening exercises at least twice a week. This is in addition to the moderate-intensity exercise. Spend less time sitting. Even light physical activity can be beneficial. Watch cholesterol and blood lipids Have your blood tested for lipids and cholesterol at 73 years of age, then have this test every 5 years. You may need to have your cholesterol levels checked more often if: Your lipid or cholesterol levels are high. You are older than 73 years of age. You are at high risk for heart disease. What should I know about cancer screening? Many types of cancers can be detected early and may often be prevented. Depending on your health history and family history, you may need to have cancer screening at various ages. This may include screening for: Colorectal cancer. Prostate cancer. Skin cancer. Lung  cancer. What should I know about heart disease, diabetes, and high blood pressure? Blood pressure and heart disease High blood pressure causes heart disease and increases the risk of stroke. This is more likely to develop in people who have high blood pressure readings or are overweight. Talk with your health care provider about your target blood pressure readings. Have your blood pressure checked: Every 3-5 years if you are 18-39 years of age. Every year if you are 40 years old or older. If you are between the ages of 65 and 75 and are a current or former smoker, ask your health care provider if you should have a one-time screening for abdominal aortic aneurysm (AAA). Diabetes Have regular diabetes screenings. This checks your fasting blood sugar level. Have the screening done: Once every three years after age 45 if you are at a normal weight and have a low risk for diabetes. More often and at a younger age if you are overweight or have a high risk for diabetes. What should I know about preventing infection? Hepatitis B If you have a higher risk for hepatitis B, you should be screened for this virus. Talk with your health care provider to find out if you are at risk for hepatitis B infection. Hepatitis C Blood testing is recommended for: Everyone born from 1945 through 1965. Anyone with known risk factors for hepatitis C. Sexually transmitted infections (STIs) You should be screened each year for STIs, including gonorrhea and chlamydia, if: You are sexually active and are younger than 73 years of age. You are older than 73 years of age and your   health care provider tells you that you are at risk for this type of infection. Your sexual activity has changed since you were last screened, and you are at increased risk for chlamydia or gonorrhea. Ask your health care provider if you are at risk. Ask your health care provider about whether you are at high risk for HIV. Your health care provider  may recommend a prescription medicine to help prevent HIV infection. If you choose to take medicine to prevent HIV, you should first get tested for HIV. You should then be tested every 3 months for as long as you are taking the medicine. Follow these instructions at home: Alcohol use Do not drink alcohol if your health care provider tells you not to drink. If you drink alcohol: Limit how much you have to 0-2 drinks a day. Know how much alcohol is in your drink. In the [place] one drink equals one 12 oz bottle of beer (355 mL), one 5 oz glass of wine (148 mL), or one 1 oz glass of hard liquor (44 mL). Lifestyle Do not use any products that contain nicotine or tobacco. These products include cigarettes, chewing tobacco, and vaping devices, such as e-cigarettes. If you need help quitting, ask your health care provider. Do not use street drugs. Do not share needles. Ask your health care provider for help if you need support or information about quitting drugs. General instructions Schedule regular health, dental, and eye exams. Stay current with your vaccines. Tell your health care provider if: You often feel depressed. You have ever been abused or do not feel safe at home. Summary Adopting a healthy lifestyle and getting preventive care are important in promoting health and wellness. Follow your health care provider's instructions about healthy diet, exercising, and getting tested or screened for diseases. Follow your health care provider's instructions on monitoring your cholesterol and blood pressure. This information is not intended to replace advice given to you by your health care provider. Make sure you discuss any questions you have with your health care provider. Document Revised: 07/15/2020 Document Reviewed: 07/15/2020 Elsevier Patient Education  2023 Elsevier Inc.  

## 2022-01-15 NOTE — Progress Notes (Signed)
  Care Coordination  Outreach Note  01/15/2022 Name: [name] MRN: [id number] DOB: Apr 22, 1950   Care Coordination Outreach Attempts: An unsuccessful telephone outreach was attempted today to offer the patient information about available care coordination services as a benefit of their health plan.   Follow Up Plan:  Additional outreach attempts will be made to offer the patient care coordination information and services.   Encounter Outcome:  No Answer  [place]  Direct Dial: [phone]

## 2022-01-16 NOTE — Addendum Note (Signed)
Addended by: Velna Ochs R on: 01/16/2022 09:00 AM   Modules accepted: Level of Service

## 2022-01-16 NOTE — Progress Notes (Signed)
Internal Medicine Clinic Attending  Case discussed with Dr. Jinwala  At the time of the visit.  We reviewed the resident's history and exam and pertinent patient test results.  I agree with the assessment, diagnosis, and plan of care documented in the resident's note.  

## 2022-01-21 NOTE — Progress Notes (Signed)
  Care Coordination  Outreach Note  01/21/2022 Name: DAMONEY JULIA MRN: [id number] DOB: 1950-11-16   Care Coordination Outreach Attempts: A second unsuccessful outreach was attempted today to offer the patient with information about available care coordination services as a benefit of their health plan.     Follow Up Plan:  Additional outreach attempts will be made to offer the patient care coordination information and services.   Encounter Outcome:  No Answer  [place]  Direct Dial: [phone]

## 2022-01-27 NOTE — Progress Notes (Signed)
I reviewed the AWV findings with the provider who conducted the visit. I was present in the office suite and immediately available to provide assistance and direction throughout the time the service was provided.  

## 2022-01-27 NOTE — Progress Notes (Signed)
  Care Coordination  Outreach Note  01/27/2022 Name: NESBIT MICHON MRN: [id number] DOB: [birth year]   Care Coordination Outreach Attempts: A third unsuccessful outreach was attempted today to offer the patient with information about available care coordination services as a benefit of their health plan.   Follow Up Plan:  No further outreach attempts will be made at this time. We have been unable to contact the patient to offer or enroll patient in care coordination services  Encounter Outcome:  No Answer  [place]: [phone]

## 2022-03-31 ENCOUNTER — Ambulatory Visit: Payer: 59 | Attending: Cardiovascular Disease

## 2022-04-06 ENCOUNTER — Ambulatory Visit (INDEPENDENT_AMBULATORY_CARE_PROVIDER_SITE_OTHER): Payer: 59 | Admitting: Internal Medicine

## 2022-04-06 ENCOUNTER — Encounter (HOSPITAL_COMMUNITY): Payer: Self-pay

## 2022-04-06 ENCOUNTER — Other Ambulatory Visit: Payer: Self-pay

## 2022-04-06 ENCOUNTER — Encounter: Payer: Self-pay | Admitting: Internal Medicine

## 2022-04-06 VITALS — BP 100/81 | HR 82 | Temp 97.9°F | Resp 30 | Ht 73.0 in | Wt 139.4 lb

## 2022-04-06 DIAGNOSIS — G459 Transient cerebral ischemic attack, unspecified: Secondary | ICD-10-CM

## 2022-04-06 MED ORDER — AMLODIPINE-ATORVASTATIN 10-80 MG PO TABS: 1.0000 | ORAL_TABLET | Freq: Every day | ORAL | 11 refills | Status: DC

## 2022-04-06 MED ORDER — CLOPIDOGREL BISULFATE 75 MG PO TABS: 75.0000 mg | ORAL_TABLET | Freq: Every day | ORAL | 0 refills | Status: DC

## 2022-04-06 MED ORDER — ASPIRIN 81 MG PO TBEC: 81.0000 mg | DELAYED_RELEASE_TABLET | Freq: Every day | ORAL | 2 refills | Status: DC

## 2022-04-06 NOTE — Patient Instructions (Addendum)
Dear Mr. Francesconi,  Thank you for trusting [place] with your care. We discussed your arm weakness and numbness.  We would like to direct admit you to the hospital for a stroke workup. The hospital will call you once a bed is available. Please make sure you continue to take your amlodipine 10 and atorvastatin '80mg'$ .

## 2022-04-06 NOTE — Progress Notes (Signed)
   CC: hand numbness  HPI:Mr.ARTIS BEGGS is a 72 y.o. male who presents for evaluation of hand numbness. Please see individual problem based A/P for details.  Hx migraine without aura, htn, tia, DDD, SA node dysfunction with pacemaker, HLD  48 - 27 - [place] (wife)  Depression, PHQ-9: Based on the patients  [place] Visit from 12/30/2020 in [place]  PHQ-9 Total Score 4      score we have .  Past Medical History:  Diagnosis Date   Atrial tachycardia 04/11/2012   CEREBROVASCULAR ACCIDENT 10/17/2008   Acute CVA of right thalamus 10/03/08 ECHO performed 2/2 CVA 10/09/08, EF 55-65%  Aspirin 325 mg daily and try to work on quitting smoking.    Emphysema    per multiple CXRs   Hypertension    goal < 140/90   Sickle cell trait (HCC)    Snoring    Sleep study 09/02/10 was WNL   Stroke [facility]) 10/03/08   Right thalamus   Tuberculosis at age 9-3   Review of Systems:   See hpi  Physical Exam: Vitals:   04/06/22 0910 04/06/22 1102  BP: 107/84 100/81  Pulse: 93 82  Resp: (!) 30   Temp: 97.9 F (36.6 C)   TempSrc: Oral   SpO2: 100%   Weight: 139 lb 6.4 oz (63.2 kg)   Height: '6\' 1"'$  (1.854 m)    General: nad HEENT: Conjunctiva nl , antiicteric sclerae, moist mucous membranes, no exudate or erythema Cardiovascular: Normal rate, regular rhythm.  No murmurs, rubs, or gallops Pulmonary : Equal breath sounds, No wheezes, rales, or rhonchi Abdominal: soft, nontender,  bowel sounds present Ext: No edema in lower extremities, no tenderness to palpation of lower extremities.  His neuro exam is normal with no obvious deficits in CN. Speech fluent. No facial droop. Sensation intact. strength/sensation testing in BL upper and lower extremities intact proximally and distally. Grip strength 5/5 BL  Assessment & Plan:   See Encounters Tab for problem based charting.  Patient seen with Dr.  Cain Sieve

## 2022-04-06 NOTE — Assessment & Plan Note (Signed)
Patient presenting for evaluation of ongoing intermittent sensorimotor deficits. Patient reports he had TIA in October with left leg weakness. Has had episodes of left arm weakness and paraesthesias. Most recently, he reports weakness in his left hand with numbness where he dropped his coffee cup three days ago. Says that this has been ongoing off and on for some time. Typically occurs once or twice per month. Symptoms have always resolved. Asymptomatic today. Left hand back to normal. He is concerned that he is having mini strokes. Patient had been evaluated by Dr. Heber  in October after leg weakness. There was concern for TIA at that time. Direct admit to hospital was suggested for expedited TIA workup, but patient had prior commitment and declined. MRI brain and neck imaging were ordered but never completed. He is agreeable to direct admit today. Of note, patient reports associated headache posterior right side that precedes each episode of weakness. He denies any photophobia, nausea, vomiting, diarrhea, rhinorrhea or tearing with these headaches.   On exam, he is resting comfortably. Heart is RRR without MRG. His neuro exam is normal with no obvious deficits in CN or strength/sensation testing in BL upper and lower extremities.   Concern for TIA. Patient has multiple risk factors including HTN. HLD, hx tobacco use, and prior SA node dysfunction requiring pacemaker. Less likely is migraine with neurologic manifestations. Will plan for direct admit to expedite this workup.  He will need to have his pacer interrogated. He does follow with cardiology. Would also like to obtain MRI if pacer is compatible. Will continue aspirin, statin, and caduet (amlodipine-atorvastatin 10-80)

## 2022-04-07 ENCOUNTER — Encounter (HOSPITAL_COMMUNITY): Payer: Self-pay | Admitting: Student in an Organized Health Care Education/Training Program

## 2022-04-07 ENCOUNTER — Observation Stay (HOSPITAL_COMMUNITY)
Admission: AD | Admit: 2022-04-07 | Discharge: 2022-04-09 | Disposition: A | Discharge status: Home / Self Care | Payer: 59 | Source: Other Acute Inpatient Hospital | Attending: Student in an Organized Health Care Education/Training Program | Admitting: Student in an Organized Health Care Education/Training Program

## 2022-04-07 ENCOUNTER — Observation Stay (HOSPITAL_COMMUNITY): Payer: 59

## 2022-04-07 ENCOUNTER — Other Ambulatory Visit: Payer: Self-pay

## 2022-04-07 DIAGNOSIS — Z950 Presence of cardiac pacemaker: Secondary | ICD-10-CM | POA: Diagnosis present

## 2022-04-07 DIAGNOSIS — Z681 Body mass index (BMI) 19 or less, adult: Secondary | ICD-10-CM | POA: Insufficient documentation

## 2022-04-07 DIAGNOSIS — E785 Hyperlipidemia, unspecified: Secondary | ICD-10-CM | POA: Diagnosis present

## 2022-04-07 DIAGNOSIS — I10 Essential (primary) hypertension: Secondary | ICD-10-CM | POA: Diagnosis not present

## 2022-04-07 DIAGNOSIS — F17210 Nicotine dependence, cigarettes, uncomplicated: Secondary | ICD-10-CM | POA: Diagnosis not present

## 2022-04-07 DIAGNOSIS — Z7902 Long term (current) use of antithrombotics/antiplatelets: Secondary | ICD-10-CM | POA: Diagnosis not present

## 2022-04-07 DIAGNOSIS — R531 Weakness: Secondary | ICD-10-CM | POA: Diagnosis present

## 2022-04-07 DIAGNOSIS — G459 Transient cerebral ischemic attack, unspecified: Principal | ICD-10-CM | POA: Diagnosis present

## 2022-04-07 DIAGNOSIS — Z720 Tobacco use: Secondary | ICD-10-CM | POA: Diagnosis present

## 2022-04-07 DIAGNOSIS — E440 Moderate protein-calorie malnutrition: Secondary | ICD-10-CM | POA: Diagnosis not present

## 2022-04-07 DIAGNOSIS — J439 Emphysema, unspecified: Secondary | ICD-10-CM | POA: Diagnosis not present

## 2022-04-07 DIAGNOSIS — Z833 Family history of diabetes mellitus: Secondary | ICD-10-CM | POA: Insufficient documentation

## 2022-04-07 DIAGNOSIS — Z8673 Personal history of transient ischemic attack (TIA), and cerebral infarction without residual deficits: Secondary | ICD-10-CM | POA: Diagnosis not present

## 2022-04-07 DIAGNOSIS — D573 Sickle-cell trait: Secondary | ICD-10-CM | POA: Diagnosis not present

## 2022-04-07 LAB — COMPREHENSIVE METABOLIC PANEL
ALT: 24 U/L (ref 0–44)
AST: 39 U/L (ref 15–41)
Albumin: 3.7 g/dL (ref 3.5–5.0)
Alkaline Phosphatase: 129 U/L — ABNORMAL HIGH (ref 38–126)
Anion gap: 13 (ref 5–15)
BUN: 13 mg/dL (ref 8–23)
CO2: 24 mmol/L (ref 22–32)
Calcium: 9 mg/dL (ref 8.9–10.3)
Chloride: 100 mmol/L (ref 98–111)
Creatinine, Ser: 0.79 mg/dL (ref 0.61–1.24)
GFR, Estimated: >60 mL/min (ref >60)
Glucose, Bld: 88 mg/dL (ref 70–99)
Potassium: 3.7 mmol/L (ref 3.5–5.1)
Sodium: 137 mmol/L (ref 135–145)
Total Bilirubin: 0.7 mg/dL (ref 0.3–1.2)
Total Protein: 6.9 g/dL (ref 6.5–8.1)

## 2022-04-07 LAB — CBC
HCT: 36.7 % — ABNORMAL LOW (ref 39.0–52.0)
Hemoglobin: 12.7 g/dL — ABNORMAL LOW (ref 13.0–17.0)
MCH: 29.9 pg (ref 26.0–34.0)
MCHC: 34.6 g/dL (ref 30.0–36.0)
MCV: 86.4 fL (ref 80.0–100.0)
Platelets: 206 10*3/uL (ref 150–400)
RBC: 4.25 MIL/uL (ref 4.22–5.81)
RDW: 15 % (ref 11.5–15.5)
WBC: 3.9 10*3/uL — ABNORMAL LOW (ref 4.0–10.5)
nRBC: 0 % (ref 0.0–0.2)

## 2022-04-07 LAB — LIPID PANEL
Cholesterol: 193 mg/dL (ref 0–200)
HDL: 51 mg/dL (ref >40)
LDL Cholesterol: 128 mg/dL — ABNORMAL HIGH (ref 0–99)
Total CHOL/HDL Ratio: 3.8 RATIO
Triglycerides: 69 mg/dL (ref <150)
VLDL: 14 mg/dL (ref 0–40)

## 2022-04-07 LAB — MAGNESIUM: Magnesium: 1.8 mg/dL (ref 1.7–2.4)

## 2022-04-07 LAB — PHOSPHORUS: Phosphorus: 3.1 mg/dL (ref 2.5–4.6)

## 2022-04-07 LAB — HEMOGLOBIN A1C
Hgb A1c MFr Bld: 5.3 % (ref 4.8–5.6)
Mean Plasma Glucose: 105.41 mg/dL

## 2022-04-07 MED ORDER — ENOXAPARIN SODIUM 40 MG/0.4ML IJ SOSY
40.0000 mg | PREFILLED_SYRINGE | INTRAMUSCULAR | Status: DC
  Administered 2022-04-07 – 2022-04-08 (×2): 40 mg via SUBCUTANEOUS
  Filled 2022-04-07 (×2): qty 0.4

## 2022-04-07 MED ORDER — ATORVASTATIN CALCIUM 80 MG PO TABS
80.0000 mg | ORAL_TABLET | Freq: Every day | ORAL | Status: DC
  Administered 2022-04-07 – 2022-04-09 (×3): 80 mg via ORAL
  Filled 2022-04-07 (×3): qty 1

## 2022-04-07 MED ORDER — AMLODIPINE BESYLATE 10 MG PO TABS: 10.0000 mg | ORAL_TABLET | Freq: Every day | ORAL | Status: DC

## 2022-04-07 MED ORDER — CHLORTHALIDONE 25 MG PO TABS: 25.0000 mg | ORAL_TABLET | Freq: Every day | ORAL | Status: DC

## 2022-04-07 MED ORDER — ASPIRIN 81 MG PO TBEC
81.0000 mg | DELAYED_RELEASE_TABLET | Freq: Every day | ORAL | Status: DC
  Administered 2022-04-07 – 2022-04-09 (×3): 81 mg via ORAL
  Filled 2022-04-07 (×3): qty 1

## 2022-04-07 MED ORDER — CLOPIDOGREL BISULFATE 75 MG PO TABS
75.0000 mg | ORAL_TABLET | Freq: Every day | ORAL | Status: DC
  Administered 2022-04-07 – 2022-04-09 (×3): 75 mg via ORAL
  Filled 2022-04-07 (×3): qty 1

## 2022-04-07 NOTE — Progress Notes (Signed)
Internal Medicine Clinic Attending  I saw and evaluated the patient.  I personally confirmed the key portions of the history and exam documented by Dr. Elliot Gurney and I reviewed pertinent patient test results.  The assessment, diagnosis, and plan were formulated together and I agree with the documentation in the resident's note.    72yo man with HTN, HLD, h/o tobacco use, and prior SA node dysfunction s/p pacemaker placement who is presenting for evaluation of intermittent left arm weakness and tingling. We are concerned that these are TIAs. In reviewing PCP Dr. Jodene Nam note from October 12/2021, he was also concerned that patient had a TIA when he experienced transient left leg weakness. At that time, Dr. Heber [place] had offered direct admission for expedited workup, which patient declined due to prior commitments. Dr. Heber [place] had ordered outpatient workup (brain MRI, carotid duplex), but patient has not completed this yet.  Today, ABCD score is 4, indicating a moderate risk of TIA. I think patient would greatly benefit from expedited workup of what I think are TIAs, and he agrees with plan for direct admission.  In the meantime, continue Aspirin '81mg'$  daily, Plavix '75mg'$  daily, and Atorvastatin '80mg'$  daily. We confirmed a working phone number for patient today. He will expect a call from the hospital.  Once admitted please: - Consider Neurology consultation - MRI brain - Carotid Doppers (or equivalent carotid imaging) - Coordinate pacemaker interrogation - Consider TTE

## 2022-04-07 NOTE — Progress Notes (Signed)
CT not done yet, ok per primary team to give Aspirin, Plavix and Lovenox.

## 2022-04-07 NOTE — H&P (Cosign Needed)
Date: 04/07/2022               Patient Name:  [name] MRN: [id number]  DOB: [birth year] Age / Sex: 72 y.o., male   PCP: Craig Groves, DO         Medical Service: Internal Medicine Teaching Service         Attending Physician: Dr. Evette Reyes, [name], *    First Contact: Dr. Marlene Reyes [name] Pager: [phone]  Second Contact: Dr. Farrel Reyes Pager: [phone]       After Hours (After 5p/  First Contact Pager: [phone]  weekends / holidays): Second Contact Pager: [phone]   Chief Complaint: intermittent L arm weakness and tingling  History of Present Illness: [name]. [name] is a 72 y.o. person with past medical history of CVA in 2010, emphysema, HTN, tobacco use disorder, prior SA node dysfunction s/p pacemaker placement, sickle cell trait, who presents with L arm weakness and altered sensation admitted for TIA work-up.  Patient states over the last several months he has several instances of having altered sensation in his arms and legs. Typically the altered sensation occurs in his arm but has occurred in his leg once time. The sensation is a sudden-onset numbness and the arm is dead weight requiring him to use the other arm to move the limb. He feels an episode coming on now. This happens once a month.   He was holding a container of ground coffee and dropped it the night before last night which is what prompted him to be evaluated. Before that, it occurred around 2 weeks ago. Overall this has been going on for 2-3 months.  No vision changes, altered smell or taste. He does at time of headaches on the R side of his head into R side of his neck, often associated with the headache on the R side of his head.   He has struggled with appetite and PO intake as a result. He lost 29 pounds over two weeks recently. He denies changes in bowel habits, diarrhea, constipation. He denies incontinence of bowel or bladder.  Meds:  Albuterol inhaler, 2 puffs q6h  PRN Amlodipine-atorvastatin 10-80 mg daily ASA 81 mg daily Cetirizine 10 mg daily Chlorthalidone 25 mg daily Clopidogrel 75 mg daily    Allergies: Allergies as of 04/06/2022 - Review Complete 04/06/2022  Allergen Reaction Noted   Ace inhibitors Swelling and Other (See Comments) 07/26/2013   Past Medical History:  Diagnosis Date   Atrial tachycardia 04/11/2012   CEREBROVASCULAR ACCIDENT 10/17/2008   Acute CVA of right thalamus 10/03/08 ECHO performed 2/2 CVA 10/09/08, EF 55-65%  Aspirin 325 mg daily and try to work on quitting smoking.    Emphysema    per multiple CXRs   Hypertension    goal < 140/90   Sickle cell trait (HCC)    Snoring    Sleep study 09/02/10 was WNL   Stroke [name]) 10/03/08   Right thalamus   Tuberculosis at age 79-3    Family History:  Mother: HTN Sister: cancer, HTN, diabetes Sister: cancer Son: sickle cell trait Son: sickle cell disease  Social History:  Patient lives at home alone with his wife. He is independent in ADLs and IADLs. He currently smokes tobacco, has smoking for around 60 years, 2-3 cigarettes per day. He denies alcohol or illicit substance use. He did use to use illicit substances in 63-01 years. PCP is Dr. Heber [place] [facility] at the [facility].  Review of Systems: A complete  ROS was negative except as per HPI.   Physical Exam: Blood pressure 112/86, pulse 67, temperature 97.6 F (36.4 C), temperature source Oral, resp. rate 18, height '6\' 1"'$  (1.854 m), weight 60.6 kg, SpO2 100 %. Constitutional: Well-appearing elderly male, in no acute distress HENT: normocephalic atraumatic, mucous membranes moist Eyes: conjunctiva non-erythematous, proptosis present bilaterally Neck: supple Cardiovascular: regular rate and rhythm, no m/r/g Pulmonary/Chest: normal work of breathing on room air, expiratory rhonchi heard through air field Abdominal: soft, non-tender, non-distended MSK: normal bulk and tone Neurological: Full strength in sensation in upper and lower  extremities, Cranial nerves in tact, finger to nose testing within normal limits Skin: warm and dry Psych: anxious mood and affect  EKG: Pending.  Assessment & Plan by Problem: Principal Problem:   TIA (transient ischemic attack)  #TIA Patient presented as direct admission due to concern for acute versus subacute TIA. ABCD 2 score is 4, indicating moderate risk of CVA. He has had a stroke previously in 2010. Most recent lipid profile 12/2021 with LDL above goal at 143 despite maximal high-intensity statin therapy. HbA1c at that time was normal, 5.6%. He has a history of hypertension and reports compliance with medications. BP was normal at OV yesterday. His history of prior CVA places him at increased risk of recurrent CVA as well as smoking history and hyperlipidemia. Patient does not have history of migraines or seizures. -Continue ASA 81 mg daily -Continue Plavix 75 mg daily -CT head w/o contrast (pacemaker is not compatible with MRI) -Carotid ultrasound ordered -PT/OT/SLP evaluations ordered -Check lipid profile, HbA1c -Plan to consult neurology if acute CVA on MRI -Patient will need pacemaker interrogated  #HTN Normotensive. He has not taken home medications today. Home regimen includes amlodipine 10 mg daily and chlorthalidone 25 mg daily. -Hold home amlodipine and chlorthalidone at this time; resume as BP necessitates  #HLD Most recent lipid profile with LDL above goal at 143. Patient reports compliance with medications including atorvastatin 80 mg daily. -Check lipid profile -Consider starting zetia  #Emphysema #Tobacco use disorder Home regimen includes albuterol inhaler PRN, and he has not started verenicline though it was prescribed. He is interested in quitting smoking and plans to start varenicline once he discharges from the [facility]. -Continue home albuterol PRN -Continue to encourage tobacco cessation  Dispo: Admit patient to Observation with expected length of  stay less than 2 midnights.  Signed: Drucie Opitz, MD Pager: [phone] After 5pm on weekdays and 1pm on weekends: On Call pager: [phone]

## 2022-04-07 NOTE — Progress Notes (Signed)
Pt refusing IV at this time, no IV fluids or IV meds ordered. Posey Pronto, MD made aware. Pt agreeable to get IV if completely necessary.

## 2022-04-08 ENCOUNTER — Observation Stay (HOSPITAL_BASED_OUTPATIENT_CLINIC_OR_DEPARTMENT_OTHER): Payer: 59

## 2022-04-08 ENCOUNTER — Observation Stay (HOSPITAL_COMMUNITY): Payer: 59

## 2022-04-08 DIAGNOSIS — I999 Unspecified disorder of circulatory system: Secondary | ICD-10-CM | POA: Diagnosis not present

## 2022-04-08 DIAGNOSIS — G459 Transient cerebral ischemic attack, unspecified: Secondary | ICD-10-CM

## 2022-04-08 DIAGNOSIS — E440 Moderate protein-calorie malnutrition: Secondary | ICD-10-CM | POA: Insufficient documentation

## 2022-04-08 LAB — ECHOCARDIOGRAM COMPLETE
AV Mean grad: 2 mmHg
AV Peak grad: 3.9 mmHg
Ao pk vel: 0.99 m/s
Area-P 1/2: 2.91 cm2
Height: 73 in
Weight: 2137.58 oz

## 2022-04-08 LAB — BASIC METABOLIC PANEL
Anion gap: 10 (ref 5–15)
BUN: 9 mg/dL (ref 8–23)
CO2: 25 mmol/L (ref 22–32)
Calcium: 8.7 mg/dL — ABNORMAL LOW (ref 8.9–10.3)
Chloride: 100 mmol/L (ref 98–111)
Creatinine, Ser: 0.84 mg/dL (ref 0.61–1.24)
GFR, Estimated: >60 mL/min (ref >60)
Glucose, Bld: 94 mg/dL (ref 70–99)
Potassium: 3.6 mmol/L (ref 3.5–5.1)
Sodium: 135 mmol/L (ref 135–145)

## 2022-04-08 LAB — CBC
HCT: 34.6 % — ABNORMAL LOW (ref 39.0–52.0)
Hemoglobin: 12.4 g/dL — ABNORMAL LOW (ref 13.0–17.0)
MCH: 30.4 pg (ref 26.0–34.0)
MCHC: 35.8 g/dL (ref 30.0–36.0)
MCV: 84.8 fL (ref 80.0–100.0)
Platelets: 226 10*3/uL (ref 150–400)
RBC: 4.08 MIL/uL — ABNORMAL LOW (ref 4.22–5.81)
RDW: 15 % (ref 11.5–15.5)
WBC: 3.7 10*3/uL — ABNORMAL LOW (ref 4.0–10.5)
nRBC: 0 % (ref 0.0–0.2)

## 2022-04-08 MED ORDER — ENSURE ENLIVE PO LIQD
237.0000 mL | Freq: Three times a day (TID) | ORAL | Status: DC
  Administered 2022-04-08 – 2022-04-09 (×3): 237 mL via ORAL

## 2022-04-08 MED ORDER — IOHEXOL 350 MG/ML SOLN
75.0000 mL | Freq: Once | INTRAVENOUS | Status: AC | PRN
  Administered 2022-04-08: 75 mL via INTRAVENOUS

## 2022-04-08 MED ORDER — EZETIMIBE 10 MG PO TABS
10.0000 mg | ORAL_TABLET | Freq: Every day | ORAL | Status: DC
  Administered 2022-04-09: 10 mg via ORAL
  Filled 2022-04-08: qty 1

## 2022-04-08 NOTE — Progress Notes (Signed)
Initial Nutrition Assessment  DOCUMENTATION CODES:  Non-severe (moderate) malnutrition in context of chronic illness  INTERVENTION:  Liberalize diet to regular, pt with advanced age and with malnutrition Ensure Enlive po TID, each supplement provides 350 kcal and 20 grams of protein.  NUTRITION DIAGNOSIS:   Moderate Malnutrition related to chronic illness (COPD) as evidenced by moderate fat depletion, severe muscle depletion.  GOAL:   Patient will meet greater than or equal to 90% of their needs  MONITOR:   PO intake, Supplement acceptance, Weight trends, Labs  REASON FOR ASSESSMENT:   Malnutrition Screening Tool    ASSESSMENT:   Pt with hx of CVA, COPD (emphysema), HTN, sickle cell, and tobacco use presented to ED with left arm weakness. Admitted for TIA workup  Pt resting in bed at the time of assessment. Lunch tray at bedside noted to be well consumed. Pt reports that he is particular about his food and if it's not what he wants, he will not eat it.   Discussed weight loss. Pt states that he lost a lot of weight and a few months ago his lowest was about 128 lbs. Bed weight taken and pt currently weighs ~140. Usual body weight is ~165 per pt. Will liberalize diet to give pt more options to order. Pt states he is also drinking ensure at home, likes strawberry flavors best.  Nutritionally Relevant Medications: Scheduled Meds:  atorvastatin  80 mg Oral Daily   Labs Reviewed  NUTRITION - FOCUSED PHYSICAL EXAM: Flowsheet Row Most Recent Value  Orbital Region Moderate depletion  Upper Arm Region Moderate depletion  Thoracic and Lumbar Region Moderate depletion  Buccal Region Mild depletion  Temple Region Moderate depletion  Clavicle Bone Region Severe depletion  Clavicle and Acromion Bone Region Severe depletion  Scapular Bone Region Moderate depletion  Dorsal Hand Severe depletion  Patellar Region Severe depletion  Anterior Thigh Region Severe depletion  Posterior  Calf Region Severe depletion  Edema (RD Assessment) None  Hair Reviewed  Eyes Reviewed  Mouth Reviewed  Skin Reviewed  Nails Reviewed   Diet Order:   Diet Order             Diet regular Room service appropriate? Yes; Fluid consistency: Thin  Diet effective now                   EDUCATION NEEDS:   Education needs have been addressed  Skin:  Skin Assessment: Reviewed RN Assessment  Last BM:  1/30  Height:   Ht Readings from Last 1 Encounters:  04/07/22 '6\' 1"'$  (1.854 m)    Weight:   Wt Readings from Last 1 Encounters:  04/08/22 64.6 kg    Ideal Body Weight:  83.6 kg  BMI:  Body mass index is 18.79 kg/m.  Estimated Nutritional Needs:  Kcal:  2000-2200 kcal/d Protein:  90-110g/d Fluid:  2-2.2L/d    Ranell Patrick, RD, LDN Clinical Dietitian RD pager # available in [facility]  After hours/weekend pager # available in [facility]

## 2022-04-08 NOTE — Progress Notes (Signed)
Echocardiogram 2D Echocardiogram has been performed.  [name] 04/08/2022, 2:12 PM

## 2022-04-08 NOTE — Evaluation (Signed)
Physical Therapy Evaluation and Discharge Patient Details Name: [name] MRN: [id number] DOB: [birth year] Today's Date: 04/08/2022  History of Present Illness  Pt is a 72 y.o. M who presents 04/07/2022 with L arm weakness and altered sensation; admitted for TIA work up. CT head negative for acute abnormality; MRI pending. Significant PMH: CVA in 2010, emphysema, HTN, tobacco use disorder, prior SA node dysfunction s/p pacemaker placement, sickle cell trait.  Clinical Impression  Patient evaluated by Physical Therapy with no further acute PT needs identified. Pt reports 2-3 months of intermittent LUE weakness/numbness that lasts ~2 hours in duration; he reports he did not seek medical attention because of church duties. Pt reports currently all symptoms have resolved. Pt ambulating 400 ft with no assistive device independently. Scoring 23/24 on the DGI, indicating he is not at high risk for falls. Education provided regarding BEFAST stroke signs and symptoms. All education has been completed and the patient has no further questions. No follow-up Physical Therapy or equipment needs. PT is signing off. Thank you for this referral.      Recommendations for follow up therapy are one component of a multi-disciplinary discharge planning process, led by the attending physician.  Recommendations may be updated based on patient status, additional functional criteria and insurance authorization.  Follow Up Recommendations No PT follow up      Assistance Recommended at Discharge None  Patient can return home with the following       Equipment Recommendations None recommended by PT  Recommendations for Other Services       Functional Status Assessment Patient has not had a recent decline in their functional status     Precautions / Restrictions Precautions Precautions: None Restrictions Weight Bearing Restrictions: No      Mobility  Bed Mobility Overal bed mobility: Independent                   Transfers Overall transfer level: Independent Equipment used: None                    Ambulation/Gait Ambulation/Gait assistance: Independent Gait Distance (Feet): 400 Feet Assistive device: None Gait Pattern/deviations: WFL(Within Functional Limits)          Stairs            Wheelchair Mobility    Modified Rankin (Stroke Patients Only) Modified Rankin (Stroke Patients Only) Pre-Morbid Rankin Score: No symptoms Modified Rankin: No symptoms     Balance Overall balance assessment: Independent                               Standardized Balance Assessment Standardized Balance Assessment : Dynamic Gait Index   Dynamic Gait Index Level Surface: Normal Change in Gait Speed: Normal Gait with Horizontal Head Turns: Normal Gait with Vertical Head Turns: Normal Gait and Pivot Turn: Normal Step Over Obstacle: Normal Step Around Obstacles: Mild Impairment Steps: Normal Total Score: 23       Pertinent Vitals/Pain Pain Assessment Pain Assessment: No/denies pain    Home Living Family/patient expects to be discharged to:: Private residence Living Arrangements: Spouse/significant other Available Help at Discharge: Family Type of Home: Apartment Home Access: Level entry       Home Layout: One level        Prior Function Prior Level of Function : Independent/Modified Independent             Mobility Comments: does odd jobs i.e. Academic librarian  detailing, working at funeral home. [place] and [place] there       Hand Dominance        Extremity/Trunk Assessment   Upper Extremity Assessment Upper Extremity Assessment: RUE deficits/detail;LUE deficits/detail RUE Deficits / Details: Strength 5/5 RUE Sensation: WNL RUE Coordination: WNL LUE Deficits / Details: Strength 5/5 LUE Sensation: WNL LUE Coordination: WNL    Lower Extremity Assessment Lower Extremity Assessment: RLE  deficits/detail;LLE deficits/detail RLE Deficits / Details: Strength 5/5 RLE Sensation: WNL RLE Coordination: WNL LLE Deficits / Details: Strength 5/5 LLE Sensation: WNL LLE Coordination: WNL    Cervical / Trunk Assessment Cervical / Trunk Assessment: Normal  Communication   Communication: No difficulties  Cognition Arousal/Alertness: Awake/alert Behavior During Therapy: WFL for tasks assessed/performed Overall Cognitive Status: Within Functional Limits for tasks assessed                                          General Comments      Exercises     Assessment/Plan    PT Assessment Patient does not need any further PT services  PT Problem List         PT Treatment Interventions      PT Goals (Current goals can be found in the Care Plan section)  Acute Rehab PT Goals Patient Stated Goal: go home PT Goal Formulation: All assessment and education complete, DC therapy    Frequency       Co-evaluation               AM-PAC PT "6 Clicks" Mobility  Outcome Measure Help needed turning from your back to your side while in a flat bed without using bedrails?: None Help needed moving from lying on your back to sitting on the side of a flat bed without using bedrails?: None Help needed moving to and from a bed to a chair (including a wheelchair)?: None Help needed standing up from a chair using your arms (e.g., wheelchair or bedside chair)?: None Help needed to walk in hospital room?: None Help needed climbing 3-5 steps with a railing? : None 6 Click Score: 24    End of Session   Activity Tolerance: Patient tolerated treatment well Patient left: in bed;with call bell/phone within reach Nurse Communication: Mobility status;Other (comment) (pt independent) PT Visit Diagnosis: Other symptoms and signs involving the nervous system (G64.403)    Time: 4742-5956 PT Time Calculation (min) (ACUTE ONLY): 15 min   Charges:   PT Evaluation $PT Eval Low  Complexity: [place], PT, DPT Acute Rehabilitation Services Office [phone]   Deno Etienne 04/08/2022, 9:08 AM

## 2022-04-08 NOTE — Progress Notes (Signed)
   S: Patient reports feeling well.  Tired of being in the hospital.  Reports full strength in his upper extremities.  Weakness that brought him to the hospital has now resolved.  Eating and drinking well.  Concerned about his weight loss, hoping to get answers.  O:   Today's Vitals   04/08/22 0304 04/08/22 0735 04/08/22 0801 04/08/22 1127  BP: 112/78  119/85 116/89  Pulse: 61  62 62  Resp: '16  18 18  '$ Temp: 98.4 F (36.9 C)  98 F (36.7 C) 98 F (36.7 C)  TempSrc: Oral  Oral Oral  SpO2: 100%  100% 100%  Weight:      Height:      PainSc:  0-No pain     Body mass index is 17.63 kg/m.  Gen: Tired appearing man, lying in bed, no distress Neuro: Alert, conversational, no cranial nerve deficits, full strength in the upper and lower extremities CV: Regular rate and rhythm, 2 out of 6 early systolic murmur at the left upper sternal border Abd: Soft, nontender, nondistended Ext: No lower extremity edema Psych: Depressed appearing affect  Assessment and plan:  Principal Problem:   TIA (transient ischemic attack) Active Problems:   Hyperlipidemia   Essential hypertension   Tobacco abuse   Pacemaker  Ischemic vascular disease: The report of acute left arm weakness could be consistent with either transient cerebral ischemia or a small acute infarction.  Cannot obtain MRI brain due to incompatible pacemaker.  CT head without hemorrhage, will plan to repeat a CT head 24 hours later to rule out acute infarction.  Carotid artery duplex ultrasound negative for large vessel occlusion. -Echocardiogram pending -CT angiogram of the head pending to rule out intracranial LVO -Repeat CT head pending to rule out acute infarction -Medical management with aspirin 81 mg, Plavix 75 mg, and atorvastatin 80 mg daily. -No PT or OT needs at follow-up identified  Hyperlipidemia: Uncontrolled despite maximum intensity atorvastatin.  Will add ezetimibe 10 mg daily.  Hypertension: Well-controlled, holding  antihypertensive medications while evaluating for acute stroke.  Planning to resume chlorthalidone and amlodipine at discharge.  Tobacco cessation: Need to offer the patient Chantix at discharge.  Unintended weight loss: Currently no clear etiology.  Outpatient evaluation over the last year has been negative for acute malignancy.  Lovenox 40 mg daily for DVT prophylaxis  Anticipate discharge to home in the next 1 or 2 days pending completion of workup for stroke/TIA.  Axel Filler, MD 04/08/2022, 2:03 PM

## 2022-04-08 NOTE — Progress Notes (Signed)
OT Cancellation Note  Patient Details Name: [name] MRN: [id number] DOB: 1950/04/02   Cancelled Treatment:    Reason Eval/Treat Not Completed: OT screened, no needs identified, will sign off. PT saw this pt and reports that pt is performing UB and LB ADL independently. Strength 5/5 bilaterally, and sensation equal bilaterally. OT to sign off. Please re-consult if change in status.   Elder Cyphers, OTR/L [facility] Office: [phone]   [name] 04/08/2022, 9:55 AM

## 2022-04-08 NOTE — TOC Initial Note (Signed)
Transition of Care [facility]) - Initial/Assessment Note    Patient Details  Name: [name] MRN: [id number] Date of Birth: 11-15-1950  Transition of [facility]) CM/SW Contact:    Pollie Friar, RN Phone Number: 04/08/2022, 4:01 PM  Clinical Narrative:                 Pt is from home with his spouse. She works during the daytime but home at night.  No DME. Pt drives self and manages his own medications.  No f/u per PT.  TOC following for d/c needs.   Expected Discharge Plan: Home/Self Care Barriers to Discharge: Continued Medical Work up   Patient Goals and CMS Choice            Expected Discharge Plan and Services   Discharge Planning Services: CM Consult   Living arrangements for the past 2 months: Apartment                                      Prior Living Arrangements/Services Living arrangements for the past 2 months: Apartment Lives with:: Spouse Patient language and need for interpreter reviewed:: Yes Do you feel safe going back to the place where you live?: Yes            Criminal Activity/Legal Involvement Pertinent to Current Situation/Hospitalization: No - Comment as needed  Activities of Daily Living Home Assistive Devices/Equipment: Eyeglasses, Blood pressure cuff ADL Screening (condition at time of admission) Patient's cognitive ability adequate to safely complete daily activities?: Yes Is the patient deaf or have difficulty hearing?: No Does the patient have difficulty seeing, even when wearing glasses/contacts?: No Does the patient have difficulty concentrating, remembering, or making decisions?: No Patient able to express need for assistance with ADLs?: Yes Does the patient have difficulty dressing or bathing?: No Independently performs ADLs?: Yes (appropriate for developmental age) Does the patient have difficulty walking or climbing stairs?: No Weakness of Legs: None Weakness of Arms/Hands: None  Permission Sought/Granted                   Emotional Assessment Appearance:: Appears stated age Attitude/Demeanor/Rapport: Engaged Affect (typically observed): Accepting Orientation: : Oriented to Self, Oriented to Place, Oriented to  Time, Oriented to Situation   Psych Involvement: No (comment)  Admission diagnosis:  TIA (transient ischemic attack) [G45.9] Patient Active Problem List   Diagnosis Date Noted   Malnutrition of moderate degree 04/08/2022   TIA (transient ischemic attack) 12/25/2021   Degenerative disc disease, lumbar 10/16/2021   Unintentional weight loss 01/29/2021   Generalized pruritus 12/31/2020   Hepatic lesion 12/06/2020   Cough 12/06/2020   Bilateral flank pain 06/26/2020   De Quervain's tenosynovitis, left 08/10/2019   Primary osteoarthritis of left knee 07/21/2018   Primary osteoarthritis of right knee 04/06/2018   Baker's cyst of knee, left 04/06/2018   TMJ disease 09/17/2017   Sickle cell trait ([place])    Abdominal aortic atherosclerosis ([place]) 04/21/2016   Seasonal allergies 06/15/2014   Renal cyst, right 02/08/2013   History of tuberculosis 01/19/2013   Sinoatrial node dysfunction ([place]) 04/11/2012   Healthcare maintenance 11/26/2011   Pacemaker 11/25/2011   COPD with asthma ([name])    GERD (gastroesophageal reflux disease) 02/04/2011   Tobacco abuse 07/01/2010   Hyperlipidemia 09/30/2009   MIGRAINE WITHOUT AURA 09/30/2009   ERECTILE DYSFUNCTION, ORGANIC 01/16/2009   Essential hypertension 11/22/2008   PCP:  He[place],  Rachel Moulds, DO Pharmacy:   [facility] [address], [place] [address] [place] [address] Phone: [phone] Fax: [place] [address] [place] [address] Phone: [phone] Fax: [phone]  [id number] [place] [address] [id number]:  [phone]: [phone]    Social Determinants of Health (SDOH) Social History: [place] No Food Insecurity (04/07/2022)  Recent Concern: [place] (01/15/2022)  Housing: Medium Risk (04/07/2022)  Transportation Needs: No Transportation Needs (04/07/2022)  Utilities: Not At Risk (04/07/2022)  Alcohol Screen: Low Risk  (01/15/2022)  Depression (PHQ2-9): Low Risk  (01/15/2022)  Financial Resource Strain: Medium Risk (01/15/2022)  Physical Activity: Insufficiently Active (01/15/2022)  Social Connections: Socially Integrated (01/15/2022)  Stress: Stress Concern Present (01/15/2022)  Tobacco Use: High Risk (04/07/2022)   SDOH Interventions:     Readmission Risk Interventions     No data to display

## 2022-04-08 NOTE — Care Management Obs Status (Signed)
[place] NOTIFICATION   Patient Details  Name: [name] MRN: [id number] Date of Birth: [birth year]   Medicare Observation Status Notification Given:  Yes    Pollie Friar, RN 04/08/2022, 3:21 PM

## 2022-04-09 DIAGNOSIS — I999 Unspecified disorder of circulatory system: Secondary | ICD-10-CM | POA: Diagnosis not present

## 2022-04-09 DIAGNOSIS — G459 Transient cerebral ischemic attack, unspecified: Secondary | ICD-10-CM | POA: Diagnosis not present

## 2022-04-09 LAB — CBC
HCT: 35.4 % — ABNORMAL LOW (ref 39.0–52.0)
Hemoglobin: 12.2 g/dL — ABNORMAL LOW (ref 13.0–17.0)
MCH: 29.8 pg (ref 26.0–34.0)
MCHC: 34.5 g/dL (ref 30.0–36.0)
MCV: 86.3 fL (ref 80.0–100.0)
Platelets: 213 10*3/uL (ref 150–400)
RBC: 4.1 MIL/uL — ABNORMAL LOW (ref 4.22–5.81)
RDW: 15 % (ref 11.5–15.5)
WBC: 4.4 10*3/uL (ref 4.0–10.5)
nRBC: 0 % (ref 0.0–0.2)

## 2022-04-09 LAB — BASIC METABOLIC PANEL
Anion gap: 8 (ref 5–15)
BUN: 18 mg/dL (ref 8–23)
CO2: 26 mmol/L (ref 22–32)
Calcium: 9.1 mg/dL (ref 8.9–10.3)
Chloride: 101 mmol/L (ref 98–111)
Creatinine, Ser: 0.89 mg/dL (ref 0.61–1.24)
GFR, Estimated: >60 mL/min (ref >60)
Glucose, Bld: 99 mg/dL (ref 70–99)
Potassium: 3.9 mmol/L (ref 3.5–5.1)
Sodium: 135 mmol/L (ref 135–145)

## 2022-04-09 MED ORDER — EZETIMIBE 10 MG PO TABS: 10.0000 mg | ORAL_TABLET | Freq: Every day | ORAL | 2 refills | Status: DC

## 2022-04-09 NOTE — TOC Transition Note (Signed)
Transition of Care [facility]) - CM/SW Discharge Note   Patient Details  Name: [name] MRN: [id number] Date of Birth: 05/16/1950  Transition of Care [facility]) CM/SW Contact:  Pollie Friar, RN Phone Number: 04/09/2022, 12:16 PM   Clinical Narrative:    Pt is discharging home with self care. No needs per TOC. Pt has transportation home.    Final next level of care: Home/Self Care Barriers to Discharge: No Barriers Identified   Patient Goals and CMS Choice      Discharge Placement                         Discharge Plan and Services Additional resources added to the After Visit Summary for     Discharge Planning Services: CM Consult                                 Social Determinants of Health (SDOH) Interventions SDOH Screenings   Food Insecurity: No Food Insecurity (04/07/2022)  Recent Concern: [place] Present (01/15/2022)  Housing: Medium Risk (04/07/2022)  Transportation Needs: No Transportation Needs (04/07/2022)  Utilities: Not At Risk (04/07/2022)  Alcohol Screen: Low Risk  (01/15/2022)  Depression (PHQ2-9): Low Risk  (01/15/2022)  Financial Resource Strain: Medium Risk (01/15/2022)  Physical Activity: Insufficiently Active (01/15/2022)  Social Connections: Socially Integrated (01/15/2022)  Stress: Stress Concern Present (01/15/2022)  Tobacco Use: High Risk (04/07/2022)     Readmission Risk Interventions     No data to display

## 2022-04-09 NOTE — Discharge Instructions (Addendum)
You were hospitalized for left arm weakness. Imaging did not show any findings of new stroke. Weakness has resolved. We will start a new daily medication to help control your cholesterol levels called Zetia. Please continue working on stopping smoking. You will follow up with our clinic next week. Thank you for allowing [place] to be part of your care.   We arranged for you to follow up at:  [facility] (located on the ground floor of this hospital) 04/16/2022 8:45 AM   Please note these changes made to your medications:  *Please START taking:  -Zetia 10 mg daily  Please make sure to follow up with your primary care doctor after this recent hospitalization.   Please call our clinic if you have any questions or concerns, we may be able to help and keep you from a long and expensive emergency room wait. Our clinic and after hours phone number is [phone], the best time to call is Monday through Friday 9 am to 4 pm but there is always someone available 24/7 if you have an emergency. If you need medication refills please notify your pharmacy one week in advance and they will send [place] a request.

## 2022-04-09 NOTE — Discharge Summary (Signed)
Name: [name] MRN: [id number] DOB: [birth year] 72 y.o. PCP: Craig Groves, DO  Date of Admission: 04/07/2022  4:41 PM Date of Discharge: 04/09/22 Attending Physician: Dr. Evette Doffing  Discharge Diagnosis: Principal Problem:   TIA (transient ischemic attack) Active Problems:   Hyperlipidemia   Essential hypertension   Tobacco abuse   Pacemaker   Malnutrition of moderate degree    Discharge Medications: Allergies as of 04/09/2022       Reactions   Ace Inhibitors Swelling, Other (See Comments)   Patient presented with right upper lip swelling along with tingling and numbness. Possible allergic reaction to ACEI.        Medication List     STOP taking these medications    amLODipine 10 MG tablet Commonly known as: NORVASC       TAKE these medications    amLODipine-atorvastatin 10-80 MG tablet Commonly known as: Caduet Take 1 tablet by mouth daily.   aspirin EC 81 MG tablet Take 1 tablet (81 mg total) by mouth daily. Swallow whole. What changed: additional instructions   cetirizine 10 MG tablet Commonly known as: ZyrTEC Allergy Take 1 tablet (10 mg total) by mouth daily.   chlorthalidone 25 MG tablet Commonly known as: HYGROTON Take 1 tablet (25 mg total) by mouth daily.   clopidogrel 75 MG tablet Commonly known as: PLAVIX Take 1 tablet (75 mg total) by mouth daily.   ezetimibe 10 MG tablet Commonly known as: ZETIA Take 1 tablet (10 mg total) by mouth daily.   sildenafil 100 MG tablet Commonly known as: Viagra Take 0.5 tablets (50 mg total) by mouth as needed for erectile dysfunction.   varenicline 1 MG tablet Commonly known as: Chantix Continuing Month Pak Take 1 tablet (1 mg total) by mouth 2 (two) times daily.        Disposition and follow-up:   [name] was discharged from [facility] in Good condition.  At the hospital follow up visit please address:  1.  Follow-up:  a. Ischemic vascular disease:  assess for symptom reoccurrence and medication compliance    b. HLD: assess adherence to home statin and newly added Zetia   c. Tobacco use: highly motivated and has Chantix at pharmacy    D. HTN: patient may have been taking amlodipine and Caduet prior to admission, disposed of his older amlodipine and he should only be taking Caduet  2.  Labs / imaging needed at time of follow-up: none  3.  Pending labs/ test needing follow-up: none  4.  Medication Changes  -Zetia 10 mg daily  -Disposed of his old amlodipine bottle prior to discharge since he is taking Caduet (amlodipine-atorvastatin)   Follow-up Appointments:  Follow-up Information     Craig Groves, DO. Go to.   Specialty: Internal Medicine Why: Appointment on: 04/16/2022 8:45 AM Contact information: [place] [address] [place] Hospital Course by problem list:  Ischemic vascular disease Hx of CVA 2010 Presented from [facility] for concern of acute vs subacute TIA. Reported having left arm weakness intermittently over last several months. Serial CT head did not show findings of acute stroke. Unable to obtain MRI brain due to incompatible pacemaker. Carotid artery ultrasound and CTA head negative for large vessel occlusion. Echocardiogram EF 65-70% with noted G1 diastolic. LDL above goal. Continued his medical management of aspirin, Plavix and atorvastatin. Addressed risk  factor modifications. His weakness resolved and no focal neuro findings at time of discharge.   HLD Uncontrolled with LDL 128 despite on max atorvastatin. Added ezetimibe during hospital course and will continue at discharge.   HTN Well-controlled. Of note, patient may have been taking amlodipine (bottle dated from 2021) and Caduet which is a combo pill of amlodipine-atorvastatin. Disposed of older amlodipine bottle appropriately prior to his discharge. Patient may resume his chlorthalidone and amlodipine.    Tobacco use  Patient highly motivated to quit. Has Chantix at pharmacy that was prescribed previously. Will continue smoking cessation at f/u clinic visits.   Unintentional weight loss Patient has lost about 40 lbs over last 18 months. Unclear etiology. BMI 18.79. Lung cancer screening in 2022 was lung  RADS 2 benign appearance. Outpatient eval has so far been negative for acute malignancy. Patient can continue follow up with his PCP.     Discharge Subjective: Patient feeling well this morning. No further left arm numbness. States he has neck and headache when the numbness comes on previously. Expressed he is motivated about smoking cessation.   Discharge Exam:   BP 137/80 (BP Location: Left Arm)   Pulse 60   Temp 98.6 F (37 C) (Oral)   Resp 18   Ht '6\' 1"'$  (1.854 m)   Wt 64.6 kg Comment: bed weight  SpO2 100%   BMI 18.79 kg/m  Constitutional: well-appearing male sitting up in bed, in no acute distress HENT: normocephalic atraumatic Neck: supple, non-tender, normal ROM Cardiovascular: regular rate  Pulmonary/Chest: normal work of breathing on room air MSK: normal bulk and tone Neurological: alert, 5/5 strength in bilateral UE, sensation intact bilateral UE Skin: warm and dry Psych: pleasant mood  -Negative Spurling test   Pertinent Labs, Studies, and Procedures:     Latest Ref Rng & Units 04/09/2022    3:38 AM 04/08/2022    5:28 AM 04/07/2022    7:04 PM  CBC  WBC 4.0 - 10.5 K/uL 4.4  3.7  3.9   Hemoglobin 13.0 - 17.0 g/dL 12.2  12.4  12.7   Hematocrit 39.0 - 52.0 % 35.4  34.6  36.7   Platelets 150 - 400 K/uL 213  226  206        Latest Ref Rng & Units 04/09/2022    3:38 AM 04/08/2022    5:28 AM 04/07/2022    7:04 PM  CMP  Glucose 70 - 99 mg/dL 99  94  88   BUN 8 - 23 mg/dL '18  9  13   '$ Creatinine 0.61 - 1.24 mg/dL 0.89  0.84  0.79   Sodium 135 - 145 mmol/L 135  135  137   Potassium 3.5 - 5.1 mmol/L 3.9  3.6  3.7   Chloride 98 - 111 mmol/L 101  100  100   CO2 22 - 32  mmol/L '26  25  24   '$ Calcium 8.9 - 10.3 mg/dL 9.1  8.7  9.0   Total Protein 6.5 - 8.1 g/dL   6.9   Total Bilirubin 0.3 - 1.2 mg/dL   0.7   Alkaline Phos 38 - 126 U/L   129   AST 15 - 41 U/L   39   ALT 0 - 44 U/L   24     CT ANGIO HEAD W OR WO CONTRAST  Result Date: 04/08/2022 CLINICAL DATA:  Evaluate for acute infarct, cannot get an MRI EXAM: CT ANGIOGRAPHY HEAD TECHNIQUE: Multidetector CT imaging of the head was  performed using the standard protocol during bolus administration of intravenous contrast. Multiplanar CT image reconstructions and MIPs were obtained to evaluate the vascular anatomy. RADIATION DOSE REDUCTION: This exam was performed according to the departmental dose-optimization program which includes automated exposure control, adjustment of the mA and/or kV according to patient size and/or use of iterative reconstruction technique. CONTRAST:  47m OMNIPAQUE IOHEXOL 350 MG/ML SOLN COMPARISON:  No prior CTA available, correlation is made with CT head 04/07/2022 and MRA 10/04/2008 FINDINGS: CT HEAD Brain: No evidence of acute infarct, hemorrhage, mass, mass effect, or midline shift. No hydrocephalus or extra-axial fluid collection. Redemonstrated remote infarct in right basal ganglia and corona radiata, with likely additional remote infarct in the centrum semiovale, with ex vacuo dilatation of the right anterior body of the lateral ventricle. Vascular: No hyperdense vessel. Skull: Normal. Negative for fracture or focal lesion. Sinuses/Orbits: No acute finding. Other: The mastoid air cells are well aerated. CTA HEAD FINDINGS Anterior circulation: Both internal carotid arteries are patent to the termini, without significant stenosis. A1 segments patent. Normal anterior communicating artery. Anterior cerebral arteries are patent to their distal aspects. Moderate to severe stenosis in the mid right M1 and moderate stenosis in the proximal and distal left M1. Multifocal irregularity in the bilateral  MCA branches, particularly an M2 or M3 branch in the insula (series 11, images 113-115). MCA branches perfused to their distal aspects, with slightly less opacification of the right MCA branches than the left. Posterior circulation: Vertebral arteries patent to the vertebrobasilar junction without stenosis. Posterior inferior cerebellar arteries patent proximally. Severe stenosis in the proximal basilar artery (series 11, image 144). Superior cerebellar arteries patent proximally. Patent P1 segments, hypoplastic on the left. Near fetal origin of the left PCA from the left posterior communicating artery. The right posterior communicating artery is not visualized. The PCAs are irregular bilaterally, with mild stenosis in the proximal left P2 (series 11, image 117. Venous sinuses: Not well opacified due to phase of contrast. Anatomic variants: Near fetal origin of the left PCA. Review of the MIP images confirms the above findings IMPRESSION: 1. No acute intracranial process. Remote infarcts in the right basal ganglia and corona radiata. 2. No intracranial large vessel occlusion. 3. Severe stenosis in the proximal basilar artery, moderate to severe stenosis in the mid right M1, moderate stenosis in the proximal and distal left M1, and mild stenosis in the proximal left P2. 4. Multifocal irregularity in the bilateral MCA and PCA branches. Electronically Signed   By: AMerilyn BabaM.D.   On: 04/08/2022 20:11   ECHOCARDIOGRAM COMPLETE  Result Date: 04/08/2022    ECHOCARDIOGRAM REPORT   Patient Name:   NARKIN IMRANDate of Exam: 04/08/2022 Medical Rec #:  [id number]        Height:       73.0 in Accession #:    [id number]       Weight:       133.6 lb Date of Birth:  1December 02, 1952        BSA:          1.813 m Patient Age:    89years          BP:           119/85 mmHg Patient Gender: M                 HR:           66 bpm. Exam Location:  Inpatient Procedure: 2D  Echo, Cardiac Doppler and Color Doppler Indications:     TIA G45.9  History:        Patient has no prior history of Echocardiogram examinations.                 Pacemaker, TIA and COPD; Risk Factors:Hypertension, Dyslipidemia                 and Current Smoker.  Sonographer:    Ronny Flurry Referring Phys: [id number] [place]  1. Left ventricular ejection fraction, by estimation, is 65 to 70%. The left ventricle has normal function. The left ventricle has no regional wall motion abnormalities. Left ventricular diastolic parameters are consistent with Grade I diastolic dysfunction (impaired relaxation).  2. Right ventricular systolic function is normal. The right ventricular size is normal. There is normal pulmonary artery systolic pressure.  3. The mitral valve is normal in structure. No evidence of mitral valve regurgitation. No evidence of mitral stenosis.  4. The aortic valve is normal in structure. Aortic valve regurgitation is not visualized. No aortic stenosis is present.  5. The inferior vena cava is normal in size with <50% respiratory variability, suggesting right atrial pressure of 8 mmHg. FINDINGS  Left Ventricle: Left ventricular ejection fraction, by estimation, is 65 to 70%. The left ventricle has normal function. The left ventricle has no regional wall motion abnormalities. The left ventricular internal cavity size was normal in size. There is  no left ventricular hypertrophy. Left ventricular diastolic parameters are consistent with Grade I diastolic dysfunction (impaired relaxation). Indeterminate filling pressures. Right Ventricle: The right ventricular size is normal. No increase in right ventricular wall thickness. Right ventricular systolic function is normal. There is normal pulmonary artery systolic pressure. The tricuspid regurgitant velocity is 2.14 m/s, and  with an assumed right atrial pressure of 8 mmHg, the estimated right ventricular systolic pressure is 70.0 mmHg. Left Atrium: Left atrial size was normal in size. Right  Atrium: Right atrial size was normal in size. Pericardium: There is no evidence of pericardial effusion. Mitral Valve: The mitral valve is normal in structure. No evidence of mitral valve regurgitation. No evidence of mitral valve stenosis. Tricuspid Valve: The tricuspid valve is normal in structure. Tricuspid valve regurgitation is trivial. No evidence of tricuspid stenosis. Aortic Valve: The aortic valve is normal in structure. Aortic valve regurgitation is not visualized. No aortic stenosis is present. Aortic valve mean gradient measures 2.0 mmHg. Aortic valve peak gradient measures 3.9 mmHg. Pulmonic Valve: The pulmonic valve was normal in structure. Pulmonic valve regurgitation is not visualized. No evidence of pulmonic stenosis. Aorta: The aortic root is normal in size and structure. Venous: The inferior vena cava is normal in size with less than 50% respiratory variability, suggesting right atrial pressure of 8 mmHg. IAS/Shunts: No atrial level shunt detected by color flow Doppler. Additional Comments: A device lead is visualized.   Diastology LV e' medial:   5.75 cm/s LV E/e' medial: 10.5  IVC IVC diam: 1.90 cm RIGHT ATRIUM           Index RA Area:     15.40 cm RA Volume:   38.50 ml  21.24 ml/m  AORTIC VALVE AV Vmax:           98.80 cm/s AV Vmean:          65.200 cm/s AV VTI:            0.201 m AV Peak Grad:      3.9 mmHg AV  Mean Grad:      2.0 mmHg LVOT Vmax:         88.63 cm/s LVOT Vmean:        57.100 cm/s LVOT VTI:          0.174 m LVOT/AV VTI ratio: 0.86 MITRAL VALVE               TRICUSPID VALVE MV Area (PHT): 2.91 cm    TR Peak grad:   18.3 mmHg MV Decel Time: 261 msec    TR Vmax:        214.00 cm/s MV E velocity: 60.20 cm/s MV A velocity: 74.90 cm/s  SHUNTS MV E/A ratio:  0.80        Systemic VTI: 0.17 m Skeet Latch MD Electronically signed by Skeet Latch MD Signature Date/Time: 04/08/2022/2:38:15 PM    Final    VAS US CAROTID  Result Date: 04/08/2022 Carotid Arterial Duplex Study  Patient Name:  ZAMIR STAPLES  Date of Exam:   04/08/2022 Medical Rec #: [id number]          Accession #:    [id number] Date of Birth: [birth year]          Patient Gender: M Patient Age:   77 years Exam Location:  [facility] Procedure:      VAS US CAROTID Referring Phys: Velna Ochs --------------------------------------------------------------------------------  Indications:       TIA. Risk Factors:      Hypertension, hyperlipidemia, prior CVA. Comparison Study:  No prior studies. Performing Technologist: Darlin Coco RDMS, RVT  Examination Guidelines: A complete evaluation includes B-mode imaging, spectral Doppler, color Doppler, and power Doppler as needed of all accessible portions of each vessel. Bilateral testing is considered an integral part of a complete examination. Limited examinations for reoccurring indications may be performed as noted.  Right Carotid Findings: +----------+--------+--------+--------+------------------+------------------+           PSV cm/sEDV cm/sStenosisPlaque DescriptionComments           +----------+--------+--------+--------+------------------+------------------+ CCA Prox  86      27                                                   +----------+--------+--------+--------+------------------+------------------+ CCA Distal57      17                                intimal thickening +----------+--------+--------+--------+------------------+------------------+ ICA Prox  89      29      1-39%   heterogenous                         +----------+--------+--------+--------+------------------+------------------+ ICA Mid   51      24                                                   +----------+--------+--------+--------+------------------+------------------+ ICA Distal48      22                                                   +----------+--------+--------+--------+------------------+------------------+  ECA       63      19                                                    +----------+--------+--------+--------+------------------+------------------+ +----------+--------+-------+----------------+-------------------+           PSV cm/sEDV cmsDescribe        Arm Pressure (mmHG) +----------+--------+-------+----------------+-------------------+ [id number]             Multiphasic, WNL                    +----------+--------+-------+----------------+-------------------+ +---------+--------+--+--------+--+---------+ VertebralPSV cm/s31EDV cm/s14Antegrade +---------+--------+--+--------+--+---------+  Left Carotid Findings: +----------+--------+--------+--------+------------------+------------------+           PSV cm/sEDV cm/sStenosisPlaque DescriptionComments           +----------+--------+--------+--------+------------------+------------------+ CCA Prox  60      19                                intimal thickening +----------+--------+--------+--------+------------------+------------------+ CCA Distal54      18                                intimal thickening +----------+--------+--------+--------+------------------+------------------+ ICA Prox  47      21      1-39%   heterogenous                         +----------+--------+--------+--------+------------------+------------------+ ICA Mid   53      21                                                   +----------+--------+--------+--------+------------------+------------------+ ICA Distal61      17                                                   +----------+--------+--------+--------+------------------+------------------+ ECA       59      15                                                   +----------+--------+--------+--------+------------------+------------------+ +----------+--------+--------+----------------+-------------------+           PSV cm/sEDV cm/sDescribe        Arm Pressure (mmHG)  +----------+--------+--------+----------------+-------------------+ [id number]              Multiphasic, WNL                    +----------+--------+--------+----------------+-------------------+ +---------+--------+--+--------+--+---------+ VertebralPSV cm/s33EDV cm/s11Antegrade +---------+--------+--+--------+--+---------+   Summary: Right Carotid: Velocities in the right ICA are consistent with a 1-39% stenosis. Left Carotid: Velocities in the left ICA are consistent with a 1-39% stenosis. Vertebrals:  Bilateral vertebral arteries demonstrate antegrade flow. Subclavians: Normal flow hemodynamics were seen in bilateral subclavian  arteries. *See table(s) above for measurements and observations.  Electronically signed by Antony Contras MD on 04/08/2022 at 10:45:09 AM.    Final    CT HEAD WO CONTRAST (5MM)  Result Date: 04/07/2022 CLINICAL DATA:  Transient ischemic attack EXAM: CT HEAD WITHOUT CONTRAST TECHNIQUE: Contiguous axial images were obtained from the base of the skull through the vertex without intravenous contrast. RADIATION DOSE REDUCTION: This exam was performed according to the departmental dose-optimization program which includes automated exposure control, adjustment of the mA and/or kV according to patient size and/or use of iterative reconstruction technique. COMPARISON:  08/11/2018 FINDINGS: Brain: No mass, hemorrhage or extra-axial collection. Old right centrum semiovale small vessel infarct with mild ex vacuo dilatation of the right lateral ventricle. CSF spaces are otherwise normal. White matter is otherwise normal. Vascular: No hyperdense vessel or unexpected calcification. Skull: Negative Sinuses/Orbits: Normal Other: None IMPRESSION: No acute CT finding. Old right centrum semiovale small vessel infarct with mild ex vacuo dilatation of the right lateral ventricle. Electronically Signed   By: Ulyses Jarred M.D.   On: 04/07/2022 22:43     Discharge  Instructions: Discharge Instructions     Call MD for:  difficulty breathing, headache or visual disturbances   Complete by: As directed    Call MD for:  extreme fatigue   Complete by: As directed    Call MD for:  hives   Complete by: As directed    Call MD for:  persistant dizziness or light-headedness   Complete by: As directed    Call MD for:  persistant nausea and vomiting   Complete by: As directed    Call MD for:  redness, tenderness, or signs of infection (pain, swelling, redness, odor or green/yellow discharge around incision site)   Complete by: As directed    Call MD for:  severe uncontrolled pain   Complete by: As directed    Call MD for:  temperature >100.4   Complete by: As directed    Diet - low sodium heart healthy   Complete by: As directed    Increase activity slowly   Complete by: As directed        Signed: Angelique Blonder, DO 04/09/2022, 11:55 AM   Pager: [phone]

## 2022-04-09 NOTE — Hospital Course (Addendum)
Ischemic vascular disease Hx of CVA 2010 Presented from [facility] for concern of acute vs subacute TIA. Reported having left arm weakness intermittently over last several months. Serial CT head did not show findings of acute stroke. Unable to obtain MRI brain due to incompatible pacemaker. Carotid artery ultrasound and CTA head negative for large vessel occlusion. Echocardiogram EF 65-70% with noted G1 diastolic. LDL above goal. Continued his medical management of aspirin, Plavix and atorvastatin. Addressed risk factor modifications. His weakness resolved and no focal neuro findings at time of discharge.   HLD Uncontrolled with LDL 128 despite on max atorvastatin. Added ezetimibe during hospital course and will continue at discharge.   HTN Well-controlled. Of note, patient may have been taking amlodipine (bottle dated from 2021) and Caduet which is a combo pill of amlodipine-atorvastatin. Disposed of older amlodipine bottle appropriately prior to his discharge. Patient may resume his chlorthalidone and amlodipine.   Tobacco use  Patient highly motivated to quit. Has Chantix at pharmacy that was prescribed previously. Will continue smoking cessation at f/u clinic visits.   Unintentional weight loss Patient has lost about 40 lbs over last 18 months. Unclear etiology. BMI 18.79. Lung cancer screening in 2022 was lung  RADS 2 benign appearance. Outpatient eval has so far been negative for acute malignancy. Patient can continue follow up with his PCP.

## 2022-04-09 NOTE — Plan of Care (Signed)
  Problem: Education: Goal: Knowledge of General Education information will improve Description: Including pain rating scale, medication(s)/side effects and non-pharmacologic comfort measures Outcome: Progressing   Problem: Health Behavior/Discharge Planning: Goal: Ability to manage health-related needs will improve Outcome: Progressing   Problem: Clinical Measurements: Goal: Ability to maintain clinical measurements within normal limits will improve Outcome: Progressing Goal: Will remain free from infection Outcome: Progressing Goal: Diagnostic test results will improve Outcome: Progressing Goal: Respiratory complications will improve Outcome: Progressing Goal: Cardiovascular complication will be avoided Outcome: Progressing   Problem: Activity: Goal: Risk for activity intolerance will decrease Outcome: Progressing   Problem: Nutrition: Goal: Adequate nutrition will be maintained Outcome: Progressing   Problem: Coping: Goal: Level of anxiety will decrease Outcome: Progressing   Problem: Elimination: Goal: Will not experience complications related to bowel motility Outcome: Progressing Goal: Will not experience complications related to urinary retention Outcome: Progressing   Problem: Pain Managment: Goal: General experience of comfort will improve Outcome: Progressing   Problem: Safety: Goal: Ability to remain free from injury will improve Outcome: Progressing   Problem: Skin Integrity: Goal: Risk for impaired skin integrity will decrease Outcome: Progressing   Problem: Education: Goal: Knowledge of disease or condition will improve Outcome: Progressing Goal: Knowledge of secondary prevention will improve (MUST DOCUMENT ALL) Outcome: Progressing Goal: Knowledge of patient specific risk factors will improve [name] N/A or DELETE if not current risk factor) Outcome: Progressing   Problem: Ischemic Stroke/TIA Tissue Perfusion: Goal: Complications of ischemic  stroke/TIA will be minimized Outcome: Progressing   Problem: Coping: Goal: Will verbalize positive feelings about self Outcome: Progressing Goal: Will identify appropriate support needs Outcome: Progressing   Problem: Health Behavior/Discharge Planning: Goal: Ability to manage health-related needs will improve Outcome: Progressing Goal: Goals will be collaboratively established with patient/family Outcome: Progressing   Problem: Self-Care: Goal: Ability to participate in self-care as condition permits will improve Outcome: Progressing Goal: Verbalization of feelings and concerns over difficulty with self-care will improve Outcome: Progressing Goal: Ability to communicate needs accurately will improve Outcome: Progressing   Problem: Nutrition: Goal: Risk of aspiration will decrease Outcome: Progressing Goal: Dietary intake will improve Outcome: Progressing

## 2022-04-09 NOTE — Plan of Care (Signed)
AVS reviewed with pt. Education complete including medications and TIAs. Handout given in AVS. All questions answered. DC reqs completed. Bedside RN aware.

## 2022-04-10 ENCOUNTER — Telehealth: Payer: Self-pay

## 2022-04-10 NOTE — Telephone Encounter (Signed)
Transition Care Management Unsuccessful Follow-up Telephone Call  Date of discharge and from where:  [facility]/03/2022  Attempts:  1st Attempt  Reason for unsuccessful TCM follow-up call:  No answer/busy Juanda Crumble, [place] Direct Dial [phone]

## 2022-04-10 NOTE — Addendum Note (Signed)
Addended by: Joni Reining C on: 04/10/2022 11:14 AM   Modules accepted: Orders

## 2022-04-10 NOTE — Addendum Note (Signed)
Addended by: Joni Reining C on: 04/10/2022 11:13 AM   Modules accepted: Orders

## 2022-04-13 NOTE — Telephone Encounter (Signed)
Transition Care Management Unsuccessful Follow-up Telephone Call  Date of discharge and from where:  [facility] 04/09/2022  Attempts:  2nd Attempt  Reason for unsuccessful TCM follow-up call:  Left voice message Juanda Crumble, [place] Direct Dial [phone]

## 2022-04-14 NOTE — Telephone Encounter (Signed)
Transition Care Management Unsuccessful Follow-up Telephone Call  Date of discharge and from where:  [facility] 04/09/2022  Attempts:  3rd Attempt  Reason for unsuccessful TCM follow-up call:  Left voice message Juanda Crumble, [place] Direct Dial [phone]

## 2022-04-16 ENCOUNTER — Encounter: Payer: 59 | Admitting: Internal Medicine

## 2022-04-16 ENCOUNTER — Telehealth: Payer: Self-pay | Admitting: *Deleted

## 2022-04-16 NOTE — Telephone Encounter (Signed)
Pt did not come his appt this am. Called pt to re-schedule - no answer nor vm.

## 2022-04-23 ENCOUNTER — Encounter (HOSPITAL_COMMUNITY)
Admission: EM | Disposition: A | Discharge status: Home / Self Care | Payer: Self-pay | Source: Home / Self Care | Attending: Neurology

## 2022-04-23 ENCOUNTER — Emergency Department (HOSPITAL_COMMUNITY): Payer: 59

## 2022-04-23 ENCOUNTER — Inpatient Hospital Stay (HOSPITAL_COMMUNITY)
Admission: EM | Admit: 2022-04-23 | Discharge: 2022-04-25 | DRG: 023 | Disposition: A | Discharge status: Home / Self Care | Payer: 59 | Attending: Neurology | Admitting: Neurology

## 2022-04-23 ENCOUNTER — Emergency Department (HOSPITAL_COMMUNITY): Payer: 59 | Admitting: Certified Registered"

## 2022-04-23 ENCOUNTER — Other Ambulatory Visit: Payer: Self-pay

## 2022-04-23 ENCOUNTER — Inpatient Hospital Stay (HOSPITAL_COMMUNITY): Payer: 59

## 2022-04-23 DIAGNOSIS — F17210 Nicotine dependence, cigarettes, uncomplicated: Secondary | ICD-10-CM | POA: Diagnosis present

## 2022-04-23 DIAGNOSIS — Z7982 Long term (current) use of aspirin: Secondary | ICD-10-CM

## 2022-04-23 DIAGNOSIS — I10 Essential (primary) hypertension: Secondary | ICD-10-CM

## 2022-04-23 DIAGNOSIS — W06XXXA Fall from bed, initial encounter: Secondary | ICD-10-CM | POA: Diagnosis present

## 2022-04-23 DIAGNOSIS — H5500 Unspecified nystagmus: Secondary | ICD-10-CM | POA: Diagnosis present

## 2022-04-23 DIAGNOSIS — R29706 NIHSS score 6: Secondary | ICD-10-CM | POA: Diagnosis present

## 2022-04-23 DIAGNOSIS — Z8249 Family history of ischemic heart disease and other diseases of the circulatory system: Secondary | ICD-10-CM | POA: Diagnosis not present

## 2022-04-23 DIAGNOSIS — R29810 Facial weakness: Secondary | ICD-10-CM | POA: Diagnosis present

## 2022-04-23 DIAGNOSIS — I639 Cerebral infarction, unspecified: Principal | ICD-10-CM

## 2022-04-23 DIAGNOSIS — I63511 Cerebral infarction due to unspecified occlusion or stenosis of right middle cerebral artery: Principal | ICD-10-CM | POA: Diagnosis present

## 2022-04-23 DIAGNOSIS — I495 Sick sinus syndrome: Secondary | ICD-10-CM | POA: Diagnosis present

## 2022-04-23 DIAGNOSIS — Y92003 Bedroom of unspecified non-institutional (private) residence as the place of occurrence of the external cause: Secondary | ICD-10-CM | POA: Diagnosis not present

## 2022-04-23 DIAGNOSIS — J449 Chronic obstructive pulmonary disease, unspecified: Secondary | ICD-10-CM | POA: Diagnosis not present

## 2022-04-23 DIAGNOSIS — Z79899 Other long term (current) drug therapy: Secondary | ICD-10-CM

## 2022-04-23 DIAGNOSIS — R29704 NIHSS score 4: Secondary | ICD-10-CM | POA: Diagnosis not present

## 2022-04-23 DIAGNOSIS — Z1152 Encounter for screening for COVID-19: Secondary | ICD-10-CM

## 2022-04-23 DIAGNOSIS — R29707 NIHSS score 7: Secondary | ICD-10-CM | POA: Diagnosis not present

## 2022-04-23 DIAGNOSIS — R29702 NIHSS score 2: Secondary | ICD-10-CM | POA: Diagnosis not present

## 2022-04-23 DIAGNOSIS — Z66 Do not resuscitate: Secondary | ICD-10-CM | POA: Diagnosis not present

## 2022-04-23 DIAGNOSIS — J439 Emphysema, unspecified: Secondary | ICD-10-CM | POA: Diagnosis present

## 2022-04-23 DIAGNOSIS — Z8673 Personal history of transient ischemic attack (TIA), and cerebral infarction without residual deficits: Secondary | ICD-10-CM | POA: Diagnosis present

## 2022-04-23 DIAGNOSIS — E785 Hyperlipidemia, unspecified: Secondary | ICD-10-CM | POA: Diagnosis present

## 2022-04-23 DIAGNOSIS — F1410 Cocaine abuse, uncomplicated: Secondary | ICD-10-CM | POA: Diagnosis present

## 2022-04-23 DIAGNOSIS — Z950 Presence of cardiac pacemaker: Secondary | ICD-10-CM | POA: Diagnosis not present

## 2022-04-23 DIAGNOSIS — Z7902 Long term (current) use of antithrombotics/antiplatelets: Secondary | ICD-10-CM | POA: Diagnosis not present

## 2022-04-23 DIAGNOSIS — F17200 Nicotine dependence, unspecified, uncomplicated: Secondary | ICD-10-CM | POA: Diagnosis not present

## 2022-04-23 DIAGNOSIS — I63311 Cerebral infarction due to thrombosis of right middle cerebral artery: Secondary | ICD-10-CM | POA: Diagnosis not present

## 2022-04-23 DIAGNOSIS — I609 Nontraumatic subarachnoid hemorrhage, unspecified: Secondary | ICD-10-CM | POA: Diagnosis present

## 2022-04-23 DIAGNOSIS — I63512 Cerebral infarction due to unspecified occlusion or stenosis of left middle cerebral artery: Secondary | ICD-10-CM | POA: Diagnosis not present

## 2022-04-23 DIAGNOSIS — D573 Sickle-cell trait: Secondary | ICD-10-CM | POA: Diagnosis present

## 2022-04-23 DIAGNOSIS — G8194 Hemiplegia, unspecified affecting left nondominant side: Secondary | ICD-10-CM | POA: Diagnosis present

## 2022-04-23 HISTORY — PX: IR PERCUTANEOUS ART THROMBECTOMY/INFUSION INTRACRANIAL INC DIAG ANGIO: IMG6087

## 2022-04-23 HISTORY — PX: IR US GUIDE VASC ACCESS RIGHT: IMG2390

## 2022-04-23 HISTORY — PX: RADIOLOGY WITH ANESTHESIA: SHX6223

## 2022-04-23 HISTORY — PX: IR CT HEAD LTD: IMG2386

## 2022-04-23 HISTORY — PX: IR PTA INTRACRANIAL: IMG2344

## 2022-04-23 LAB — COMPREHENSIVE METABOLIC PANEL
ALT: 28 U/L (ref 0–44)
AST: 31 U/L (ref 15–41)
Albumin: 3.8 g/dL (ref 3.5–5.0)
Alkaline Phosphatase: 110 U/L (ref 38–126)
Anion gap: 9 (ref 5–15)
BUN: 7 mg/dL — ABNORMAL LOW (ref 8–23)
CO2: 26 mmol/L (ref 22–32)
Calcium: 9.1 mg/dL (ref 8.9–10.3)
Chloride: 105 mmol/L (ref 98–111)
Creatinine, Ser: 0.93 mg/dL (ref 0.61–1.24)
GFR, Estimated: >60 mL/min (ref >60)
Glucose, Bld: 98 mg/dL (ref 70–99)
Potassium: 3.2 mmol/L — ABNORMAL LOW (ref 3.5–5.1)
Sodium: 140 mmol/L (ref 135–145)
Total Bilirubin: 0.5 mg/dL (ref 0.3–1.2)
Total Protein: 7.4 g/dL (ref 6.5–8.1)

## 2022-04-23 LAB — CBC
HCT: 37.4 % — ABNORMAL LOW (ref 39.0–52.0)
Hemoglobin: 13.2 g/dL (ref 13.0–17.0)
MCH: 30.6 pg (ref 26.0–34.0)
MCHC: 35.3 g/dL (ref 30.0–36.0)
MCV: 86.6 fL (ref 80.0–100.0)
Platelets: 211 10*3/uL (ref 150–400)
RBC: 4.32 MIL/uL (ref 4.22–5.81)
RDW: 14.7 % (ref 11.5–15.5)
WBC: 5 10*3/uL (ref 4.0–10.5)
nRBC: 0 % (ref 0.0–0.2)

## 2022-04-23 LAB — DIFFERENTIAL
Abs Immature Granulocytes: 0 10*3/uL (ref 0.00–0.07)
Basophils Absolute: 0 10*3/uL (ref 0.0–0.1)
Basophils Relative: 0 %
Eosinophils Absolute: 0 10*3/uL (ref 0.0–0.5)
Eosinophils Relative: 0 %
Immature Granulocytes: 0 %
Lymphocytes Relative: 23 %
Lymphs Abs: 1.1 10*3/uL (ref 0.7–4.0)
Monocytes Absolute: 0.2 10*3/uL (ref 0.1–1.0)
Monocytes Relative: 5 %
Neutro Abs: 3.6 10*3/uL (ref 1.7–7.7)
Neutrophils Relative %: 72 %

## 2022-04-23 LAB — URINALYSIS, ROUTINE W REFLEX MICROSCOPIC
Bacteria, UA: NONE SEEN
Bilirubin Urine: NEGATIVE
Glucose, UA: NEGATIVE mg/dL
Ketones, ur: NEGATIVE mg/dL
Leukocytes,Ua: NEGATIVE
Nitrite: NEGATIVE
Protein, ur: NEGATIVE mg/dL
RBC / HPF: >50 RBC/hpf (ref 0–5)
Specific Gravity, Urine: 1.045 — ABNORMAL HIGH (ref 1.005–1.030)
pH: 7 (ref 5.0–8.0)

## 2022-04-23 LAB — RAPID URINE DRUG SCREEN, HOSP PERFORMED
Amphetamines: NOT DETECTED
Barbiturates: NOT DETECTED
Benzodiazepines: NOT DETECTED
Cocaine: POSITIVE — AB
Opiates: NOT DETECTED
Tetrahydrocannabinol: NOT DETECTED

## 2022-04-23 LAB — I-STAT CHEM 8, ED
BUN: 7 mg/dL — ABNORMAL LOW (ref 8–23)
Calcium, Ion: 1.14 mmol/L — ABNORMAL LOW (ref 1.15–1.40)
Chloride: 104 mmol/L (ref 98–111)
Creatinine, Ser: 0.9 mg/dL (ref 0.61–1.24)
Glucose, Bld: 95 mg/dL (ref 70–99)
HCT: 38 % — ABNORMAL LOW (ref 39.0–52.0)
Hemoglobin: 12.9 g/dL — ABNORMAL LOW (ref 13.0–17.0)
Potassium: 3.4 mmol/L — ABNORMAL LOW (ref 3.5–5.1)
Sodium: 143 mmol/L (ref 135–145)
TCO2: 26 mmol/L (ref 22–32)

## 2022-04-23 LAB — PROTIME-INR
INR: 1.1 (ref 0.8–1.2)
Prothrombin Time: 14.4 seconds (ref 11.4–15.2)

## 2022-04-23 LAB — MRSA NEXT GEN BY PCR, NASAL: MRSA by PCR Next Gen: NOT DETECTED

## 2022-04-23 LAB — RESP PANEL BY RT-PCR (RSV, FLU A&B, COVID)  RVPGX2
Influenza A by PCR: NEGATIVE
Influenza B by PCR: NEGATIVE
Resp Syncytial Virus by PCR: NEGATIVE
SARS Coronavirus 2 by RT PCR: NEGATIVE

## 2022-04-23 LAB — CBG MONITORING, ED: Glucose-Capillary: 90 mg/dL (ref 70–99)

## 2022-04-23 LAB — ETHANOL: Alcohol, Ethyl (B): <10 mg/dL (ref <10)

## 2022-04-23 LAB — APTT: aPTT: 29 seconds (ref 24–36)

## 2022-04-23 SURGERY — IR WITH ANESTHESIA
Anesthesia: General

## 2022-04-23 MED ORDER — ASPIRIN 81 MG PO TBEC
81.0000 mg | DELAYED_RELEASE_TABLET | Freq: Once | ORAL | Status: AC
  Administered 2022-04-23: 81 mg via ORAL
  Filled 2022-04-23: qty 1

## 2022-04-23 MED ORDER — SODIUM CHLORIDE 0.9 % IV SOLN
INTRAVENOUS | Status: AC | PRN
  Administered 2022-04-23: 2 ug/kg/min via INTRAVENOUS

## 2022-04-23 MED ORDER — TICAGRELOR 90 MG PO TABS
180.0000 mg | ORAL_TABLET | Freq: Once | ORAL | Status: AC
  Administered 2022-04-23: 180 mg via ORAL
  Filled 2022-04-23: qty 2

## 2022-04-23 MED ORDER — ACETAMINOPHEN 160 MG/5ML PO SOLN: 650.0000 mg | ORAL | Status: DC | PRN

## 2022-04-23 MED ORDER — NITROGLYCERIN 1 MG/10 ML FOR IR/CATH LAB
INTRA_ARTERIAL | Status: AC
  Filled 2022-04-23: qty 10

## 2022-04-23 MED ORDER — ORAL CARE MOUTH RINSE: 15.0000 mL | OROMUCOSAL | Status: DC | PRN

## 2022-04-23 MED ORDER — SODIUM CHLORIDE 0.9 % IV SOLN
2.0000 ug/kg/min | INTRAVENOUS | Status: DC
  Administered 2022-04-23: 2 ug/kg/min via INTRAVENOUS
  Filled 2022-04-23: qty 50

## 2022-04-23 MED ORDER — CLEVIDIPINE BUTYRATE 0.5 MG/ML IV EMUL
INTRAVENOUS | Status: DC | PRN
  Administered 2022-04-23: 2 mg/h via INTRAVENOUS

## 2022-04-23 MED ORDER — PROPOFOL 10 MG/ML IV BOLUS
INTRAVENOUS | Status: DC | PRN
  Administered 2022-04-23: 100 mg via INTRAVENOUS

## 2022-04-23 MED ORDER — IOHEXOL 350 MG/ML SOLN
100.0000 mL | Freq: Once | INTRAVENOUS | Status: AC | PRN
  Administered 2022-04-23: 50 mL via INTRAVENOUS

## 2022-04-23 MED ORDER — NITROGLYCERIN 1 MG/10 ML FOR IR/CATH LAB
INTRA_ARTERIAL | Status: AC | PRN
  Administered 2022-04-23: 200 ug via INTRA_ARTERIAL

## 2022-04-23 MED ORDER — LACTATED RINGERS IV SOLN: INTRAVENOUS | Status: DC | PRN

## 2022-04-23 MED ORDER — SODIUM CHLORIDE 0.9 % IV SOLN: INTRAVENOUS | Status: DC

## 2022-04-23 MED ORDER — STROKE: EARLY STAGES OF RECOVERY BOOK
Freq: Once | Status: DC
  Filled 2022-04-23: qty 1

## 2022-04-23 MED ORDER — LIDOCAINE 2% (20 MG/ML) 5 ML SYRINGE
INTRAMUSCULAR | Status: DC | PRN
  Administered 2022-04-23: 60 mg via INTRAVENOUS

## 2022-04-23 MED ORDER — ROCURONIUM BROMIDE 10 MG/ML (PF) SYRINGE
PREFILLED_SYRINGE | INTRAVENOUS | Status: DC | PRN
  Administered 2022-04-23: 40 mg via INTRAVENOUS
  Administered 2022-04-23: 60 mg via INTRAVENOUS

## 2022-04-23 MED ORDER — SUGAMMADEX SODIUM 200 MG/2ML IV SOLN
INTRAVENOUS | Status: DC | PRN
  Administered 2022-04-23: 400 mg via INTRAVENOUS

## 2022-04-23 MED ORDER — ACETAMINOPHEN 650 MG RE SUPP: 650.0000 mg | RECTAL | Status: DC | PRN

## 2022-04-23 MED ORDER — ACETAMINOPHEN 325 MG PO TABS: 650.0000 mg | ORAL_TABLET | ORAL | Status: DC | PRN

## 2022-04-23 MED ORDER — CHLORHEXIDINE GLUCONATE CLOTH 2 % EX PADS
6.0000 | MEDICATED_PAD | Freq: Every day | CUTANEOUS | Status: DC
  Administered 2022-04-23 – 2022-04-24 (×2): 6 via TOPICAL

## 2022-04-23 MED ORDER — CEFAZOLIN SODIUM-DEXTROSE 2-4 GM/100ML-% IV SOLN
INTRAVENOUS | Status: AC
  Filled 2022-04-23: qty 100

## 2022-04-23 MED ORDER — ACETAMINOPHEN 325 MG PO TABS
650.0000 mg | ORAL_TABLET | ORAL | Status: DC | PRN
  Administered 2022-04-24 – 2022-04-25 (×3): 650 mg via ORAL
  Filled 2022-04-23 (×3): qty 2

## 2022-04-23 MED ORDER — VERAPAMIL HCL 2.5 MG/ML IV SOLN
INTRAVENOUS | Status: AC
  Filled 2022-04-23: qty 2

## 2022-04-23 MED ORDER — CLEVIDIPINE BUTYRATE 0.5 MG/ML IV EMUL
0.0000 mg/h | INTRAVENOUS | Status: DC
  Administered 2022-04-23: 8 mg/h via INTRAVENOUS
  Filled 2022-04-23: qty 50

## 2022-04-23 MED ORDER — CEFAZOLIN SODIUM-DEXTROSE 2-3 GM-%(50ML) IV SOLR
INTRAVENOUS | Status: DC | PRN
  Administered 2022-04-23: 2 g via INTRAVENOUS

## 2022-04-23 MED ORDER — CANGRELOR BOLUS VIA INFUSION
INTRAVENOUS | Status: AC | PRN
  Administered 2022-04-23: 1950 ug via INTRAVENOUS

## 2022-04-23 MED ORDER — CANGRELOR TETRASODIUM 50 MG IV SOLR
INTRAVENOUS | Status: AC
  Filled 2022-04-23: qty 50

## 2022-04-23 MED ORDER — IOHEXOL 300 MG/ML  SOLN
150.0000 mL | Freq: Once | INTRAMUSCULAR | Status: AC | PRN
  Administered 2022-04-23: 120 mL via INTRA_ARTERIAL

## 2022-04-23 MED ORDER — SUCCINYLCHOLINE CHLORIDE 200 MG/10ML IV SOSY
PREFILLED_SYRINGE | INTRAVENOUS | Status: DC | PRN
  Administered 2022-04-23: 140 mg via INTRAVENOUS

## 2022-04-23 MED ORDER — IOHEXOL 350 MG/ML SOLN
100.0000 mL | Freq: Once | INTRAVENOUS | Status: AC | PRN
  Administered 2022-04-23: 100 mL via INTRAVENOUS

## 2022-04-23 MED ORDER — CEFAZOLIN SODIUM-DEXTROSE 2-4 GM/100ML-% IV SOLN
INTRAVENOUS | Status: AC | PRN
  Administered 2022-04-23: 2 g via INTRAVENOUS

## 2022-04-23 NOTE — Progress Notes (Signed)
Reviewed CT Head post IR which demonstrates a combination of contrast and some SAH. Discussed with Dr. Karenann Cai and will load with Asa 20m and brilinta 1896monce. Will get repeat CT Head in AM around 4-5AM. Further Antiplatelet based on repeat imaging in AM.  SaMorganager Number [id number]

## 2022-04-23 NOTE — Anesthesia Preprocedure Evaluation (Signed)
Anesthesia Evaluation  Patient identified by MRN, date of birth, ID band Patient awake    Reviewed: Allergy & Precautions, NPO status , Patient's Chart, lab work & pertinent test resultsPreop documentation limited or incomplete due to emergent nature of procedure.  History of Anesthesia Complications Negative for: history of anesthetic complications  Airway Mallampati: III  TM Distance: >3 FB Neck ROM: Full    Dental  (+) Chipped, Missing,    Pulmonary asthma , COPD, Current Smoker   breath sounds clear to auscultation       Cardiovascular hypertension, Pt. on medications + pacemaker  Rhythm:Irregular     Neuro/Psych  Headaches Prior stroke presenting with acute cva TIACVA, Residual Symptoms    GI/Hepatic ,GERD  ,,  Endo/Other    Renal/GU Renal diseaseLab Results      Component                Value               Date                      CREATININE               0.90                04/23/2022                Musculoskeletal  (+) Arthritis ,    Abdominal   Peds  Hematology  (+) Blood dyscrasia, anemia Lab Results      Component                Value               Date                      WBC                      5.0                 04/23/2022                HGB                      12.9 (L)            04/23/2022                HCT                      38.0 (L)            04/23/2022                MCV                      86.6                04/23/2022                PLT                      211                 04/23/2022              Anesthesia Other Findings   Reproductive/Obstetrics  Anesthesia Physical Anesthesia Plan  ASA: 4 and emergent  Anesthesia Plan: General   Post-op Pain Management: Minimal or no pain anticipated   Induction: Intravenous, Rapid sequence and Cricoid pressure planned  PONV Risk Score and Plan: 1 and Ondansetron and  Dexamethasone  Airway Management Planned: Oral ETT  Additional Equipment: None  Intra-op Plan:   Post-operative Plan: Possible Post-op intubation/ventilation  Informed Consent: I have reviewed the patients History and Physical, chart, labs and discussed the procedure including the risks, benefits and alternatives for the proposed anesthesia with the patient or authorized representative who has indicated his/her understanding and acceptance.     Dental advisory given  Plan Discussed with: CRNA  Anesthesia Plan Comments:        Anesthesia Quick Evaluation

## 2022-04-23 NOTE — H&P (Signed)
Neurology H&P  CC: Left-sided weakness  History is obtained from: Chart review  HPI: [name] is a 72 y.o. male with a history of previous right thalamic stroke who presents with left-sided weakness.  He states that he has had several episodes of this, most recently 2 weeks ago.  At that time he was treated by the internal medicine teaching service as a TIA.  They did appropriate workup with CTA head, carotid Dopplers, echo, telemetry, LDL was 128 despite maximal dose atorvastatin and therefore he was started on ezetimibe.  He was discharged on dual antiplatelet therapy appropriately.  He states that he is taking his aspirin and Plavix, but has missed a couple of doses.  Today, he woke up with worsened left-sided weakness which is been present and due to not improving, he got his wife to drive him to the emergency department where he was activated as a code stroke.  His brother and son-in-law had to manually assist him to the  He was evaluated in CT, and a CTA/CTP were obtained which demonstrated an M1 occlusion, new from 2 weeks ago with significant penumbra.  He denies any other recent illnesses or concerns.  He has indicated in the past with multiple admissions that he did not want to be resuscitated in the event of cardiac arrest.  I discussed with his wife who initially was hesitant to make any decision, but I asked her to clarify what his wishes have been stated in the past and both her and another family ember agree that he has said he would not want that in the setting of cardiac arrest.  He would want full support and treatment up to the point of cardiac arrest.  He does want aggressive interventions to try and preserve independence and after discussion with the patient he would like to proceed with mechanical thrombectomy.  LKW:.  9 PM tpa given?: No, outside of window IR Thrombectomy?  Yes Modified Rankin Scale: 1-No significant post stroke disability and can perform usual  duties with stroke symptoms NIHSS: Six    Past Medical History:  Diagnosis Date   Atrial tachycardia 04/11/2012   CEREBROVASCULAR ACCIDENT 10/17/2008   Acute CVA of right thalamus 10/03/08 ECHO performed 2/2 CVA 10/09/08, EF 55-65%  Aspirin 325 mg daily and try to work on quitting smoking.    Emphysema    per multiple CXRs   Hypertension    goal < 140/90   Sickle cell trait (HCC)    Snoring    Sleep study 09/02/10 was WNL   Stroke [facility]) 10/03/08   Right thalamus   Tuberculosis at age 26-3     Family History  Problem Relation Age of Onset   Hypertension Mother    Cancer Sister        Unknown   Hypertension Sister    Diabetes Sister    Cancer Sister    Sickle cell trait Son    Sickle cell anemia Son      Social History:  reports that he has been smoking cigarettes. He has a 1.00 pack-year smoking history. He has never used smokeless tobacco. He reports that he does not drink alcohol and does not use drugs.   Prior to Admission medications   Medication Sig Start Date End Date Taking? Authorizing Provider  amLODipine-atorvastatin (CADUET) 10-80 MG tablet Take 1 tablet by mouth daily. 04/06/22 04/06/23  Delene Ruffini, MD  aspirin EC 81 MG tablet Take 1 tablet (81 mg total) by mouth daily. Swallow  whole. Patient taking differently: Take 81 mg by mouth daily. 04/06/22 04/06/23  Delene Ruffini, MD  cetirizine (ZYRTEC ALLERGY) 10 MG tablet Take 1 tablet (10 mg total) by mouth daily. 01/15/22 01/15/23  Virl Axe, MD  chlorthalidone (HYGROTON) 25 MG tablet Take 1 tablet (25 mg total) by mouth daily. 12/25/21   [name], DO  clopidogrel (PLAVIX) 75 MG tablet Take 1 tablet (75 mg total) by mouth daily. 04/06/22   Delene Ruffini, MD  ezetimibe (ZETIA) 10 MG tablet Take 1 tablet (10 mg total) by mouth daily. 04/09/22   Angelique Blonder, DO  sildenafil (VIAGRA) 100 MG tablet Take 0.5 tablets (50 mg total) by mouth as needed for erectile dysfunction. Patient not taking: Reported on  04/08/2022 10/16/21 10/16/22  [name], DO  varenicline (CHANTIX CONTINUING MONTH PAK) 1 MG tablet Take 1 tablet (1 mg total) by mouth 2 (two) times daily. Patient not taking: Reported on 01/05/2022 11/13/21   [name], DO     Exam: Current vital signs: BP (!) 146/95   Pulse 73   Temp 97.7 F (36.5 C) (Oral)   Resp 13   SpO2 100%    Physical Exam  Constitutional: Appears well-developed and well-nourished.  Psych: Affect appropriate to situation Eyes: No scleral injection HENT: No OP obstrucion Head: Normocephalic.  Cardiovascular: Normal rate and regular rhythm.  Respiratory: Effort normal and breath sounds normal to anterior ascultation GI: Soft.  No distension. There is no tenderness.  Skin: WDI  Neuro: Mental Status: Patient is awake, alert, oriented to person, place, month, year, and situation. Patient is able to give a clear and coherent history. No signs of aphasia or neglect Cranial Nerves: II: Visual Fields are full. Pupils are equal, round, and reactive to light.   III,IV, VI: EOMI without ptosis or diploplia.  V: Facial sensation diminished on the left VII: Facial movement with left-sided facial weakness VIII: hearing is intact to voice Motor: He has 5/5 strength throughout the right side, 3/5 strength in the left upper extremity, 4/5 strength in the left lower extremity Sensory: Sensation is diminished on the left side Cerebellar: Does not perform  I have reviewed labs in epic and the pertinent results are: 0.93  I have reviewed the images obtained: CTA-no M1 occlusion, CTP-large penumbra  Primary Diagnosis:  Cerebral infarction due to occlusion or stenosis of right middle cerebral artery.   Secondary Diagnosis: Essential (primary) hypertension   Impression: 72 year old male with a history of intracranial atherosclerosis, hypertension who presents with right M1 occlusion in the setting of previous stenosis.  My suspicion is this represents  large vessel atherosclerotic plaque rupture as opposed to embolization, but this is still unclear.  He is going for intervention and will be admitted to the ICU for close monitoring.  Plan: - HgbA1c, fasting lipid panel - MRI of the brain without contrast - Frequent neuro checks - Echocardiogram - Prophylactic therapy-pending intevention - Risk factor modification - Telemetry monitoring - PT consult, OT consult, Speech consult - Stroke team to follow   This patient is critically ill and at significant risk of neurological worsening, death and care requires constant monitoring of vital signs, hemodynamics,respiratory and cardiac monitoring, neurological assessment, discussion with family, other specialists and medical decision making of high complexity. I spent 55 minutes of neurocritical care time  in the care of  this patient. This was time spent independent of any time provided by nurse practitioner or PA.  Roland Rack, MD [facility] [phone]  If 7pm- 7am, please page neurology on call as listed in [place].

## 2022-04-23 NOTE — Sedation Documentation (Signed)
Cangelor infusion changed to 62mg/kg/min high dose infusion per MD.

## 2022-04-23 NOTE — Anesthesia Procedure Notes (Signed)
Procedure Name: Intubation Date/Time: 04/23/2022 3:23 PM  Performed by: Georgia Duff, CRNAPre-anesthesia Checklist: Patient identified, Emergency Drugs available, Suction available and Patient being monitored Patient Re-evaluated:Patient Re-evaluated prior to induction Oxygen Delivery Method: Circle System Utilized Preoxygenation: Pre-oxygenation with 100% oxygen Induction Type: IV induction Ventilation: Mask ventilation without difficulty Laryngoscope Size: Mac and 4 Grade View: Grade I Tube type: Oral Number of attempts: 1 Airway Equipment and Method: Stylet and Oral airway Placement Confirmation: ETT inserted through vocal cords under direct vision, positive ETCO2 and breath sounds checked- equal and bilateral Secured at: 23 cm Tube secured with: Tape Dental Injury: Teeth and Oropharynx as per pre-operative assessment

## 2022-04-23 NOTE — ED Triage Notes (Signed)
Pt came in via POV from home d/t stroke s/s that he noticed when fell trying to get out of bed around 0900. Pt states this feels the same as when he had a stroke in the past. Does have HA, Lt arm weakness & Lt sided facial droop. A/Ox4, denies pain, stated he was able to get out of the floor & then call his wife. LKN was when he went to sleep last night.

## 2022-04-23 NOTE — Sedation Documentation (Signed)
12 cc of air injected into TR Band for hemostatsis

## 2022-04-23 NOTE — Procedures (Signed)
INTERVENTIONAL NEURORADIOLOGY BRIEF POSTPROCEDURE NOTE  DIAGNOSTIC CEREBRAL ANGIOGRAM MECHANICAL THROMBECTOMY INTRACRANIAL ANGIOPLASTY AND STENTING  Attending: Dr. Pedro Earls  Diagnosis: Right MCA occlusion/M1  Access site: RCFA; Right radial artery  Access closure: Perclose prostyle; TRband  Anesthesia: GETA  Medication used: Cangrelor IV bolus and drip.  Complications: Trace SAH vs contrast staining right Sylvian fissure  Estimated blood loss: 100 mL  Specimen: None  Findings: Occlusion of proximal right M1/MCA.  Attempted femoral access proved unsuccessful due to arch anatomy. Mechanical thrombectomy performed via right radial access with stent retriever. Complete recanalization after 1 pass (TICI 3). Restenosis of the M1 seen on delay angio. Angioplasty performed with temporary improvement followed by restenosis. Patient was then loaded on cangrelor and a 2 x 15 mm frontier onyx stent was deployed in the M1 segment with resolution of stenosis.  Post procedure flat panel CT showed trace SAH vs contrast staining right Sylvian fissure.  PLAN: - Continue cangrelor drip - Obtain head CT at 20:00. If no progression of SAH, load 180 mg brilinta and 81 mg ASA. If progression of bleed, please page me at [phone].

## 2022-04-23 NOTE — ED Provider Triage Note (Signed)
Emergency Medicine Provider Triage Evaluation Note  [name] , a 72 y.o. male  was evaluated in triage.  Pt complains of falling when he got out of bed this morning.  States that he was unable to get off the floor and wife had to help him.  He is unsure when he went to bed last night but states that it was late.  Is feeling weak.  Review of Systems  Positive: See above Negative:   Physical Exam  BP (!) 125/98 (BP Location: Right Arm)   Pulse 73   Temp 97.7 F (36.5 C) (Oral)   Resp 16   SpO2 100%  Gen:   Awake Resp:  Normal effort MSK:    Other:  Patient with left-sided facial droop, drooling out of the left side of his mouth, pronator drift and significant weakness to the left arm  Medical Decision Making  Medically screening exam initiated at 2:19 PM.  Appropriate orders placed.  [name] was informed that the remainder of the evaluation will be completed by another provider, this initial triage assessment does not replace that evaluation, and the importance of remaining in the ED until their evaluation is complete.  Will call LVO positive code stroke. Charge RN, Dispensing optician made aware pt needs to be bedded immediately.    Mickie Hillier, PA-C 04/23/22 1424

## 2022-04-23 NOTE — Code Documentation (Signed)
[name] is a 72 yr old male arriving to [facility] via [place] on 04/23/2022 with a past medical history of COPD, HTN HLD and prior stroke. He is not on any blood thinners. Pt is from home where he was last known well last night at bedtime and is now complaining of Left weakness, left droop and dysarthria. Code stroke activated in triage. Airway, Labs and glucose obtained.    Pt met by stroke team in CT. NIHSS 6 (please see documentation for times and NIHSS details). Pt is hemiplegic on left, with left droop, and dysarthria. The following imaging was obtained: CT, CTA. CT neg for acute hemorrhage per Dr Leonel Ramsay. CTA positive for Rt MCA M1 occlusion, with 0 core and 109 cc penumbra. Code IR activated at 1508.     Pt taken to IR suite at 1510. Covid swab sent.  Pt's wife found and updated by Dr .Leonel Ramsay. Pt consented by Dr. Karenann Cai, witnessed by [place] SRN.  Handoff to IR RN complete.

## 2022-04-23 NOTE — Transfer of Care (Signed)
Immediate Anesthesia Transfer of Care Note  Patient: [name]  Procedure(s) Performed: IR WITH ANESTHESIA  Patient Location: PACU drowsy and patient cooperative Anesthesia Type:General  Level of Consciousness: drowsy  Airway & Oxygen Therapy: Patient Spontanous Breathing  Post-op Assessment: Report given to RN and Post -op Vital signs reviewed and stable  Post vital signs: Reviewed and stable  Last Vitals:  Vitals Value Taken Time  BP 117/78 04/23/22 1831  Temp    Pulse 76 04/23/22 1836  Resp 13 04/23/22 1836  SpO2 100 % 04/23/22 1836  Vitals shown include unvalidated device data.  Last Pain:  Vitals:   04/23/22 1422  TempSrc:   PainSc: 0-No pain         Complications: No notable events documented.

## 2022-04-23 NOTE — ED Provider Notes (Signed)
[place] Provider Note   CSN: [id number] Arrival date & time: 04/23/22  1413     History  Chief Complaint  Patient presents with   Stroke Symptoms     [name] is a 72 y.o. male.  This is a 72 year old male with history of hypertension, prior CVA, COPD, SA node dysfunction status post pacemaker presenting to the ED for strokelike symptoms. Patient was recently admitted and discharged on 04/09/2022 after being worked up for TIA with left-sided weakness.  CT did not show acute stroke and patient was medically managed.  He returns today due to worsening left-sided weakness and facial droop.  He reportedly went to bed well last night at an unknown time, woke up this morning unable to get out of bed.  He rolled off the bed and was able to call for help.  He states he missed his Plavix and aspirin over the last 2 days, denies any infectious symptoms such as fever, cough, congestion.  He denies any chest pain or shortness of breath.    Home Medications Prior to Admission medications   Medication Sig Start Date End Date Taking? Authorizing Provider  amLODipine-atorvastatin (CADUET) 10-80 MG tablet Take 1 tablet by mouth daily. 04/06/22 04/06/23  Delene Ruffini, MD  aspirin EC 81 MG tablet Take 1 tablet (81 mg total) by mouth daily. Swallow whole. Patient taking differently: Take 81 mg by mouth daily. 04/06/22 04/06/23  Delene Ruffini, MD  cetirizine (ZYRTEC ALLERGY) 10 MG tablet Take 1 tablet (10 mg total) by mouth daily. 01/15/22 01/15/23  Virl Axe, MD  chlorthalidone (HYGROTON) 25 MG tablet Take 1 tablet (25 mg total) by mouth daily. 12/25/21   [name], DO  clopidogrel (PLAVIX) 75 MG tablet Take 1 tablet (75 mg total) by mouth daily. 04/06/22   Delene Ruffini, MD  ezetimibe (ZETIA) 10 MG tablet Take 1 tablet (10 mg total) by mouth daily. 04/09/22   Angelique Blonder, DO  sildenafil (VIAGRA) 100 MG tablet Take 0.5 tablets (50  mg total) by mouth as needed for erectile dysfunction. Patient not taking: Reported on 04/08/2022 10/16/21 10/16/22  [name], DO  varenicline (CHANTIX CONTINUING MONTH PAK) 1 MG tablet Take 1 tablet (1 mg total) by mouth 2 (two) times daily. Patient not taking: Reported on 01/05/2022 11/13/21   [name], DO      Allergies    Ace inhibitors    Review of Systems   Review of Systems  Constitutional:  Negative for chills, fatigue and fever.  HENT:  Negative for congestion.   Respiratory:  Negative for cough and shortness of breath.   Cardiovascular:  Negative for chest pain.  Gastrointestinal:  Negative for abdominal pain.  Neurological:  Positive for facial asymmetry and weakness. Negative for numbness.  All other systems reviewed and are negative.   Physical Exam Updated Vital Signs BP (!) 125/98 (BP Location: Right Arm)   Pulse 73   Temp 97.7 F (36.5 C) (Oral)   Resp 16   SpO2 100%  Physical Exam Constitutional:      Appearance: He is ill-appearing.  HENT:     Head: Normocephalic and atraumatic.     Nose: Nose normal.     Mouth/Throat:     Mouth: Mucous membranes are moist.  Eyes:     Pupils: Pupils are equal, round, and reactive to light.  Cardiovascular:     Rate and Rhythm: Normal rate and regular rhythm.  Pulses: Normal pulses.     Heart sounds: No murmur heard.    No friction rub. No gallop.  Pulmonary:     Effort: Pulmonary effort is normal. No respiratory distress.     Breath sounds: No wheezing or rales.  Abdominal:     General: Abdomen is flat.  Skin:    General: Skin is warm.     Capillary Refill: Capillary refill takes less than 2 seconds.  Neurological:     Mental Status: He is alert and oriented to person, place, and time.     Sensory: No sensory deficit.     Comments: Left arm weakness with increased tone, pronator drift in left arm, he also had left-sided facial weakness with forehead sparing, possible left sided hemianopsia  4.5 out  of 5 strength in the left lower extremity, 5 out of 5 strength in the right lower extremity     ED Results / Procedures / Treatments   Labs (all labs ordered are listed, but only abnormal results are displayed) Labs Reviewed  CBC - Abnormal; Notable for the following components:      Result Value   HCT 37.4 (*)    All other components within normal limits  I-STAT CHEM 8, ED - Abnormal; Notable for the following components:   Potassium 3.4 (*)    BUN 7 (*)    Calcium, Ion 1.14 (*)    Hemoglobin 12.9 (*)    HCT 38.0 (*)    All other components within normal limits  PROTIME-INR  APTT  DIFFERENTIAL  ETHANOL  COMPREHENSIVE METABOLIC PANEL  RAPID URINE DRUG SCREEN, HOSP PERFORMED  URINALYSIS, ROUTINE W REFLEX MICROSCOPIC  CBG MONITORING, ED    EKG None  Radiology CT HEAD CODE STROKE WO CONTRAST  Result Date: 04/23/2022 CLINICAL DATA:  Code stroke.  Neuro deficit, acute, stroke suspected EXAM: CT HEAD WITHOUT CONTRAST TECHNIQUE: Contiguous axial images were obtained from the base of the skull through the vertex without intravenous contrast. RADIATION DOSE REDUCTION: This exam was performed according to the departmental dose-optimization program which includes automated exposure control, adjustment of the mA and/or kV according to patient size and/or use of iterative reconstruction technique. COMPARISON:  Prior CT head FINDINGS: Brain: Remote right basal ganglia perforator infarct. No evidence of acute large vascular territory infarct, acute hemorrhage, mass lesion or midline shift. Vascular: No hyperdense vessel identified. Calcific atherosclerosis. Skull: No acute fracture. Sinuses/Orbits: No acute orbital findings. No acute orbital findings. Other: No mastoid effusions. ASPECTS [facility] Stroke Program Early CT Score) total score (0-10 with 10 being normal): 10. IMPRESSION: No evidence of acute intracranial abnormality. Code stroke imaging results were communicated on 04/23/2022 at 2:41  pm to provider Dr. Leonel Ramsay via secure text paging. Electronically Signed   By: Margaretha Sheffield M.D.   On: 04/23/2022 14:41    Procedures Procedures    Medications Ordered in ED Medications - No data to display  ED Course/ Medical Decision Making/ A&P                             Medical Decision Making Patient presents with left upper and lower extremity weakness as well as facial droop symptoms concerning for acute ischemic stroke, urgently taken to CT scan and code stroke was called.  Neurology saw patient at bedside and evaluated patient.  Labs including CBC, CMP, coags, and ethanol sent.  NIHSS score 6  I personally reviewed and interpreted patient's labs which shows  potassium 3.4, BMP otherwise normal, CBC unremarkable, coags normal, ethanol negative.  I personally reviewed and interpreted patient's CT angio of head and neck with perfusion and agree with radiologist, there is a right M1 occlusion with large penumbra.  After CT scan patient reevaluated and continues to have left upper greater than lower extremity weakness as well as left facial droop. I further discussed this case with neurology, plan to take patient to IR for thrombectomy.    Patient taken emergently to IR.  Amount and/or Complexity of Data Reviewed External Data Reviewed: notes.    Details: Chart review patient discharged on 04/09/22 after admission for workup for TIA, received echo during this admission which was largely unremarkable. Labs: ordered. Decision-making details documented in ED Course. Radiology: ordered and independent interpretation performed. Decision-making details documented in ED Course.  Risk Decision regarding hospitalization.          Final Clinical Impression(s) / ED Diagnoses Final diagnoses:  None    Rx / DC Orders ED Discharge Orders     None         Jimmie Molly, MD 04/23/22 Si Raider    Varney Biles, MD 04/28/22 [phone]

## 2022-04-24 ENCOUNTER — Encounter (HOSPITAL_COMMUNITY): Payer: Self-pay | Admitting: Radiology

## 2022-04-24 ENCOUNTER — Inpatient Hospital Stay (HOSPITAL_COMMUNITY): Payer: 59

## 2022-04-24 ENCOUNTER — Other Ambulatory Visit (HOSPITAL_COMMUNITY): Payer: Self-pay

## 2022-04-24 DIAGNOSIS — I63311 Cerebral infarction due to thrombosis of right middle cerebral artery: Secondary | ICD-10-CM

## 2022-04-24 LAB — LIPID PANEL
Cholesterol: 126 mg/dL (ref 0–200)
HDL: 33 mg/dL — ABNORMAL LOW (ref >40)
LDL Cholesterol: 82 mg/dL (ref 0–99)
Total CHOL/HDL Ratio: 3.8 RATIO
Triglycerides: 56 mg/dL (ref <150)
VLDL: 11 mg/dL (ref 0–40)

## 2022-04-24 MED ORDER — ASPIRIN 81 MG PO TBEC
81.0000 mg | DELAYED_RELEASE_TABLET | Freq: Every day | ORAL | Status: DC
  Administered 2022-04-24 – 2022-04-25 (×2): 81 mg via ORAL
  Filled 2022-04-24 (×2): qty 1

## 2022-04-24 MED ORDER — EZETIMIBE 10 MG PO TABS
10.0000 mg | ORAL_TABLET | Freq: Every day | ORAL | Status: DC
  Administered 2022-04-24 – 2022-04-25 (×2): 10 mg via ORAL
  Filled 2022-04-24 (×2): qty 1

## 2022-04-24 MED ORDER — TICAGRELOR 90 MG PO TABS
90.0000 mg | ORAL_TABLET | Freq: Two times a day (BID) | ORAL | Status: DC
  Administered 2022-04-24 – 2022-04-25 (×3): 90 mg via ORAL
  Filled 2022-04-24 (×3): qty 1

## 2022-04-24 MED ORDER — ATORVASTATIN CALCIUM 80 MG PO TABS
80.0000 mg | ORAL_TABLET | Freq: Every day | ORAL | Status: DC
  Administered 2022-04-24 – 2022-04-25 (×2): 80 mg via ORAL
  Filled 2022-04-24 (×2): qty 1

## 2022-04-24 NOTE — Progress Notes (Signed)
Stroke education given to pt and visitor. Specifically 911 activation and BEFAST discussed. Individual risk factors discussed.  [place] RN Stroke Response

## 2022-04-24 NOTE — Progress Notes (Signed)
Physical Therapy Evaluation Patient Details Name: JOFFRE CABREROS MRN: [id number] DOB: 07/03/1950 Today's Date: 04/24/2022  History of Present Illness  72 y.o. male with a history of previous right thalamic stroke who presents with left-sided weakness.  He reports experiencing a TIA 2 weeks ago. He woke up with worsened left-sided weakness which is been present and not improving. CT indicated decreasing subarachnoid hemorrhage at the right sylvian fissure.  Clinical Impression  Patient presents with mild decreased balance and tightness in groins from revascularization.  Previously independent at home, but reviewed importance of med compliance to reduce stroke recurrence.  Patient stable for home with will not need follow up PT, will have mobility check on his this weekend.         Recommendations for follow up therapy are one component of a multi-disciplinary discharge planning process, led by the attending physician.  Recommendations may be updated based on patient status, additional functional criteria and insurance authorization.  Follow Up Recommendations No PT follow up      Assistance Recommended at Discharge PRN  Patient can return home with the following  Assist for transportation;Help with stairs or ramp for entrance    Equipment Recommendations None recommended by PT  Recommendations for Other Services       Functional Status Assessment Patient has had a recent decline in their functional status and demonstrates the ability to make significant improvements in function in a reasonable and predictable amount of time.     Precautions / Restrictions Precautions Precautions: Fall Restrictions Weight Bearing Restrictions: No      Mobility  Bed Mobility Overal bed mobility: Modified Independent                  Transfers Overall transfer level: Needs assistance Equipment used: None Transfers: Sit to/from Stand Sit to Stand: Supervision           General  transfer comment: assist for lines    Ambulation/Gait Ambulation/Gait assistance: Supervision Gait Distance (Feet): 400 Feet Assistive device: None Gait Pattern/deviations: WFL(Within Functional Limits), Step-through pattern, Trunk flexed       General Gait Details: mild imbalance and trunk flexed with soreness in groinds  Stairs            Wheelchair Mobility    Modified Rankin (Stroke Patients Only) Modified Rankin (Stroke Patients Only) Pre-Morbid Rankin Score: No symptoms Modified Rankin: No significant disability     Balance Overall balance assessment: Needs assistance   Sitting balance-Leahy Scale: Good     Standing balance support: No upper extremity supported Standing balance-Leahy Scale: Fair                               Pertinent Vitals/Pain Pain Assessment Pain Assessment: No/denies pain Faces Pain Scale: Hurts little more Pain Location: right groin during mobility and attempting to complete LB dressing. 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 [place]       Hand Dominance   Dominant Hand: Right    Extremity/Trunk Assessment   Upper Extremity Assessment Upper Extremity Assessment: Defer to OT evaluation RUE Deficits / Details: MMT: 5/5 shoulder, elbow in all ranges. Functional intact gross grasp. RUE Sensation: WNL RUE Coordination: WNL LUE Deficits / Details:  MMT: 4+/5 shoulder flexion, 5/5 abduction, IR/er, elbow flexion/extension. Slightly decreased left hand gross grasp although non-dominant LUE Sensation: WNL LUE Coordination:  (WFL. Pt demonstrates just slightly decreased motor coordination on the left side. Pt reports that this is his baseline.)    Lower Extremity Assessment Lower Extremity Assessment: Defer to PT evaluation RLE Deficits / Details: Strength 5/5 RLE Sensation: WNL LLE Deficits / Details: Strength 5/5    Cervical / Trunk Assessment Cervical / Trunk Assessment: Normal  Communication   Communication: No difficulties  Cognition Arousal/Alertness: Awake/alert Behavior During Therapy: WFL for tasks assessed/performed Overall Cognitive Status: Within Functional Limits for tasks assessed                                          General Comments General comments (skin integrity, edema, etc.): VSS on RA    Exercises     Assessment/Plan    PT Assessment Patient does not need any further PT services  PT Problem List         PT Treatment Interventions      PT Goals (Current goals can be found in the Care Plan section)  Acute Rehab PT Goals PT Goal Formulation: All assessment and education complete, DC therapy    Frequency       Co-evaluation               AM-PAC PT "6 Clicks" Mobility  Outcome Measure Help needed turning from your back to your side while in a flat bed without using bedrails?: None Help needed moving from lying on your back to sitting on the side of a flat bed without using bedrails?: None Help needed moving to and from a bed to a chair (including a wheelchair)?: None Help needed standing up from a chair using your arms (e.g., wheelchair or bedside chair)?: None Help needed to walk in hospital room?: None Help needed climbing 3-5 steps with a railing? : A Little 6 Click Score: 23    End of Session Equipment Utilized During Treatment: Gait belt Activity Tolerance:  Patient tolerated treatment well Patient left: in bed;with bed alarm set   PT Visit Diagnosis: Other symptoms and signs involving the nervous system (R29.898)    [id number] PT Time Calculation (min) (ACUTE ONLY): 20 min   Charges:   PT Evaluation $PT Eval Low Complexity: 1 Low          Magda Kiel, PT Acute Rehabilitation Services Office:[phone] 04/24/2022   Reginia Naas 04/24/2022, 2:33 PM

## 2022-04-24 NOTE — Progress Notes (Signed)
Pt SBP slightly lower than goal SBP of 120-140. Discussed with Dr Leonie Man. RN instructed if neuro exam remains unchanged ok for SBP to be slightly out of range.

## 2022-04-24 NOTE — Evaluation (Signed)
Occupational Therapy Evaluation Patient Details Name: [name] MRN: [id number] DOB: 1951-02-14 Today's Date: 04/24/2022   History of Present Illness 72 y.o. male with a history of previous right thalamic stroke who presents with left-sided weakness.  He reports experiencing a TIA 2 weeks ago. He woke up with worsened left-sided weakness which is been present and not improving. CT indicated decreasing subarachnoid hemorrhage at the right sylvian fissure.   Clinical Impression   Patient evaluated by Occupational Therapy with no further acute OT needs identified. All education has been completed and the patient has no further questions. Pt reports that he is at his baseline or very close to it with some mild residual left side motor coordination deficit. Pt does not have any concerns upon returning home.  See below for any follow-up Occupational Therapy or equipment needs. OT is signing off. Thank you for this referral.       Recommendations for follow up therapy are one component of a multi-disciplinary discharge planning process, led by the attending physician.  Recommendations may be updated based on patient status, additional functional criteria and insurance authorization.   Follow Up Recommendations  No OT follow up     Assistance Recommended at Discharge None  Patient can return home with the following Assist for transportation    Functional Status Assessment  Patient has not had a recent decline in their functional status  Equipment Recommendations  None recommended by OT       Precautions / Restrictions Precautions Precautions: Fall Restrictions Weight Bearing Restrictions: No      Mobility Bed Mobility Overal bed mobility: Modified Independent     Patient Response: Cooperative  Transfers Overall transfer level: Needs assistance Equipment used: None Transfers: Sit to/from Stand, Bed to chair/wheelchair/BSC Sit to Stand: Min guard (for safety and lines)      Step pivot transfers: Min guard (for safety and lines)            Balance Overall balance assessment: Mild deficits observed, not formally tested       ADL either performed or assessed with clinical judgement   ADL Overall ADL's : Modified independent           Vision Baseline Vision/History: 1 Wears glasses (reading glasses) Ability to See in Adequate Light: 0 Adequate Patient Visual Report: No change from baseline Vision Assessment?: No apparent visual deficits            Pertinent Vitals/Pain Pain Assessment Pain Assessment: Faces Pain Location: right groin during mobility and attempting to complete LB dressing. Pain Descriptors / Indicators: Grimacing, Sore Pain Intervention(s): Monitored during session     Hand Dominance Right   Extremity/Trunk Assessment Upper Extremity Assessment Upper Extremity Assessment: LUE deficits/detail RUE Deficits / Details: MMT: 5/5 shoulder, elbow in all ranges. Functional intact gross grasp. RUE Sensation: WNL RUE Coordination: WNL LUE Deficits / Details: MMT: 4+/5 shoulder flexion, 5/5 abduction, IR/er, elbow flexion/extension. Slightly decreased left hand gross grasp although non-dominant LUE Sensation: WNL LUE Coordination:  (WFL. Pt demonstrates just slightly decreased motor coordination on the left side. Pt reports that this is his baseline.)   Lower Extremity Assessment Lower Extremity Assessment: Defer to PT evaluation   Cervical / Trunk Assessment Cervical / Trunk Assessment: Normal   Communication Communication Communication: No difficulties   Cognition Arousal/Alertness: Awake/alert Behavior During Therapy: Flat affect Overall Cognitive Status: Within Functional Limits for tasks assessed  Home Living Family/patient expects to be discharged to:: Private residence Living Arrangements: Spouse/significant other Available Help at Discharge: Family;Available 24 hours/day (grandson is able  to assist) Type of Home: Apartment Home Access: Level entry     Home Layout: One level     Bathroom Shower/Tub: Teacher, early years/pre: Standard     Home Equipment: None          Prior Functioning/Environment Prior Level of Function : Independent/Modified Independent             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 Lives across from [place] and enjoys walking there          OT Problem List: Decreased strength      OT Treatment/Interventions:   Eval only   OT Goals(Current goals can be found in the care plan section) Acute Rehab OT Goals Patient Stated Goal: none stated  OT Frequency:  1 time  visit       AM-PAC OT "6 Clicks" Daily Activity     Outcome Measure Help from another person eating meals?: None Help from another person taking care of personal grooming?: None Help from another person toileting, which includes using toliet, bedpan, or urinal?: None Help from another person bathing (including washing, rinsing, drying)?: None Help from another person to put on and taking off regular upper body clothing?: None Help from another person to put on and taking off regular lower body clothing?: None 6 Click Score: 24   End of Session    Activity Tolerance: Patient tolerated treatment well Patient left: in chair;with call bell/phone within reach;with chair alarm set  OT Visit Diagnosis: Muscle weakness (generalized) (M62.81)                Time: [id number] OT Time Calculation (min): 37 min Charges:  OT General Charges $OT Visit: 1 Visit OT Evaluation $OT Eval Moderate Complexity: 1 Mod  [employer], OTR/L,CBIS  Supplemental OT - [facility] and [employer] Secure Chat Preferred    [name], [name] 04/24/2022, 2:05 PM

## 2022-04-24 NOTE — TOC Benefit Eligibility Note (Signed)
Patient Teacher, English as a foreign language completed.    The patient is currently admitted and upon discharge could be taking Brilinta 90 mg.  The current 30 day co-pay is $0.00.   The patient is insured through [place] [place] Patient Advocate Specialist [facility] Patient Advocate Team Direct Number: [phone]  Fax: [phone]

## 2022-04-24 NOTE — Progress Notes (Signed)
Referring Physician(s): CODE STROKE  Supervising Physician: Pedro Earls  Patient Status:  [facility] - In-pt  Chief Complaint: Right MCA/M1 Occlusion s/p diagnostic cerebral angiogram, mechanical thrombectomy, intracranial angioplasty and stenting  Subjective: Patient alert/awake. OT and several nurses at the bedside. Patient appears in good spirits and is interacting with all the team members in the room. He denies pain/discomfort.   Allergies: Ace inhibitors  Medications: Prior to Admission medications   Medication Sig Start Date End Date Taking? Authorizing Provider  amLODipine-atorvastatin (CADUET) 10-80 MG tablet Take 1 tablet by mouth daily. 04/06/22 04/06/23  Delene Ruffini, MD  aspirin EC 81 MG tablet Take 1 tablet (81 mg total) by mouth daily. Swallow whole. Patient taking differently: Take 81 mg by mouth daily. 04/06/22 04/06/23  Delene Ruffini, MD  cetirizine (ZYRTEC ALLERGY) 10 MG tablet Take 1 tablet (10 mg total) by mouth daily. 01/15/22 01/15/23  Virl Axe, MD  chlorthalidone (HYGROTON) 25 MG tablet Take 1 tablet (25 mg total) by mouth daily. 12/25/21   [name], DO  clopidogrel (PLAVIX) 75 MG tablet Take 1 tablet (75 mg total) by mouth daily. 04/06/22   Delene Ruffini, MD  ezetimibe (ZETIA) 10 MG tablet Take 1 tablet (10 mg total) by mouth daily. 04/09/22   Angelique Blonder, DO  sildenafil (VIAGRA) 100 MG tablet Take 0.5 tablets (50 mg total) by mouth as needed for erectile dysfunction. Patient not taking: Reported on 04/08/2022 10/16/21 10/16/22  [name], DO  varenicline (CHANTIX CONTINUING MONTH PAK) 1 MG tablet Take 1 tablet (1 mg total) by mouth 2 (two) times daily. Patient not taking: Reported on 01/05/2022 11/13/21   [name], DO     Vital Signs: BP 137/86   Pulse 60   Temp 98.6 F (37 C) (Axillary)   Resp 15   Wt 141 lb 12.1 oz (64.3 kg)   SpO2 99%   BMI 18.70 kg/m   Physical Exam Constitutional:      General: He is  not in acute distress.    Appearance: He is not ill-appearing.  Cardiovascular:     Rate and Rhythm: Normal rate and regular rhythm.     Comments: Right groin vascular site is clean, soft, dry and minimally tender to palpation. Right wrist vascular site is clean, soft, dry and non-tender with a strong pulse.  Pulmonary:     Effort: Pulmonary effort is normal.  Musculoskeletal:     Right lower leg: No edema.     Left lower leg: No edema.  Skin:    General: Skin is warm and dry.  Neurological:     Mental Status: He is alert and oriented to person, place, and time.     Cranial Nerves: No cranial nerve deficit, dysarthria or facial asymmetry.     Motor: No weakness.  Psychiatric:        Mood and Affect: Mood normal.        Behavior: Behavior normal.        Thought Content: Thought content normal.        Judgment: Judgment normal.     Imaging: CT HEAD WO CONTRAST (5MM)  Result Date: 04/24/2022 CLINICAL DATA:  Hemorrhagic stroke. Follow-up subarachnoid hemorrhage EXAM: CT HEAD WITHOUT CONTRAST TECHNIQUE: Contiguous axial images were obtained from the base of the skull through the vertex without intravenous contrast. RADIATION DOSE REDUCTION: This exam was performed according to the departmental dose-optimization program which includes automated exposure control, adjustment of the mA and/or kV  according to patient size and/or use of iterative reconstruction technique. COMPARISON:  Yesterday FINDINGS: Brain: Subarachnoid hemorrhage at the lower right sylvian fissure has diminished. No evidence of rebleeding. Adjacent infarct with volume loss is stable. No hydrocephalus or shift. Vascular: Right MCA stent. Skull: No acute or aggressive finding Sinuses/Orbits: Negative IMPRESSION: Decreasing subarachnoid hemorrhage at the right sylvian fissure. No new abnormality. Electronically Signed   By: Jorje Guild M.D.   On: 04/24/2022 04:37   CT HEAD WO CONTRAST  Addendum Date: 04/23/2022   ADDENDUM  REPORT: 04/23/2022 21:41 ADDENDUM: These results were called by telephone at the time of interpretation on 04/23/2022 at 9:14 pm to provider Sal, MD, who verbally acknowledged these results. Electronically Signed   By: Fidela Salisbury M.D.   On: 04/23/2022 21:41   Result Date: 04/23/2022 CLINICAL DATA:  Stroke EXAM: CT HEAD WITHOUT CONTRAST TECHNIQUE: Contiguous axial images were obtained from the base of the skull through the vertex without intravenous contrast. RADIATION DOSE REDUCTION: This exam was performed according to the departmental dose-optimization program which includes automated exposure control, adjustment of the mA and/or kV according to patient size and/or use of iterative reconstruction technique. COMPARISON:  04/23/2022 FINDINGS: Brain: Since the prior examination, there has developed moderate subarachnoid high attenuation material within the right sylvian fissure and sulci of the frontal and temporal operculum likely representing a combination of extravasated contrast and hemorrhage. No associated abnormal mass effect or midline shift. Interval development of a focal area of hypoattenuation within the right basal ganglia involving the right putamen and adjacent anterior limb of the right internal capsule in keeping with a a developing infarct in this location. Vascular stent noted within the right proximal MCA. No abnormal intra or extra-axial mass lesion. Ventricular size is normal. Cerebellum is unremarkable. Vascular: See above Skull: Normal. Negative for fracture or focal lesion. Sinuses/Orbits: No acute finding. Other: Mastoid air cells and middle ear cavities are clear. IMPRESSION: 1. Interval development of moderate subarachnoid high attenuation material within the right Sylvian fissure and sulci of the right frontal and temporal operculum likely representing a combination of extravasated contrast and hemorrhage. No associated abnormal mass effect or midline shift. 2. Interval development  of a focal area of hypoattenuation within the right basal ganglia involving the right putamen and adjacent anterior limb of the right internal capsule in keeping with a developing infarct in this location. Electronically Signed: By: Fidela Salisbury M.D. On: 04/23/2022 21:02   CT ANGIO HEAD NECK W WO CM W PERF (CODE STROKE)  Result Date: 04/23/2022 CLINICAL DATA:  Provided history: Neuro deficit, acute, stroke suspected. EXAM: CT ANGIOGRAPHY HEAD AND NECK CT PERFUSION BRAIN TECHNIQUE: Multidetector CT imaging of the head and neck was performed using the standard protocol during bolus administration of intravenous contrast. Multiplanar CT image reconstructions and MIPs were obtained to evaluate the vascular anatomy. Carotid stenosis measurements (when applicable) are obtained utilizing NASCET criteria, using the distal internal carotid diameter as the denominator. Multiphase CT imaging of the brain was performed following IV bolus contrast injection. Subsequent parametric perfusion maps were calculated using RAPID software. RADIATION DOSE REDUCTION: This exam was performed according to the departmental dose-optimization program which includes automated exposure control, adjustment of the mA and/or kV according to patient size and/or use of iterative reconstruction technique. CONTRAST:  133m OMNIPAQUE IOHEXOL 350 MG/ML SOLN COMPARISON:  Head CT 04/23/2022.  CT angiogram head 04/08/2022. FINDINGS: CTA NECK FINDINGS Aortic arch: The left vertebral artery arises directly from the aortic arch. Atherosclerotic plaque  within the visualized aortic arch and proximal major branch vessels of the neck. Streak and beam hardening artifact arising from a dense right-sided contrast bolus partially obscures the right subclavian artery. Within this limitation, there is no appreciable hemodynamically significant innominate or proximal subclavian artery stenosis. Right carotid system: CCA and ICA patent within the neck.  Atherosclerotic plaque. Most notably, atherosclerotic plaque about the carotid bifurcation and within the proximal ICA results in less than 50% stenosis. Left carotid system: CCA and ICA patent within the neck without stenosis. Atherosclerotic plaque, most notably about the carotid bifurcation. Vertebral arteries: Vertebral arteries patent within the neck. The right vertebral artery is dominant. Calcified atherosclerotic plaque at the origin of the left vertebral artery with suspected at least moderate stenosis. Skeleton: Cervical spondylosis. No acute fracture or aggressive osseous lesion. Other neck: No neck mass or cervical lymphadenopathy. Upper chest: Bullous emphysema. Left apical pleuroparenchymal scarring. No airspace consolidation within the imaged lung apices. Review of the MIP images confirms the above findings CTA HEAD FINDINGS Anterior circulation: The intracranial internal carotid arteries are patent. Atherosclerotic plaque bilaterally. Most notably, there is a moderate atherosclerotic stenosis within the supraclinoid left ICA. The M1 right middle cerebral artery is occluded proximally (series 12, image 23). There is faint enhancement more distally within the right M1 segment and within some M2 and more distal right MCA vessels (likely due collateral flow). The M1 left middle cerebral artery is patent. Redemonstrated moderate stenoses within the proximal and mid-to-distal aspects of the left M1 segment (series 12, image 22). No left M2 proximal branch occlusion or high-grade proximal stenosis. The anterior cerebral arteries are patent. Atherosclerotic irregularity of both vessels without high-grade proximal stenosis. No intracranial aneurysm is identified. Posterior circulation: The intracranial vertebral arteries are patent. The basilar artery is patent. Redemonstrated moderate/severe stenosis within the proximal basilar artery. The posterior cerebral arteries are patent. Atherosclerotic irregularity  of both vessels without high-grade proximal stenosis. Hypoplastic left P1 segment with sizable left posterior communicating artery. The right posterior communicating artery is diminutive or absent. Venous sinuses: Within the limitations of contrast timing, no convincing thrombus. Anatomic variants: As described. Review of the MIP images confirms the above findings CT Brain Perfusion Findings: Bolus quality/timing limits reliability of the perfusion data. CBF (<30%) Volume: 25m Perfusion (Tmax>6.0s) volume: 1049m(right middle cerebral artery vascular territory). Mismatch Volume: 10936mnfarction Location:None identified Occlusion of the proximal M1 right middle cerebral artery, new from the prior CTA head of 04/08/2022. This result, and the perfusion results, were called by telephone at the time of interpretation on 04/23/2022 at 3:14 pm to provider MC[facility]ILL [facility]Che[facility] Centerwho verbally acknowledged these results. IMPRESSION: CTA neck: 1. The common carotid and internal carotid arteries are patent within the neck. Atherosclerotic plaque bilaterally resulting in less than 50% stenosis. 2. Vertebral arteries patent within the neck. Calcified atherosclerotic plaque at the origin of the non-dominant left vertebral artery with suspected at least moderate stenosis. 3.  Aortic Atherosclerosis (ICD10-I70.0). CTA head: 1. The M1 right middle cerebral artery is occluded proximally, new from the recent prior CTA head of 04/08/2022. 2. Background intracranial atherosclerotic disease, most notably as follows. Moderate stenosis within the supraclinoid left internal carotid artery. Moderate stenoses within the M1 left middle cerebral artery. Moderate/severe stenosis within the proximal basilar artery. CT perfusion head: Bolus quality/timing limits reliability of the perfusion data. Within limitation, no core infarct is identified. The perfusion software identifies a 109 mL region of critically hypoperfused parenchyma within the right  MCA vascular territory (  utilizing the Tmax>6 seconds threshold). Reported mismatch volume: 109 mL. Electronically Signed   By: Kellie Simmering D.O.   On: 04/23/2022 15:47   CT HEAD CODE STROKE WO CONTRAST  Result Date: 04/23/2022 CLINICAL DATA:  Code stroke.  Neuro deficit, acute, stroke suspected EXAM: CT HEAD WITHOUT CONTRAST TECHNIQUE: Contiguous axial images were obtained from the base of the skull through the vertex without intravenous contrast. RADIATION DOSE REDUCTION: This exam was performed according to the departmental dose-optimization program which includes automated exposure control, adjustment of the mA and/or kV according to patient size and/or use of iterative reconstruction technique. COMPARISON:  Prior CT head FINDINGS: Brain: Remote right basal ganglia perforator infarct. No evidence of acute large vascular territory infarct, acute hemorrhage, mass lesion or midline shift. Vascular: No hyperdense vessel identified. Calcific atherosclerosis. Skull: No acute fracture. Sinuses/Orbits: No acute orbital findings. No acute orbital findings. Other: No mastoid effusions. ASPECTS [facility] Stroke Program Early CT Score) total score (0-10 with 10 being normal): 10. IMPRESSION: No evidence of acute intracranial abnormality. Code stroke imaging results were communicated on 04/23/2022 at 2:41 pm to provider Dr. Leonel Ramsay via secure text paging. Electronically Signed   By: Margaretha Sheffield M.D.   On: 04/23/2022 14:41    Labs:  CBC: Recent Labs    04/07/22 1904 04/08/22 0528 04/09/22 0338 04/23/22 1428 04/23/22 1431  WBC 3.9* 3.7* 4.4 5.0  --   HGB 12.7* 12.4* 12.2* 13.2 12.9*  HCT 36.7* 34.6* 35.4* 37.4* 38.0*  PLT 206 226 213 211  --     COAGS: Recent Labs    04/23/22 1428  INR 1.1  APTT 29    BMP: Recent Labs    04/07/22 1904 04/08/22 0528 04/09/22 0338 04/23/22 1428 04/23/22 1431  NA 137 135 135 140 143  K 3.7 3.6 3.9 3.2* 3.4*  CL 100 100 101 105 104  CO2 24 25 26 26   $ --   GLUCOSE 88 94 99 98 95  BUN 13 9 18 $ 7* 7*  CALCIUM 9.0 8.7* 9.1 9.1  --   CREATININE 0.79 0.84 0.89 0.93 0.90  GFRNONAA >60 >60 >60 >60  --     LIVER FUNCTION TESTS: Recent Labs    10/16/21 1020 12/25/21 1010 04/07/22 1904 04/23/22 1428  BILITOT 0.5 0.5 0.7 0.5  AST 26 33 39 31  ALT 15 36 24 28  ALKPHOS 135* 125* 129* 110  PROT 6.7 6.8 6.9 7.4  ALBUMIN 4.4 4.4 3.7 3.8    Assessment and Plan:  Right MCA/M1 Occlusion s/p diagnostic cerebral angiogram, mechanical thrombectomy, intracranial angioplasty and stenting  Patient alert/oriented and able to move all extremities. No deficits identified on exam. He passed his swallow screen. He is afebrile and without leukocytosis. Patient started on aspirin and Brilinta this morning.   Patient will follow up with [facility] in 6 months for a CTA head.   Electronically Signed: Soyla Dryer, AGACNP-BC [phone] 04/24/2022, 11:28 AM   I spent a total of 15 Minutes at the the patient's bedside AND on the patient's hospital floor or unit, greater than 50% of which was counseling/coordinating care for Right MCA occlusion s/p thrombectomy, angioplasty and stenting.

## 2022-04-24 NOTE — Progress Notes (Signed)
Pt unable to have MRI due to having a pacemaker that is NOT MRI conditional.

## 2022-04-24 NOTE — Progress Notes (Addendum)
STROKE TEAM PROGRESS NOTE   INTERVAL HISTORY Patient is seen in his room with no family at the bedside.  Yesterday, he woke up with worsened left-sided weakness and came to the emergency room.  He was found to have a new right M1 occlusion and went to IR for mechanical thrombectomy. TICI 3 flow was achieved after 1 pass, however, patient was noted to have restenosis and required a stent in the M1 segment.  Postprocedural CT showed trace SAH. MRI is pending.  Blood pressure adequately controlled. Vitals:   04/24/22 1100 04/24/22 1200 04/24/22 1300 04/24/22 1400  BP: 133/85 133/86 (!) 112/91 (!) 137/105  Pulse: 65 69 72 66  Resp: 19 14 (!) 21 17  Temp:      TempSrc:      SpO2: 97% 100% 98% 100%  Weight:       CBC:  Recent Labs  Lab 04/23/22 1428 04/23/22 1431  WBC 5.0  --   NEUTROABS 3.6  --   HGB 13.2 12.9*  HCT 37.4* 38.0*  MCV 86.6  --   PLT 211  --    Basic Metabolic Panel:  Recent Labs  Lab 04/23/22 1428 04/23/22 1431  NA 140 143  K 3.2* 3.4*  CL 105 104  CO2 26  --   GLUCOSE 98 95  BUN 7* 7*  CREATININE 0.93 0.90  CALCIUM 9.1  --    Lipid Panel:  Recent Labs  Lab 04/24/22 0507  CHOL 126  TRIG 56  HDL 33*  CHOLHDL 3.8  VLDL 11  LDLCALC 82   HgbA1c: No results for input(s): "HGBA1C" in the last 168 hours. Urine Drug Screen:  Recent Labs  Lab 04/23/22 2118  LABOPIA NONE DETECTED  COCAINSCRNUR POSITIVE*  LABBENZ NONE DETECTED  AMPHETMU NONE DETECTED  THCU NONE DETECTED  LABBARB NONE DETECTED    Alcohol Level  Recent Labs  Lab 04/23/22 1428  ETH <10    IMAGING past 24 hours CT HEAD WO CONTRAST (5MM)  Result Date: 04/24/2022 CLINICAL DATA:  Hemorrhagic stroke. Follow-up subarachnoid hemorrhage EXAM: CT HEAD WITHOUT CONTRAST TECHNIQUE: Contiguous axial images were obtained from the base of the skull through the vertex without intravenous contrast. RADIATION DOSE REDUCTION: This exam was performed according to the departmental dose-optimization  program which includes automated exposure control, adjustment of the mA and/or kV according to patient size and/or use of iterative reconstruction technique. COMPARISON:  Yesterday FINDINGS: Brain: Subarachnoid hemorrhage at the lower right sylvian fissure has diminished. No evidence of rebleeding. Adjacent infarct with volume loss is stable. No hydrocephalus or shift. Vascular: Right MCA stent. Skull: No acute or aggressive finding Sinuses/Orbits: Negative IMPRESSION: Decreasing subarachnoid hemorrhage at the right sylvian fissure. No new abnormality. Electronically Signed   By: Jorje Guild M.D.   On: 04/24/2022 04:37   CT HEAD WO CONTRAST  Addendum Date: 04/23/2022   ADDENDUM REPORT: 04/23/2022 21:41 ADDENDUM: These results were called by telephone at the time of interpretation on 04/23/2022 at 9:14 pm to provider Sal, MD, who verbally acknowledged these results. Electronically Signed   By: Fidela Salisbury M.D.   On: 04/23/2022 21:41   Result Date: 04/23/2022 CLINICAL DATA:  Stroke EXAM: CT HEAD WITHOUT CONTRAST TECHNIQUE: Contiguous axial images were obtained from the base of the skull through the vertex without intravenous contrast. RADIATION DOSE REDUCTION: This exam was performed according to the departmental dose-optimization program which includes automated exposure control, adjustment of the mA and/or kV according to patient size and/or use of iterative  reconstruction technique. COMPARISON:  04/23/2022 FINDINGS: Brain: Since the prior examination, there has developed moderate subarachnoid high attenuation material within the right sylvian fissure and sulci of the frontal and temporal operculum likely representing a combination of extravasated contrast and hemorrhage. No associated abnormal mass effect or midline shift. Interval development of a focal area of hypoattenuation within the right basal ganglia involving the right putamen and adjacent anterior limb of the right internal capsule in keeping  with a a developing infarct in this location. Vascular stent noted within the right proximal MCA. No abnormal intra or extra-axial mass lesion. Ventricular size is normal. Cerebellum is unremarkable. Vascular: See above Skull: Normal. Negative for fracture or focal lesion. Sinuses/Orbits: No acute finding. Other: Mastoid air cells and middle ear cavities are clear. IMPRESSION: 1. Interval development of moderate subarachnoid high attenuation material within the right Sylvian fissure and sulci of the right frontal and temporal operculum likely representing a combination of extravasated contrast and hemorrhage. No associated abnormal mass effect or midline shift. 2. Interval development of a focal area of hypoattenuation within the right basal ganglia involving the right putamen and adjacent anterior limb of the right internal capsule in keeping with a developing infarct in this location. Electronically Signed: By: Fidela Salisbury M.D. On: 04/23/2022 21:02   CT ANGIO HEAD NECK W WO CM W PERF (CODE STROKE)  Result Date: 04/23/2022 CLINICAL DATA:  Provided history: Neuro deficit, acute, stroke suspected. EXAM: CT ANGIOGRAPHY HEAD AND NECK CT PERFUSION BRAIN TECHNIQUE: Multidetector CT imaging of the head and neck was performed using the standard protocol during bolus administration of intravenous contrast. Multiplanar CT image reconstructions and MIPs were obtained to evaluate the vascular anatomy. Carotid stenosis measurements (when applicable) are obtained utilizing NASCET criteria, using the distal internal carotid diameter as the denominator. Multiphase CT imaging of the brain was performed following IV bolus contrast injection. Subsequent parametric perfusion maps were calculated using RAPID software. RADIATION DOSE REDUCTION: This exam was performed according to the departmental dose-optimization program which includes automated exposure control, adjustment of the mA and/or kV according to patient size and/or  use of iterative reconstruction technique. CONTRAST:  144m OMNIPAQUE IOHEXOL 350 MG/ML SOLN COMPARISON:  Head CT 04/23/2022.  CT angiogram head 04/08/2022. FINDINGS: CTA NECK FINDINGS Aortic arch: The left vertebral artery arises directly from the aortic arch. Atherosclerotic plaque within the visualized aortic arch and proximal major branch vessels of the neck. Streak and beam hardening artifact arising from a dense right-sided contrast bolus partially obscures the right subclavian artery. Within this limitation, there is no appreciable hemodynamically significant innominate or proximal subclavian artery stenosis. Right carotid system: CCA and ICA patent within the neck. Atherosclerotic plaque. Most notably, atherosclerotic plaque about the carotid bifurcation and within the proximal ICA results in less than 50% stenosis. Left carotid system: CCA and ICA patent within the neck without stenosis. Atherosclerotic plaque, most notably about the carotid bifurcation. Vertebral arteries: Vertebral arteries patent within the neck. The right vertebral artery is dominant. Calcified atherosclerotic plaque at the origin of the left vertebral artery with suspected at least moderate stenosis. Skeleton: Cervical spondylosis. No acute fracture or aggressive osseous lesion. Other neck: No neck mass or cervical lymphadenopathy. Upper chest: Bullous emphysema. Left apical pleuroparenchymal scarring. No airspace consolidation within the imaged lung apices. Review of the MIP images confirms the above findings CTA HEAD FINDINGS Anterior circulation: The intracranial internal carotid arteries are patent. Atherosclerotic plaque bilaterally. Most notably, there is a moderate atherosclerotic stenosis within the supraclinoid left  ICA. The M1 right middle cerebral artery is occluded proximally (series 12, image 23). There is faint enhancement more distally within the right M1 segment and within some M2 and more distal right MCA vessels  (likely due collateral flow). The M1 left middle cerebral artery is patent. Redemonstrated moderate stenoses within the proximal and mid-to-distal aspects of the left M1 segment (series 12, image 22). No left M2 proximal branch occlusion or high-grade proximal stenosis. The anterior cerebral arteries are patent. Atherosclerotic irregularity of both vessels without high-grade proximal stenosis. No intracranial aneurysm is identified. Posterior circulation: The intracranial vertebral arteries are patent. The basilar artery is patent. Redemonstrated moderate/severe stenosis within the proximal basilar artery. The posterior cerebral arteries are patent. Atherosclerotic irregularity of both vessels without high-grade proximal stenosis. Hypoplastic left P1 segment with sizable left posterior communicating artery. The right posterior communicating artery is diminutive or absent. Venous sinuses: Within the limitations of contrast timing, no convincing thrombus. Anatomic variants: As described. Review of the MIP images confirms the above findings CT Brain Perfusion Findings: Bolus quality/timing limits reliability of the perfusion data. CBF (<30%) Volume: 66m Perfusion (Tmax>6.0s) volume: 1060m(right middle cerebral artery vascular territory). Mismatch Volume: 10985mnfarction Location:None identified Occlusion of the proximal M1 right middle cerebral artery, new from the prior CTA head of 04/08/2022. This result, and the perfusion results, were called by telephone at the time of interpretation on 04/23/2022 at 3:14 pm to provider MCNEILL KIRGarden State Endoscopy And Surgery Centerwho verbally acknowledged these results. IMPRESSION: CTA neck: 1. The common carotid and internal carotid arteries are patent within the neck. Atherosclerotic plaque bilaterally resulting in less than 50% stenosis. 2. Vertebral arteries patent within the neck. Calcified atherosclerotic plaque at the origin of the non-dominant left vertebral artery with suspected at least  moderate stenosis. 3.  Aortic Atherosclerosis (ICD10-I70.0). CTA head: 1. The M1 right middle cerebral artery is occluded proximally, new from the recent prior CTA head of 04/08/2022. 2. Background intracranial atherosclerotic disease, most notably as follows. Moderate stenosis within the supraclinoid left internal carotid artery. Moderate stenoses within the M1 left middle cerebral artery. Moderate/severe stenosis within the proximal basilar artery. CT perfusion head: Bolus quality/timing limits reliability of the perfusion data. Within limitation, no core infarct is identified. The perfusion software identifies a 109 mL region of critically hypoperfused parenchyma within the right MCA vascular territory (utilizing the Tmax>6 seconds threshold). Reported mismatch volume: 109 mL. Electronically Signed   By: KylKellie SimmeringO.   On: 04/23/2022 15:47    PHYSICAL EXAM General: Alert, well-nourished, well-developed pleasant elderly African-American male patient in no acute distress Respiratory: Regular, unlabored respirations on room air  NEURO:  Mental Status: AA&Ox3  Speech/Language: speech is without dysarthria or aphasia.    Cranial Nerves:  II: PERRL. Visual fields full.  III, IV, VI: EOMI. Eyelids elevate symmetrically.  V: Sensation is intact to light touch and symmetrical to face.  VII: Slight left facial droop VIII: hearing intact to voice. IX, X: Phonation is normal.  XI:RL:[id number] 5/5. XII: tongue is midline without fasciculations. Motor: 5/5 strength to right upper and lower extremities, 5/5 strength to left upper extremity, 4+/5 strength to left lower extremity.  Mild diminished left grip strength.  Finding normal nystagmus on the left.  Orbits right over left upper extremity. Tone: is normal and bulk is normal Sensation- Intact to light touch bilaterally. Coordination: FTN intact bilaterally.No drift.  Diminished fine finger movements on the left Gait-  deferred   ASSESSMENT/PLAN [name] a  72 y.o. male with history of right thalamic stroke presenting with worsened left-sided weakness.  He was found to have a new right M1 occlusion and went to IR for mechanical thrombectomy. TICI 3 flow was achieved after 1 pass, however, patient was noted to have restenosis and required a stent in the M1 segment.  Postprocedural CT showed trace SAH.   Stroke:  right MCA stroke due to right M1 occlusion status post mechanical thrombectomy and rescue right MCA stenting Etiology: Large vessel occlusion and stenosis Code Stroke CT head No acute abnormality. ASPECTS 10.    CTA head & neck proximal right M1 occlusion, moderate stenosis in left M1, moderate/severe stenosis in proximal basilar artery CT perfusion no core infarct, 109 mL penumbra Post IR CT 2/15 interval development of moderate subarachnoid high attenuation material within right sylvian fissure, likely combination of extravasated contrast and hemorrhage, interval development of focal area of hypoattenuation in right basal ganglia Follow-up CT 2/16: Diminished subarachnoid hemorrhage at lower right sylvian fissure MRI pending Carotid Doppler 1 to 39% stenosis in right and left ICA 2D Echo EF 65 to [id number], grade 1 diastolic dysfunction, normal left atrial size, no atrial level shunt LDL 82 HgbA1c 5.3 VTE prophylaxis -SCDs    Diet   Diet regular Room service appropriate? Yes with Assist; Fluid consistency: Thin   aspirin 81 mg daily and clopidogrel 75 mg daily prior to admission, now on aspirin 81 mg daily and Brilinta (ticagrelor) 90 mg bid due to intracranial stent placement Therapy recommendations: Pending Disposition: Pending  Hypertension Home meds: Chlorthalidone 25 mg daily, amlodipine 10 mg daily Stable Keep systolic blood pressure 0000000 for 24 hours then less than 180 Long-term BP goal normotensive  Hyperlipidemia Home meds: Atorvastatin 80 mg daily, resumed in  hospital LDL 82, goal < 70 Add ezetimibe 10 mg daily Continue statin at discharge  Other Stroke Risk Factors Advanced Age >/= 30  Cigarette smoker advised to stop smoking Hx stroke  Other Active Problems none  Hospital day # [place] , MSN, AGACNP-BC [facility] See Amion for schedule and pager information 04/24/2022 3:08 PM   STROKE MD NOTE :  I have personally obtained history,examined this patient, reviewed notes, independently viewed imaging studies, participated in medical decision making and plan of care.ROS completed by me personally and pertinent positives fully documented  I have made any additions or clarifications directly to the above note. Agree with note above.  Patient presented with left hemiplegia and right M1 occlusion and underwent successful mechanical thrombectomy requiring rescue right MCA angioplasty stenting for underlying stenosis.  Follow-up CT scan shows small right sylvian fissure subarachnoid hemorrhage versus contrast staining.  Continue close neurological follow-up and strict blood pressure control systolic goal between 0000000 for the first 24 hours and then below 160.  Mobilize out of bed.  Therapy consults.  Check MRI scan of the brain later today.  Resume home medications when able to swallow.  Continue aspirin and Brilinta for right MCA stent.  Discussed with Dr. Norma Fredrickson neurointerventional radiologist.This patient is critically ill and at significant risk of neurological worsening, death and care requires constant monitoring of vital signs, hemodynamics,respiratory and cardiac monitoring, extensive review of multiple databases, frequent neurological assessment, discussion with family, other specialists and medical decision making of high complexity.I have made any additions or clarifications directly to the above note.This critical care time does not reflect procedure time, or teaching time or supervisory time of PA/NP/Med  Resident etc but could  involve care discussion time.  I spent 30 minutes of neurocritical care time  in the care of  this patient.      Antony Contras, MD Medical Director [facility] Pager: [phone] 04/24/2022 3:29 PM   To contact Stroke Continuity provider, please refer to [email or web address] After hours, contact General Neurology

## 2022-04-25 ENCOUNTER — Inpatient Hospital Stay (HOSPITAL_COMMUNITY): Payer: 59

## 2022-04-25 DIAGNOSIS — I63512 Cerebral infarction due to unspecified occlusion or stenosis of left middle cerebral artery: Secondary | ICD-10-CM

## 2022-04-25 DIAGNOSIS — F17200 Nicotine dependence, unspecified, uncomplicated: Secondary | ICD-10-CM

## 2022-04-25 DIAGNOSIS — F1410 Cocaine abuse, uncomplicated: Secondary | ICD-10-CM

## 2022-04-25 LAB — BASIC METABOLIC PANEL
Anion gap: 7 (ref 5–15)
BUN: <5 mg/dL — ABNORMAL LOW (ref 8–23)
CO2: 24 mmol/L (ref 22–32)
Calcium: 8.3 mg/dL — ABNORMAL LOW (ref 8.9–10.3)
Chloride: 107 mmol/L (ref 98–111)
Creatinine, Ser: 0.83 mg/dL (ref 0.61–1.24)
GFR, Estimated: >60 mL/min (ref >60)
Glucose, Bld: 105 mg/dL — ABNORMAL HIGH (ref 70–99)
Potassium: 3.2 mmol/L — ABNORMAL LOW (ref 3.5–5.1)
Sodium: 138 mmol/L (ref 135–145)

## 2022-04-25 LAB — CBC
HCT: 27.2 % — ABNORMAL LOW (ref 39.0–52.0)
Hemoglobin: 9.9 g/dL — ABNORMAL LOW (ref 13.0–17.0)
MCH: 30.7 pg (ref 26.0–34.0)
MCHC: 36.4 g/dL — ABNORMAL HIGH (ref 30.0–36.0)
MCV: 84.2 fL (ref 80.0–100.0)
Platelets: 183 10*3/uL (ref 150–400)
RBC: 3.23 MIL/uL — ABNORMAL LOW (ref 4.22–5.81)
RDW: 14.8 % (ref 11.5–15.5)
WBC: 6.3 10*3/uL (ref 4.0–10.5)
nRBC: 0 % (ref 0.0–0.2)

## 2022-04-25 LAB — MAGNESIUM: Magnesium: 1.9 mg/dL (ref 1.7–2.4)

## 2022-04-25 LAB — PHOSPHORUS: Phosphorus: 2 mg/dL — ABNORMAL LOW (ref 2.5–4.6)

## 2022-04-25 MED ORDER — ASPIRIN 81 MG PO TBEC: 81.0000 mg | DELAYED_RELEASE_TABLET | Freq: Every day | ORAL | 12 refills | Status: DC

## 2022-04-25 MED ORDER — POTASSIUM CHLORIDE CRYS ER 20 MEQ PO TBCR
40.0000 meq | EXTENDED_RELEASE_TABLET | Freq: Once | ORAL | Status: AC
  Administered 2022-04-25: 40 meq via ORAL
  Filled 2022-04-25: qty 2

## 2022-04-25 MED ORDER — TICAGRELOR 90 MG PO TABS: 90.0000 mg | ORAL_TABLET | Freq: Two times a day (BID) | ORAL | 2 refills | Status: DC

## 2022-04-25 MED ORDER — ONDANSETRON HCL 4 MG/2ML IJ SOLN
4.0000 mg | Freq: Four times a day (QID) | INTRAMUSCULAR | Status: DC | PRN
  Administered 2022-04-25: 4 mg via INTRAVENOUS
  Filled 2022-04-25: qty 2

## 2022-04-25 NOTE — Discharge Instructions (Addendum)
[name], you came to the hospital with sudden onset left sided weakness due to a stroke.  You went to interventional radiology for a mechanical thrombectomy to get rid of the blood clot that was causing the stroke.  A stent was placed in one of the arteries in your brain to keep it open.  You will need to take aspirin and Brilinta every day to make sure the stent stays open.  You will need to follow up in the stroke clinic in 6-8 weeks.

## 2022-04-25 NOTE — Progress Notes (Signed)
After visit summary reviewed with patient and patient's spouse. All questions answered, discussed importance of taking prescribe medications.  Patient alert and oriented, VSS.  Patient discharge home with spouse.

## 2022-04-25 NOTE — TOC Transition Note (Signed)
Transition of Care [facility]) - CM/SW Discharge Note   Patient Details  Name: [name] MRN: [id number] Date of Birth: June 06, 1950  Transition of Care [facility]) CM/SW Contact:  Tom-Johnson, Renea Ee, RN Phone Number: 04/25/2022, 2:48 PM   Clinical Narrative:     Patient is scheduled for discharge today. No PT/OT f/u noted. Outpatient f/u and instructions on AVS. No TOC needs or recommendations noted. Family to transport at discharge. No further TOC needs noted.         Final next level of care: Home/Self Care Barriers to Discharge: Barriers Resolved   Patient Goals and CMS Choice CMS Medicare.gov Compare Post Acute Care list provided to:: Patient Choice offered to / list presented to : NA  Discharge Placement                  Patient to be transferred to facility by: Family      Discharge Plan and Services Additional resources added to the After Visit Summary for                  DME Arranged: N/A DME Agency: NA       HH Arranged: NA HH Agency: NA        Social Determinants of Health (SDOH) Interventions SDOH Screenings   Food Insecurity: No Food Insecurity (04/07/2022)  Recent Concern: [place] Present (01/15/2022)  Housing: Medium Risk (04/07/2022)  Transportation Needs: No Transportation Needs (04/07/2022)  Utilities: Not At Risk (04/07/2022)  Alcohol Screen: Low Risk  (01/15/2022)  Depression (PHQ2-9): Low Risk  (01/15/2022)  Financial Resource Strain: Medium Risk (01/15/2022)  Physical Activity: Insufficiently Active (01/15/2022)  Social Connections: Socially Integrated (01/15/2022)  Stress: Stress Concern Present (01/15/2022)  Tobacco Use: High Risk (04/24/2022)     Readmission Risk Interventions     No data to display

## 2022-04-25 NOTE — Discharge Summary (Cosign Needed Addendum)
Stroke Discharge Summary  Patient ID: [name]   MRN: [id number]      DOB: November 30, 1950  Date of Admission: 04/23/2022 Date of Discharge: 04/25/2022  Attending Physician:  Stroke, Md, MD, Stroke MD Consultant(s):    None  Patient's PCP:  [name], DO  DISCHARGE DIAGNOSIS: right MCA stroke due to right M1 occlusion status post mechanical thrombectomy and rescue right MCA stenting Etiology: Large vessel occlusion and stenosis Principal Problem:   Stroke (cerebrum) ([place])   Allergies as of 04/25/2022       Reactions   Ace Inhibitors Swelling, Other (See Comments)   Patient presented with right upper lip swelling along with tingling and numbness. Possible allergic reaction to ACEI.        Medication List     STOP taking these medications    clopidogrel 75 MG tablet Commonly known as: PLAVIX   sildenafil 100 MG tablet Commonly known as: Viagra   varenicline 1 MG tablet Commonly known as: Chantix Continuing Month Pak       TAKE these medications    amLODipine-atorvastatin 10-80 MG tablet Commonly known as: Caduet Take 1 tablet by mouth daily.   aspirin EC 81 MG tablet Take 1 tablet (81 mg total) by mouth daily. Swallow whole. Start taking on: April 26, 2022 What changed: additional instructions   cetirizine 10 MG tablet Commonly known as: ZyrTEC Allergy Take 1 tablet (10 mg total) by mouth daily.   chlorthalidone 25 MG tablet Commonly known as: HYGROTON Take 1 tablet (25 mg total) by mouth daily.   ezetimibe 10 MG tablet Commonly known as: ZETIA Take 1 tablet (10 mg total) by mouth daily.   ticagrelor 90 MG Tabs tablet Commonly known as: BRILINTA Take 1 tablet (90 mg total) by mouth 2 (two) times daily.        LABORATORY STUDIES CBC    Component Value Date/Time   WBC 6.3 04/25/2022 0255   RBC 3.23 (L) 04/25/2022 0255   HGB 9.9 (L) 04/25/2022 0255   HGB 12.6 (L) 12/25/2021 1010   HCT 27.2 (L) 04/25/2022 0255   HCT 38.8  12/25/2021 1010   PLT 183 04/25/2022 0255   PLT 227 12/25/2021 1010   MCV 84.2 04/25/2022 0255   MCV 91 12/25/2021 1010   MCH 30.7 04/25/2022 0255   MCHC 36.4 (H) 04/25/2022 0255   RDW 14.8 04/25/2022 0255   RDW 16.3 (H) 12/25/2021 1010   LYMPHSABS 1.1 04/23/2022 1428   MONOABS 0.2 04/23/2022 1428   EOSABS 0.0 04/23/2022 1428   BASOSABS 0.0 04/23/2022 1428   CMP    Component Value Date/Time   NA 138 04/25/2022 0255   NA 142 12/25/2021 1010   K 3.2 (L) 04/25/2022 0255   CL 107 04/25/2022 0255   CO2 24 04/25/2022 0255   GLUCOSE 105 (H) 04/25/2022 0255   BUN <5 (L) 04/25/2022 0255   BUN 10 12/25/2021 1010   CREATININE 0.83 04/25/2022 0255   CREATININE 0.90 07/19/2014 1401   CALCIUM 8.3 (L) 04/25/2022 0255   PROT 7.4 04/23/2022 1428   PROT 6.8 12/25/2021 1010   ALBUMIN 3.8 04/23/2022 1428   ALBUMIN 4.4 12/25/2021 1010   AST 31 04/23/2022 1428   ALT 28 04/23/2022 1428   ALKPHOS 110 04/23/2022 1428   BILITOT 0.5 04/23/2022 1428   BILITOT 0.5 12/25/2021 1010   GFRNONAA >60 04/25/2022 0255   GFRNONAA 89 07/19/2014 1401   GFRAA 89 12/27/2019 1453   GFRAA >89  07/19/2014 1401   COAGS Lab Results  Component Value Date   INR 1.1 04/23/2022   INR 1.1 08/10/2018   INR 1.06 04/09/2010   Lipid Panel    Component Value Date/Time   CHOL 126 04/24/2022 0507   CHOL 219 (H) 12/25/2021 1010   TRIG 56 04/24/2022 0507   HDL 33 (L) 04/24/2022 0507   HDL 60 12/25/2021 1010   CHOLHDL 3.8 04/24/2022 0507   VLDL 11 04/24/2022 0507   LDLCALC 82 04/24/2022 0507   LDLCALC 143 (H) 12/25/2021 1010   HgbA1C  Lab Results  Component Value Date   HGBA1C 5.3 04/07/2022   Urinalysis    Component Value Date/Time   COLORURINE YELLOW 04/23/2022 2118   APPEARANCEUR CLEAR 04/23/2022 2118   APPEARANCEUR Clear 10/16/2021 1013   LABSPEC 1.045 (H) 04/23/2022 2118   PHURINE 7.0 04/23/2022 2118   GLUCOSEU NEGATIVE 04/23/2022 2118   GLUCOSEU NEG mg/dL 01/16/2009 2022   HGBUR MODERATE (A)  04/23/2022 2118   HGBUR trace-intact 11/22/2008 0825   BILIRUBINUR NEGATIVE 04/23/2022 2118   BILIRUBINUR Negative 10/16/2021 1013   KETONESUR NEGATIVE 04/23/2022 2118   PROTEINUR NEGATIVE 04/23/2022 2118   UROBILINOGEN 1.0 01/04/2015 1550   UROBILINOGEN 1.0 07/23/2011 2125   NITRITE NEGATIVE 04/23/2022 2118   LEUKOCYTESUR NEGATIVE 04/23/2022 2118   Urine Drug Screen     Component Value Date/Time   LABOPIA NONE DETECTED 04/23/2022 2118   COCAINSCRNUR POSITIVE (A) 04/23/2022 2118   LABBENZ NONE DETECTED 04/23/2022 2118   AMPHETMU NONE DETECTED 04/23/2022 2118   THCU NONE DETECTED 04/23/2022 2118   LABBARB NONE DETECTED 04/23/2022 2118    Alcohol Level    Component Value Date/Time   ETH <10 04/23/2022 1428     SIGNIFICANT DIAGNOSTIC STUDIES CT HEAD WO CONTRAST (5MM)  Result Date: 04/25/2022 CLINICAL DATA:  Stroke follow-up EXAM: CT HEAD WITHOUT CONTRAST TECHNIQUE: Contiguous axial images were obtained from the base of the skull through the vertex without intravenous contrast. RADIATION DOSE REDUCTION: This exam was performed according to the departmental dose-optimization program which includes automated exposure control, adjustment of the mA and/or kV according to patient size and/or use of iterative reconstruction technique. COMPARISON:  Two days ago FINDINGS: Brain: Continued decrease in subarachnoid hemorrhage at the right sylvian fissure, trace currently. Associated low density and volume loss in the subinsular brain and right basal ganglia is unchanged. There is a newly seen small posterior right temporal cortex infarct best seen on sagittal reformats. Vascular: Right MCA stent. Skull: Normal. Negative for fracture or focal lesion. Sinuses/Orbits: No acute finding. IMPRESSION: 1. Continued decrease in subarachnoid hemorrhage at the right sylvian fissure. 2. Small acute infarct in the posterior right temporal cortex. Electronically Signed   By: Jorje Guild M.D.   On: 04/25/2022  06:59   CT HEAD WO CONTRAST (5MM)  Result Date: 04/24/2022 CLINICAL DATA:  Hemorrhagic stroke. Follow-up subarachnoid hemorrhage EXAM: CT HEAD WITHOUT CONTRAST TECHNIQUE: Contiguous axial images were obtained from the base of the skull through the vertex without intravenous contrast. RADIATION DOSE REDUCTION: This exam was performed according to the departmental dose-optimization program which includes automated exposure control, adjustment of the mA and/or kV according to patient size and/or use of iterative reconstruction technique. COMPARISON:  Yesterday FINDINGS: Brain: Subarachnoid hemorrhage at the lower right sylvian fissure has diminished. No evidence of rebleeding. Adjacent infarct with volume loss is stable. No hydrocephalus or shift. Vascular: Right MCA stent. Skull: No acute or aggressive finding Sinuses/Orbits: Negative IMPRESSION: Decreasing subarachnoid hemorrhage at the  right sylvian fissure. No new abnormality. Electronically Signed   By: Jorje Guild M.D.   On: 04/24/2022 04:37   CT HEAD WO CONTRAST  Addendum Date: 04/23/2022   ADDENDUM REPORT: 04/23/2022 21:41 ADDENDUM: These results were called by telephone at the time of interpretation on 04/23/2022 at 9:14 pm to provider Sal, MD, who verbally acknowledged these results. Electronically Signed   By: Fidela Salisbury M.D.   On: 04/23/2022 21:41   Result Date: 04/23/2022 CLINICAL DATA:  Stroke EXAM: CT HEAD WITHOUT CONTRAST TECHNIQUE: Contiguous axial images were obtained from the base of the skull through the vertex without intravenous contrast. RADIATION DOSE REDUCTION: This exam was performed according to the departmental dose-optimization program which includes automated exposure control, adjustment of the mA and/or kV according to patient size and/or use of iterative reconstruction technique. COMPARISON:  04/23/2022 FINDINGS: Brain: Since the prior examination, there has developed moderate subarachnoid high attenuation material  within the right sylvian fissure and sulci of the frontal and temporal operculum likely representing a combination of extravasated contrast and hemorrhage. No associated abnormal mass effect or midline shift. Interval development of a focal area of hypoattenuation within the right basal ganglia involving the right putamen and adjacent anterior limb of the right internal capsule in keeping with a a developing infarct in this location. Vascular stent noted within the right proximal MCA. No abnormal intra or extra-axial mass lesion. Ventricular size is normal. Cerebellum is unremarkable. Vascular: See above Skull: Normal. Negative for fracture or focal lesion. Sinuses/Orbits: No acute finding. Other: Mastoid air cells and middle ear cavities are clear. IMPRESSION: 1. Interval development of moderate subarachnoid high attenuation material within the right Sylvian fissure and sulci of the right frontal and temporal operculum likely representing a combination of extravasated contrast and hemorrhage. No associated abnormal mass effect or midline shift. 2. Interval development of a focal area of hypoattenuation within the right basal ganglia involving the right putamen and adjacent anterior limb of the right internal capsule in keeping with a developing infarct in this location. Electronically Signed: By: Fidela Salisbury M.D. On: 04/23/2022 21:02   CT ANGIO HEAD NECK W WO CM W PERF (CODE STROKE)  Result Date: 04/23/2022 CLINICAL DATA:  Provided history: Neuro deficit, acute, stroke suspected. EXAM: CT ANGIOGRAPHY HEAD AND NECK CT PERFUSION BRAIN TECHNIQUE: Multidetector CT imaging of the head and neck was performed using the standard protocol during bolus administration of intravenous contrast. Multiplanar CT image reconstructions and MIPs were obtained to evaluate the vascular anatomy. Carotid stenosis measurements (when applicable) are obtained utilizing NASCET criteria, using the distal internal carotid diameter as  the denominator. Multiphase CT imaging of the brain was performed following IV bolus contrast injection. Subsequent parametric perfusion maps were calculated using RAPID software. RADIATION DOSE REDUCTION: This exam was performed according to the departmental dose-optimization program which includes automated exposure control, adjustment of the mA and/or kV according to patient size and/or use of iterative reconstruction technique. CONTRAST:  172m OMNIPAQUE IOHEXOL 350 MG/ML SOLN COMPARISON:  Head CT 04/23/2022.  CT angiogram head 04/08/2022. FINDINGS: CTA NECK FINDINGS Aortic arch: The left vertebral artery arises directly from the aortic arch. Atherosclerotic plaque within the visualized aortic arch and proximal major branch vessels of the neck. Streak and beam hardening artifact arising from a dense right-sided contrast bolus partially obscures the right subclavian artery. Within this limitation, there is no appreciable hemodynamically significant innominate or proximal subclavian artery stenosis. Right carotid system: CCA and ICA patent within the neck. Atherosclerotic plaque.  Most notably, atherosclerotic plaque about the carotid bifurcation and within the proximal ICA results in less than 50% stenosis. Left carotid system: CCA and ICA patent within the neck without stenosis. Atherosclerotic plaque, most notably about the carotid bifurcation. Vertebral arteries: Vertebral arteries patent within the neck. The right vertebral artery is dominant. Calcified atherosclerotic plaque at the origin of the left vertebral artery with suspected at least moderate stenosis. Skeleton: Cervical spondylosis. No acute fracture or aggressive osseous lesion. Other neck: No neck mass or cervical lymphadenopathy. Upper chest: Bullous emphysema. Left apical pleuroparenchymal scarring. No airspace consolidation within the imaged lung apices. Review of the MIP images confirms the above findings CTA HEAD FINDINGS Anterior circulation:  The intracranial internal carotid arteries are patent. Atherosclerotic plaque bilaterally. Most notably, there is a moderate atherosclerotic stenosis within the supraclinoid left ICA. The M1 right middle cerebral artery is occluded proximally (series 12, image 23). There is faint enhancement more distally within the right M1 segment and within some M2 and more distal right MCA vessels (likely due collateral flow). The M1 left middle cerebral artery is patent. Redemonstrated moderate stenoses within the proximal and mid-to-distal aspects of the left M1 segment (series 12, image 22). No left M2 proximal branch occlusion or high-grade proximal stenosis. The anterior cerebral arteries are patent. Atherosclerotic irregularity of both vessels without high-grade proximal stenosis. No intracranial aneurysm is identified. Posterior circulation: The intracranial vertebral arteries are patent. The basilar artery is patent. Redemonstrated moderate/severe stenosis within the proximal basilar artery. The posterior cerebral arteries are patent. Atherosclerotic irregularity of both vessels without high-grade proximal stenosis. Hypoplastic left P1 segment with sizable left posterior communicating artery. The right posterior communicating artery is diminutive or absent. Venous sinuses: Within the limitations of contrast timing, no convincing thrombus. Anatomic variants: As described. Review of the MIP images confirms the above findings CT Brain Perfusion Findings: Bolus quality/timing limits reliability of the perfusion data. CBF (<30%) Volume: 40m Perfusion (Tmax>6.0s) volume: 1062m(right middle cerebral artery vascular territory). Mismatch Volume: 10926mnfarction Location:None identified Occlusion of the proximal M1 right middle cerebral artery, new from the prior CTA head of 04/08/2022. This result, and the perfusion results, were called by telephone at the time of interpretation on 04/23/2022 at 3:14 pm to provider MCNEILL  KIRRed River Surgery Centerwho verbally acknowledged these results. IMPRESSION: CTA neck: 1. The common carotid and internal carotid arteries are patent within the neck. Atherosclerotic plaque bilaterally resulting in less than 50% stenosis. 2. Vertebral arteries patent within the neck. Calcified atherosclerotic plaque at the origin of the non-dominant left vertebral artery with suspected at least moderate stenosis. 3.  Aortic Atherosclerosis (ICD10-I70.0). CTA head: 1. The M1 right middle cerebral artery is occluded proximally, new from the recent prior CTA head of 04/08/2022. 2. Background intracranial atherosclerotic disease, most notably as follows. Moderate stenosis within the supraclinoid left internal carotid artery. Moderate stenoses within the M1 left middle cerebral artery. Moderate/severe stenosis within the proximal basilar artery. CT perfusion head: Bolus quality/timing limits reliability of the perfusion data. Within limitation, no core infarct is identified. The perfusion software identifies a 109 mL region of critically hypoperfused parenchyma within the right MCA vascular territory (utilizing the Tmax>6 seconds threshold). Reported mismatch volume: 109 mL. Electronically Signed   By: KylKellie SimmeringO.   On: 04/23/2022 15:47   CT HEAD CODE STROKE WO CONTRAST  Result Date: 04/23/2022 CLINICAL DATA:  Code stroke.  Neuro deficit, acute, stroke suspected EXAM: CT HEAD WITHOUT CONTRAST TECHNIQUE: Contiguous axial images were obtained from  the base of the skull through the vertex without intravenous contrast. RADIATION DOSE REDUCTION: This exam was performed according to the departmental dose-optimization program which includes automated exposure control, adjustment of the mA and/or kV according to patient size and/or use of iterative reconstruction technique. COMPARISON:  Prior CT head FINDINGS: Brain: Remote right basal ganglia perforator infarct. No evidence of acute large vascular territory infarct, acute  hemorrhage, mass lesion or midline shift. Vascular: No hyperdense vessel identified. Calcific atherosclerosis. Skull: No acute fracture. Sinuses/Orbits: No acute orbital findings. No acute orbital findings. Other: No mastoid effusions. ASPECTS [facility] Stroke Program Early CT Score) total score (0-10 with 10 being normal): 10. IMPRESSION: No evidence of acute intracranial abnormality. Code stroke imaging results were communicated on 04/23/2022 at 2:41 pm to provider Dr. Leonel Ramsay via secure text paging. Electronically Signed   By: Margaretha Sheffield M.D.   On: 04/23/2022 14:41   CT ANGIO HEAD W OR WO CONTRAST  Result Date: 04/08/2022 CLINICAL DATA:  Evaluate for acute infarct, cannot get an MRI EXAM: CT ANGIOGRAPHY HEAD TECHNIQUE: Multidetector CT imaging of the head was performed using the standard protocol during bolus administration of intravenous contrast. Multiplanar CT image reconstructions and MIPs were obtained to evaluate the vascular anatomy. RADIATION DOSE REDUCTION: This exam was performed according to the departmental dose-optimization program which includes automated exposure control, adjustment of the mA and/or kV according to patient size and/or use of iterative reconstruction technique. CONTRAST:  31m OMNIPAQUE IOHEXOL 350 MG/ML SOLN COMPARISON:  No prior CTA available, correlation is made with CT head 04/07/2022 and MRA 10/04/2008 FINDINGS: CT HEAD Brain: No evidence of acute infarct, hemorrhage, mass, mass effect, or midline shift. No hydrocephalus or extra-axial fluid collection. Redemonstrated remote infarct in right basal ganglia and corona radiata, with likely additional remote infarct in the centrum semiovale, with ex vacuo dilatation of the right anterior body of the lateral ventricle. Vascular: No hyperdense vessel. Skull: Normal. Negative for fracture or focal lesion. Sinuses/Orbits: No acute finding. Other: The mastoid air cells are well aerated. CTA HEAD FINDINGS Anterior  circulation: Both internal carotid arteries are patent to the termini, without significant stenosis. A1 segments patent. Normal anterior communicating artery. Anterior cerebral arteries are patent to their distal aspects. Moderate to severe stenosis in the mid right M1 and moderate stenosis in the proximal and distal left M1. Multifocal irregularity in the bilateral MCA branches, particularly an M2 or M3 branch in the insula (series 11, images 113-115). MCA branches perfused to their distal aspects, with slightly less opacification of the right MCA branches than the left. Posterior circulation: Vertebral arteries patent to the vertebrobasilar junction without stenosis. Posterior inferior cerebellar arteries patent proximally. Severe stenosis in the proximal basilar artery (series 11, image 144). Superior cerebellar arteries patent proximally. Patent P1 segments, hypoplastic on the left. Near fetal origin of the left PCA from the left posterior communicating artery. The right posterior communicating artery is not visualized. The PCAs are irregular bilaterally, with mild stenosis in the proximal left P2 (series 11, image 117. Venous sinuses: Not well opacified due to phase of contrast. Anatomic variants: Near fetal origin of the left PCA. Review of the MIP images confirms the above findings IMPRESSION: 1. No acute intracranial process. Remote infarcts in the right basal ganglia and corona radiata. 2. No intracranial large vessel occlusion. 3. 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 4. Multifocal irregularity in the bilateral MCA and PCA branches. Electronically Signed   By: Merilyn Baba M.D.   On: 04/08/2022 20:11   ECHOCARDIOGRAM COMPLETE  Result Date: 04/08/2022    ECHOCARDIOGRAM REPORT   Patient Name:   KAIRAN KHIM Date of Exam: 04/08/2022 Medical Rec #:   [id number]         Height:       73.0 in Accession #:    [id number]        Weight:       133.6 lb Date of Birth:  [birth year]         BSA:          1.813 m Patient Age:    40 years          BP:           119/85 mmHg Patient Gender: M                 HR:           66 bpm. Exam Location:  Inpatient Procedure: 2D Echo, Cardiac Doppler and Color Doppler Indications:    TIA G45.9  History:        Patient has no prior history of Echocardiogram examinations.                 Pacemaker, TIA and COPD; Risk Factors:Hypertension, Dyslipidemia                 and Current Smoker.  Sonographer:    Ronny Flurry Referring Phys: [id number] [place]  1. Left ventricular ejection fraction, by estimation, is 65 to 70%. The left ventricle has normal function. The left ventricle has no regional wall motion abnormalities. Left ventricular diastolic parameters are consistent with Grade I diastolic dysfunction (impaired relaxation).  2. Right ventricular systolic function is normal. The right ventricular size is normal. There is normal pulmonary artery systolic pressure.  3. The mitral valve is normal in structure. No evidence of mitral valve regurgitation. No evidence of mitral stenosis.  4. The aortic valve is normal in structure. Aortic valve regurgitation is not visualized. No aortic stenosis is present.  5. The inferior vena cava is normal in size with <50% respiratory variability, suggesting right atrial pressure of 8 mmHg. FINDINGS  Left Ventricle: Left ventricular ejection fraction, by estimation, is 65 to 70%. The left ventricle has normal function. The left ventricle has no regional wall motion abnormalities. The left ventricular internal cavity size was normal in size. There is  no left ventricular hypertrophy. Left ventricular diastolic parameters are consistent with Grade I diastolic dysfunction (impaired relaxation). Indeterminate filling pressures. Right Ventricle: The right ventricular size is normal. No  increase in right ventricular wall thickness. Right ventricular systolic function is normal. There is normal pulmonary artery systolic pressure. The tricuspid regurgitant velocity is 2.14 m/s, and  with an assumed right atrial pressure of 8 mmHg, the estimated right ventricular systolic pressure is 123456 mmHg. Left Atrium: Left atrial size was normal in size. Right Atrium: Right atrial size was normal in size. Pericardium: There is no evidence of pericardial effusion. Mitral Valve: The mitral valve is normal in structure. No evidence of mitral valve regurgitation. No evidence of mitral valve stenosis. Tricuspid Valve: The tricuspid valve is normal in structure. Tricuspid valve regurgitation is trivial. No evidence of tricuspid stenosis. Aortic Valve: The aortic valve is normal in structure. Aortic valve regurgitation is not visualized. No aortic stenosis is present.  Aortic valve mean gradient measures 2.0 mmHg. Aortic valve peak gradient measures 3.9 mmHg. Pulmonic Valve: The pulmonic valve was normal in structure. Pulmonic valve regurgitation is not visualized. No evidence of pulmonic stenosis. Aorta: The aortic root is normal in size and structure. Venous: The inferior vena cava is normal in size with less than 50% respiratory variability, suggesting right atrial pressure of 8 mmHg. IAS/Shunts: No atrial level shunt detected by color flow Doppler. Additional Comments: A device lead is visualized.   Diastology LV e' medial:   5.75 cm/s LV E/e' medial: 10.5  IVC IVC diam: 1.90 cm RIGHT ATRIUM           Index RA Area:     15.40 cm RA Volume:   38.50 ml  21.24 ml/m  AORTIC VALVE AV Vmax:           98.80 cm/s AV Vmean:          65.200 cm/s AV VTI:            0.201 m AV Peak Grad:      3.9 mmHg AV Mean Grad:      2.0 mmHg LVOT Vmax:         88.63 cm/s LVOT Vmean:        57.100 cm/s LVOT VTI:          0.174 m LVOT/AV VTI ratio: 0.86 MITRAL VALVE               TRICUSPID VALVE MV Area (PHT): 2.91 cm    TR Peak grad:    18.3 mmHg MV Decel Time: 261 msec    TR Vmax:        214.00 cm/s MV E velocity: 60.20 cm/s MV A velocity: 74.90 cm/s  SHUNTS MV E/A ratio:  0.80        Systemic VTI: 0.17 m Skeet Latch MD Electronically signed by Skeet Latch MD Signature Date/Time: 04/08/2022/2:38:15 PM    Final    VAS US CAROTID  Result Date: 04/08/2022 Carotid Arterial Duplex Study Patient Name:  AVRAHAM STADTLER  Date of Exam:   04/08/2022 Medical Rec #: [id number]          Accession #:    [id number] Date of Birth: 1950-07-02          Patient Gender: M Patient Age:   2 years Exam Location[facility]  [facility] Procedure:      VAS US CAROTID Referring Phys: Velna Ochs --------------------------------------------------------------------------------  Indications:       TIA. Risk Factors:      Hypertension, hyperlipidemia, prior CVA. Comparison Study:  No prior studies. Performing Technologist: Darlin Coco RDMS, RVT  Examination Guidelines: A complete evaluation includes B-mode imaging, spectral Doppler, color Doppler, and power Doppler as needed of all accessible portions of each vessel. Bilateral testing is considered an integral part of a complete examination. Limited examinations for reoccurring indications may be performed as noted.  Right Carotid Findings: +----------+--------+--------+--------+------------------+------------------+           PSV cm/sEDV cm/sStenosisPlaque DescriptionComments           +----------+--------+--------+--------+------------------+------------------+ CCA Prox  86      27                                                   +----------+--------+--------+--------+------------------+------------------+ CCA Distal57  17                                intimal thickening +----------+--------+--------+--------+------------------+------------------+ ICA Prox  89      29      1-39%   heterogenous                          +----------+--------+--------+--------+------------------+------------------+ ICA Mid   51      24                                                   +----------+--------+--------+--------+------------------+------------------+ ICA Distal48      22                                                   +----------+--------+--------+--------+------------------+------------------+ ECA       63      19                                                   +----------+--------+--------+--------+------------------+------------------+ +----------+--------+-------+----------------+-------------------+           PSV cm/sEDV cmsDescribe        Arm Pressure (mmHG) +----------+--------+-------+----------------+-------------------+ [id number]             Multiphasic, WNL                    +----------+--------+-------+----------------+-------------------+ +---------+--------+--+--------+--+---------+ VertebralPSV cm/s31EDV cm/s14Antegrade +---------+--------+--+--------+--+---------+  Left Carotid Findings: +----------+--------+--------+--------+------------------+------------------+           PSV cm/sEDV cm/sStenosisPlaque DescriptionComments           +----------+--------+--------+--------+------------------+------------------+ CCA Prox  60      19                                intimal thickening +----------+--------+--------+--------+------------------+------------------+ CCA Distal54      18                                intimal thickening +----------+--------+--------+--------+------------------+------------------+ ICA Prox  47      21      1-39%   heterogenous                         +----------+--------+--------+--------+------------------+------------------+ ICA Mid   53      21                                                   +----------+--------+--------+--------+------------------+------------------+ ICA Distal61      17                                                    +----------+--------+--------+--------+------------------+------------------+  ECA       59      15                                                   +----------+--------+--------+--------+------------------+------------------+ +----------+--------+--------+----------------+-------------------+           PSV cm/sEDV cm/sDescribe        Arm Pressure (mmHG) +----------+--------+--------+----------------+-------------------+ [id number]              Multiphasic, WNL                    +----------+--------+--------+----------------+-------------------+ +---------+--------+--+--------+--+---------+ VertebralPSV cm/s33EDV cm/s11Antegrade +---------+--------+--+--------+--+---------+   Summary: Right Carotid: Velocities in the right ICA are consistent with a 1-39% stenosis. Left Carotid: Velocities in the left ICA are consistent with a 1-39% stenosis. Vertebrals:  Bilateral vertebral arteries demonstrate antegrade flow. Subclavians: Normal flow hemodynamics were seen in bilateral subclavian              arteries. *See table(s) above for measurements and observations.  Electronically signed by Antony Contras MD on 04/08/2022 at 10:45:09 AM.    Final    CT HEAD WO CONTRAST (5MM)  Result Date: 04/07/2022 CLINICAL DATA:  Transient ischemic attack EXAM: CT HEAD WITHOUT CONTRAST TECHNIQUE: Contiguous axial images were obtained from the base of the skull through the vertex without intravenous contrast. RADIATION DOSE REDUCTION: This exam was performed according to the departmental dose-optimization program which includes automated exposure control, adjustment of the mA and/or kV according to patient size and/or use of iterative reconstruction technique. COMPARISON:  08/11/2018 FINDINGS: Brain: No mass, hemorrhage or extra-axial collection. Old right centrum semiovale small vessel infarct with mild ex vacuo dilatation of the right lateral ventricle. CSF spaces are otherwise  normal. White matter is otherwise normal. Vascular: No hyperdense vessel or unexpected calcification. Skull: Negative Sinuses/Orbits: Normal Other: None IMPRESSION: No acute CT finding. Old right centrum semiovale small vessel infarct with mild ex vacuo dilatation of the right lateral ventricle. Electronically Signed   By: Ulyses Jarred M.D.   On: 04/07/2022 22:43      HISTORY OF PRESENT ILLNESS Patient with history of right thalamic stroke 2 weeks ago presented with sudden onset worsened left-sided weakness.  HOSPITAL COURSE Patient was found to be having a new right M1 MCA occlusion and went to IR for mechanical thrombectomy with Tiki 3 flow achieved after 1 past.  However, M1 segment restenosed and a stent was placed for rescue.  Postprocedural CT showed a trace subarachnoid hemorrhage which was noted to be resolving on further imaging.  Stroke:  right MCA stroke due to right M1 occlusion status post mechanical thrombectomy and rescue right MCA stenting Etiology: Large vessel occlusion and stenosis Code Stroke CT head No acute abnormality. ASPECTS 10.    CTA head & neck proximal right M1 occlusion, moderate stenosis in left M1, moderate/severe stenosis in proximal basilar artery CT perfusion no core infarct, 109 mL penumbra Post IR CT 2/15 interval development of moderate subarachnoid high attenuation material within right sylvian fissure, likely combination of extravasated contrast and hemorrhage, interval development of focal area of hypoattenuation in right basal ganglia Follow-up CT 2/16: Diminished subarachnoid hemorrhage at lower right sylvian fissure MRI pending Carotid Doppler 1 to 39% stenosis in right and left ICA 2D Echo EF 65 to [id number], grade 1 diastolic dysfunction, normal left atrial size, no  atrial level shunt LDL 82 HgbA1c 5.3 VTE prophylaxis -SCDs       Diet    Diet regular Room service appropriate? Yes with Assist; Fluid consistency: Thin        aspirin 81 mg daily and  clopidogrel 75 mg daily prior to admission, now on aspirin 81 mg daily and Brilinta (ticagrelor) 90 mg bid due to intracranial stent placement Therapy recommendations: No PT/SLP follow-up Disposition: Home   Hypertension Home meds: Chlorthalidone 25 mg daily, amlodipine 10 mg daily Stable Keep systolic blood pressure 0000000 for 24 hours then less than 180 Long-term BP goal normotensive   Hyperlipidemia Home meds: Atorvastatin 80 mg daily, resumed in hospital LDL 82, goal < 70 Add ezetimibe 10 mg daily Continue statin at discharge   Other Stroke Risk Factors Advanced Age >/= 2  Cigarette smoker advised to stop smoking Hx stroke  RN Pressure Injury Documentation:     DISCHARGE EXAM Blood pressure (!) 118/97, pulse 71, temperature 98.3 F (36.8 C), temperature source Oral, resp. rate 19, weight 64.3 kg, SpO2 100 %. General: Alert, well-nourished, well-developed patient in no acute distress Respiratory: Regular, unlabored respirations on room air  NEURO:  Mental Status: AA&Ox3  Speech/Language: speech is without dysarthria or aphasia.  Naming, repetition, fluency, and comprehension intact.  Cranial Nerves:  II: PERRL. Visual fields full.  III, IV, VI: EOMI. Eyelids elevate symmetrically.  V: Sensation is intact to light touch and symmetrical to face.  VII: Left-sided facial droop VIII: hearing intact to voice. IX, X: Phonation is normal.  XII: tongue is midline without fasciculations. Motor: 5/5 strength to all muscle groups tested.  Tone: is normal and bulk is normal Sensation- Intact to light touch bilaterally.  Coordination: FTN intact bilaterally.No drift.  Gait- deferred   Discharge Diet       Diet   Diet regular Room service appropriate? Yes with Assist; Fluid consistency: Thin   liquids  DISCHARGE PLAN Disposition: Home aspirin 81 mg daily and Brilinta (ticagrelor) 90 mg bid for secondary stroke prevention for at least 3 months then per interventional  radiologist Ongoing stroke risk factor control by Primary Care Physician at time of discharge Follow-up PCP [name], DO in 2 weeks. Patient to follow-up with established neurologist Dr. Posey Pronto at [facility] neurology in 2 to 4 weeks..   25 minutes were spent preparing discharge.  [place] , MSN, AGACNP-BC [facility] See Amion for schedule and pager information 04/25/2022 1:41 PM

## 2022-04-25 NOTE — Evaluation (Signed)
Speech Language Pathology Evaluation Patient Details Name: [name] MRN: [id number] DOB: 16-Sep-1950 Today's Date: 04/25/2022 Time: [id number] SLP Time Calculation (min) (ACUTE ONLY): 13 min  Problem List:  Patient Active Problem List   Diagnosis Date Noted   Stroke (cerebrum) ([place]) 04/23/2022   Malnutrition of moderate degree 04/08/2022   TIA (transient ischemic attack) 12/25/2021   Degenerative disc disease, lumbar 10/16/2021   Unintentional weight loss 01/29/2021   Generalized pruritus 12/31/2020   Hepatic lesion 12/06/2020   Cough 12/06/2020   Bilateral flank pain 06/26/2020   De Quervain's tenosynovitis, left 08/10/2019   Primary osteoarthritis of left knee 07/21/2018   Primary osteoarthritis of right knee 04/06/2018   Baker's cyst of knee, left 04/06/2018   TMJ disease 09/17/2017   Sickle cell trait ([place])    Abdominal aortic atherosclerosis ([place]) 04/21/2016   Seasonal allergies 06/15/2014   Renal cyst, right 02/08/2013   History of tuberculosis 01/19/2013   Sinoatrial node dysfunction ([place]) 04/11/2012   Healthcare maintenance 11/26/2011   Pacemaker 11/25/2011   COPD with asthma ([place])    GERD (gastroesophageal reflux disease) 02/04/2011   Tobacco abuse 07/01/2010   Hyperlipidemia 09/30/2009   MIGRAINE WITHOUT AURA 09/30/2009   ERECTILE DYSFUNCTION, ORGANIC 01/16/2009   Essential hypertension 11/22/2008   Past Medical History:  Past Medical History:  Diagnosis Date   Atrial tachycardia 04/11/2012   CEREBROVASCULAR ACCIDENT 10/17/2008   Acute CVA of right thalamus 10/03/08 ECHO performed 2/2 CVA 10/09/08, EF 55-65%  Aspirin 325 mg daily and try to work on quitting smoking.    Emphysema    per multiple CXRs   Hypertension    goal < 140/90   Sickle cell trait ([facility])    Snoring    Sleep study 09/02/10 was WNL   Stroke [facility]) 10/03/08   Right thalamus   Tuberculosis at age 1-3   Past Surgical History:  Past Surgical History:  Procedure Laterality Date    APPENDECTOMY  1977   PACEMAKER INSERTION  07/24/08   MDT Adapta L implanted by Dr Blanch Media   RADIOLOGY WITH ANESTHESIA N/A 04/23/2022   Procedure: IR WITH ANESTHESIA;  Surgeon: Radiologist, Medication, MD;  Location: [place];  Service: Radiology;  Laterality: N/A;   VASECTOMY  1990   HPI:  72 y.o. male with a history of previous right thalamic stroke who presents with left-sided weakness.  He reports experiencing a TIA 2 weeks ago. He woke up with worsened left-sided weakness which is been present and not improving. found to have a new right M1 occlusion and went to IR for mechanical thrombectomy. TICI 3 flow was achieved after 1 pass, however, patient was noted to have restenosis and required a stent in the M1 segment.  Postprocedural CT showed trace SAH.   Assessment / Plan / Recommendation Clinical Impression  Pt appears to be at baseline for cognitive linguistic functioning. Pt with good recall of hospital events. He was slightly disoriented to temporal orientation however knew month and year. He states no changes in thinking skills since this admission and denies changes to cognitive linguistics following prior CVAs. He reports and he and spouse jointly care for complex ADLs. [employer] Mental Status (SLUMS) administered. Pt achieved 18/30 with deficits in delayed and immediate recall, mental manipulation, and temporal orientation. Pt will adequate support from spouse at DC. He was encouraged to follow up if any further changes noticed with cognitive skills. No deficits appreciated in receptive or expressive language nor motor speech skills. No further ST needs  identified as pt suspected to be at baseline functioning.    SLP Assessment  SLP Recommendation/Assessment: Patient does not need any further Speech [place] [facility] SLP Visit Diagnosis: Cognitive communication deficit (R41.841)    Recommendations for follow up therapy are one component of a multi-disciplinary  discharge planning process, led by the attending physician.  Recommendations may be updated based on patient status, additional functional criteria and insurance authorization.    Follow Up Recommendations       Assistance Recommended at Discharge  None  Functional Status Assessment    Frequency and Duration           SLP Evaluation Cognition  Overall Cognitive Status: Within Functional Limits for tasks assessed (reports no changes, suspected to be at baseline) Arousal/Alertness: Awake/alert Orientation Level: Oriented to person;Oriented to place;Oriented to situation (mild deficits in temporal orientation) Year: 2024 Month: February Day of Week: Incorrect (did not know) Attention: Focused;Sustained Focused Attention: Appears intact Sustained Attention: Appears intact Memory: Impaired Memory Impairment: Decreased recall of new information;Decreased short term memory Awareness: Appears intact Executive Function: Organizing;Sequencing Sequencing: Impaired Sequencing Impairment: Verbal complex;Functional complex Organizing: Impaired Organizing Impairment: Verbal complex;Functional complex Safety/Judgment: Appears intact       Comprehension  Auditory Comprehension Overall Auditory Comprehension: Appears within functional limits for tasks assessed    Expression Expression Primary Mode of Expression: Verbal Verbal Expression Overall Verbal Expression: Appears within functional limits for tasks assessed Written Expression Dominant Hand: Right   Oral / Motor  Oral Motor/Sensory Function Overall Oral Motor/Sensory Function: Within functional limits Motor Speech Overall Motor Speech: Appears within functional limits for tasks assessed            Hayden Rasmussen MA, CCC-SLP Acute Rehabilitation Services   04/25/2022, 11:45 AM

## 2022-04-25 NOTE — Anesthesia Postprocedure Evaluation (Signed)
Anesthesia Post Note  Patient: [name]  Procedure(s) Performed: IR WITH ANESTHESIA     Patient location during evaluation: PACU Anesthesia Type: General Level of consciousness: patient cooperative Pain management: pain level controlled Vital Signs Assessment: post-procedure vital signs reviewed and stable Respiratory status: spontaneous breathing, nonlabored ventilation, respiratory function stable and patient connected to nasal cannula oxygen Cardiovascular status: blood pressure returned to baseline and stable Postop Assessment: no apparent nausea or vomiting Anesthetic complications: no   No notable events documented.  Last Vitals:  Vitals:   04/25/22 1200 04/25/22 1300  BP: (!) 145/117 (!) 118/97  Pulse: 69 71  Resp: 20 19  Temp:    SpO2: 100% 100%    Last Pain:  Vitals:   04/25/22 1100  TempSrc: Oral  PainSc:                  Tayva Easterday

## 2022-04-26 ENCOUNTER — Other Ambulatory Visit: Payer: Self-pay | Admitting: Neurology

## 2022-04-26 DIAGNOSIS — I63512 Cerebral infarction due to unspecified occlusion or stenosis of left middle cerebral artery: Secondary | ICD-10-CM

## 2022-04-27 ENCOUNTER — Telehealth (HOSPITAL_COMMUNITY): Payer: Self-pay

## 2022-04-27 NOTE — Telephone Encounter (Signed)
Pt's wife agreed to f/u in 6 months with Dr. Debbrah Alar. AB

## 2022-04-27 NOTE — Telephone Encounter (Signed)
Returned pt's call, no answer, left vm. AB

## 2022-05-04 ENCOUNTER — Ambulatory Visit (INDEPENDENT_AMBULATORY_CARE_PROVIDER_SITE_OTHER): Payer: 59 | Admitting: Neurology

## 2022-05-04 ENCOUNTER — Encounter: Payer: Self-pay | Admitting: Neurology

## 2022-05-04 VITALS — BP 105/79 | HR 91 | Ht 73.0 in | Wt 134.9 lb

## 2022-05-04 DIAGNOSIS — I63512 Cerebral infarction due to unspecified occlusion or stenosis of left middle cerebral artery: Secondary | ICD-10-CM | POA: Diagnosis not present

## 2022-05-04 NOTE — Progress Notes (Signed)
[facility] Clinic Note - Initial Visit   Date: 05/04/2022   [name] MRN: [id number] DOB: 1951-01-17   Dear Dr. Erlinda Reyes:  Thank you for your kind referral of [name] for consultation of stroke. Although his history is well known to you, please allow [place] to reiterate it for the purpose of our medical record. The patient was accompanied to the clinic by self.   [name] is a 72 y.o. right-handed male with history of right thalamic stroke (2010, manifesting with vision changes), sick sinus syndrome  S/p PPM, history of substance abuse, and tobacco use  presenting for evaluation of R MCA stroke.   IMPRESSION/PLAN: Right MCA stroke due to right M1 occlusion s/p thrombectomy, angioplasty, and stent (04/23/2022). Vascular risk factors:  prior stroke, age, hyperlipidemia, hypertension, cocaine use.  Clinically, he is doing remarkably well with no residual neurological or functional deficits.   His LDL has improved from 128 >> 82.  Continue zetia '10mg'$  daily BP is staying on the lower side and he endorses lightheadedness.  He sees his PCP this week for follow-up and will discuss medication changes for BP. Continue aspirin '81mg'$  and Brillinta '90mg'$  BID, as per Interventional Radiology. Counseled patient to stop using cocaine  Return to clinic in 6 months  ------------------------------------------------------------- History of present illness: In January, he was having spells of left side weakness and treated with dual antiplatelets with aspirin and plavix '75mg'$  for TIA, but missed a few doses and woke up with worsening left side weakness which prompted him to go to the ER on 2/15.  CTA showed right M1 occlusion with significant penumbra.  He underwent IR mechanical thromectomy with TICI3 flow acheieved.  However, M1 segment became restenosed and he underwent stent.  Postprocedure CT shows trace SAH, which was resolving on subsequent CT. MRI unable to  be performed due to incompatible pacemaker.  Urine drug screen was positive for cocaine. Upon discharge he was started on aspirin '81mg'$  and Brilinta '90mg'$  BID.  He denies any ongoing weakness, numbness, tingling, speech/swallow difficulty, or vision changes.    Out-side paper records, electronic medical record, and images have been reviewed where available and summarized as:  Lab Results  Component Value Date   HGBA1C 5.3 04/07/2022   Lab Results  Component Value Date   VITAMINB12 311 08/10/2018   Lab Results  Component Value Date   TSH 3.080 01/28/2021   Lab Results  Component Value Date   ESRSEDRATE 2 10/16/2021    Past Medical History:  Diagnosis Date   Atrial tachycardia 04/11/2012   CEREBROVASCULAR ACCIDENT 10/17/2008   Acute CVA of right thalamus 10/03/08 ECHO performed 2/2 CVA 10/09/08, EF 55-65%  Aspirin 325 mg daily and try to work on quitting smoking.    Emphysema    per multiple CXRs   Hypertension    goal < 140/90   Sickle cell trait (HCC)    Snoring    Sleep study 09/02/10 was WNL   Stroke [facility]) 10/03/08   Right thalamus   Tuberculosis at age 60-3    Past Surgical History:  Procedure Laterality Date   APPENDECTOMY  1977   IR CT HEAD LTD  04/23/2022   IR CT HEAD LTD  04/23/2022   IR PERCUTANEOUS ART THROMBECTOMY/INFUSION INTRACRANIAL INC DIAG ANGIO  04/23/2022   IR PTA INTRACRANIAL  04/23/2022   IR US GUIDE VASC ACCESS RIGHT  04/23/2022   IR US GUIDE VASC ACCESS RIGHT  04/23/2022   PACEMAKER INSERTION  07/24/08   MDT Adapta L implanted by Dr Blanch Media   RADIOLOGY WITH ANESTHESIA N/A 04/23/2022   Procedure: IR WITH ANESTHESIA;  Surgeon: Radiologist, Medication, MD;  Location: [place];  Service: Radiology;  Laterality: N/A;   VASECTOMY  1990     Medications:  Outpatient Encounter Medications as of 05/04/2022  Medication Sig   amLODipine-atorvastatin (CADUET) 10-80 MG tablet Take 1 tablet by mouth daily.   aspirin EC 81 MG tablet Take 1 tablet (81 mg total) by mouth daily.  Swallow whole.   cetirizine (ZYRTEC ALLERGY) 10 MG tablet Take 1 tablet (10 mg total) by mouth daily.   chlorthalidone (HYGROTON) 25 MG tablet Take 1 tablet (25 mg total) by mouth daily.   ezetimibe (ZETIA) 10 MG tablet Take 1 tablet (10 mg total) by mouth daily.   ticagrelor (BRILINTA) 90 MG TABS tablet Take 1 tablet (90 mg total) by mouth 2 (two) times daily.   No facility-administered encounter medications on file as of 05/04/2022.    Allergies:  Allergies  Allergen Reactions   Ace Inhibitors Swelling and Other (See Comments)    Patient presented with right upper lip swelling along with tingling and numbness. Possible allergic reaction to ACEI.    Family History: Family History  Problem Relation Age of Onset   Hypertension Mother    Cancer Sister        Unknown   Hypertension Sister    Diabetes Sister    Cancer Sister    Sickle cell trait Son    Sickle cell anemia Son     Social History: Social History   Tobacco Use   Smoking status: Every Day    Packs/day: 0.10    Years: 10.00    Total pack years: 1.00    Types: Cigarettes   Smokeless tobacco: Never   Tobacco comments:    Cutting back. DOWN TO 1-2 CIAGARETTES  Vaping Use   Vaping Use: Former  Substance Use Topics   Alcohol use: No    Alcohol/week: 0.0 standard drinks of alcohol   Drug use: No    Comment: previous polysubstance abuser, quit x 13 years   Social History   Social History Narrative   Current Social History 06/29/2019        Patient lives with spouse in a ground floor apartment which is 1 story. There are not steps up to the entrance the patient uses.       Patient's method of transportation is personal car.      The highest level of education was Bachelor's Degree; now working on [employer].      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      Identified important Relationships are "My wife"      Pets : None        Interests / Fun: Computers, "I'm an Optician, dispensing."       Current Stressors: "I hardly let anything bother me. I walk away"       Religious / Personal Beliefs: [employer]       L. [place]atte, BSN, RN-BC        Vital Signs:  BP 105/79   Pulse 91   Ht '6\' 1"'$  (1.854 m)   Wt 134 lb 14.4 oz (61.2 kg)   SpO2 100%   BMI 17.80 kg/m   Neurological Exam: MENTAL STATUS including orientation to time, place, person, recent and remote memory, attention span and concentration,  language, and fund of knowledge is normal.  Speech is not dysarthric.  CRANIAL NERVES: II:  No visual field defects.     III-IV-VI: Pupils equal round and reactive to light.  Normal conjugate, extra-ocular eye movements in all directions of gaze.  No nystagmus.  No ptosis.   V:  Normal facial sensation.    VII:  Normal facial symmetry and movements.   VIII:  Normal hearing and vestibular function.   IX-X:  Normal palatal movement.   XI:  Normal shoulder shrug and head rotation.   XII:  Normal tongue strength and range of motion, no deviation or fasciculation.  MOTOR:  No atrophy, fasciculations or abnormal movements.  No pronator drift.   Upper Extremity:  Right  Left  Deltoid  5/5   5/5   Biceps  5/5   5/5   Triceps  5/5   5/5   Wrist extensors  5/5   5/5   Wrist flexors  5/5   5/5   Finger extensors  5/5   5/5   Finger flexors  5/5   5/5   Dorsal interossei  5/5   5/5   Abductor pollicis  5/5   5/5   Tone (Ashworth scale)  0  0   Lower Extremity:  Right  Left  Hip flexors  5/5   5/5   Knee flexors  5/5   5/5   Knee extensors  5/5   5/5   Dorsiflexors  5/5   5/5   Plantarflexors  5/5   5/5   Toe extensors  5/5   5/5   Toe flexors  5/5   5/5   Tone (Ashworth scale)  0  0   MSRs:                                           Right        Left brachioradialis 2+  2+  biceps 2+  2+  triceps 2+  2+  patellar 2+  2+  ankle jerk 2+  2+  Hoffman no  no  plantar response down  down   SENSORY:   Normal and symmetric perception of light touch, pinprick, vibration, and proprioception.  Romberg's sign absent.   COORDINATION/GAIT: Normal finger-to- nose-finger.  Intact rapid alternating movements bilaterally.  Gait narrow based and stable. Tandem and stressed gait intact.     Thank you for allowing me to participate in patient's care.  If I can answer any additional questions, I would be pleased to do so.    Sincerely,    Odile Veloso K. Posey Pronto, DO

## 2022-05-04 NOTE — Patient Instructions (Signed)
Please follow-up with your primary care doctor for blood pressure check  Follow-up with interventional radiology  Return to clinic in 6 months

## 2022-05-05 ENCOUNTER — Other Ambulatory Visit: Payer: Self-pay | Admitting: Student

## 2022-05-07 ENCOUNTER — Encounter: Payer: Self-pay | Admitting: Internal Medicine

## 2022-05-07 ENCOUNTER — Telehealth: Payer: Self-pay | Admitting: *Deleted

## 2022-05-07 ENCOUNTER — Ambulatory Visit (INDEPENDENT_AMBULATORY_CARE_PROVIDER_SITE_OTHER): Payer: 59 | Admitting: Internal Medicine

## 2022-05-07 ENCOUNTER — Other Ambulatory Visit: Payer: Self-pay

## 2022-05-07 ENCOUNTER — Ambulatory Visit: Payer: 59

## 2022-05-07 VITALS — BP 123/89 | HR 82 | Temp 97.7°F | Ht 73.0 in | Wt 133.5 lb

## 2022-05-07 DIAGNOSIS — R2681 Unsteadiness on feet: Secondary | ICD-10-CM

## 2022-05-07 DIAGNOSIS — I495 Sick sinus syndrome: Secondary | ICD-10-CM

## 2022-05-07 DIAGNOSIS — Z8673 Personal history of transient ischemic attack (TIA), and cerebral infarction without residual deficits: Secondary | ICD-10-CM

## 2022-05-07 DIAGNOSIS — E440 Moderate protein-calorie malnutrition: Secondary | ICD-10-CM | POA: Diagnosis not present

## 2022-05-07 DIAGNOSIS — D573 Sickle-cell trait: Secondary | ICD-10-CM

## 2022-05-07 DIAGNOSIS — J449 Chronic obstructive pulmonary disease, unspecified: Secondary | ICD-10-CM

## 2022-05-07 DIAGNOSIS — E785 Hyperlipidemia, unspecified: Secondary | ICD-10-CM

## 2022-05-07 DIAGNOSIS — E7849 Other hyperlipidemia: Secondary | ICD-10-CM

## 2022-05-07 DIAGNOSIS — I10 Essential (primary) hypertension: Secondary | ICD-10-CM

## 2022-05-07 DIAGNOSIS — Z681 Body mass index (BMI) 19 or less, adult: Secondary | ICD-10-CM

## 2022-05-07 DIAGNOSIS — J4489 Other specified chronic obstructive pulmonary disease: Secondary | ICD-10-CM

## 2022-05-07 DIAGNOSIS — I700 Atherosclerosis of aorta: Secondary | ICD-10-CM

## 2022-05-07 LAB — CUP PACEART REMOTE DEVICE CHECK
Battery Impedance: 2605 Ohm
Battery Remaining Longevity: 29 mo
Battery Voltage: 2.75 V
Brady Statistic AP VP Percent: 0 %
Brady Statistic AP VS Percent: 17 %
Brady Statistic AS VP Percent: 1 %
Brady Statistic AS VS Percent: 82 %
Date Time Interrogation Session: 20240229093304
Implantable Lead Connection Status: 753985
Implantable Lead Connection Status: 753985
Implantable Lead Implant Date: 20101105
Implantable Lead Implant Date: 20101105
Implantable Lead Location: 753859
Implantable Lead Location: 753860
Implantable Lead Model: 5076
Implantable Lead Model: 5076
Implantable Pulse Generator Implant Date: 20101105
Lead Channel Impedance Value: 458 Ohm
Lead Channel Impedance Value: 503 Ohm
Lead Channel Pacing Threshold Amplitude: 0.625 V
Lead Channel Pacing Threshold Amplitude: 0.875 V
Lead Channel Pacing Threshold Pulse Width: 0.4 ms
Lead Channel Pacing Threshold Pulse Width: 0.4 ms
Lead Channel Setting Pacing Amplitude: 2 V
Lead Channel Setting Pacing Amplitude: 2.5 V
Lead Channel Setting Pacing Pulse Width: 0.4 ms
Lead Channel Setting Sensing Sensitivity: 2.8 mV
Zone Setting Status: 755011
Zone Setting Status: 755011

## 2022-05-07 MED ORDER — ATORVASTATIN CALCIUM 80 MG PO TABS: 80.0000 mg | ORAL_TABLET | Freq: Every day | ORAL | 3 refills | Status: DC

## 2022-05-07 MED ORDER — AEROCHAMBER PLUS FLO-VU MEDIUM MISC: 1.0000 | Freq: Once | 0 refills | Status: AC

## 2022-05-07 NOTE — Assessment & Plan Note (Signed)
Continue atorvastatin 80 mg daily and Zetia 10 mg daily

## 2022-05-07 NOTE — Assessment & Plan Note (Signed)
Have changed his combination pill to just atorvastatin 80 mg alone he is also taking Zetia 10 mg

## 2022-05-07 NOTE — Progress Notes (Signed)
Established Patient Office Visit  Subjective   Patient ID: [name], male    DOB: 06/28/1950  Age: 72 y.o. MRN: [id number]  Chief Complaint  Patient presents with   Medication Refill    Albuterol w/spacer.   HFU Visit    At his last visit he was found to have a TIA and admitted to the hospital he was placed on escalated therapy to control risk factors unfortunately he still had a right MCA territory LVO about 2 weeks later fortunately he presented to the hospital quickly and was able to to be evaluated by IR with  stent placement with excellent results and he was able to be discharged from the hospital without significant residual weakness.  His dual antiplatelet therapy was changed from aspirin and Plavix to aspirin and Brilinta.  His blood pressure was noted to be soft at the neurology follow-up and was advised he may hold the medication until he sees me he is feeling better today and his blood pressure has improved.  He is additionally requesting an albuterol inhaler he has historically been prescribed this for a COPD with asthma overlap syndrome he is a former smoker no longer smoking.  Since discharge she has felt a little bit more short of breath mainly with different activities he has not recognize any overt wheezing.   Patient Active Problem List   Diagnosis Date Noted   BMI less than 19,adult 05/07/2022   History of CVA (cerebrovascular accident) 04/23/2022   Malnutrition of moderate degree 04/08/2022   Degenerative disc disease, lumbar 10/16/2021   Unintentional weight loss 01/29/2021   Generalized pruritus 12/31/2020   Hepatic lesion 12/06/2020   Cough 12/06/2020   Bilateral flank pain 06/26/2020   De Quervain's tenosynovitis, left 08/10/2019   Primary osteoarthritis of left knee 07/21/2018   Primary osteoarthritis of right knee 04/06/2018   Baker's cyst of knee, left 04/06/2018   TMJ disease 09/17/2017   Sickle cell trait ([place])    Abdominal aortic  atherosclerosis ([place]) 04/21/2016   Seasonal allergies 06/15/2014   Renal cyst, right 02/08/2013   History of tuberculosis 01/19/2013   Sinoatrial node dysfunction ([place]) 04/11/2012   Healthcare maintenance 11/26/2011   Pacemaker 11/25/2011   COPD with asthma ([place])    GERD (gastroesophageal reflux disease) 02/04/2011   Tobacco abuse 07/01/2010   Hyperlipidemia 09/30/2009   MIGRAINE WITHOUT AURA 09/30/2009   ERECTILE DYSFUNCTION, ORGANIC 01/16/2009   Essential hypertension 11/22/2008   Past Medical History:  Diagnosis Date   Atrial tachycardia 04/11/2012   CEREBROVASCULAR ACCIDENT 10/17/2008   Acute CVA of right thalamus 10/03/08 ECHO performed 2/2 CVA 10/09/08, EF 55-65%  Aspirin 325 mg daily and try to work on quitting smoking.    Emphysema    per multiple CXRs   Hypertension    goal < 140/90   Sickle cell trait (HCC)    Snoring    Sleep study 09/02/10 was WNL   Stroke [facility]) 10/03/2008   Right thalamus   TIA (transient ischemic attack) 12/25/2021   Tuberculosis at age 64-3          Objective:     BP 123/89 (BP Location: Left Arm, Patient Position: Sitting, Cuff Size: Large)   Pulse 82   Temp 97.7 F (36.5 C) (Oral)   Ht '6\' 1"'$  (1.854 m)   Wt 133 lb 8 oz (60.6 kg)   SpO2 100% Comment: RA  BMI 17.61 kg/m  BP Readings from Last 3 Encounters:  05/07/22 123/89  05/04/22 105/79  04/25/22 (!) 118/97   Wt Readings from Last 3 Encounters:  05/07/22 133 lb 8 oz (60.6 kg)  05/04/22 134 lb 14.4 oz (61.2 kg)  04/23/22 141 lb 12.1 oz (64.3 kg)      Physical Exam Constitutional:      Appearance: Normal appearance.     Comments: In transport wheelchair  Pulmonary:     Effort: Pulmonary effort is normal.     Breath sounds: Normal breath sounds. No wheezing.  Musculoskeletal:     Right lower leg: No edema.     Left lower leg: No edema.  Neurological:     General: No focal deficit present.     Mental Status: He is alert.  Psychiatric:        Mood and Affect: Mood  normal.        Behavior: Behavior normal.      Results for orders placed or performed in visit on 05/07/22  CUP PACEART REMOTE DEVICE CHECK  Result Value Ref Range   Date Time Interrogation Session [id number]    Pulse Generator Manufacturer MERM    Pulse Gen Model [id number] Adapta    Pulse Gen Serial Number [id number]    Clinic Name [place] Pulse Generator Type Implantable Pulse Generator    Implantable Pulse Generator Implant Date OR:8611548    Implantable Lead Manufacturer MERM    Implantable Lead Model 5076 CapSureFix Novus    Implantable Lead Serial Number [id number]    Implantable Lead Implant Date OR:8611548    Implantable Lead Location [id number]    Implantable Lead Connection Status [id number]    Implantable Lead Manufacturer MERM    Implantable Lead Model 5076 CapSureFix Novus    Implantable Lead Serial Number [id number]    Implantable Lead Implant Date OR:8611548    Implantable Lead Location [id number]    Implantable Lead Connection Status [id number]    Lead Channel Setting Sensing Sensitivity 2.80 mV   Lead Channel Setting Pacing Amplitude 2.000 V   Lead Channel Setting Pacing Pulse Width 0.40 ms   Lead Channel Setting Pacing Amplitude 2.500 V   Zone Setting Status 755011    Zone Setting Status 755011    Lead Channel Impedance Value 503 ohm   Lead Channel Pacing Threshold Amplitude 0.625 V   Lead Channel Pacing Threshold Pulse Width 0.40 ms   Lead Channel Impedance Value 458 ohm   Lead Channel Pacing Threshold Amplitude 0.875 V   Lead Channel Pacing Threshold Pulse Width 0.40 ms   Battery Status OK    Battery Remaining Longevity 29 mo   Battery Voltage 2.75 V   Battery Impedance 2,605 ohm   Brady Statistic AP VP Percent 0 %   Brady Statistic AS VP Percent 1 %   Brady Statistic AP VS Percent 17 %   Brady Statistic AS VS Percent 82 %    Last CBC Lab Results  Component Value Date   WBC 6.3 04/25/2022   HGB 9.9 (L) 04/25/2022   HCT 27.2 (L) 04/25/2022    MCV 84.2 04/25/2022   MCH 30.7 04/25/2022   RDW 14.8 04/25/2022   PLT 183 [id number]   Last metabolic panel Lab Results  Component Value Date   GLUCOSE 105 (H) 04/25/2022   NA 138 04/25/2022   K 3.2 (L) 04/25/2022   CL 107 04/25/2022   CO2 24 04/25/2022   BUN <5 (L) 04/25/2022   CREATININE 0.83 04/25/2022   GFRNONAA >60 04/25/2022   CALCIUM 8.3 (L) 04/25/2022  PHOS 2.0 (L) 04/25/2022   PROT 7.4 04/23/2022   ALBUMIN 3.8 04/23/2022   LABGLOB 2.4 12/25/2021   AGRATIO 1.8 12/25/2021   BILITOT 0.5 04/23/2022   ALKPHOS 110 04/23/2022   AST 31 04/23/2022   ALT 28 04/23/2022   ANIONGAP 7 04/25/2022   Last hemoglobin A1c Lab Results  Component Value Date   HGBA1C 5.3 04/07/2022      The ASCVD Risk score (Arnett DK, et al., 2019) failed to calculate for the following reasons:   The patient has a prior MI or stroke diagnosis    Assessment & Plan:   Problem List Items Addressed This Visit       Cardiovascular and Mediastinum   Sinoatrial node dysfunction ([place]) (Chronic)    He has a permanent pacemaker and follows with EP.      Relevant Medications   atorvastatin (LIPITOR) 80 MG tablet   Essential hypertension (Chronic)    He saw neurology for follow-up 3 days ago at that time he was having some symptomatic hypotension and he was told to hold his antihypertensives until he sees me.  He has not been taking his amlodipine atorvastatin or chlorthalidone and his blood pressure is essentially at goal.  I advised we will continue to hold antihypertensives for now he will obtain a blood pressure cuff and start monitoring his blood pressure daily.      Relevant Medications   atorvastatin (LIPITOR) 80 MG tablet   Other Relevant Orders   AMB Referral to Chronic Care Management Services   Abdominal aortic atherosclerosis (HCC) (Chronic)    Have changed his combination pill to just atorvastatin 80 mg alone he is also taking Zetia 10 mg      Relevant Medications   atorvastatin  (LIPITOR) 80 MG tablet     Respiratory   COPD with asthma ([place]) (Chronic)    His albuterol inhaler was discontinued at the recent hospitalizations unclear reason for this.  I have represcribed him albuterol with a spacer.  He is having some increased shortness of breath no wheezing on exam.  There is the possibility that his shortness of breath could be related to the Brilinta but I am not convinced of this at this time so we will focus on treating COPD as cause of his dyspnea      Relevant Medications   Spacer/Aero-Holding Chambers (AEROCHAMBER PLUS FLO-VU MEDIUM) MISC     Other   Sickle cell trait ([employer]) (Chronic)    Patient has sickle cell trait this is not causing any active concerns.      Hyperlipidemia (Chronic)    Continue atorvastatin 80 mg daily and Zetia 10 mg daily      Relevant Medications   atorvastatin (LIPITOR) 80 MG tablet   History of CVA (cerebrovascular accident) - Primary (Chronic)    He was recently evaluated in the hospital for TIA he returned about 2 weeks later with left-sided paralysis and was found to have a right MCA LVO he underwent stenting with IR with excellent results.  He has followed up with [facility] neurology Dr. Posey Pronto and has overall been doing well however.  He is accompanied by his daughter today she notes some generalized weakness and difficulty ambulating around the house even with his walker.  He was evaluated by PT OT and SLP in the hospital without recommendations for any home health however she is concerned that he is not able to adequately manage at home and is requesting repeat evaluations.  I think this is  reasonable and we will reorder PT and OT home health evaluations.      Relevant Orders   Ambulatory referral to [place] Referral to Chronic Care Management Services   Malnutrition of moderate degree   BMI less than 19,adult    Wt has stabilized, he is drinking 1 ensure a day.      Other Visit Diagnoses     Unsteady gait when  walking       Relevant Orders   Ambulatory referral to Home Health   Chronic obstructive pulmonary disease, unspecified COPD type ([place])   (Chronic)         Return in about 3 months (around 08/05/2022).    [name], DO

## 2022-05-07 NOTE — Assessment & Plan Note (Signed)
His albuterol inhaler was discontinued at the recent hospitalizations unclear reason for this.  I have represcribed him albuterol with a spacer.  He is having some increased shortness of breath no wheezing on exam.  There is the possibility that his shortness of breath could be related to the Brilinta but I am not convinced of this at this time so we will focus on treating COPD as cause of his dyspnea

## 2022-05-07 NOTE — Assessment & Plan Note (Signed)
He has a permanent pacemaker and follows with EP.

## 2022-05-07 NOTE — Assessment & Plan Note (Signed)
Patient has sickle cell trait this is not causing any active concerns.

## 2022-05-07 NOTE — Assessment & Plan Note (Signed)
Wt has stabilized, he is drinking 1 ensure a day.

## 2022-05-07 NOTE — Telephone Encounter (Signed)
PCS form faxed to Sandre Kitty [phone] / [phone] ) for review [place] for assessment appointment.

## 2022-05-07 NOTE — Assessment & Plan Note (Addendum)
He was recently evaluated in the hospital for TIA he returned about 2 weeks later with left-sided paralysis and was found to have a right MCA LVO he underwent stenting with IR with excellent results.  He has followed up with [facility] neurology Dr. Posey Pronto and has overall been doing well however.  He is accompanied by his daughter today she notes some generalized weakness and difficulty ambulating around the house even with his walker.  He was evaluated by PT OT and SLP in the hospital without recommendations for any home health however she is concerned that he is not able to adequately manage at home and is requesting repeat evaluations.  I think this is reasonable and we will reorder PT and OT home health evaluations.

## 2022-05-07 NOTE — Assessment & Plan Note (Signed)
He saw neurology for follow-up 3 days ago at that time he was having some symptomatic hypotension and he was told to hold his antihypertensives until he sees me.  He has not been taking his amlodipine atorvastatin or chlorthalidone and his blood pressure is essentially at goal.  I advised we will continue to hold antihypertensives for now he will obtain a blood pressure cuff and start monitoring his blood pressure daily.

## 2022-05-08 ENCOUNTER — Telehealth: Payer: Self-pay

## 2022-05-08 NOTE — Progress Notes (Signed)
   Care Guide Note  05/08/2022 Name: [name] MRN: [id number] DOB: 07-05-1950  Referred by: [name], DO Reason for referral : No chief complaint on file.   [name] is a 72 y.o. year old male who is a primary care patient of [name], DO. [name] was referred to the pharmacist for assistance related to DM.    Successful contact was made with the patient to discuss pharmacy services including being ready for the pharmacist to call at least 5 minutes before the scheduled appointment time, to have medication bottles and any blood sugar or blood pressure readings ready for review. The patient agreed to meet with the pharmacist via with the pharmacist via telephone visit on (date/time).  06/03/2022  Noreene Larsson, [place], [place] [address] Direct Dial: [phone] [email or web address]

## 2022-05-08 NOTE — Progress Notes (Signed)
  Care Coordination  Note  05/08/2022 Name: [name] MRN: [id number] DOB: Sep 22, 1950  [name] is a 72 y.o. year old male who is a primary care patient of [name], DO. I reached out to NIVEN CAPPUCCIO by phone today to offer care coordination services.      Mr. Grzyb was given information about Care Coordination services today including:  The Care Coordination services include support from the care team which includes your Nurse Coordinator, Clinical Social Worker, or Pharmacist.  The Care Coordination team is here to help remove barriers to the health concerns and goals most important to you. Care Coordination services are voluntary and the patient may decline or stop services at any time by request to their care team member.   Patient agreed to services and verbal consent obtained.   Follow up plan: Telephone appointment with care coordination team member scheduled for:05/11/2022  Noreene Larsson, [place], [place] [address] Direct Dial: [phone] [email or web address]

## 2022-05-11 ENCOUNTER — Telehealth: Payer: Self-pay | Admitting: *Deleted

## 2022-05-11 ENCOUNTER — Telehealth: Payer: 59

## 2022-05-11 NOTE — Telephone Encounter (Signed)
   CCM RN Visit Note   05/11/22 Name: TAVIST HUDNALL MRN: [id number]      DOB: August 18, 1950  Subjective: [name] is a 72 y.o. year old male who is a primary care patient of Joni Reining DO The patient was referred to the Chronic Care Management team for assistance with care management needs subsequent to provider initiation of CCM services and plan of care.      An unsuccessful telephone outreach was attempted today to contact the patient about Chronic Care Management needs.    Plan:Telephone follow up appointment with care management team member scheduled for:  upon care guide rescheduling.  Jacqlyn Larsen [facility], BSN RN Case Manager [facility] Internal Medicine [phone]

## 2022-05-12 ENCOUNTER — Emergency Department (HOSPITAL_BASED_OUTPATIENT_CLINIC_OR_DEPARTMENT_OTHER)
Admission: EM | Admit: 2022-05-12 | Discharge: 2022-05-13 | Discharge status: Against Medical Advice | Payer: 59 | Attending: Emergency Medicine | Admitting: Emergency Medicine

## 2022-05-12 ENCOUNTER — Encounter (HOSPITAL_BASED_OUTPATIENT_CLINIC_OR_DEPARTMENT_OTHER): Payer: Self-pay | Admitting: Emergency Medicine

## 2022-05-12 ENCOUNTER — Emergency Department (HOSPITAL_BASED_OUTPATIENT_CLINIC_OR_DEPARTMENT_OTHER): Payer: 59 | Admitting: Radiology

## 2022-05-12 ENCOUNTER — Other Ambulatory Visit: Payer: Self-pay

## 2022-05-12 ENCOUNTER — Other Ambulatory Visit: Payer: Self-pay | Admitting: Student

## 2022-05-12 DIAGNOSIS — K5900 Constipation, unspecified: Secondary | ICD-10-CM | POA: Diagnosis not present

## 2022-05-12 DIAGNOSIS — Z5321 Procedure and treatment not carried out due to patient leaving prior to being seen by health care provider: Secondary | ICD-10-CM | POA: Insufficient documentation

## 2022-05-12 NOTE — ED Triage Notes (Signed)
Patient reports not being able to have a BM x 2 days.  Patient reports using miralax today without relief.

## 2022-05-12 NOTE — ED Notes (Signed)
Called patient to bring back to room, no answer.

## 2022-05-14 ENCOUNTER — Ambulatory Visit (INDEPENDENT_AMBULATORY_CARE_PROVIDER_SITE_OTHER): Payer: 59

## 2022-05-14 ENCOUNTER — Other Ambulatory Visit: Payer: Self-pay

## 2022-05-14 ENCOUNTER — Encounter (HOSPITAL_COMMUNITY): Payer: Self-pay | Admitting: Emergency Medicine

## 2022-05-14 ENCOUNTER — Ambulatory Visit (HOSPITAL_COMMUNITY)
Admission: EM | Admit: 2022-05-14 | Discharge: 2022-05-14 | Disposition: A | Discharge status: Home / Self Care | Payer: 59 | Attending: Internal Medicine | Admitting: Internal Medicine

## 2022-05-14 DIAGNOSIS — K5901 Slow transit constipation: Secondary | ICD-10-CM

## 2022-05-14 DIAGNOSIS — R109 Unspecified abdominal pain: Secondary | ICD-10-CM

## 2022-05-14 MED ORDER — SENNOSIDES-DOCUSATE SODIUM 8.6-50 MG PO TABS: 1.0000 | ORAL_TABLET | Freq: Every day | ORAL | 1 refills | Status: DC

## 2022-05-14 NOTE — ED Triage Notes (Signed)
Pain in lower bart of stomach.   Denies issues with urinating, but when he does urinate, lower abdominal pain is severe.    Has miralax.  Patient was discharged by hospital 3 weeks ago.  Reports minimum to know bms since surgery,  small  stools amounts occur,

## 2022-05-14 NOTE — Discharge Instructions (Addendum)
Please increase fiber intake Please take Senokot nightly. Please take MiraLAX as needed if you having had a bowel movement in 2 days. Increase oral fluid intake X-ray of the abdomen is significant for constipation If you have abdominal distention, vomiting or worsening abdominal pain please return to urgent care to be evaluated.

## 2022-05-14 NOTE — ED Provider Notes (Addendum)
[place]    CSN: [id number] Arrival date & time: 05/14/22  1628      History   Chief Complaint Chief Complaint  Patient presents with   Abdominal Pain    HPI [name] is a 72 y.o. male comes to the urgent care with lower abdominal pain over the past week or so.  Patient was recently discharged from the hospital 3 weeks ago.  He had right MCA stroke status post right M1 thrombectomy and angioplasty with stent placement.  Patient was advised not to strain.  He has not been straining with bowel movements.  He denies any abdominal distention.  He continues to have lower abdominal pain with fullness.  The pain is aggravated by urinating as well as trying to have a bowel movement.  No fever or chills.  No vomiting.  Patient does not eat a lot of fruits or vegetables.  Her oral fluid intake is fair.  His last bowel movement was this morning.  He is taking MiraLAX over the past couple of days.  HPI  Past Medical History:  Diagnosis Date   Atrial tachycardia 04/11/2012   CEREBROVASCULAR ACCIDENT 10/17/2008   Acute CVA of right thalamus 10/03/08 ECHO performed 2/2 CVA 10/09/08, EF 55-65%  Aspirin 325 mg daily and try to work on quitting smoking.    Emphysema    per multiple CXRs   Hypertension    goal < 140/90   Sickle cell trait (HCC)    Snoring    Sleep study 09/02/10 was WNL   Stroke ([place]) 10/03/2008   Right thalamus   TIA (transient ischemic attack) 12/25/2021   Tuberculosis at age 70-3    Patient Active Problem List   Diagnosis Date Noted   BMI less than 19,adult 05/07/2022   History of CVA (cerebrovascular accident) 04/23/2022   Malnutrition of moderate degree 04/08/2022   Degenerative disc disease, lumbar 10/16/2021   Unintentional weight loss 01/29/2021   Generalized pruritus 12/31/2020   Hepatic lesion 12/06/2020   Cough 12/06/2020   Bilateral flank pain 06/26/2020   De Quervain's tenosynovitis, left 08/10/2019   Primary osteoarthritis of left knee  07/21/2018   Primary osteoarthritis of right knee 04/06/2018   Baker's cyst of knee, left 04/06/2018   TMJ disease 09/17/2017   Sickle cell trait ([place])    Abdominal aortic atherosclerosis ([place]) 04/21/2016   Seasonal allergies 06/15/2014   Renal cyst, right 02/08/2013   History of tuberculosis 01/19/2013   Sinoatrial node dysfunction ([place]) 04/11/2012   Healthcare maintenance 11/26/2011   Pacemaker 11/25/2011   COPD with asthma ([place])    GERD (gastroesophageal reflux disease) 02/04/2011   Tobacco abuse 07/01/2010   Hyperlipidemia 09/30/2009   MIGRAINE WITHOUT AURA 09/30/2009   ERECTILE DYSFUNCTION, ORGANIC 01/16/2009   Essential hypertension 11/22/2008    Past Surgical History:  Procedure Laterality Date   APPENDECTOMY  1977   IR CT HEAD LTD  04/23/2022   IR CT HEAD LTD  04/23/2022   IR PERCUTANEOUS ART THROMBECTOMY/INFUSION INTRACRANIAL INC DIAG ANGIO  04/23/2022   IR PTA INTRACRANIAL  04/23/2022   IR US GUIDE VASC ACCESS RIGHT  04/23/2022   IR US GUIDE VASC ACCESS RIGHT  04/23/2022   PACEMAKER INSERTION  07/24/08   MDT Adapta L implanted by Dr Blanch Media   RADIOLOGY WITH ANESTHESIA N/A 04/23/2022   Procedure: IR WITH ANESTHESIA;  Surgeon: Radiologist, Medication, MD;  Location: [facility];  Service: Radiology;  Laterality: N/A;   [place]  Home Medications    Prior to Admission medications   Medication Sig Start Date End Date Taking? Authorizing Provider  senna-docusate (SENOKOT-S) 8.6-50 MG tablet Take 1 tablet by mouth at bedtime. 05/14/22  Yes Tyla Burgner, Myrene Galas, MD  albuterol (VENTOLIN HFA) 108 (90 Base) MCG/ACT inhaler INHALE 2 PUFFS BY MOUTH EVERY 6 HOURS AS NEEDED FOR WHEEZING OR SHORTNESS OF BREATH 05/06/22   [name], DO  aspirin EC 81 MG tablet Take 1 tablet (81 mg total) by mouth daily. Swallow whole. 04/26/22   de Yolanda Manges, Cortney E, NP  atorvastatin (LIPITOR) 80 MG tablet Take 1 tablet (80 mg total) by mouth daily. 05/07/22   [name], DO  cetirizine  (ZYRTEC ALLERGY) 10 MG tablet Take 1 tablet (10 mg total) by mouth daily. 01/15/22 01/15/23  Virl Axe, MD  ezetimibe (ZETIA) 10 MG tablet Take 1 tablet (10 mg total) by mouth daily. 04/09/22   Angelique Blonder, DO  ticagrelor (BRILINTA) 90 MG TABS tablet Take 1 tablet (90 mg total) by mouth 2 (two) times daily. 04/25/22   de Yolanda Manges, Altamease Oiler, NP    Family History Family History  Problem Relation Age of Onset   Hypertension Mother    Cancer Sister        Unknown   Hypertension Sister    Diabetes Sister    Cancer Sister    Sickle cell trait Son    Sickle cell anemia Son     Social History Social History   Tobacco Use   Smoking status: Former    Packs/day: 0.10    Years: 10.00    Total pack years: 1.00    Types: Cigarettes   Smokeless tobacco: Never  Vaping Use   Vaping Use: Former  Substance Use Topics   Alcohol use: No    Alcohol/week: 0.0 standard drinks of alcohol   Drug use: No    Comment: previous polysubstance abuser, quit x 13 years     Allergies   Ace inhibitors   Review of Systems Review of Systems As per HPI  Physical Exam Triage Vital Signs ED Triage Vitals  Enc Vitals Group     BP 05/14/22 1730 (!) 117/90     Pulse Rate 05/14/22 1730 91     Resp 05/14/22 1730 18     Temp 05/14/22 1730 98.1 F (36.7 C)     Temp Source 05/14/22 1730 Oral     SpO2 05/14/22 1730 96 %     Weight --      Height --      Head Circumference --      Peak Flow --      Pain Score 05/14/22 1727 8     Pain Loc --      Pain Edu? --      Excl. in [place]? --    No data found.  Updated Vital Signs BP (!) 117/90 (BP Location: Right Arm)   Pulse 91   Temp 98.1 F (36.7 C) (Oral)   Resp 18   SpO2 96%   Visual Acuity Right Eye Distance:   Left Eye Distance:   Bilateral Distance:    Right Eye Near:   Left Eye Near:    Bilateral Near:     Physical Exam Vitals and nursing note reviewed.  Constitutional:      General: He is not in acute distress.    Appearance:  He is not ill-appearing.  Abdominal:     General: Bowel sounds are  normal.     Palpations: Abdomen is soft. There is no hepatomegaly, splenomegaly or mass.     Tenderness: There is abdominal tenderness in the suprapubic area. There is no guarding or rebound. Negative signs include Murphy's sign and McBurney's sign.  Neurological:     Mental Status: He is alert.      UC Treatments / Results  Labs (all labs ordered are listed, but only abnormal results are displayed) Labs Reviewed  POCT URINALYSIS DIPSTICK, ED / UC    EKG   Radiology DG Abd 1 View  Result Date: 05/14/2022 CLINICAL DATA:  Abdominal pain. EXAM: ABDOMEN - 1 VIEW COMPARISON:  05/12/2022 FINDINGS: The bowel gas pattern is normal without gaseous distention. No radio-opaque calculi or other significant radiographic abnormality are seen. Increased stool noted consistent with constipation. IMPRESSION: Constipation. Electronically Signed   By: Sammie Bench M.D.   On: 05/14/2022 18:27   DG Abdomen 1 View  Result Date: 05/12/2022 CLINICAL DATA:  Constipation. Patient reports no bowel movement for 2 days. EXAM: ABDOMEN - 1 VIEW COMPARISON:  CT 11/06/2020 FINDINGS: No bowel dilatation or evidence of obstruction. Small to moderate volume of stool in the colon. Small volume of stool in the rectum. No radiopaque calculi. Aortic atherosclerosis. The lung bases are clear. Pacemaker wires partially visualized. No acute osseous findings. IMPRESSION: Small to moderate colonic stool burden.  No bowel obstruction. Electronically Signed   By: Keith Rake M.D.   On: 05/12/2022 20:43    Procedures Procedures (including critical care time)  Medications Ordered in UC Medications - No data to display  Initial Impression / Assessment and Plan / UC Course  I have reviewed the triage vital signs and the nursing notes.  Pertinent labs & imaging results that were available during my care of the patient were reviewed by me and considered in  my medical decision making (see chart for details).     1.  Slow transit constipation: KUB significant for increased stool burden Senokot nightly MiraLAX as needed for constipation Increase oral fluid intake Increase fiber intake Records of the hospital admission was reviewed during this encounter. Return precautions given. Final Clinical Impressions(s) / UC Diagnoses   Final diagnoses:  Slow transit constipation     Discharge Instructions      Please increase fiber intake Please take Senokot nightly. Please take MiraLAX as needed if you having had a bowel movement in 2 days. Increase oral fluid intake X-ray of the abdomen is significant for constipation If you have abdominal distention, vomiting or worsening abdominal pain please return to urgent care to be evaluated.   ED Prescriptions     Medication Sig Dispense Auth. Provider   senna-docusate (SENOKOT-S) 8.6-50 MG tablet Take 1 tablet by mouth at bedtime. 30 tablet Henley Blyth, Myrene Galas, MD      PDMP not reviewed this encounter.   Chase Picket, MD 05/14/22 1900    Chase Picket, MD 05/14/22 1901

## 2022-05-18 ENCOUNTER — Emergency Department (HOSPITAL_COMMUNITY)
Admission: EM | Admit: 2022-05-18 | Discharge: 2022-05-18 | Disposition: A | Discharge status: Home / Self Care | Payer: 59 | Attending: Emergency Medicine | Admitting: Emergency Medicine

## 2022-05-18 ENCOUNTER — Emergency Department (HOSPITAL_COMMUNITY): Payer: 59

## 2022-05-18 ENCOUNTER — Other Ambulatory Visit: Payer: Self-pay

## 2022-05-18 ENCOUNTER — Encounter (HOSPITAL_COMMUNITY): Payer: Self-pay

## 2022-05-18 DIAGNOSIS — Z7982 Long term (current) use of aspirin: Secondary | ICD-10-CM | POA: Insufficient documentation

## 2022-05-18 DIAGNOSIS — S39012A Strain of muscle, fascia and tendon of lower back, initial encounter: Secondary | ICD-10-CM | POA: Diagnosis not present

## 2022-05-18 DIAGNOSIS — Y92410 Unspecified street and highway as the place of occurrence of the external cause: Secondary | ICD-10-CM | POA: Insufficient documentation

## 2022-05-18 DIAGNOSIS — S3992XA Unspecified injury of lower back, initial encounter: Secondary | ICD-10-CM | POA: Diagnosis present

## 2022-05-18 DIAGNOSIS — R930 Abnormal findings on diagnostic imaging of skull and head, not elsewhere classified: Secondary | ICD-10-CM | POA: Insufficient documentation
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
## 2022-05-18 NOTE — ED Notes (Signed)
Pt to CT

## 2022-05-19 ENCOUNTER — Telehealth: Payer: Self-pay | Admitting: Internal Medicine

## 2022-05-19 ENCOUNTER — Other Ambulatory Visit: Payer: Self-pay | Admitting: Internal Medicine
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
## 2022-05-20 ENCOUNTER — Ambulatory Visit (INDEPENDENT_AMBULATORY_CARE_PROVIDER_SITE_OTHER): Payer: 59 | Admitting: Internal Medicine

## 2022-05-20 ENCOUNTER — Telehealth: Payer: Self-pay

## 2022-05-20 ENCOUNTER — Encounter: Payer: Self-pay | Admitting: Internal Medicine

## 2022-05-20 DIAGNOSIS — S134XXD Sprain of ligaments of cervical spine, subsequent encounter: Secondary | ICD-10-CM | POA: Diagnosis not present

## 2022-05-20 MED ORDER — TIZANIDINE HCL 4 MG PO CAPS: 4.0000 mg | ORAL_CAPSULE | Freq: Three times a day (TID) | ORAL | 0 refills | Status: AC
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
## 2022-05-20 NOTE — Progress Notes (Signed)
  Care Coordination Note  05/20/2022 Name: [name] MRN: [id number] DOB: Dec 19, 1950  [name] is a 72 y.o. year old male who is a primary care patient of [name], DO and is actively engaged with the Chronic Care Management team. I reached out to [name] by phone today to assist with re-scheduling a follow up visit with the RN Case Manager  Follow up plan: Unsuccessful telephone outreach attempt made. A HIPAA compliant phone message was left for the patient providing contact information and requesting a return call.  The care management team will reach out to the patient again over the next 7 days.  If patient returns call to provider office, please advise to call [facility]  at [place], [place], [place] [address] Direct Dial: [phone] [name]
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
## 2022-05-20 NOTE — Telephone Encounter (Signed)
Returned call to patient. No answer. Left message on VM requesting return call.  °
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
## 2022-05-22 NOTE — Progress Notes (Signed)
Internal Medicine Clinic Attending ° °Case discussed with Dr. Atway  At the time of the visit.  We reviewed the resident’s history and exam and pertinent patient test results.  I agree with the assessment, diagnosis, and plan of care documented in the resident’s note.  °

## 2022-06-03 ENCOUNTER — Telehealth: Payer: Self-pay | Admitting: Pharmacist

## 2022-06-03 ENCOUNTER — Other Ambulatory Visit: Payer: 59 | Admitting: Pharmacist

## 2022-06-03 NOTE — Progress Notes (Signed)
Attempted to contact patient for scheduled appointment for medication management. Left HIPAA compliant message for patient to return my call at their convenience.   Catie T. Falon Flinchum, PharmD, BCACP, CPP [facility] [phone]  

## 2022-06-04 NOTE — Progress Notes (Signed)
  Chronic Care Management Note  06/04/2022 Name: VIRAAJ BIEHL MRN: [id number] DOB: 01/25/1951  CHANS EWERT is a 71 y.o. year old male who is a primary care patient of [name], DO and is actively engaged with the Chronic Care Management team. I reached out to Sharia Reeve by phone today to assist with re-scheduling an initial visit with the RN Case Manager  Follow up plan: Unsuccessful telephone outreach attempt made. A HIPAA compliant phone message was left for the patient providing contact information and requesting a return call.  The care management team will reach out to the patient again over the next 7 days.  If patient returns call to provider office, please advise to call [facility]  at [place], [place], [place] [address] Direct Dial: [phone] [email or web address]

## 2022-06-05 NOTE — Progress Notes (Signed)
Remote pacemaker transmission.   

## 2022-06-12 ENCOUNTER — Telehealth: Payer: Self-pay

## 2022-06-12 NOTE — Telephone Encounter (Signed)
Call back received from Pt.  States he sent manual transmission because he felt his heart racing.  He states that it has since returned to normal.  Pt states he recently had a stroke and was concerned.  Advised would have Dr. Nelly Laurence review and call Pt back.  Pt in agreement and agreeable to any recommendations from Dr. Nelly Laurence.

## 2022-06-12 NOTE — Telephone Encounter (Addendum)
Alert received from [facility]:  Pt. initiated transmission, AF in progress, no hx of PAF, no OAC per EPIC, ASA only Route to triage  Pt with history of short episodes of atach-no known afib/flutter. Attempted to contact Pt regarding manual transmission sent.  Unable to leave a message on Pt's phone number, attempted to contact spouse and left message requesting call back.  Will forward to Dr. Nelly Laurence for review advisement.

## 2022-06-16 ENCOUNTER — Telehealth: Payer: Self-pay | Admitting: *Deleted

## 2022-06-16 NOTE — Telephone Encounter (Incomplete)
Spoke with Raynelle Fanning (wife) left message with her regarding assessment appointment : 06-19-2022 @ 8:30 am. Per office visit will be by phone.  His referral was faxed on February 29.2024 to [facility] for review and scheduling. Ph -[phone]

## 2022-06-17 ENCOUNTER — Encounter: Payer: 59 | Admitting: Student

## 2022-06-18 NOTE — Telephone Encounter (Signed)
Left detailed message for Pt and EC.  Upon return call-need to schedule f/u appointment with Dr. Nelly Laurence to discuss new afib/start Eliquis.  Pt currently taking ASA and Brilinta.

## 2022-06-18 NOTE — Telephone Encounter (Signed)
Pt scheduled to see Dr. Nelly Laurence to discuss [facility].

## 2022-06-19 ENCOUNTER — Encounter: Payer: Self-pay | Admitting: Cardiovascular Disease

## 2022-06-19 ENCOUNTER — Ambulatory Visit: Payer: 59 | Attending: Cardiovascular Disease | Admitting: Cardiovascular Disease

## 2022-06-19 VITALS — BP 128/90 | HR 64 | Ht 73.0 in | Wt 150.4 lb

## 2022-06-19 DIAGNOSIS — I480 Paroxysmal atrial fibrillation: Secondary | ICD-10-CM

## 2022-06-19 MED ORDER — METOPROLOL TARTRATE 25 MG PO TABS: 25.0000 mg | ORAL_TABLET | Freq: Two times a day (BID) | ORAL | 6 refills | Status: DC

## 2022-06-19 MED ORDER — METOPROLOL TARTRATE 25 MG PO TABS: 12.5000 mg | ORAL_TABLET | Freq: Two times a day (BID) | ORAL | 6 refills | Status: DC

## 2022-06-19 NOTE — Progress Notes (Signed)
Cardiology Office Note:    Date:  06/19/2022   ID:  [name], DOB May 13, 1950, MRN [id number]  PCP:  Gust Rung, DO   [facility] Providers Cardiologist:  None Electrophysiologist:  Maurice Small, MD     Referring MD: Gust Rung, DO     History of Present Illness:    [name] is a 72 y.o. male with a hx of symptomatic bradycardia with a medtronic dual chamber pacemaker placed by Dr. Lynnea Ferrier in 2010.  Since his last visit with me, he was admitted with a MCA stroke.  He underwent thrombectomy, atherectomy, and placement of a stent.  Thus, the stroke was thought to be due to atheroma rather than cardioembolism.  Approximately 2 months later, his device has detected atrial fibrillation.  The patient reports he was aware of the episode and felt rapid heart rates.  The episode appears to have lasted only about 2 hours.  He has not had any other episodes of atrial fibrillation.   he has no device related complaints -- no new tenderness, drainage, redness.      Past Medical History:  Diagnosis Date   Atrial tachycardia 04/11/2012   CEREBROVASCULAR ACCIDENT 10/17/2008   Acute CVA of right thalamus 10/03/08 ECHO performed 2/2 CVA 10/09/08, EF 55-65%  Aspirin 325 mg daily and try to work on quitting smoking.    Emphysema    per multiple CXRs   Hypertension    goal < 140/90   Sickle cell trait    Snoring    Sleep study 09/02/10 was WNL   Stroke 10/03/2008   Right thalamus   TIA (transient ischemic attack) 12/25/2021   Tuberculosis at age 35-3    Past Surgical History:  Procedure Laterality Date   APPENDECTOMY  1977   IR CT HEAD LTD  04/23/2022   IR CT HEAD LTD  04/23/2022   IR PERCUTANEOUS ART THROMBECTOMY/INFUSION INTRACRANIAL INC DIAG ANGIO  04/23/2022   IR PTA INTRACRANIAL  04/23/2022   IR US GUIDE VASC ACCESS RIGHT  04/23/2022   IR US GUIDE VASC ACCESS RIGHT  04/23/2022   PACEMAKER INSERTION  07/24/08   MDT Adapta L implanted by Dr Fredrich Birks    RADIOLOGY WITH ANESTHESIA N/A 04/23/2022   Procedure: IR WITH ANESTHESIA;  Surgeon: Radiologist, Medication, MD;  Location: [facility] OR;  Service: Radiology;  Laterality: N/A;   VASECTOMY  1990    Current Medications: Current Meds  Medication Sig   albuterol (VENTOLIN HFA) 108 (90 Base) MCG/ACT inhaler INHALE 2 PUFFS BY MOUTH EVERY 6 HOURS AS NEEDED FOR WHEEZING OR SHORTNESS OF BREATH   aspirin EC 81 MG tablet Take 1 tablet (81 mg total) by mouth daily. Swallow whole.   atorvastatin (LIPITOR) 80 MG tablet Take 1 tablet (80 mg total) by mouth daily.   cetirizine (ZYRTEC ALLERGY) 10 MG tablet Take 1 tablet (10 mg total) by mouth daily.   ezetimibe (ZETIA) 10 MG tablet Take 1 tablet (10 mg total) by mouth daily.   senna-docusate (SENOKOT-S) 8.6-50 MG tablet Take 1 tablet by mouth at bedtime.   ticagrelor (BRILINTA) 90 MG TABS tablet Take 1 tablet (90 mg total) by mouth 2 (two) times daily.     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[employer]  Tobacco Use   Smoking status: Former    Packs/day: 0.10    Years: 10.00    Additional pack years: 0.00    Total pack years: 1.00    Types: Cigarettes   Smokeless tobacco: Never  Vaping Use   Vaping Use: Former  Substance and Sexual Activity   Alcohol use: No    Alcohol/week: 0.0 standard drinks of alcohol   Drug use: No    Comment: previous polysubstance abuser, quit x 13 years   Sexual activity: Yes    Birth control/protection: Condom  Other Topics Concern   Not on file  Social History Narrative   Current Social History 06/29/2019        Patient lives with spouse in a ground floor apartment which is 1 story. There are not steps up to the entrance the patient uses.       Patient's method of  transportation is personal car.      The highest level of education was Bachelor's Degree; now working on [employer].      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      Identified important Relationships are "My wife"      Pets : None       Interests / Fun: Computers, "I'm an Chief Technology Officer."       Current Stressors: "I hardly let anything bother me. I walk away"       Religious / Personal Beliefs: Pentecostal Holiness       L. Ducatte, BSN, RN-BC       Social Determinants of Health   Financial Resource Strain: Medium Risk (01/15/2022)   Overall Financial Resource Strain (CARDIA)    Difficulty of Paying Living Expenses: Somewhat hard  Food Insecurity: No Food Insecurity (04/07/2022)   Hunger Vital Sign    Worried About Running Out of Food in the Last Year: Never true    Ran Out of Food in the Last Year: Never true  Recent Concern: Food Insecurity - Food Insecurity Present (01/15/2022)   Hunger Vital Sign    Worried About Running Out of Food in the Last Year: Often true    Ran Out of Food in the Last Year: Sometimes true  Transportation Needs: No Transportation Needs (04/07/2022)   PRAPARE - Administrator, Civil Service (Medical): No    Lack of Transportation (Non-Medical): No  Physical Activity: Insufficiently Active (01/15/2022)   Exercise Vital Sign    Days of Exercise per Week: 3 days    Minutes of Exercise per Session: 20 min  Stress: Stress Concern Present (01/15/2022)   [employer] of Occupational Health - Occupational Stress Questionnaire    Feeling of Stress : To some extent  Social Connections: Socially Integrated (01/15/2022)   Social Connection and Isolation Panel [NHANES]    Frequency of Communication with Friends and Family: More than three times a week    Frequency of Social Gatherings with Friends and Family: Once a week    Attends Religious Services: More than 4 times  per year    Active Member of [employer] or Organizations: Yes    Attends Banker Meetings: Never    Marital Status: Married     Family History: The patient's family history includes Cancer in his sister and sister; Diabetes in his sister; Hypertension in his mother and sister;  Sickle cell anemia in his son; Sickle cell trait in his son.  ROS:   Please see the history of present illness.    All other systems reviewed and are negative.  EKGs/Labs/Other Studies Reviewed:     EKG:  Last EKG results: today - sinus rhythm and atrial pacing   Recent Labs: 04/23/2022: ALT 28 04/25/2022: BUN <5; Creatinine, Ser 0.83; Hemoglobin 9.9; Magnesium 1.9; Platelets 183; Potassium 3.2; Sodium 138    Physical Exam:    VS:  BP (!) 128/90 (BP Location: Left Arm)   Pulse 64   Ht 6\' 1"  (1.854 m)   Wt 150 lb 6.4 oz (68.2 kg)   BMI 19.84 kg/m     Wt Readings from Last 3 Encounters:  06/19/22 150 lb 6.4 oz (68.2 kg)  05/20/22 139 lb 11.2 oz (63.4 kg)  05/18/22 145 lb (65.8 kg)     GEN:  Appears thin, in no acute distress CARDIAC: RRR, no murmurs, rubs, gallops RESPIRATORY:  Normal work of breathing MUSCULOSKELETAL: no edema    ASSESSMENT & PLAN:    Atrial fibrillation Newly diagnosed, detected with pacemaker - single episode, lasted about two hours He felt increased heart rate and palpitations Continue to monitor with pacemaker Since it was a single, brief episode, I think the risk of anticoagulation outweighs the benefit  Medtronic dual chamber pacemaker:  normal device function.  Low pacing burden.  Battery > 3 years.  MCA stroke S/p atherectomy and stent Continue ASA and brillinta Not attributable to AF  Follow-up 6 months         Medication Adjustments/Labs and Tests Ordered: Current medicines are reviewed at length with the patient today.  Concerns regarding medicines are outlined above.  Orders Placed This Encounter  Procedures   EKG 12-Lead   No orders  of the defined types were placed in this encounter.    Signed, Maurice Small, MD  06/19/2022 10:59 AM    [facility]

## 2022-06-19 NOTE — Patient Instructions (Addendum)
Medication Instructions:  Your physician has recommended you make the following change in your medication:  START Metoprolol Tartrate (Lopressor) 12.5 mg twice daily  *If you need a refill on your cardiac medications before your next appointment, please call your pharmacy*   Lab Work: None ordered If you have labs (blood work) drawn today and your tests are completely normal, you will receive your results only by: MyChart Message (if you have MyChart) OR A paper copy in the mail If you have any lab test that is abnormal or we need to change your treatment, we will call you to review the results.   Testing/Procedures: None ordered   Follow-Up: At [facility], you and your health needs are our priority.  As part of our continuing mission to provide you with exceptional heart care, we have created designated Provider Care Teams.  These Care Teams include your primary Cardiologist (physician) and Advanced Practice Providers (APPs -  Physician Assistants and Nurse Practitioners) who all work together to provide you with the care you need, when you need it.  Your next appointment:   6 month(s)  The format for your next appointment:   In Person  Provider:   York Pellant, MD    Thank you for choosing [facility]!!   [phone]  Other Instructions  Metoprolol Tablets What is this medication? METOPROLOL (me TOE proe lole) treats high blood pressure. It also prevents chest pain (angina) or further damage after a heart attack. It works by lowering your blood pressure and heart rate, making it easier for your heart to pump blood to the rest of your body. It belongs to a group of medications called beta blockers. This medicine may be used for other purposes; ask your health care provider or pharmacist if you have questions. COMMON BRAND NAME(S): Lopressor What should I tell my care team before I take this medication? They need to know if you have any of these  conditions: Diabetes Heart or vessel disease, such as slow heartbeat, worsening heart failure, heart block, sick sinus syndrome, or Raynaud syndrome Kidney disease Liver disease Lung or breathing disease, such as asthma or emphysema Pheochromocytoma Thyroid disease An unusual or allergic reaction to metoprolol, other medications, foods, dyes, or preservatives Pregnant or trying to get pregnant Breastfeeding How should I use this medication? Take this medication by mouth with water. Take it as directed on the prescription label at the same time every day. You can take it with or without food. You should always take it the same way. Keep taking it unless your care team tells you to stop. Talk to your care team about the use of this medication in children. Special care may be needed. Overdosage: If you think you have taken too much of this medicine contact a poison control center or emergency room at once. NOTE: This medicine is only for you. Do not share this medicine with others. What if I miss a dose? If you miss a dose, take it as soon as you can. If it is almost time for your next dose, take only that dose. Do not take double or extra doses. What may interact with this medication? This medication may interact with the following: Certain medications for blood pressure, heart disease, irregular heartbeat Certain medications for depression like monoamine oxidase (MAO) inhibitors, fluoxetine, or paroxetine Clonidine Dobutamine Epinephrine Isoproterenol Reserpine This list may not describe all possible interactions. Give your health care provider a list of all the medicines, herbs, non-prescription drugs, or  dietary supplements you use. Also tell them if you smoke, drink alcohol, or use illegal drugs. Some items may interact with your medicine. What should I watch for while using this medication? Visit your care team for regular checks on your progress. Check your blood pressure as  directed. Know what your blood pressure should be and when to contact your care team. Do not treat yourself for coughs, colds, or pain while you are using this medication without asking your care team for advice. Some medications may increase your blood pressure. This medication may affect your coordination, reaction time, or judgment. Do not drive or operate machinery until you know how this medication affects you. Sit up or stand slowly to reduce the risk of dizzy or fainting spells. Drinking alcohol with this medication can increase the risk of these side effects. This medication may increase blood sugar. Ask your care team if changes in diet or medications are needed if you have diabetes. What side effects may I notice from receiving this medication? Side effects that you should report to your care team as soon as possible: Allergic reactions--skin rash, itching, hives, swelling of the face, lips, tongue, or throat Heart failure--shortness of breath, swelling of the ankles, feet, or hands, sudden weight gain, unusual weakness or fatigue Low blood pressure--dizziness, feeling faint or lightheaded, blurry vision Raynaud's--cool, numb, or painful fingers or toes that may change color from pale, to blue, to red Slow heartbeat--dizziness, feeling faint or lightheaded, confusion, trouble breathing, unusual weakness or fatigue Worsening mood, feelings of depression Side effects that usually do not require medical attention (report to your care team if they continue or are bothersome): Change in sex drive or performance Diarrhea Dizziness Fatigue Headache This list may not describe all possible side effects. Call your doctor for medical advice about side effects. You may report side effects to FDA at 1-800-FDA-1088. Where should I keep my medication? Keep out of the reach of children and pets. Store at room temperature between 15 and 30 degrees C (59 and 86 degrees F). Protect from moisture. Keep  the container tightly closed. Throw away any unused medication after the expiration date. NOTE: This sheet is a summary. It may not cover all possible information. If you have questions about this medicine, talk to your doctor, pharmacist, or health care provider.  2023 Elsevier/Gold Standard (2021-01-24 00:00:00)

## 2022-06-19 NOTE — Addendum Note (Signed)
Addended by: Sherle Poe R on: 06/19/2022 11:21 AM   Modules accepted: Orders

## 2022-06-22 NOTE — Progress Notes (Signed)
  Chronic Care Management Note  06/22/2022 Name: [name] MRN: [id number] DOB: [birth year]  [name] is a 72 y.o. year old male who is a primary care patient of Gust Rung, DO and is actively engaged with the Chronic Care Management team. I reached out to Saul Fordyce by phone today to assist with re-scheduling an initial visit with the RN Case Manager and Pharmacist  Follow up plan: Unable to make contact on outreach attempts x 2. PCP Gust Rung, DO notified via routed documentation in medical record.   Penne Lash, RMA Care Guide [facility]  [place], [place] [address] Direct Dial: [phone] [email or web address]

## 2022-06-24 ENCOUNTER — Encounter: Payer: Self-pay | Admitting: Student

## 2022-06-24 ENCOUNTER — Other Ambulatory Visit: Payer: Self-pay

## 2022-06-24 ENCOUNTER — Ambulatory Visit (INDEPENDENT_AMBULATORY_CARE_PROVIDER_SITE_OTHER): Payer: 59 | Admitting: Student

## 2022-06-24 VITALS — BP 141/108 | HR 67 | Temp 98.1°F | Resp 28 | Ht 72.0 in | Wt 149.0 lb

## 2022-06-24 DIAGNOSIS — Z0000 Encounter for general adult medical examination without abnormal findings: Secondary | ICD-10-CM

## 2022-06-24 DIAGNOSIS — J4489 Other specified chronic obstructive pulmonary disease: Secondary | ICD-10-CM | POA: Diagnosis not present

## 2022-06-24 DIAGNOSIS — I480 Paroxysmal atrial fibrillation: Secondary | ICD-10-CM | POA: Insufficient documentation

## 2022-06-24 DIAGNOSIS — Z681 Body mass index (BMI) 19 or less, adult: Secondary | ICD-10-CM | POA: Diagnosis not present

## 2022-06-24 DIAGNOSIS — Z87891 Personal history of nicotine dependence: Secondary | ICD-10-CM

## 2022-06-24 DIAGNOSIS — I10 Essential (primary) hypertension: Secondary | ICD-10-CM | POA: Diagnosis not present

## 2022-06-24 DIAGNOSIS — Z1211 Encounter for screening for malignant neoplasm of colon: Secondary | ICD-10-CM

## 2022-06-24 DIAGNOSIS — Z23 Encounter for immunization: Secondary | ICD-10-CM | POA: Diagnosis not present

## 2022-06-24 DIAGNOSIS — Z720 Tobacco use: Secondary | ICD-10-CM

## 2022-06-24 MED ORDER — AMLODIPINE BESYLATE 5 MG PO TABS: 5.0000 mg | ORAL_TABLET | Freq: Every day | ORAL | 11 refills | Status: DC

## 2022-06-24 MED ORDER — NICOTINE 21 MG/24HR TD PT24: 21.0000 mg | MEDICATED_PATCH | Freq: Every day | TRANSDERMAL | 0 refills | Status: DC

## 2022-06-24 MED ORDER — NICOTINE POLACRILEX 4 MG MT GUM: 4.0000 mg | CHEWING_GUM | OROMUCOSAL | 1 refills | Status: DC | PRN

## 2022-06-24 MED ORDER — INCRUSE ELLIPTA 62.5 MCG/ACT IN AEPB: 1.0000 | INHALATION_SPRAY | Freq: Every day | RESPIRATORY_TRACT | 2 refills | Status: DC

## 2022-06-24 NOTE — Assessment & Plan Note (Signed)
He does continue to use cigarettes at home, but states that he has cut down to about 2-3 cigarettes/day. He is interested in quitting. States that he has chantix at home but he does not want to take any more pills in addition to his regular medication regimen. We discussed the option of NRT with patch + gum. He is interested in this so will send prescription for both.  Plan: -NRT with patch + gum -continue counseling at subsequent visits

## 2022-06-24 NOTE — Patient Instructions (Addendum)
Mr. [name],  It was a pleasure seeing you in the clinic today.   I have prescribed a new inhaler for you called incruse ellipta. Please use this once daily. Please ask the pharmacist to show you how to use this inhaler. I have prescribed a blood pressure medicine called amlodipine. Please take this once daily at home. I have prescribed nicotine patch and gum for you to use at home in order to stop cigarettes. Please come back in 2 weeks for your next visit with Dr. Mikey Bussing.  Please call our clinic at [phone] if you have any questions or concerns. The best time to call is Monday-Friday from 9am-4pm, but there is someone available 24/7 at the same number. If you need medication refills, please notify your pharmacy one week in advance and they will send [place] a request.   Thank you for letting us take part in your care. We look forward to seeing you next time!

## 2022-06-24 NOTE — Assessment & Plan Note (Signed)
Received Tdap vaccine.  Placed GI referral for screening colonoscopy.

## 2022-06-24 NOTE — Assessment & Plan Note (Addendum)
Patient with elevated BP in clinic today (149/113, 161/88, 141/108). He was previously on amlodipine  daily and chlorthalidone  daily but these were stopped due to symptomatic hypotension while hospitalized. He is on lopressor 12.5mg  BID as of last week due to episode of pAF. Will restart amlodipine at reduced dose (  daily) for better control of HTN.  Plan: -continue lopressor 12.5mg  BID -start norvasc  daily -f/u in 2 weeks for BP recheck

## 2022-06-24 NOTE — Assessment & Plan Note (Addendum)
Patient noted to have a brief, single episode of atrial fibrillation last week. He does have a medtronic dual chamber pacemaker in place. He saw Dr. Nelly Laurence (EP) last week. Per his note, risks of anticoagulation outweigh benefits. He is on ASA and brillinta given recent MCA stroke with thrombectomy, angioplasty, and stent placement. He was started on lopressor 12.5mg  BID for rate control.

## 2022-06-24 NOTE — Assessment & Plan Note (Signed)
Weight is stable, BMI 20.21 today.

## 2022-06-24 NOTE — Assessment & Plan Note (Signed)
Patient with COPD with asthma. Has >50 pack-year smoking history and emphysematous changes noted on multiple CXRs and CT chest last year. PFTs in the past showed mild air flow limitation with reversible changes, consistent with asthma. He likely has overlap. He continues to experience dyspnea consistently at home. He is on an albuterol inhaler, but states that he is not finding any relief with this. He was recently started on Brillinta after recent CVA with thrombectomy, angioplasty, and stent placement (04/2022). There is a possibility that patient's dyspnea is related to medication adverse affect. He does not have any wheezing on exam and his oxygen saturation is 100% on RA.   Given likely COPD, he may be under-controlled with albuterol inhaler alone. No recent hospitalizations or ED visits for COPD. Will start LAMA therapy for maintenance and continue prn SABA as rescue. If he is not finding any relief even with addition of LAMA therapy, may need to closely consider possibility of medication adverse effect from [place]. Of note, he was also noted to have a brief single episode of pAF and question utility of eliquis therapy in place of Brillinta. Will message patient's EP physician and neurologist about concerns to decide on appropriate management.   Plan: -start incruse ellipta -continue SABA prn -if no improvement, consider medication adverse effect of Brillinta  Addendum: Discussed with Dr. Nelly Laurence (EP) and neurology. Would prefer continuation of Brillinta given recent stent placement at this time for a total 6 month period (minimum 25-month period). pAF thought to be new and an isolated episode, likely not cause of prior CVA. Will avoid changing his current antiplatelet regimen at this time.

## 2022-06-24 NOTE — Progress Notes (Signed)
   CC: f/u HTN, COPD w/asthma, tobacco use disorder  HPI:  [name] is a 72 y.o. male with history listed below presenting to the [facility] for f/u of HTN, COPD w/asthma, tobacco use disorder. Please see individualized problem based charting for full HPI.  Past Medical History:  Diagnosis Date   Atrial tachycardia 04/11/2012   CEREBROVASCULAR ACCIDENT 10/17/2008   Acute CVA of right thalamus 10/03/08 ECHO performed 2/2 CVA 10/09/08, EF 55-65%  Aspirin 325 mg daily and try to work on quitting smoking.    Emphysema    per multiple CXRs   Hypertension    goal < 140/90   Sickle cell trait    Snoring    Sleep study 09/02/10 was WNL   Stroke 10/03/2008   Right thalamus   TIA (transient ischemic attack) 12/25/2021   Tuberculosis at age 25-3    Review of Systems:  Negative aside from that listed in individualized problem based charting.  Physical Exam:  Vitals:   06/24/22 0841 06/24/22 0848 06/24/22 0915  BP: (!) 149/113 (!) 161/88 (!) 141/108  Pulse: 76 62 67  Resp: (!) 28    Temp: 98.1 F (36.7 C)    TempSrc: Oral    SpO2: 100%    Weight: 149 lb (67.6 kg)    Height: 6' (1.829 m)     Physical Exam Constitutional:      Appearance: Normal appearance. He is normal weight. He is not ill-appearing.  HENT:     Mouth/Throat:     Mouth: Mucous membranes are moist.     Pharynx: Oropharynx is clear. No oropharyngeal exudate.  Cardiovascular:     Rate and Rhythm: Normal rate and regular rhythm.     Heart sounds: Normal heart sounds. No murmur heard.    No friction rub. No gallop.  Pulmonary:     Effort: Pulmonary effort is normal. No respiratory distress.     Breath sounds: Normal breath sounds. No wheezing, rhonchi or rales.  Abdominal:     General: Bowel sounds are normal. There is no distension.     Palpations: Abdomen is soft.     Tenderness: There is no abdominal tenderness. There is no guarding or rebound.  Musculoskeletal:        General: No swelling. Normal range of  motion.  Skin:    General: Skin is warm and dry.  Neurological:     General: No focal deficit present.     Mental Status: He is alert and oriented to person, place, and time.  Psychiatric:        Mood and Affect: Mood normal.        Behavior: Behavior normal.      Assessment & Plan:   See Encounters Tab for problem based charting.  Patient discussed with Dr.  Mayford Knife

## 2022-06-26 NOTE — Progress Notes (Signed)
Internal Medicine Clinic Attending  Case discussed with Dr. Jinwala  At the time of the visit.  We reviewed the resident's history and exam and pertinent patient test results.  I agree with the assessment, diagnosis, and plan of care documented in the resident's note.  

## 2022-06-28 ENCOUNTER — Other Ambulatory Visit: Payer: Self-pay | Admitting: Student

## 2022-06-28 DIAGNOSIS — Z8673 Personal history of transient ischemic attack (TIA), and cerebral infarction without residual deficits: Secondary | ICD-10-CM

## 2022-06-28 MED ORDER — TICAGRELOR 90 MG PO TABS: 90.0000 mg | ORAL_TABLET | Freq: Two times a day (BID) | ORAL | 0 refills | Status: DC

## 2022-06-29 ENCOUNTER — Telehealth: Payer: Self-pay

## 2022-06-29 DIAGNOSIS — Z8673 Personal history of transient ischemic attack (TIA), and cerebral infarction without residual deficits: Secondary | ICD-10-CM

## 2022-06-29 MED ORDER — TICAGRELOR 90 MG PO TABS: 90.0000 mg | ORAL_TABLET | Freq: Two times a day (BID) | ORAL | 0 refills | Status: DC

## 2022-06-29 NOTE — Addendum Note (Signed)
Addended by: Gust Rung on: 06/29/2022 03:08 PM   Modules accepted: Orders

## 2022-06-29 NOTE — Telephone Encounter (Signed)
Pt called again in regards to his med  .. I went ahead and  run a PA   .Marland Kitchen But the meds is a covered meds so the price that  he is being told is  for the meds after being ran through on his Insurance.       Message as follows :    Additional Information Required This medication or product is on your plan's list of covered drugs. Prior authorization is not required at this time. If your pharmacy has questions regarding the processing of your prescription, please have them call the [facility] pharmacy help desk at [phone]. **Please note: This request was submitted electronically. Formulary lowering, tiering exception, cost reduction and/or pre-benefit determination review (including prospective Medicare hospice reviews) requests cannot be requested using this method of submission.     Providers contact us at [phone] for further assistance.   Drug Brilinta  tablets

## 2022-06-29 NOTE — Telephone Encounter (Signed)
Done

## 2022-06-29 NOTE — Telephone Encounter (Signed)
Patient called back. States override worked, now he just needs a new Rx sent to [facility].

## 2022-06-29 NOTE — Telephone Encounter (Signed)
Patient called in regarding Brilinta. States he picked up med last week w/o a co-pay. He brought it home but misplaced it. He went back to Pharmacy and was told it would be $400. He is advised to call his insurance co to see if they can do an override. He will call them now.

## 2022-06-29 NOTE — Addendum Note (Signed)
Addended by: Fredderick Severance on: 06/29/2022 03:06 PM   Modules accepted: Orders

## 2022-06-29 NOTE — Telephone Encounter (Signed)
Patient called regarding rx for ticagrelor Marden Noble)  he stated he went to the pharmacy to pick up his medication and the cost of the medication is $400+ which he can't afford, patient is requesting a alternative medication that he can afford to be sent in, patient is requesting a call back when rx has been sent.

## 2022-06-30 ENCOUNTER — Ambulatory Visit: Payer: Medicare HMO

## 2022-07-08 ENCOUNTER — Encounter: Payer: 59 | Admitting: Student

## 2022-07-10 ENCOUNTER — Telehealth: Payer: Self-pay | Admitting: *Deleted

## 2022-07-10 NOTE — Telephone Encounter (Signed)
Called patient / lvm regarding his PCS request/  calling to see if patient is still needing this since he is much better and able to do for self. Waiting for call back.

## 2022-07-19 ENCOUNTER — Other Ambulatory Visit: Payer: Self-pay | Admitting: Student

## 2022-07-20 ENCOUNTER — Other Ambulatory Visit: Payer: Self-pay

## 2022-07-20 DIAGNOSIS — Z8673 Personal history of transient ischemic attack (TIA), and cerebral infarction without residual deficits: Secondary | ICD-10-CM

## 2022-07-21 ENCOUNTER — Telehealth: Payer: Self-pay | Admitting: *Deleted

## 2022-07-21 MED ORDER — TICAGRELOR 90 MG PO TABS
90.0000 mg | ORAL_TABLET | Freq: Two times a day (BID) | ORAL | 0 refills | Status: DC
Start: 2022-07-21 — End: 2022-07-27

## 2022-07-21 NOTE — Telephone Encounter (Signed)
Spoke with [name] regarding is PCS request / patient states he no longer is in need of this service.

## 2022-07-23 ENCOUNTER — Other Ambulatory Visit: Payer: Self-pay

## 2022-07-23 ENCOUNTER — Ambulatory Visit (INDEPENDENT_AMBULATORY_CARE_PROVIDER_SITE_OTHER): Payer: 59

## 2022-07-23 VITALS — BP 135/95 | HR 73 | Temp 97.7°F | Ht 73.0 in | Wt 152.4 lb

## 2022-07-23 DIAGNOSIS — Z139 Encounter for screening, unspecified: Secondary | ICD-10-CM

## 2022-07-23 DIAGNOSIS — Z8673 Personal history of transient ischemic attack (TIA), and cerebral infarction without residual deficits: Secondary | ICD-10-CM

## 2022-07-23 DIAGNOSIS — I10 Essential (primary) hypertension: Secondary | ICD-10-CM

## 2022-07-23 DIAGNOSIS — J029 Acute pharyngitis, unspecified: Secondary | ICD-10-CM

## 2022-07-23 DIAGNOSIS — Z0000 Encounter for general adult medical examination without abnormal findings: Secondary | ICD-10-CM

## 2022-07-23 MED ORDER — FLUTICASONE PROPIONATE 50 MCG/ACT NA SUSP: 1.0000 | Freq: Every day | NASAL | 2 refills | Status: DC

## 2022-07-23 NOTE — Assessment & Plan Note (Signed)
Current medications include amLODipine 5 MG daily and metoprolol tartrate 12.5 mg BID. Patient states that they are compliant with these medications. Patient states that they do check their BP regularly at home but does not remember the values. Patient denies lightheadedness, dizziness, CP, or SOB. Initial BP today is 135/95.   Plan: - Continue amLODipine 5 MG daily and metoprolol tartrate 12.5 mg BID

## 2022-07-23 NOTE — Patient Instructions (Addendum)
Thank you for coming to see [place] in clinic Mr. Cisse.  Plan:  - Please start taking:     - Flonase nasal spray for nasal congestion   - Over the counter neti pot for nasal congestion   - Over the counter chloraseptic spray for sore throat  - We referred you to see the following doctors (you will be called to schedule an appointment):     - Gastroenterology (stomach doctor) for colonoscopy   Return Precautions: If you develop worsening cough, mucus production, shortness of breath, difficulty breathing, please call our clinic and visit the emergency department as these can be signs of COPD exacerbation.    It was very nice to see you, thank you for allowing [place] to be involved in your care. We look forward to seeing you next time. Please call our clinic at [phone] if you have any questions or concerns. The best time to call is Monday-Friday from 9am-4pm, but there is someone available 24/7. If after hours or the weekend, call the main hospital number at [phone] and ask for the Internal Medicine Resident On-Call. If you need medication refills, please notify your pharmacy one week in advance and they will send [place] a request.   Please make sure to arrive 15 minutes prior to your next appointment. If you arrive late, you may be asked to reschedule.

## 2022-07-23 NOTE — Assessment & Plan Note (Signed)
Patient presents w/ sore throat for the last 6 days. Patient also reports cough productive of dark yellow sputum, nasal congestion, and mild generalized headache.   Patient denies fever, chills, facial pain, muscle aches, n/v/d, abdominal pain, dizziness, or lightheadedness. Patient reports they are eating and drinking appropriately. They have tried mucinex at home with modest relief of their cough but no improvement of his sore throat. Patient denies recent sick contacts.    Patient does have a history of COPD. Current medications include albuterol inhaler and incruse ellipta inhaler. Patient does not report increased use of these inhalers over this period of time. Denies difficulty breathing. Patient does report chronic nasal congestion that worsens w/ fans, air conditioning, and cold air but denies sneezing or history of allergies.    On exam, has erythema of the posterior oropharynx and anterior cervical lymphadenopathy. Low risk for strep throat based on CENTOR criteria. Low suspicion for COPD exacerbation at this time given lack of dyspnea, hypoxia, and improving cough. Suspect patients symptoms are likely secondary to viral URI. Out of the testing window for influenza/COVID therefore will not test for these. Will treat conservatively. Patient may also have an allergic component given his chronic nasal congestion. Will try starting fluticasone as well.   Plan: - Continue albuterol inhaler and incruse ellipta inhaler - OTC Neti pot, chloraseptic spray - Fluticasone nasal spray

## 2022-07-23 NOTE — Assessment & Plan Note (Signed)
Patient has a history of CVA in 04/2022 (Right MCA stroke due to right M1 occlusion s/p thrombectomy, angioplasty, and stent) w/o residual deficits. Current medications include aspirin EC 81 MG daily, atorvastatin 80 MG daily, ezetimibe 10 MG daily, ticagrelor 90 MG BID. Patient states that they are compliant with these medications. Denies new weakness, numbness, tingling, or difficulty speaking. Lipid panel from 3 months ago WNL w/ exception of HDL 32, LDL 82. Patient follows w/ neurology, last seen in 04/2022 w/ plan to continue aspirin/Brillinta and to f/u in 6 months.  Plan: - Continue aspirin EC 81 MG daily, atorvastatin 80 MG daily, ezetimibe 10 MG daily, ticagrelor 90 MG BID - Repeat lipid panel in 9 months - Continue f/u w/ neurology

## 2022-07-23 NOTE — Progress Notes (Signed)
CC: sore throat  HPI:  [name] is a 72 y.o. male with past medical history of HTN, HLD, CVA (04/2022), paroxysmal a-fib, COPD, GERD, DDD, OA that presents for sore throat.    Allergies as of 07/23/2022       Reactions   Ace Inhibitors Swelling, Other (See Comments)   Patient presented with right upper lip swelling along with tingling and numbness. Possible allergic reaction to ACEI.        Medication List        Accurate as of Jul 23, 2022 10:03 AM. If you have any questions, ask your nurse or doctor.          albuterol 108 (90 Base) MCG/ACT inhaler Commonly known as: VENTOLIN HFA INHALE 2 PUFFS BY MOUTH EVERY 6 HOURS AS NEEDED FOR WHEEZING OR SHORTNESS OF BREATH   amLODipine 5 MG tablet Commonly known as: NORVASC Take 1 tablet (5 mg total) by mouth daily.   aspirin EC 81 MG tablet Take 1 tablet (81 mg total) by mouth daily. Swallow whole.   atorvastatin 80 MG tablet Commonly known as: LIPITOR Take 1 tablet (80 mg total) by mouth daily.   cetirizine 10 MG tablet Commonly known as: ZyrTEC Allergy Take 1 tablet (10 mg total) by mouth daily.   ezetimibe 10 MG tablet Commonly known as: ZETIA Take 1 tablet by mouth once daily   fluticasone 50 MCG/ACT nasal spray Commonly known as: FLONASE Place 1 spray into both nostrils daily. Started by: Karoline Caldwell, MD   Incruse Ellipta 62.5 MCG/ACT Aepb Generic drug: umeclidinium bromide Inhale 1 puff into the lungs daily.   metoprolol tartrate 25 MG tablet Commonly known as: LOPRESSOR Take 0.5 tablets (12.5 mg total) by mouth 2 (two) times daily.   nicotine 21 mg/24hr patch Commonly known as: NICODERM CQ - dosed in mg/24 hours Place 1 patch (21 mg total) onto the skin daily.   nicotine polacrilex 4 MG gum Commonly known as: Nicorette Take 1 each (4 mg total) by mouth as needed for smoking cessation.   senna-docusate 8.6-50 MG tablet Commonly known as: Senokot-S Take 1 tablet by mouth at bedtime.    ticagrelor 90 MG Tabs tablet Commonly known as: BRILINTA Take 1 tablet (90 mg total) by mouth 2 (two) times daily.         Past Medical History:  Diagnosis Date   Atrial tachycardia 04/11/2012   Bilateral flank pain 06/26/2020   CEREBROVASCULAR ACCIDENT 10/17/2008   Acute CVA of right thalamus 10/03/08 ECHO performed 2/2 CVA 10/09/08, EF 55-65%  Aspirin 325 mg daily and try to work on quitting smoking.    Emphysema    per multiple CXRs   Hypertension    goal < 140/90   Sickle cell trait (HCC)    Snoring    Sleep study 09/02/10 was WNL   Stroke [facility]) 10/03/2008   Right thalamus   TIA (transient ischemic attack) 12/25/2021   Tuberculosis at age 1-3   Review of Systems:  per HPI.   Physical Exam: Vitals:   07/23/22 0844  BP: (!) 135/95  Pulse: 73  Temp: 97.7 F (36.5 C)  TempSrc: Oral  SpO2: 100%  Weight: 152 lb 6.4 oz (69.1 kg)  Height: 6\' 1"  (1.854 m)   Constitutional: Well-developed, well-nourished, appears comfortable  HENT: Normocephalic and atraumatic. Anterior cervical lymphadenopathy bilaterally. Mild erythema of the posterior oropharynx bilaterally. No tonsillar exudates. Normal TM bilaterally. Minimal erythema of the nares bilaterally. No facial tenderness on palpation.  Eyes: EOM are normal. PERRL.  Neck: Normal range of motion.  Cardiovascular: Regular rate, regular rhythm. No murmurs, rubs, or gallops. Normal radial and PT pulses bilaterally. No LE edema.  Pulmonary: Normal respiratory effort. No wheezes or crackles.  Abdominal: Soft. Non-distended. No tenderness. Normal bowel sounds.  Musculoskeletal: Normal range of motion.     Neurological: Alert and oriented to person, place, and time. Non-focal. Skin: warm and dry.    Assessment & Plan:   See Encounters Tab for problem based charting.  Sore throat Patient presents w/ sore throat for the last 6 days. Patient also reports cough productive of dark yellow sputum, nasal congestion, and mild generalized  headache.   Patient denies fever, chills, facial pain, muscle aches, n/v/d, abdominal pain, dizziness, or lightheadedness. Patient reports they are eating and drinking appropriately. They have tried mucinex at home with modest relief of their cough but no improvement of his sore throat. Patient denies recent sick contacts.    Patient does have a history of COPD. Current medications include albuterol inhaler and incruse ellipta inhaler. Patient does not report increased use of these inhalers over this period of time. Denies difficulty breathing. Patient does report chronic nasal congestion that worsens w/ fans, air conditioning, and cold air but denies sneezing or history of allergies.    On exam, has erythema of the posterior oropharynx and anterior cervical lymphadenopathy. Low risk for strep throat based on CENTOR criteria. Low suspicion for COPD exacerbation at this time given lack of dyspnea, hypoxia, and improving cough. Suspect patients symptoms are likely secondary to viral URI. Out of the testing window for influenza/COVID therefore will not test for these. Will treat conservatively. Patient may also have an allergic component given his chronic nasal congestion. Will try starting fluticasone as well.   Plan: - Continue albuterol inhaler and incruse ellipta inhaler - OTC Neti pot, chloraseptic spray - Fluticasone nasal spray  History of CVA (cerebrovascular accident) Patient has a history of CVA in 04/2022 (Right MCA stroke due to right M1 occlusion s/p thrombectomy, angioplasty, and stent) w/o residual deficits. Current medications include aspirin EC 81 MG daily, atorvastatin 80 MG daily, ezetimibe 10 MG daily, ticagrelor 90 MG BID. Patient states that they are compliant with these medications. Denies new weakness, numbness, tingling, or difficulty speaking. Lipid panel from 3 months ago WNL w/ exception of HDL 32, LDL 82. Patient follows w/ neurology, last seen in 04/2022 w/ plan to continue  aspirin/Brillinta and to f/u in 6 months.  Plan: - Continue aspirin EC 81 MG daily, atorvastatin 80 MG daily, ezetimibe 10 MG daily, ticagrelor 90 MG BID - Repeat lipid panel in 9 months - Continue f/u w/ neurology  Essential hypertension Current medications include amLODipine 5 MG daily and metoprolol tartrate 12.5 mg BID. Patient states that they are compliant with these medications. Patient states that they do check their BP regularly at home but does not remember the values. Patient denies lightheadedness, dizziness, CP, or SOB. Initial BP today is 135/95.   Plan: - Continue amLODipine 5 MG daily and metoprolol tartrate 12.5 mg BID  Healthcare maintenance Ordered referral for colonoscopy.     Patient seen with Dr. Sol Blazing.

## 2022-07-23 NOTE — Assessment & Plan Note (Signed)
Ordered referral for colonoscopy.  

## 2022-07-24 NOTE — Progress Notes (Signed)
Internal Medicine Clinic Attending  Case discussed with Dr. Mapp  At the time of the visit.  We reviewed the resident's history and exam and pertinent patient test results.  I agree with the assessment, diagnosis, and plan of care documented in the resident's note.  

## 2022-07-27 ENCOUNTER — Other Ambulatory Visit: Payer: Self-pay

## 2022-07-27 DIAGNOSIS — Z8673 Personal history of transient ischemic attack (TIA), and cerebral infarction without residual deficits: Secondary | ICD-10-CM

## 2022-07-28 MED ORDER — TICAGRELOR 90 MG PO TABS: 90.0000 mg | ORAL_TABLET | Freq: Two times a day (BID) | ORAL | 0 refills | Status: DC

## 2022-08-05 ENCOUNTER — Other Ambulatory Visit: Payer: Self-pay

## 2022-08-05 ENCOUNTER — Ambulatory Visit (INDEPENDENT_AMBULATORY_CARE_PROVIDER_SITE_OTHER): Payer: 59 | Admitting: Student

## 2022-08-05 ENCOUNTER — Encounter: Payer: Self-pay | Admitting: Student

## 2022-08-05 ENCOUNTER — Other Ambulatory Visit (HOSPITAL_COMMUNITY)
Admission: RE | Admit: 2022-08-05 | Discharge: 2022-08-05 | Disposition: A | Discharge status: Home / Self Care | Payer: 59 | Source: Ambulatory Visit | Attending: Internal Medicine | Admitting: Internal Medicine

## 2022-08-05 VITALS — BP 153/115 | HR 63 | Temp 98.4°F | Ht 73.0 in | Wt 151.0 lb

## 2022-08-05 DIAGNOSIS — A599 Trichomoniasis, unspecified: Secondary | ICD-10-CM

## 2022-08-05 DIAGNOSIS — Z7251 High risk heterosexual behavior: Secondary | ICD-10-CM | POA: Diagnosis present

## 2022-08-05 NOTE — Progress Notes (Signed)
   CC: penile discharge after recent sexual encounter  HPI:  [name] is a 71 y.o. M with PMH per below who presents for penile discharge after recent sexual encounter. Patient states that he had unprotected sex about 2.5 to 3 weeks ago. He states that about a week ago he began having a yellow discharge from his penis and itching of the head and shaft of the penis. He denies any hematuria or ulcerations of the penis. He also denies fevers.   Past Medical History:  Diagnosis Date   Atrial tachycardia 04/11/2012   Bilateral flank pain 06/26/2020   CEREBROVASCULAR ACCIDENT 10/17/2008   Acute CVA of right thalamus 10/03/08 ECHO performed 2/2 CVA 10/09/08, EF 55-65%  Aspirin 325 mg daily and try to work on quitting smoking.    Emphysema    per multiple CXRs   Hypertension    goal < 140/90   Sickle cell trait (HCC)    Snoring    Sleep study 09/02/10 was WNL   Stroke [facility]) 10/03/2008   Right thalamus   TIA (transient ischemic attack) 12/25/2021   Tuberculosis at age 70-3   Review of Systems:  Please see problem based charting under encounters tab for further details.    Physical Exam:  Vitals:   08/05/22 0907 08/05/22 0953  BP: (!) 157/110 (!) 153/115  Pulse: 82 63  Temp: 98.4 F (36.9 C)   TempSrc: Oral   SpO2: 100%   Weight: 151 lb (68.5 kg)   Height: 6\' 1"  (1.854 m)    Physical Exam  Constitutional: Well-developed, well-nourished, and in no distress.  HENT:  Head: Normocephalic and atraumatic.  Eyes: EOM are normal.  Neck: Normal range of motion.  Cardiovascular: Normal rate, regular rhythm, intact distal pulses. No gallop and no friction rub.  No murmur heard. No lower extremity edema  Pulmonary: Non labored breathing on room air, no wheezing or rales  Abdominal: Soft. Normal bowel sounds. Non distended and non tender Musculoskeletal: Normal range of motion.        General: No tenderness or edema.  Neurological: Alert and oriented to person, place, and time.  Non focal  Skin: Skin is warm and dry.  GU: No ulcerations or erythema of the penis, no visible discharge at the urethral meatus.    Assessment & Plan:   See Encounters Tab for problem based charting.  Patient discussed with Dr. Heide Spark

## 2022-08-05 NOTE — Assessment & Plan Note (Signed)
Obtained STI testing this clinic visit. Counseled patient on using barrier protection and also to inform his partner. Discussed that when his results return we will treat him appropriately. He is in agreement with this plan.   Addendum: Patient tested positive for trichomonas. Called patient 08/06/2022 with no answer. Will attempt again and send prescription for metronidazole.

## 2022-08-05 NOTE — Patient Outreach (Signed)
First telephone outreach attempt to obtain mRS. No answer. Left message for returned call.  Marylynne Keelin [facility]-Care Management Assistant [phone]  

## 2022-08-05 NOTE — Addendum Note (Signed)
Addended by: Bufford Spikes on: 08/05/2022 04:49 PM   Modules accepted: Orders

## 2022-08-05 NOTE — Patient Instructions (Addendum)
Please inform your partners that they should be tested for STIs. We will call you with the results and treat you as needed.

## 2022-08-06 ENCOUNTER — Other Ambulatory Visit: Payer: Self-pay

## 2022-08-06 ENCOUNTER — Ambulatory Visit: Payer: 59

## 2022-08-06 LAB — HIV ANTIBODY (ROUTINE TESTING W REFLEX): HIV Screen 4th Generation wRfx: NONREACTIVE

## 2022-08-06 LAB — URINE CYTOLOGY ANCILLARY ONLY
Chlamydia: NEGATIVE
Comment: NEGATIVE
Comment: NEGATIVE
Comment: NORMAL
Neisseria Gonorrhea: NEGATIVE
Trichomonas: POSITIVE — AB

## 2022-08-06 LAB — RPR: RPR Ser Ql: NONREACTIVE

## 2022-08-06 MED ORDER — METRONIDAZOLE 500 MG PO TABS
2000.0000 mg | ORAL_TABLET | Freq: Once | ORAL | 0 refills | Status: AC
Start: 2022-08-06 — End: 2022-08-06

## 2022-08-06 NOTE — Addendum Note (Signed)
Addended by: Michelle Piper on: 08/06/2022 04:35 PM   Modules accepted: Orders

## 2022-08-06 NOTE — Patient Outreach (Signed)
Second telephone outreach attempt to obtain mRS. No answer. Left message for returned call.  Luetta Piazza [facility]-Care Management Assistant [phone]  

## 2022-08-07 ENCOUNTER — Other Ambulatory Visit: Payer: Self-pay | Admitting: Student

## 2022-08-07 ENCOUNTER — Other Ambulatory Visit: Payer: Self-pay

## 2022-08-07 DIAGNOSIS — A599 Trichomoniasis, unspecified: Secondary | ICD-10-CM

## 2022-08-07 MED ORDER — METRONIDAZOLE 500 MG PO TABS
2000.0000 mg | ORAL_TABLET | Freq: Once | ORAL | 0 refills | Status: AC
Start: 2022-08-07 — End: 2022-08-07

## 2022-08-07 NOTE — Patient Outreach (Signed)
Telephone outreach to patient to obtain mRS was successfully completed. MRS= 0  [name] [facility] Care Management Assistant [phone]  

## 2022-08-07 NOTE — Progress Notes (Signed)
Discussed with patient to avoid sexual intercourse until his symptoms completely resolve. He will also alert his sexual partner that they need to be tested and treated.   Sent prescription for one time dose of 2g of flagyl to treat his infection.

## 2022-08-10 NOTE — Progress Notes (Signed)
Internal Medicine Clinic Attending  Case discussed with Dr. Carter  At the time of the visit.  We reviewed the resident's history and exam and pertinent patient test results.  I agree with the assessment, diagnosis, and plan of care documented in the resident's note.  

## 2022-08-16 ENCOUNTER — Encounter (HOSPITAL_COMMUNITY): Payer: Self-pay | Admitting: Emergency Medicine

## 2022-08-16 ENCOUNTER — Emergency Department (HOSPITAL_COMMUNITY)
Admission: EM | Admit: 2022-08-16 | Discharge: 2022-08-16 | Disposition: A | Discharge status: Home / Self Care | Payer: 59 | Attending: Emergency Medicine | Admitting: Emergency Medicine

## 2022-08-16 ENCOUNTER — Emergency Department (HOSPITAL_COMMUNITY): Payer: 59

## 2022-08-16 ENCOUNTER — Other Ambulatory Visit: Payer: Self-pay

## 2022-08-16 DIAGNOSIS — Z7982 Long term (current) use of aspirin: Secondary | ICD-10-CM | POA: Diagnosis not present

## 2022-08-16 DIAGNOSIS — R519 Headache, unspecified: Secondary | ICD-10-CM | POA: Diagnosis present

## 2022-08-16 NOTE — Discharge Instructions (Signed)
Return for any problem.  ?

## 2022-08-16 NOTE — ED Provider Notes (Signed)
[place] EMERGENCY DEPARTMENT AT [facility] Provider Note   CSN: [id number] Arrival date & time: 08/16/22  1415     History  Chief Complaint  Patient presents with   Headache    [name] is a 72 y.o. male.  72 year old male with prior medical history as detailed below presents for evaluation.  Patient reports that while he was at church she developed a headache.  Patient reports that this happens when he is in an air conditioned room.  Patient with history of stroke and stent placement in brain.  Patient requests CT Head.   The history is provided by medical records.       Home Medications Prior to Admission medications   Medication Sig Start Date End Date Taking? Authorizing Provider  albuterol (VENTOLIN HFA) 108 (90 Base) MCG/ACT inhaler INHALE 2 PUFFS BY MOUTH EVERY 6 HOURS AS NEEDED FOR WHEEZING OR SHORTNESS OF BREATH 05/06/22   Gust Rung, DO  amLODipine (NORVASC) 5 MG tablet Take 1 tablet (5 mg total) by mouth daily. 06/24/22 06/24/23  Merrilyn Puma, MD  aspirin EC 81 MG tablet Take 1 tablet (81 mg total) by mouth daily. Swallow whole. 04/26/22   de Saintclair Halsted, Cortney E, NP  atorvastatin (LIPITOR) 80 MG tablet Take 1 tablet (80 mg total) by mouth daily. 05/07/22   Gust Rung, DO  cetirizine (ZYRTEC ALLERGY) 10 MG tablet Take 1 tablet (10 mg total) by mouth daily. 01/15/22 01/15/23  Merrilyn Puma, MD  ezetimibe (ZETIA) 10 MG tablet Take 1 tablet by mouth once daily 07/20/22   Gust Rung, DO  fluticasone (FLONASE) 50 MCG/ACT nasal spray Place 1 spray into both nostrils daily. 07/23/22 07/23/23  Mapp, Gaylyn Cheers, MD  metoprolol tartrate (LOPRESSOR) 25 MG tablet Take 0.5 tablets (12.5 mg total) by mouth 2 (two) times daily. 06/19/22   Mealor, Roberts Gaudy, MD  nicotine (NICODERM CQ - DOSED IN MG/24 HOURS) 21 mg/24hr patch Place 1 patch (21 mg total) onto the skin daily. 06/24/22   Merrilyn Puma, MD  nicotine polacrilex (NICORETTE) 4 MG gum Take 1 each (4  mg total) by mouth as needed for smoking cessation. 06/24/22   Merrilyn Puma, MD  senna-docusate (SENOKOT-S) 8.6-50 MG tablet Take 1 tablet by mouth at bedtime. 05/14/22   Merrilee Jansky, MD  ticagrelor (BRILINTA) 90 MG TABS tablet Take 1 tablet (90 mg total) by mouth 2 (two) times daily. 07/28/22   Gust Rung, DO  umeclidinium bromide (INCRUSE ELLIPTA) 62.5 MCG/ACT AEPB Inhale 1 puff into the lungs daily. 06/24/22   Merrilyn Puma, MD      Allergies    Ace inhibitors    Review of Systems   Review of Systems  All other systems reviewed and are negative.   Physical Exam Updated Vital Signs BP (!) 174/117   Pulse 73   Temp (!) 97.5 F (36.4 C) (Oral)   Resp 16   Ht 6\' 1"  (1.854 m)   Wt 68.5 kg   SpO2 100%   BMI 19.92 kg/m  Physical Exam Vitals and nursing note reviewed.  Constitutional:      General: He is not in acute distress.    Appearance: Normal appearance. He is well-developed.  HENT:     Head: Normocephalic and atraumatic.  Eyes:     Conjunctiva/sclera: Conjunctivae normal.     Pupils: Pupils are equal, round, and reactive to light.  Cardiovascular:     Rate and Rhythm: Normal rate and regular  rhythm.     Heart sounds: Normal heart sounds.  Pulmonary:     Effort: Pulmonary effort is normal. No respiratory distress.     Breath sounds: Normal breath sounds.  Abdominal:     General: There is no distension.     Palpations: Abdomen is soft.     Tenderness: There is no abdominal tenderness.  Musculoskeletal:        General: No deformity. Normal range of motion.     Cervical back: Normal range of motion and neck supple.  Skin:    General: Skin is warm and dry.  Neurological:     General: No focal deficit present.     Mental Status: He is alert and oriented to person, place, and time.     GCS: GCS eye subscore is 4. GCS verbal subscore is 5. GCS motor subscore is 6.     Cranial Nerves: No cranial nerve deficit or dysarthria.     Sensory: No sensory deficit.      Motor: No weakness.     ED Results / Procedures / Treatments   Labs (all labs ordered are listed, but only abnormal results are displayed) Labs Reviewed - No data to display  EKG None  Radiology CT Head Wo Contrast  Result Date: 08/16/2022 CLINICAL DATA:  Headache, new onset (Age >= 51y) EXAM: CT HEAD WITHOUT CONTRAST TECHNIQUE: Contiguous axial images were obtained from the base of the skull through the vertex without intravenous contrast. RADIATION DOSE REDUCTION: This exam was performed according to the departmental dose-optimization program which includes automated exposure control, adjustment of the mA and/or kV according to patient size and/or use of iterative reconstruction technique. COMPARISON:  Head CT 05/18/2022 FINDINGS: Brain: No evidence of acute infarction, hemorrhage, hydrocephalus, extra-axial collection or mass lesion/mass effect. Normal brain volume for age. Remote lacunar infarcts in the right basal ganglia and subinsular aeration with stable ex vacuo dilatation of the right lateral ventricle. Vascular: Stent in the right MCA stable positioning. No hyperdense vessel. Skull: Normal. Negative for fracture or focal lesion. Sinuses/Orbits: No acute finding. Other: None. IMPRESSION: 1. No acute intracranial abnormality. 2. Remote lacunar infarcts in the right basal ganglia and subinsular aeration. Stent in the right MCA stable positioning. Electronically Signed   By: Narda Rutherford M.D.   On: 08/16/2022 15:29    Procedures Procedures    Medications Ordered in ED Medications - No data to display  ED Course/ Medical Decision Making/ A&P                             Medical Decision Making   Medical Screen Complete  This patient presented to the ED with complaint of headache.  This complaint involves an extensive number of treatment options. The initial differential diagnosis includes, but is not limited to, intracranial pathology  This presentation is: Chronic,  Self-Limited, Previously Undiagnosed, Uncertain Prognosis, Complicated, Systemic Symptoms, and Threat to Life/Bodily Function   Patient is presenting with complaint of mild headache.  Symptoms have completely resolved.  Patient with history of stroke.  Patient requesting CT head.  CT imaging obtained which is without significant acute pathology.  Patient reassured by ED evaluation.  He desires discharge.  Importance of close follow-up is stressed.  Strict return precautions given and understood.    Additional history obtained: External records from outside sources obtained and reviewed including prior ED visits and prior Inpatient records.    Lab Tests:  I ordered  and personally interpreted labs.   Imaging Studies ordered:  I ordered imaging studies including CT head  I independently visualized and interpreted obtained imaging which showed NAD I agree with the radiologist interpretation.   Cardiac Monitoring:  The patient was maintained on a cardiac monitor.  I personally viewed and interpreted the cardiac monitor which showed an underlying rhythm of: NSR  Problem List / ED Course:  Headache   Reevaluation:  After the interventions noted above, I reevaluated the patient and found that they have: improved  Disposition:  After consideration of the diagnostic results and the patients response to treatment, I feel that the patent would benefit from close outpatient followup.          Final Clinical Impression(s) / ED Diagnoses Final diagnoses:  Acute nonintractable headache, unspecified headache type    Rx / DC Orders ED Discharge Orders     None         Wynetta Fines, MD 08/16/22 2053

## 2022-08-16 NOTE — ED Provider Triage Note (Signed)
Emergency Medicine Provider Triage Evaluation Note  [name] , a 72 y.o. male  was evaluated in triage.  Pt complains of headache. Same began earlier today when he was at church. States that this happens sometimes when he is in an air conditioned room since he had a stent placed in his brain. He left the room and his headache resolved, however other church members called EMS and told him to go to the hospital for evaluation. He is completely asymptomatic. Headache is consistent with his baseline. No vision changes or focal deficits. Ambulatory  Review of Systems  Positive:  Negative:   Physical Exam  BP (!) 174/117   Pulse 73   Temp (!) 97.5 F (36.4 C) (Oral)   Resp 16   Ht 6\' 1"  (1.854 m)   Wt 68.5 kg   SpO2 100%   BMI 19.92 kg/m  Gen:   Awake, no distress   Resp:  Normal effort  MSK:   Moves extremities without difficulty  Other:  No focal deficits  Medical Decision Making  Medically screening exam initiated at 2:38 PM.  Appropriate orders placed.  IANMICHAEL AMESCUA was informed that the remainder of the evaluation will be completed by another provider, this initial triage assessment does not replace that evaluation, and the importance of remaining in the ED until their evaluation is complete.  Patient refusing blood work, only wants head CT. Ordered.    Silva Bandy, PA-C 08/16/22 1442

## 2022-08-16 NOTE — ED Triage Notes (Signed)
Per [facility] pt coming from church- c/o pain to right side of head pain that started while sitting in church. Patient states he has a stent placed that sometimes causes pain when his sinuses get congested. Patient currently not having any pain and feels back to normal.

## 2022-08-16 NOTE — ED Notes (Signed)
This RN reviewed discharge instructions with patient. He verbalized understanding and denied any further questions. PT well appearing upon discharge and reports tolerable pain. Pt ambulated with stable gait to exit. Pt endorses ride home.  

## 2022-08-17 ENCOUNTER — Ambulatory Visit (INDEPENDENT_AMBULATORY_CARE_PROVIDER_SITE_OTHER): Payer: 59

## 2022-08-17 DIAGNOSIS — I495 Sick sinus syndrome: Secondary | ICD-10-CM | POA: Diagnosis not present

## 2022-08-18 LAB — CUP PACEART REMOTE DEVICE CHECK
Battery Impedance: 2840 Ohm
Battery Remaining Longevity: 26 mo
Battery Voltage: 2.74 V
Brady Statistic AP VP Percent: 0 %
Brady Statistic AP VS Percent: 23 %
Brady Statistic AS VP Percent: 1 %
Brady Statistic AS VS Percent: 76 %
Date Time Interrogation Session: 20240608171749
Implantable Lead Connection Status: 753985
Implantable Lead Connection Status: 753985
Implantable Lead Implant Date: 20101105
Implantable Lead Implant Date: 20101105
Implantable Lead Location: 753859
Implantable Lead Location: 753860
Implantable Lead Model: 5076
Implantable Lead Model: 5076
Implantable Pulse Generator Implant Date: 20101105
Lead Channel Impedance Value: 447 Ohm
Lead Channel Impedance Value: 471 Ohm
Lead Channel Pacing Threshold Amplitude: 0.75 V
Lead Channel Pacing Threshold Amplitude: 1.125 V
Lead Channel Pacing Threshold Pulse Width: 0.4 ms
Lead Channel Pacing Threshold Pulse Width: 0.4 ms
Lead Channel Setting Pacing Amplitude: 2 V
Lead Channel Setting Pacing Amplitude: 2.5 V
Lead Channel Setting Pacing Pulse Width: 0.4 ms
Lead Channel Setting Sensing Sensitivity: 2.8 mV
Zone Setting Status: 755011
Zone Setting Status: 755011

## 2022-09-07 NOTE — Progress Notes (Signed)
Remote pacemaker transmission.   

## 2022-09-24 ENCOUNTER — Other Ambulatory Visit: Payer: Self-pay

## 2022-09-24 ENCOUNTER — Other Ambulatory Visit (HOSPITAL_COMMUNITY)
Admission: RE | Admit: 2022-09-24 | Discharge: 2022-09-24 | Disposition: A | Discharge status: Home / Self Care | Payer: 59 | Source: Ambulatory Visit | Attending: Internal Medicine | Admitting: Internal Medicine

## 2022-09-24 ENCOUNTER — Ambulatory Visit (INDEPENDENT_AMBULATORY_CARE_PROVIDER_SITE_OTHER): Payer: 59 | Admitting: Internal Medicine

## 2022-09-24 ENCOUNTER — Encounter: Payer: Self-pay | Admitting: Internal Medicine

## 2022-09-24 VITALS — BP 153/117 | HR 61 | Temp 97.7°F | Ht 73.0 in | Wt 150.0 lb

## 2022-09-24 DIAGNOSIS — A5903 Trichomonal cystitis and urethritis: Secondary | ICD-10-CM | POA: Insufficient documentation

## 2022-09-24 DIAGNOSIS — Z8673 Personal history of transient ischemic attack (TIA), and cerebral infarction without residual deficits: Secondary | ICD-10-CM

## 2022-09-24 DIAGNOSIS — J4489 Other specified chronic obstructive pulmonary disease: Secondary | ICD-10-CM

## 2022-09-24 DIAGNOSIS — F17210 Nicotine dependence, cigarettes, uncomplicated: Secondary | ICD-10-CM

## 2022-09-24 DIAGNOSIS — I10 Essential (primary) hypertension: Secondary | ICD-10-CM | POA: Diagnosis not present

## 2022-09-24 MED ORDER — INCRUSE ELLIPTA 62.5 MCG/ACT IN AEPB: 1.0000 | INHALATION_SPRAY | Freq: Every day | RESPIRATORY_TRACT | 2 refills | Status: DC

## 2022-09-24 MED ORDER — AMLODIPINE BESYLATE 5 MG PO TABS
5.0000 mg | ORAL_TABLET | Freq: Every day | ORAL | 3 refills | Status: DC
Start: 2022-09-24 — End: 2022-11-16

## 2022-09-24 MED ORDER — METOPROLOL SUCCINATE ER 50 MG PO TB24
50.0000 mg | ORAL_TABLET | Freq: Every day | ORAL | 3 refills | Status: DC
Start: 2022-09-24 — End: 2022-11-16

## 2022-09-24 NOTE — Patient Instructions (Addendum)
This is your GI doctor's information to call about the colonoscopy.  [facility]: Anselmo Rod MD Gastroenterologist in [place], [place] Address: [address], [place], [place] [address]   Phone: [phone]  Pick up your blood pressure medications and take them every day for the next month.  Dr Rosita Fire was the interventional radiologist that placed the stent in your head in the hospital.  I have placed a referral for them to see you again.

## 2022-09-25 LAB — URINE CYTOLOGY ANCILLARY ONLY
Chlamydia: NEGATIVE
Comment: NEGATIVE
Comment: NEGATIVE
Comment: NORMAL
Neisseria Gonorrhea: NEGATIVE
Trichomonas: NEGATIVE

## 2022-09-25 NOTE — Progress Notes (Signed)
Established Patient Office Visit  Subjective   Patient ID: [name], male    DOB: 1950-08-18  Age: 72 y.o. MRN: [id number]  Chief Complaint  Patient presents with   Follow-up   Deveron is here for follow-up.  He had a stroke back in February and also had an intracranial stent placed.  He was started on secondary prevention and dual antiplatelet therapy.  He is a little mixed up about his antihypertensives thinking that he was told to stop blood pressure medications but he has been taking Brilinta and aspirin every day.  Appears somewhat unsure about cholesterol medication.  He had a trichomoniasis infection back in May he has no urethritis symptoms at this time.     Objective:     BP (!) 153/117 (BP Location: Right Arm, Patient Position: Sitting, Cuff Size: Normal)   Pulse 61   Temp 97.7 F (36.5 C) (Oral)   Ht 6\' 1"  (1.854 m)   Wt 150 lb (68 kg)   SpO2 100% Comment: RA  BMI 19.79 kg/m  BP Readings from Last 3 Encounters:  09/24/22 (!) 153/117  08/16/22 (!) 174/117  08/05/22 (!) 153/115   Wt Readings from Last 3 Encounters:  09/24/22 150 lb (68 kg)  08/16/22 151 lb (68.5 kg)  08/05/22 151 lb (68.5 kg)      Physical Exam Constitutional:      Appearance: Normal appearance. He is obese.  Cardiovascular:     Rate and Rhythm: Normal rate and regular rhythm.  Pulmonary:     Effort: Pulmonary effort is normal.     Breath sounds: Normal breath sounds.  Neurological:     Mental Status: He is alert.  Psychiatric:        Mood and Affect: Mood normal.      Results for orders placed or performed in visit on 09/24/22  Urine cytology ancillary only  Result Value Ref Range   Neisseria Gonorrhea Negative    Chlamydia Negative    Trichomonas Negative    Comment Normal Reference Range Trichomonas - Negative    Comment Normal Reference Ranger Chlamydia - Negative    Comment      Normal Reference Range Neisseria Gonorrhea - Negative    Last CBC Lab Results   Component Value Date   WBC 6.3 04/25/2022   HGB 9.9 (L) 04/25/2022   HCT 27.2 (L) 04/25/2022   MCV 84.2 04/25/2022   MCH 30.7 04/25/2022   RDW 14.8 04/25/2022   PLT 183 04/25/2022   Last metabolic panel Lab Results  Component Value Date   GLUCOSE 105 (H) 04/25/2022   NA 138 04/25/2022   K 3.2 (L) 04/25/2022   CL 107 04/25/2022   CO2 24 04/25/2022   BUN <5 (L) 04/25/2022   CREATININE 0.83 04/25/2022   GFRNONAA >60 04/25/2022   CALCIUM 8.3 (L) 04/25/2022   PHOS 2.0 (L) 04/25/2022   PROT 7.4 04/23/2022   ALBUMIN 3.8 04/23/2022   LABGLOB 2.4 12/25/2021   AGRATIO 1.8 12/25/2021   BILITOT 0.5 04/23/2022   ALKPHOS 110 04/23/2022   AST 31 04/23/2022   ALT 28 04/23/2022   ANIONGAP 7 04/25/2022   Last hemoglobin A1c Lab Results  Component Value Date   HGBA1C 5.3 04/07/2022      The ASCVD Risk score (Arnett DK, et al., 2019) failed to calculate for the following reasons:   The patient has a prior MI or stroke diagnosis    Assessment & Plan:   Problem List Items Addressed  This Visit       Cardiovascular and Mediastinum   Essential hypertension (Chronic)    Stressed importance of taking antihypertensive medications and went over these in detail restart amlodipine I have also increased his metoprolol to 50 mg of Toprol XL for simplicity daily.      Relevant Medications   amLODipine (NORVASC) 5 MG tablet   metoprolol succinate (TOPROL-XL) 50 MG 24 hr tablet     Respiratory   COPD with asthma (HCC) (Chronic)    Notes that he has been taking albuterol and Incruse just as needed.  Discussed taking Incruse daily and albuterol as needed.      Relevant Medications   umeclidinium bromide (INCRUSE ELLIPTA) 62.5 MCG/ACT AEPB     Other   History of CVA (cerebrovascular accident) - Primary (Chronic)    Had right MCA stenting in February initial plan was 3 months of dual antiplatelet with aspirin and Brilinta then pending interventional radiology recommendation.  He has not  been back to see interventional radiology I will place a referral to get their input.      Relevant Orders   Ambulatory referral to Interventional Radiology   Other Visit Diagnoses     Trichomonal urethritis       Relevant Orders   Urine cytology ancillary only (Completed)       Return in about 4 weeks (around 10/22/2022).    Gust Rung, DO

## 2022-09-25 NOTE — Assessment & Plan Note (Signed)
Notes that he has been taking albuterol and Incruse just as needed.  Discussed taking Incruse daily and albuterol as needed.

## 2022-09-25 NOTE — Assessment & Plan Note (Signed)
Stressed importance of taking antihypertensive medications and went over these in detail restart amlodipine I have also increased his metoprolol to 50 mg of Toprol XL for simplicity daily.

## 2022-09-25 NOTE — Assessment & Plan Note (Signed)
Had right MCA stenting in February initial plan was 3 months of dual antiplatelet with aspirin and Brilinta then pending interventional radiology recommendation.  He has not been back to see interventional radiology I will place a referral to get their input.

## 2022-09-29 ENCOUNTER — Ambulatory Visit: Payer: Medicare HMO

## 2022-10-22 ENCOUNTER — Other Ambulatory Visit: Payer: Self-pay

## 2022-10-22 ENCOUNTER — Telehealth (HOSPITAL_COMMUNITY): Payer: Self-pay

## 2022-10-22 ENCOUNTER — Ambulatory Visit: Payer: 59 | Admitting: Internal Medicine

## 2022-10-22 VITALS — BP 155/93 | HR 62 | Temp 97.6°F | Ht 73.0 in | Wt 152.7 lb

## 2022-10-22 DIAGNOSIS — F17210 Nicotine dependence, cigarettes, uncomplicated: Secondary | ICD-10-CM

## 2022-10-22 DIAGNOSIS — I10 Essential (primary) hypertension: Secondary | ICD-10-CM | POA: Diagnosis not present

## 2022-10-22 DIAGNOSIS — Z8673 Personal history of transient ischemic attack (TIA), and cerebral infarction without residual deficits: Secondary | ICD-10-CM | POA: Diagnosis not present

## 2022-10-22 MED ORDER — SPIRONOLACTONE 25 MG PO TABS: 25.0000 mg | ORAL_TABLET | Freq: Every day | ORAL | 3 refills | Status: DC

## 2022-10-22 NOTE — Progress Notes (Signed)
Established Patient Office Visit  Subjective   Patient ID: [name], male    DOB: 27-May-1950  Age: 72 y.o. MRN: [id number]  Chief Complaint  Patient presents with   Follow-up    BP   [name] is here for blood pressure follow-up.  He reports he has been taking his amlodipine 5 mg daily, he has not taken yet today.  Has not heard from [facility] radiology for IR follow-up.  Continues to take aspirin and Brilinta.  Denies headache visual changes and overall ports he is feeling well today.     Objective:     BP (!) 155/93 (BP Location: Right Arm, Patient Position: Sitting, Cuff Size: Normal)   Pulse 62   Temp 97.6 F (36.4 C) (Oral)   Ht 6\' 1"  (1.854 m)   Wt 152 lb 11.2 oz (69.3 kg)   SpO2 100% Comment: RA  BMI 20.15 kg/m  BP Readings from Last 3 Encounters:  10/22/22 (!) 155/93  09/24/22 (!) 153/117  08/16/22 (!) 174/117   Wt Readings from Last 3 Encounters:  10/22/22 152 lb 11.2 oz (69.3 kg)  09/24/22 150 lb (68 kg)  08/16/22 151 lb (68.5 kg)      Physical Exam Constitutional:      Appearance: Normal appearance.  Pulmonary:     Effort: Pulmonary effort is normal.  Neurological:     Mental Status: He is alert.  Psychiatric:        Mood and Affect: Mood normal.        Behavior: Behavior normal.      No results found for any visits on 10/22/22.  Last CBC Lab Results  Component Value Date   WBC 6.3 04/25/2022   HGB 9.9 (L) 04/25/2022   HCT 27.2 (L) 04/25/2022   MCV 84.2 04/25/2022   MCH 30.7 04/25/2022   RDW 14.8 04/25/2022   PLT 183 04/25/2022   Last metabolic panel Lab Results  Component Value Date   GLUCOSE 105 (H) 04/25/2022   NA 138 04/25/2022   K 3.2 (L) 04/25/2022   CL 107 04/25/2022   CO2 24 04/25/2022   BUN <5 (L) 04/25/2022   CREATININE 0.83 04/25/2022   GFRNONAA >60 04/25/2022   CALCIUM 8.3 (L) 04/25/2022   PHOS 2.0 (L) 04/25/2022   PROT 7.4 04/23/2022   ALBUMIN 3.8 04/23/2022   LABGLOB 2.4 12/25/2021   AGRATIO 1.8  12/25/2021   BILITOT 0.5 04/23/2022   ALKPHOS 110 04/23/2022   AST 31 04/23/2022   ALT 28 04/23/2022   ANIONGAP 7 04/25/2022   Last lipids Lab Results  Component Value Date   CHOL 126 04/24/2022   HDL 33 (L) 04/24/2022   LDLCALC 82 04/24/2022   TRIG 56 04/24/2022   CHOLHDL 3.8 04/24/2022      The ASCVD Risk score (Arnett DK, et al., 2019) failed to calculate for the following reasons:   The patient has a prior MI or stroke diagnosis    Assessment & Plan:   Problem List Items Addressed This Visit       Cardiovascular and Mediastinum   Essential hypertension - Primary (Chronic)    Continue amlodipine 5 mg daily will add spironolactone 25 mg given lower potassium levels historically.  Recheck be met today.  Recheck lipid panel and CBC.      Relevant Medications   spironolactone (ALDACTONE) 25 MG tablet   Other Relevant Orders   BMP8+Anion Gap   CBC no Diff   Lipid Profile  Other   History of CVA (cerebrovascular accident) (Chronic)    Currently on dual antiplatelet therapy we are working on IR follow-up to help determine duration of dual antiplatelet therapy.  Recheck lipid panel today.       Return in about 4 weeks (around 11/19/2022), or OK to [facility] for HTN follow up with Dr Mikey Bussing.    Gust Rung, DO

## 2022-10-22 NOTE — Assessment & Plan Note (Signed)
Currently on dual antiplatelet therapy we are working on IR follow-up to help determine duration of dual antiplatelet therapy.  Recheck lipid panel today.

## 2022-10-22 NOTE — Patient Instructions (Addendum)
We are working on your follow up with the Neuro Radiologist.  If you want to give them a call youself, I believe the number is [phone].  You are supposed to follow up with Dr Tommi Rumps Melchor Amour.

## 2022-10-22 NOTE — Assessment & Plan Note (Signed)
Continue amlodipine 5 mg daily will add spironolactone 25 mg given lower potassium levels historically.  Recheck be met today.  Recheck lipid panel and CBC.

## 2022-10-22 NOTE — Telephone Encounter (Signed)
Called to schedule diagnostic angiogram, no answer, left vm. AB  

## 2022-10-23 LAB — BMP8+ANION GAP
Anion Gap: 13 mmol/L (ref 10.0–18.0)
BUN/Creatinine Ratio: 10 (ref 10–24)
BUN: 10 mg/dL (ref 8–27)
CO2: 24 mmol/L (ref 20–29)
Calcium: 8.9 mg/dL (ref 8.6–10.2)
Chloride: 106 mmol/L (ref 96–106)
Creatinine, Ser: 1.03 mg/dL (ref 0.76–1.27)
Glucose: 78 mg/dL (ref 70–99)
Potassium: 4.3 mmol/L (ref 3.5–5.2)
Sodium: 143 mmol/L (ref 134–144)
eGFR: 77 mL/min/{1.73_m2} (ref >59)

## 2022-10-23 LAB — LIPID PANEL
Chol/HDL Ratio: 2.8 ratio (ref 0.0–5.0)
Cholesterol, Total: 152 mg/dL (ref 100–199)
HDL: 55 mg/dL (ref >39)
LDL Chol Calc (NIH): 82 mg/dL (ref 0–99)
Triglycerides: 77 mg/dL (ref 0–149)
VLDL Cholesterol Cal: 15 mg/dL (ref 5–40)

## 2022-10-23 LAB — CBC
Hematocrit: 37.8 % (ref 37.5–51.0)
Hemoglobin: 12.3 g/dL — ABNORMAL LOW (ref 13.0–17.7)
MCH: 28.2 pg (ref 26.6–33.0)
MCHC: 32.5 g/dL (ref 31.5–35.7)
MCV: 87 fL (ref 79–97)
Platelets: 235 10*3/uL (ref 150–450)
RBC: 4.36 x10E6/uL (ref 4.14–5.80)
RDW: 16.4 % — ABNORMAL HIGH (ref 11.6–15.4)
WBC: 4.2 10*3/uL (ref 3.4–10.8)

## 2022-11-02 ENCOUNTER — Ambulatory Visit (INDEPENDENT_AMBULATORY_CARE_PROVIDER_SITE_OTHER): Payer: 59 | Admitting: Neurology

## 2022-11-02 ENCOUNTER — Encounter: Payer: Self-pay | Admitting: Neurology

## 2022-11-02 VITALS — BP 150/101 | HR 75 | Ht 73.0 in | Wt 150.0 lb

## 2022-11-02 DIAGNOSIS — I63512 Cerebral infarction due to unspecified occlusion or stenosis of left middle cerebral artery: Secondary | ICD-10-CM

## 2022-11-02 NOTE — Patient Instructions (Addendum)
You may want to contact [facility] at [phone] to follow-up with Dr. Baldemar Lenis for your intracranial stent.  I will see you back in 1 year

## 2022-11-02 NOTE — Progress Notes (Unsigned)
Follow-up Visit   Date: 11/02/2022    MANG TAILOR MRN: [id number] DOB: 10-31-1950    [name] is a 72 y.o. right-handed male with history of right thalamic stroke (2010, manifesting with vision changes), sick sinus syndrome  S/p PPM, history of substance abuse, and tobacco use  returning to the clinic for follow-up of right MCA.  The patient was accompanied to the clinic by self.  IMPRESSION/PLAN: Right MCA stroke due to right M1 occlusion s/p thrombectomy, angioplasty, and stent (04/23/2022).  Vascular risk factors:  prior stroke, age, hyperlipidemia, hypertension, cocaine use.  Clinically, he is doing extremely well with no neurological deficits.  Appreciate PCP managing his risk factors of hyperlipidemia (LDL 82) and hypertension, which remains elevated.  Medication compliance was stressed Regarding his antiplatelet therapy, he has been on aspirin 81mg  and Brillinta 90mg  BID for his intracranial stent since February. I will reach out to Dr. Baldemar Lenis for antiplatelet management.   Return to clinic in 1 year  --------------------------------------------- History of present illness: In January, he was having spells of left side weakness and treated with dual antiplatelets with aspirin and plavix 75mg  for TIA, but missed a few doses and woke up with worsening left side weakness which prompted him to go to the ER on 2/15. CTA showed right M1 occlusion with significant penumbra. He underwent IR mechanical thromectomy with TICI3 flow acheieved. However, M1 segment became restenosed and he underwent stent. Postprocedure CT shows trace SAH, which was resolving on subsequent CT. MRI unable to be performed due to incompatible pacemaker. Urine drug screen was positive for cocaine. Upon discharge he was started on aspirin 81mg  and Brilinta 90mg  BID. He denies any ongoing weakness, numbness, tingling, speech/swallow difficulty, or vision changes.   UPDATE  11/02/2022:  He is here for follow-up visit.  He reports doing well since he was last here.  No new weakness, sensory complaints, difficulty with speech/swallow.  He continues to take aspirin 81mg  and Brilinta 90mg  twice daily.  BP has been elevated.  Medications:  Current Outpatient Medications on File Prior to Visit  Medication Sig Dispense Refill   albuterol (VENTOLIN HFA) 108 (90 Base) MCG/ACT inhaler INHALE 2 PUFFS BY MOUTH EVERY 6 HOURS AS NEEDED FOR WHEEZING OR SHORTNESS OF BREATH 18 g 0   amLODipine (NORVASC) 5 MG tablet Take 1 tablet (5 mg total) by mouth daily. 90 tablet 3   aspirin EC 81 MG tablet Take 1 tablet (81 mg total) by mouth daily. Swallow whole. 30 tablet 12   atorvastatin (LIPITOR) 80 MG tablet Take 1 tablet (80 mg total) by mouth daily. 90 tablet 3   cetirizine (ZYRTEC ALLERGY) 10 MG tablet Take 1 tablet (10 mg total) by mouth daily. 30 tablet 2   ezetimibe (ZETIA) 10 MG tablet Take 1 tablet by mouth once daily 90 tablet 3   fluticasone (FLONASE) 50 MCG/ACT nasal spray Place 1 spray into both nostrils daily. 9.9 mL 2   metoprolol succinate (TOPROL-XL) 50 MG 24 hr tablet Take 1 tablet (50 mg total) by mouth daily. Take with or immediately following a meal. 90 tablet 3   senna-docusate (SENOKOT-S) 8.6-50 MG tablet Take 1 tablet by mouth at bedtime. 30 tablet 1   spironolactone (ALDACTONE) 25 MG tablet Take 1 tablet (25 mg total) by mouth daily. 90 tablet 3   ticagrelor (BRILINTA) 90 MG TABS tablet Take 1 tablet (90 mg total) by mouth 2 (two) times daily. 60 tablet 0  umeclidinium bromide (INCRUSE ELLIPTA) 62.5 MCG/ACT AEPB Inhale 1 puff into the lungs daily. 90 each 2   nicotine (NICODERM CQ - DOSED IN MG/24 HOURS) 21 mg/24hr patch Place 1 patch (21 mg total) onto the skin daily. (Patient not taking: Reported on 11/02/2022) 28 patch 0   nicotine polacrilex (NICORETTE) 4 MG gum Take 1 each (4 mg total) by mouth as needed for smoking cessation. (Patient not taking: Reported on  11/02/2022) 100 tablet 1   No current facility-administered medications on file prior to visit.    Allergies:  Allergies  Allergen Reactions   Ace Inhibitors Swelling and Other (See Comments)    Patient presented with right upper lip swelling along with tingling and numbness. Possible allergic reaction to ACEI.    Vital Signs:  BP (!) 157/104   Pulse 75   Ht 6\' 1"  (1.854 m)   Wt 150 lb (68 kg)   SpO2 99%   BMI 19.79 kg/m   Neurological Exam: MENTAL STATUS including orientation to time, place, person, recent and remote memory, attention span and concentration, language, and fund of knowledge is normal.  Speech is not dysarthric.  CRANIAL NERVES:  No visual field defects.  Pupils equal round and reactive to light.  Normal conjugate, extra-ocular eye movements in all directions of gaze.  No ptosis.  Face is symmetric. Palate elevates symmetrically.  Tongue is midline.  MOTOR:  Motor strength is 5/5 in all extremities.  No atrophy, fasciculations or abnormal movements.  No pronator drift.  Tone is normal.    MSRs:  Reflexes are 2+/4 throughout.  SENSORY:  Intact to vibration throughout.  COORDINATION/GAIT:  Normal finger-to- nose-finger.  Intact rapid alternating movements bilaterally.  Gait narrow based and stable.   Data: CT head wo contrast 08/16/2022: 1. No acute intracranial abnormality. 2. Remote lacunar infarcts in the right basal ganglia and subinsular aeration. Stent in the right MCA stable positioning.    Thank you for allowing me to participate in patient's care.  If I can answer any additional questions, I would be pleased to do so.    Sincerely,    Solei Wubben K. Allena Katz, DO

## 2022-11-03 ENCOUNTER — Other Ambulatory Visit: Payer: Self-pay

## 2022-11-03 DIAGNOSIS — Z8673 Personal history of transient ischemic attack (TIA), and cerebral infarction without residual deficits: Secondary | ICD-10-CM

## 2022-11-04 ENCOUNTER — Other Ambulatory Visit (HOSPITAL_COMMUNITY): Payer: Self-pay | Admitting: Neuroradiology

## 2022-11-04 DIAGNOSIS — I771 Stricture of artery: Secondary | ICD-10-CM

## 2022-11-04 MED ORDER — TICAGRELOR 90 MG PO TABS: 90.0000 mg | ORAL_TABLET | Freq: Two times a day (BID) | ORAL | 0 refills | Status: DC

## 2022-11-05 ENCOUNTER — Ambulatory Visit: Payer: 59

## 2022-11-11 ENCOUNTER — Other Ambulatory Visit: Payer: Self-pay | Admitting: Radiology

## 2022-11-11 DIAGNOSIS — R519 Headache, unspecified: Secondary | ICD-10-CM

## 2022-11-12 ENCOUNTER — Other Ambulatory Visit (HOSPITAL_COMMUNITY): Payer: Self-pay | Admitting: Neuroradiology

## 2022-11-12 ENCOUNTER — Encounter (HOSPITAL_COMMUNITY): Payer: Self-pay

## 2022-11-12 ENCOUNTER — Ambulatory Visit (HOSPITAL_COMMUNITY)
Admission: RE | Admit: 2022-11-12 | Discharge: 2022-11-12 | Disposition: A | Discharge status: Home / Self Care | Payer: 59 | Source: Ambulatory Visit | Attending: Neuroradiology | Admitting: Neuroradiology

## 2022-11-12 DIAGNOSIS — I771 Stricture of artery: Secondary | ICD-10-CM

## 2022-11-12 DIAGNOSIS — R519 Headache, unspecified: Secondary | ICD-10-CM

## 2022-11-12 DIAGNOSIS — Z950 Presence of cardiac pacemaker: Secondary | ICD-10-CM | POA: Insufficient documentation

## 2022-11-12 DIAGNOSIS — Z5986 Financial insecurity: Secondary | ICD-10-CM | POA: Insufficient documentation

## 2022-11-12 DIAGNOSIS — Z5941 Food insecurity: Secondary | ICD-10-CM | POA: Insufficient documentation

## 2022-11-12 DIAGNOSIS — D573 Sickle-cell trait: Secondary | ICD-10-CM | POA: Insufficient documentation

## 2022-11-12 DIAGNOSIS — I651 Occlusion and stenosis of basilar artery: Secondary | ICD-10-CM | POA: Insufficient documentation

## 2022-11-12 DIAGNOSIS — I6523 Occlusion and stenosis of bilateral carotid arteries: Secondary | ICD-10-CM | POA: Insufficient documentation

## 2022-11-12 DIAGNOSIS — Z66 Do not resuscitate: Secondary | ICD-10-CM | POA: Insufficient documentation

## 2022-11-12 DIAGNOSIS — J439 Emphysema, unspecified: Secondary | ICD-10-CM | POA: Insufficient documentation

## 2022-11-12 DIAGNOSIS — Z832 Family history of diseases of the blood and blood-forming organs and certain disorders involving the immune mechanism: Secondary | ICD-10-CM | POA: Insufficient documentation

## 2022-11-12 DIAGNOSIS — Z8249 Family history of ischemic heart disease and other diseases of the circulatory system: Secondary | ICD-10-CM | POA: Insufficient documentation

## 2022-11-12 DIAGNOSIS — Z8673 Personal history of transient ischemic attack (TIA), and cerebral infarction without residual deficits: Secondary | ICD-10-CM | POA: Insufficient documentation

## 2022-11-12 DIAGNOSIS — Z7982 Long term (current) use of aspirin: Secondary | ICD-10-CM | POA: Insufficient documentation

## 2022-11-12 DIAGNOSIS — F17210 Nicotine dependence, cigarettes, uncomplicated: Secondary | ICD-10-CM | POA: Insufficient documentation

## 2022-11-12 DIAGNOSIS — I10 Essential (primary) hypertension: Secondary | ICD-10-CM | POA: Insufficient documentation

## 2022-11-12 DIAGNOSIS — Z7902 Long term (current) use of antithrombotics/antiplatelets: Secondary | ICD-10-CM | POA: Insufficient documentation

## 2022-11-12 HISTORY — PX: IR ANGIO VERTEBRAL SEL VERTEBRAL UNI R MOD SED: IMG5368

## 2022-11-12 HISTORY — PX: IR ANGIO INTRA EXTRACRAN SEL COM CAROTID INNOMINATE UNI L MOD SED: IMG5358

## 2022-11-12 HISTORY — PX: IR US GUIDE VASC ACCESS RIGHT: IMG2390

## 2022-11-12 HISTORY — PX: IR ANGIO INTRA EXTRACRAN SEL INTERNAL CAROTID UNI R MOD SED: IMG5362

## 2022-11-12 LAB — BASIC METABOLIC PANEL
Anion gap: 13 (ref 5–15)
BUN: 10 mg/dL (ref 8–23)
CO2: 25 mmol/L (ref 22–32)
Calcium: 9 mg/dL (ref 8.9–10.3)
Chloride: 104 mmol/L (ref 98–111)
Creatinine, Ser: 0.98 mg/dL (ref 0.61–1.24)
GFR, Estimated: >60 mL/min (ref >60)
Glucose, Bld: 98 mg/dL (ref 70–99)
Potassium: 3.5 mmol/L (ref 3.5–5.1)
Sodium: 142 mmol/L (ref 135–145)

## 2022-11-12 LAB — CBC
HCT: 34.1 % — ABNORMAL LOW (ref 39.0–52.0)
Hemoglobin: 11.4 g/dL — ABNORMAL LOW (ref 13.0–17.0)
MCH: 28.6 pg (ref 26.0–34.0)
MCHC: 33.4 g/dL (ref 30.0–36.0)
MCV: 85.7 fL (ref 80.0–100.0)
Platelets: 203 10*3/uL (ref 150–400)
RBC: 3.98 MIL/uL — ABNORMAL LOW (ref 4.22–5.81)
RDW: 16.6 % — ABNORMAL HIGH (ref 11.5–15.5)
WBC: 3.5 10*3/uL — ABNORMAL LOW (ref 4.0–10.5)
nRBC: 0 % (ref 0.0–0.2)

## 2022-11-12 LAB — PROTIME-INR
INR: 1.1 (ref 0.8–1.2)
Prothrombin Time: 14.6 s (ref 11.4–15.2)

## 2022-11-12 MED ORDER — LIDOCAINE HCL 1 % IJ SOLN
INTRAMUSCULAR | Status: AC
  Filled 2022-11-12: qty 20

## 2022-11-12 MED ORDER — VERAPAMIL HCL 2.5 MG/ML IV SOLN
INTRAVENOUS | Status: AC
  Filled 2022-11-12: qty 2

## 2022-11-12 MED ORDER — IOHEXOL 300 MG/ML  SOLN: 150.0000 mL | Freq: Once | INTRAMUSCULAR | Status: DC | PRN

## 2022-11-12 MED ORDER — FENTANYL CITRATE (PF) 100 MCG/2ML IJ SOLN
INTRAMUSCULAR | Status: AC | PRN
Start: 2022-11-12 — End: 2022-11-12
  Administered 2022-11-12 (×2): 50 ug via INTRAVENOUS

## 2022-11-12 MED ORDER — SODIUM CHLORIDE 0.9 % IV SOLN: Freq: Once | INTRAVENOUS | Status: AC

## 2022-11-12 MED ORDER — ACETAMINOPHEN 325 MG PO TABS
ORAL_TABLET | ORAL | Status: AC
  Filled 2022-11-12: qty 2

## 2022-11-12 MED ORDER — ACETAMINOPHEN 325 MG PO TABS
650.0000 mg | ORAL_TABLET | Freq: Four times a day (QID) | ORAL | Status: DC | PRN
  Administered 2022-11-12: 650 mg via ORAL
  Filled 2022-11-12: qty 2

## 2022-11-12 MED ORDER — HEPARIN SODIUM (PORCINE) 1000 UNIT/ML IJ SOLN
INTRAMUSCULAR | Status: AC
  Filled 2022-11-12: qty 10

## 2022-11-12 MED ORDER — MIDAZOLAM HCL 2 MG/2ML IJ SOLN
INTRAMUSCULAR | Status: AC | PRN
  Administered 2022-11-12 (×2): 1 mg via INTRAVENOUS

## 2022-11-12 MED ORDER — VERAPAMIL HCL 2.5 MG/ML IV SOLN
INTRA_ARTERIAL | Status: AC | PRN
  Administered 2022-11-12: 6 mL via INTRA_ARTERIAL

## 2022-11-12 MED ORDER — FENTANYL CITRATE (PF) 100 MCG/2ML IJ SOLN
INTRAMUSCULAR | Status: AC
  Filled 2022-11-12: qty 4

## 2022-11-12 MED ORDER — MIDAZOLAM HCL 2 MG/2ML IJ SOLN
INTRAMUSCULAR | Status: AC
  Filled 2022-11-12: qty 4

## 2022-11-12 MED ORDER — NITROGLYCERIN 1 MG/10 ML FOR IR/CATH LAB
INTRA_ARTERIAL | Status: AC
  Filled 2022-11-12: qty 10

## 2022-11-12 NOTE — H&P (Addendum)
Chief Complaint: Patient was seen in consultation today for Cerebral arteriogram at the request of Dr Loistine Simas  Supervising Physician: Baldemar Lenis  Patient Status: [facility] - Out-pt  History of Present Illness: [name] is a 72 year old male who presented to the Emergency Department on 04/23/2022 with left-sided weakness; NIHSS 6. He was found to have a proximal right M1/MCA occlusion with underlying atherosclerotic stenosis. He underwent successful mechanical thrombectomy for treatment of the proximal right M1/MCA occlusion with placement of a drug eluting stent in his right MCA treatment of the underlying atherosclerotic high-grade stenosis.  His past medical history significant for right thalamic stroke, sinoatrial node dysfunction status post pacemaker, emphysema, hypertension and sickle cell trait.  Rescinding DNR Code status for IR procedure per pt Known to NIR R MCA revascularization 04/25/22 Has done well since No complaints Confirms still takes ASA/Brilinta daily  CT Head 08/16/22 for headaches: IMPRESSION: 1. No acute intracranial abnormality. 2. Remote lacunar infarcts in the right basal ganglia and subinsular aeration. Stent in the right MCA stable positioning.   Scheduled today for follow up cerebral arteriogram with Dr Tommi Rumps Melchor Amour.     Past Medical History:  Diagnosis Date   Atrial tachycardia 04/11/2012   Bilateral flank pain 06/26/2020   CEREBROVASCULAR ACCIDENT 10/17/2008   Acute CVA of right thalamus 10/03/08 ECHO performed 2/2 CVA 10/09/08, EF 55-65%  Aspirin 325 mg daily and try to work on quitting smoking.    Emphysema    per multiple CXRs   Hypertension    goal < 140/90   Sickle cell trait (HCC)    Snoring    Sleep study 09/02/10 was WNL   Stroke (HCC) 10/03/2008   Right thalamus   TIA (transient ischemic attack) 12/25/2021   Tuberculosis at age 8-3    Past Surgical History:  Procedure Laterality Date   APPENDECTOMY   1977   IR CT HEAD LTD  04/23/2022   IR CT HEAD LTD  04/23/2022   IR PERCUTANEOUS ART THROMBECTOMY/INFUSION INTRACRANIAL INC DIAG ANGIO  04/23/2022   IR PTA INTRACRANIAL  04/23/2022   IR US GUIDE VASC ACCESS RIGHT  04/23/2022   IR US GUIDE VASC ACCESS RIGHT  04/23/2022   PACEMAKER INSERTION  07/24/08   MDT Adapta L implanted by Dr Fredrich Birks   RADIOLOGY WITH ANESTHESIA N/A 04/23/2022   Procedure: IR WITH ANESTHESIA;  Surgeon: Radiologist, Medication, MD;  Location: [facility] OR;  Service: Radiology;  Laterality: N/A;   VASECTOMY  1990    Allergies: Ace inhibitors  Medications: Prior to Admission medications   Medication Sig Start Date End Date Taking? Authorizing Provider  albuterol (VENTOLIN HFA) 108 (90 Base) MCG/ACT inhaler INHALE 2 PUFFS BY MOUTH EVERY 6 HOURS AS NEEDED FOR WHEEZING OR SHORTNESS OF BREATH 05/06/22   Gust Rung, DO  amLODipine (NORVASC) 5 MG tablet Take 1 tablet (5 mg total) by mouth daily. 09/24/22 09/24/23  Gust Rung, DO  aspirin EC 81 MG tablet Take 1 tablet (81 mg total) by mouth daily. Swallow whole. 04/26/22   de Saintclair Halsted, Cortney E, NP  atorvastatin (LIPITOR) 80 MG tablet Take 1 tablet (80 mg total) by mouth daily. 05/07/22   Gust Rung, DO  cetirizine (ZYRTEC ALLERGY) 10 MG tablet Take 1 tablet (10 mg total) by mouth daily. 01/15/22 01/15/23  Merrilyn Puma, MD  ezetimibe (ZETIA) 10 MG tablet Take 1 tablet by mouth once daily 07/20/22   Gust Rung, DO  fluticasone (FLONASE) 50 MCG/ACT  nasal spray Place 1 spray into both nostrils daily. 07/23/22 07/23/23  Mapp, Gaylyn Cheers, MD  metoprolol succinate (TOPROL-XL) 50 MG 24 hr tablet Take 1 tablet (50 mg total) by mouth daily. Take with or immediately following a meal. 09/24/22   Mikey Bussing, Marthenia Rolling, DO  nicotine (NICODERM CQ - DOSED IN MG/24 HOURS) 21 mg/24hr patch Place 1 patch (21 mg total) onto the skin daily. Patient not taking: Reported on 11/02/2022 06/24/22   Merrilyn Puma, MD  nicotine polacrilex (NICORETTE) 4 MG gum Take 1  each (4 mg total) by mouth as needed for smoking cessation. Patient not taking: Reported on 11/02/2022 06/24/22   Merrilyn Puma, MD  senna-docusate (SENOKOT-S) 8.6-50 MG tablet Take 1 tablet by mouth at bedtime. 05/14/22   Merrilee Jansky, MD  spironolactone (ALDACTONE) 25 MG tablet Take 1 tablet (25 mg total) by mouth daily. 10/22/22   Gust Rung, DO  ticagrelor (BRILINTA) 90 MG TABS tablet Take 1 tablet (90 mg total) by mouth 2 (two) times daily. 11/04/22   Gust Rung, DO  umeclidinium bromide (INCRUSE ELLIPTA) 62.5 MCG/ACT AEPB Inhale 1 puff into the lungs daily. 09/24/22   Gust Rung, DO     Family History  Problem Relation Age of Onset   Hypertension Mother    Cancer Sister        Unknown   Hypertension Sister    Diabetes Sister    Cancer Sister    Sickle cell trait Son    Sickle cell anemia Son     Social History   Socioeconomic History   Marital status: Married    Spouse name: Not on file   Number of children: 6   Years of education: 15   Highest education level: Not on file  Occupational History   Occupation: Disabled    Comment: PT Administrator at [employer]  Tobacco Use   Smoking status: Every Day    Current packs/day: 0.10    Average packs/day: 0.1 packs/day for 10.0 years (1.0 ttl pk-yrs)    Types: Cigarettes   Smokeless tobacco: Never   Tobacco comments:    2 cigs daily  Vaping Use   Vaping status: Former  Substance and Sexual Activity   Alcohol use: No    Alcohol/week: 0.0 standard drinks of alcohol   Drug use: No    Comment: previous polysubstance abuser, quit x 13 years   Sexual activity: Yes    Birth control/protection: Condom  Other Topics Concern   Not on file  Social History Narrative   Current Social History 06/29/2019        Patient lives with spouse in a ground floor apartment which is 1 story. There are not steps up to the entrance the patient uses.       Patient's method of transportation is personal car.      The highest level of  education was Bachelor's Degree; now working on [employer].      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      Identified important Relationships are "My wife"      Pets : None       Interests / Fun: Computers, "I'm an Chief Technology Officer."       Current Stressors: "I hardly let anything bother me. I walk away"       Religious / Personal Beliefs: Pent[employer]al Holiness       L. Ducatte, BSN, RN-BC  Social Determinants of Health   Financial Resource Strain: Medium Risk (01/15/2022)   Overall Financial Resource Strain (CARDIA)    Difficulty of Paying Living Expenses: Somewhat hard  Food Insecurity: No Food Insecurity (04/07/2022)   Hunger Vital Sign    Worried About Running Out of Food in the Last Year: Never true    Ran Out of Food in the Last Year: Never true  Recent Concern: Food Insecurity - Food Insecurity Present (01/15/2022)   Hunger Vital Sign    Worried About Running Out of Food in the Last Year: Often true    Ran Out of Food in the Last Year: Sometimes true  Transportation Needs: No Transportation Needs (04/07/2022)   PRAPARE - Administrator, Civil Service (Medical): No    Lack of Transportation (Non-Medical): No  Physical Activity: Insufficiently Active (01/15/2022)   Exercise Vital Sign    Days of Exercise per Week: 3 days    Minutes of Exercise per Session: 20 min  Stress: Stress Concern Present (01/15/2022)   [employer] of Occupational Health - Occupational Stress Questionnaire    Feeling of Stress : To some extent  Social Connections: Socially Integrated (01/15/2022)   Social Connection and Isolation Panel [NHANES]    Frequency of Communication with Friends and Family: More than three times a week    Frequency of Social Gatherings with Friends and Family: Once a week    Attends Religious Services: More than 4 times per year    Active Member of [employer] or Organizations: Yes     Attends Banker Meetings: Never    Marital Status: Married    Review of Systems: A 12 point ROS discussed and pertinent positives are indicated in the HPI above.  All other systems are negative.  Review of Systems  Constitutional:  Negative for activity change, fatigue and fever.  HENT:  Negative for tinnitus and trouble swallowing.   Eyes:  Negative for visual disturbance.  Respiratory:  Negative for cough and shortness of breath.   Cardiovascular:  Negative for chest pain.  Gastrointestinal:  Negative for abdominal pain, nausea and vomiting.  Musculoskeletal:  Negative for gait problem.  Neurological:  Negative for dizziness, tremors, seizures, syncope, facial asymmetry, speech difficulty, weakness, light-headedness, numbness and headaches.  Psychiatric/Behavioral:  Negative for behavioral problems and confusion.     Vital Signs: BP (!) 155/93   Pulse 65   Temp 97.6 F (36.4 C) (Temporal)   Resp 18   Ht 6' 1.25" (1.861 m)   Wt 150 lb (68 kg)   SpO2 99%   BMI 19.66 kg/m   Advance Care Plan: The advanced care plan/surrogate decision maker was discussed at the time of visit and documented in the medical record.    Physical Exam Vitals reviewed.  HENT:     Mouth/Throat:     Mouth: Mucous membranes are moist.  Eyes:     Extraocular Movements: Extraocular movements intact.  Cardiovascular:     Rate and Rhythm: Normal rate and regular rhythm.     Heart sounds: Normal heart sounds.  Pulmonary:     Effort: Pulmonary effort is normal.     Breath sounds: Normal breath sounds. No wheezing.  Abdominal:     Palpations: Abdomen is soft.     Tenderness: There is no abdominal tenderness.  Musculoskeletal:        General: Normal range of motion.     Right lower leg: No edema.     Left lower leg:  No edema.  Skin:    General: Skin is warm.  Neurological:     Mental Status: He is alert and oriented to person, place, and time.  Psychiatric:        Mood and  Affect: Mood normal.        Behavior: Behavior normal.        Thought Content: Thought content normal.        Judgment: Judgment normal.     Imaging: No results found.  Labs:  CBC: Recent Labs    04/09/22 0338 04/23/22 1428 04/23/22 1431 04/25/22 0255 10/22/22 1008  WBC 4.4 5.0  --  6.3 4.2  HGB 12.2* 13.2 12.9* 9.9* 12.3*  HCT 35.4* 37.4* 38.0* 27.2* 37.8  PLT 213 211  --  183 235    COAGS: Recent Labs    04/23/22 1428  INR 1.1  APTT 29    BMP: Recent Labs    04/08/22 0528 04/09/22 0338 04/23/22 1428 04/23/22 1431 04/25/22 0255 10/22/22 1008  NA 135 135 140 143 138 143  K 3.6 3.9 3.2* 3.4* 3.2* 4.3  CL 100 101 105 104 107 106  CO2 25 26 26   --  24 24  GLUCOSE 94 99 98 95 105* 78  BUN 9 18 7* 7* <5* 10  CALCIUM 8.7* 9.1 9.1  --  8.3* 8.9  CREATININE 0.84 0.89 0.93 0.90 0.83 1.03  GFRNONAA >60 >60 >60  --  >60  --     LIVER FUNCTION TESTS: Recent Labs    12/25/21 1010 04/07/22 1904 04/23/22 1428  BILITOT 0.5 0.7 0.5  AST 33 39 31  ALT 36 24 28  ALKPHOS 125* 129* 110  PROT 6.8 6.9 7.4  ALBUMIN 4.4 3.7 3.8    TUMOR MARKERS: No results for input(s): "AFPTM", "CEA", "CA199", "CHROMGRNA" in the last 8760 hours.  Assessment and Plan:  Scheduled for Cerebral arteriogram Follow up R MCA revascularization 04/25/22 Risks and benefits of cerebral angiogram with intervention were discussed with the patient including, but not limited to bleeding, infection, vascular injury, contrast induced renal failure, stroke or even death.  This interventional procedure involves the use of X-rays and because of the nature of the planned procedure, it is possible that we will have prolonged use of X-ray fluoroscopy.  Potential radiation risks to you include (but are not limited to) the following: - A slightly elevated risk for cancer  several years later in life. This risk is typically less than 0.5% percent. This risk is low in comparison to the normal incidence  of human cancer, which is 33% for women and 50% for men according to the American Cancer Society. - Radiation induced injury can include skin redness, resembling a rash, tissue breakdown / ulcers and hair loss (which can be temporary or permanent).   The likelihood of either of these occurring depends on the difficulty of the procedure and whether you are sensitive to radiation due to previous procedures, disease, or genetic conditions.   IF your procedure requires a prolonged use of radiation, you will be notified and given written instructions for further action.  It is your responsibility to monitor the irradiated area for the 2 weeks following the procedure and to notify your physician if you are concerned that you have suffered a radiation induced injury.    All of the patient's questions were answered, patient is agreeable to proceed.  Consent signed and in chart.  Thank you for this interesting consult.  I greatly enjoyed  meeting [name] and look forward to participating in their care.  A copy of this report was sent to the requesting provider on this date.  Electronically Signed: Robet Leu, PA-C 11/12/2022, 7:40 AM   I spent a total of  30 Minutes   in face to face in clinical consultation, greater than 50% of which was counseling/coordinating care for Cerebral arteriogram

## 2022-11-12 NOTE — Progress Notes (Signed)
TR BAND REMOVAL  LOCATION:    right radial  DEFLATED PER PROTOCOL:    Yes.    TIME BAND OFF / DRESSING APPLIED:    1248 gauze dressing applied   SITE UPON ARRIVAL:    Level 0  SITE AFTER BAND REMOVAL:    Level 0  CIRCULATION SENSATION AND MOVEMENT:    Within Normal Limits   Yes.    COMMENTS:   no issues noted

## 2022-11-12 NOTE — Discharge Instructions (Signed)

## 2022-11-12 NOTE — Procedures (Signed)
INTERVENTIONAL NEURORADIOLOGY BRIEF POSTPROCEDURE NOTE  DIAGNOSTIC CEREBRAL ANGIOGRAM  Attending physician: Dr. Baldemar Lenis  Diagnosis: Right MCA stenosis status post stenting  Access site: Right radial artery.  Access closure: TR band  Anesthesia: IR sedation: Moderate sedation  Medication used: 2 mg Versed IV; 100 mcg Fentanyl IV.  Complications: None.  Estimated blood loss: None.  Specimen: None.  Findings: Status post right M1/MCA stenting and angioplasty. The implant is widely open without evidence of residual stenosis or significant neointimal hyperplasia.  The patient tolerated the procedure well without incident or complication and is in stable condition.   Recommendation: Patient may discontinue the brilinta from the neurointervention standpoint. Continuing on 81 mg Aspirin/day is recommended for life.

## 2022-11-13 ENCOUNTER — Encounter: Payer: Self-pay | Admitting: Pharmacist

## 2022-11-15 ENCOUNTER — Other Ambulatory Visit: Payer: Self-pay

## 2022-11-15 ENCOUNTER — Encounter (HOSPITAL_COMMUNITY): Payer: Self-pay

## 2022-11-15 ENCOUNTER — Emergency Department (HOSPITAL_COMMUNITY): Payer: 59

## 2022-11-15 ENCOUNTER — Inpatient Hospital Stay (HOSPITAL_COMMUNITY)
Admission: EM | Admit: 2022-11-15 | Discharge: 2022-11-18 | DRG: 322 | Disposition: A | Discharge status: Home / Self Care | Payer: 59 | Attending: Internal Medicine | Admitting: Internal Medicine

## 2022-11-15 DIAGNOSIS — J439 Emphysema, unspecified: Secondary | ICD-10-CM | POA: Diagnosis present

## 2022-11-15 DIAGNOSIS — Z7902 Long term (current) use of antithrombotics/antiplatelets: Secondary | ICD-10-CM

## 2022-11-15 DIAGNOSIS — E785 Hyperlipidemia, unspecified: Secondary | ICD-10-CM | POA: Diagnosis present

## 2022-11-15 DIAGNOSIS — Z8249 Family history of ischemic heart disease and other diseases of the circulatory system: Secondary | ICD-10-CM

## 2022-11-15 DIAGNOSIS — Z833 Family history of diabetes mellitus: Secondary | ICD-10-CM

## 2022-11-15 DIAGNOSIS — I214 Non-ST elevation (NSTEMI) myocardial infarction: Principal | ICD-10-CM

## 2022-11-15 DIAGNOSIS — D573 Sickle-cell trait: Secondary | ICD-10-CM | POA: Diagnosis present

## 2022-11-15 DIAGNOSIS — R519 Headache, unspecified: Secondary | ICD-10-CM | POA: Diagnosis present

## 2022-11-15 DIAGNOSIS — F1410 Cocaine abuse, uncomplicated: Secondary | ICD-10-CM | POA: Diagnosis present

## 2022-11-15 DIAGNOSIS — Z950 Presence of cardiac pacemaker: Secondary | ICD-10-CM

## 2022-11-15 DIAGNOSIS — Z95820 Peripheral vascular angioplasty status with implants and grafts: Secondary | ICD-10-CM | POA: Diagnosis not present

## 2022-11-15 DIAGNOSIS — I480 Paroxysmal atrial fibrillation: Secondary | ICD-10-CM | POA: Diagnosis present

## 2022-11-15 DIAGNOSIS — E876 Hypokalemia: Secondary | ICD-10-CM | POA: Diagnosis present

## 2022-11-15 DIAGNOSIS — Z66 Do not resuscitate: Secondary | ICD-10-CM | POA: Diagnosis present

## 2022-11-15 DIAGNOSIS — I6601 Occlusion and stenosis of right middle cerebral artery: Secondary | ICD-10-CM | POA: Diagnosis present

## 2022-11-15 DIAGNOSIS — I2510 Atherosclerotic heart disease of native coronary artery without angina pectoris: Secondary | ICD-10-CM | POA: Diagnosis present

## 2022-11-15 DIAGNOSIS — Z832 Family history of diseases of the blood and blood-forming organs and certain disorders involving the immune mechanism: Secondary | ICD-10-CM | POA: Diagnosis not present

## 2022-11-15 DIAGNOSIS — Z79899 Other long term (current) drug therapy: Secondary | ICD-10-CM

## 2022-11-15 DIAGNOSIS — Z91148 Patient's other noncompliance with medication regimen for other reason: Secondary | ICD-10-CM

## 2022-11-15 DIAGNOSIS — I2119 ST elevation (STEMI) myocardial infarction involving other coronary artery of inferior wall: Principal | ICD-10-CM | POA: Diagnosis present

## 2022-11-15 DIAGNOSIS — I10 Essential (primary) hypertension: Secondary | ICD-10-CM | POA: Diagnosis present

## 2022-11-15 DIAGNOSIS — J449 Chronic obstructive pulmonary disease, unspecified: Secondary | ICD-10-CM | POA: Diagnosis present

## 2022-11-15 DIAGNOSIS — J4489 Other specified chronic obstructive pulmonary disease: Secondary | ICD-10-CM

## 2022-11-15 DIAGNOSIS — I495 Sick sinus syndrome: Secondary | ICD-10-CM | POA: Diagnosis present

## 2022-11-15 DIAGNOSIS — R079 Chest pain, unspecified: Secondary | ICD-10-CM | POA: Diagnosis present

## 2022-11-15 DIAGNOSIS — F17210 Nicotine dependence, cigarettes, uncomplicated: Secondary | ICD-10-CM | POA: Diagnosis present

## 2022-11-15 DIAGNOSIS — I672 Cerebral atherosclerosis: Secondary | ICD-10-CM | POA: Diagnosis present

## 2022-11-15 DIAGNOSIS — Z7951 Long term (current) use of inhaled steroids: Secondary | ICD-10-CM

## 2022-11-15 DIAGNOSIS — Z7982 Long term (current) use of aspirin: Secondary | ICD-10-CM

## 2022-11-15 DIAGNOSIS — Z8481 Family history of carrier of genetic disease: Secondary | ICD-10-CM

## 2022-11-15 DIAGNOSIS — Z8673 Personal history of transient ischemic attack (TIA), and cerebral infarction without residual deficits: Secondary | ICD-10-CM

## 2022-11-15 DIAGNOSIS — Z888 Allergy status to other drugs, medicaments and biological substances status: Secondary | ICD-10-CM

## 2022-11-15 DIAGNOSIS — Z955 Presence of coronary angioplasty implant and graft: Secondary | ICD-10-CM

## 2022-11-15 LAB — CBC
HCT: 39.3 % (ref 39.0–52.0)
Hemoglobin: 13.3 g/dL (ref 13.0–17.0)
MCH: 28.8 pg (ref 26.0–34.0)
MCHC: 33.8 g/dL (ref 30.0–36.0)
MCV: 85.1 fL (ref 80.0–100.0)
Platelets: 216 10*3/uL (ref 150–400)
RBC: 4.62 MIL/uL (ref 4.22–5.81)
RDW: 16.2 % — ABNORMAL HIGH (ref 11.5–15.5)
WBC: 5.4 10*3/uL (ref 4.0–10.5)
nRBC: 0 % (ref 0.0–0.2)

## 2022-11-15 LAB — BASIC METABOLIC PANEL
Anion gap: 13 (ref 5–15)
BUN: 8 mg/dL (ref 8–23)
CO2: 26 mmol/L (ref 22–32)
Calcium: 9.5 mg/dL (ref 8.9–10.3)
Chloride: 102 mmol/L (ref 98–111)
Creatinine, Ser: 1.05 mg/dL (ref 0.61–1.24)
GFR, Estimated: >60 mL/min (ref >60)
Glucose, Bld: 125 mg/dL — ABNORMAL HIGH (ref 70–99)
Potassium: 3.1 mmol/L — ABNORMAL LOW (ref 3.5–5.1)
Sodium: 141 mmol/L (ref 135–145)

## 2022-11-15 LAB — TROPONIN I (HIGH SENSITIVITY)
Troponin I (High Sensitivity): 626 ng/L (ref <18)
Troponin I (High Sensitivity): 1678 ng/L (ref <18)
Troponin I (High Sensitivity): >24000 ng/L (ref <18)

## 2022-11-15 LAB — HEPARIN LEVEL (UNFRACTIONATED): Heparin Unfractionated: 0.42 [IU]/mL (ref 0.30–0.70)

## 2022-11-15 LAB — LIPID PANEL
Cholesterol: 163 mg/dL (ref 0–200)
HDL: 58 mg/dL (ref >40)
LDL Cholesterol: 95 mg/dL (ref 0–99)
Total CHOL/HDL Ratio: 2.8 ratio
Triglycerides: 49 mg/dL (ref <150)
VLDL: 10 mg/dL (ref 0–40)

## 2022-11-15 LAB — MAGNESIUM: Magnesium: 1.9 mg/dL (ref 1.7–2.4)

## 2022-11-15 MED ORDER — HEPARIN BOLUS VIA INFUSION
4000.0000 [IU] | Freq: Once | INTRAVENOUS | Status: AC
  Administered 2022-11-15: 4000 [IU] via INTRAVENOUS
  Filled 2022-11-15: qty 4000

## 2022-11-15 MED ORDER — SODIUM CHLORIDE 0.9 % WEIGHT BASED INFUSION
3.0000 mL/kg/h | INTRAVENOUS | Status: DC
  Administered 2022-11-16: 3 mL/kg/h via INTRAVENOUS

## 2022-11-15 MED ORDER — SODIUM CHLORIDE 0.9 % WEIGHT BASED INFUSION
1.0000 mL/kg/h | INTRAVENOUS | Status: DC
  Administered 2022-11-16: 1 mL/kg/h via INTRAVENOUS

## 2022-11-15 MED ORDER — ASPIRIN 81 MG PO CHEW
81.0000 mg | CHEWABLE_TABLET | ORAL | Status: AC
  Administered 2022-11-16: 81 mg via ORAL
  Filled 2022-11-15: qty 1

## 2022-11-15 MED ORDER — AMLODIPINE BESYLATE 5 MG PO TABS: 5.0000 mg | ORAL_TABLET | Freq: Every day | ORAL | Status: DC

## 2022-11-15 MED ORDER — ALBUTEROL SULFATE (2.5 MG/3ML) 0.083% IN NEBU
2.5000 mg | INHALATION_SOLUTION | Freq: Every day | RESPIRATORY_TRACT | Status: DC | PRN
  Administered 2022-11-16: 2.5 mg via RESPIRATORY_TRACT
  Filled 2022-11-15: qty 3

## 2022-11-15 MED ORDER — ALBUTEROL SULFATE HFA 108 (90 BASE) MCG/ACT IN AERS: 1.0000 | INHALATION_SPRAY | Freq: Every day | RESPIRATORY_TRACT | Status: DC | PRN

## 2022-11-15 MED ORDER — ATORVASTATIN CALCIUM 80 MG PO TABS
80.0000 mg | ORAL_TABLET | Freq: Every day | ORAL | Status: DC
  Administered 2022-11-15 – 2022-11-18 (×4): 80 mg via ORAL
  Filled 2022-11-15: qty 2
  Filled 2022-11-15 (×3): qty 1

## 2022-11-15 MED ORDER — NITROGLYCERIN 0.4 MG SL SUBL
0.4000 mg | SUBLINGUAL_TABLET | SUBLINGUAL | Status: DC | PRN
  Administered 2022-11-15 – 2022-11-16 (×2): 0.4 mg via SUBLINGUAL
  Filled 2022-11-15 (×2): qty 1

## 2022-11-15 MED ORDER — HEPARIN (PORCINE) 25000 UT/250ML-% IV SOLN
850.0000 [IU]/h | INTRAVENOUS | Status: DC
  Administered 2022-11-15: 850 [IU]/h via INTRAVENOUS
  Filled 2022-11-15: qty 250

## 2022-11-15 MED ORDER — POTASSIUM CHLORIDE CRYS ER 20 MEQ PO TBCR
40.0000 meq | EXTENDED_RELEASE_TABLET | Freq: Two times a day (BID) | ORAL | Status: AC
  Administered 2022-11-15 – 2022-11-16 (×2): 40 meq via ORAL
  Filled 2022-11-15 (×2): qty 2

## 2022-11-15 MED ORDER — TICAGRELOR 90 MG PO TABS
180.0000 mg | ORAL_TABLET | Freq: Once | ORAL | Status: AC
  Administered 2022-11-15: 180 mg via ORAL
  Filled 2022-11-15: qty 2

## 2022-11-15 MED ORDER — EZETIMIBE 10 MG PO TABS
10.0000 mg | ORAL_TABLET | Freq: Every day | ORAL | Status: DC
  Administered 2022-11-16 – 2022-11-18 (×3): 10 mg via ORAL
  Filled 2022-11-15 (×3): qty 1

## 2022-11-15 MED ORDER — HYDRALAZINE HCL 25 MG PO TABS
25.0000 mg | ORAL_TABLET | Freq: Four times a day (QID) | ORAL | Status: DC | PRN
  Administered 2022-11-15 – 2022-11-16 (×2): 25 mg via ORAL
  Filled 2022-11-15 (×2): qty 1

## 2022-11-15 MED ORDER — METOPROLOL TARTRATE 12.5 MG HALF TABLET
12.5000 mg | ORAL_TABLET | Freq: Two times a day (BID) | ORAL | Status: DC
  Administered 2022-11-15 – 2022-11-18 (×6): 12.5 mg via ORAL
  Filled 2022-11-15 (×6): qty 1

## 2022-11-15 MED ORDER — UMECLIDINIUM BROMIDE 62.5 MCG/ACT IN AEPB
1.0000 | INHALATION_SPRAY | Freq: Every day | RESPIRATORY_TRACT | Status: DC
  Administered 2022-11-17 – 2022-11-18 (×2): 1 via RESPIRATORY_TRACT
  Filled 2022-11-15: qty 7

## 2022-11-15 MED ORDER — ASPIRIN 325 MG PO TABS
325.0000 mg | ORAL_TABLET | Freq: Every day | ORAL | Status: DC
  Administered 2022-11-15: 325 mg via ORAL
  Filled 2022-11-15: qty 1

## 2022-11-15 MED ORDER — TICAGRELOR 90 MG PO TABS
180.0000 mg | ORAL_TABLET | Freq: Once | ORAL | Status: DC
  Filled 2022-11-15: qty 2

## 2022-11-15 NOTE — ED Provider Notes (Signed)
[place] EMERGENCY DEPARTMENT AT [facility] Provider Note   CSN: [id number] Arrival date & time: 11/15/22  0800     History  Chief Complaint  Patient presents with   Chest Pain    [name] is a 72 y.o. male with history of hypertension, hyperlipidemia, smoking, symptomatic bradycardia with Medtronic dual-chamber pacemaker, history of MCA stroke status post thrombectomy and stent placement, presented to ED with chest discomfort.  Patient reports this woke him from sleep around 5 AM this morning.  He reports a burning pressure sensation in his chest associated with nausea and dry heaving.  He has never had the symptoms before.  Symptoms have mildly improved since coming into the hospital, but are still present.  He denies any known history of coronary disease or cardiac stenting.  HPI     Home Medications Prior to Admission medications   Medication Sig Start Date End Date Taking? Authorizing Provider  albuterol (VENTOLIN HFA) 108 (90 Base) MCG/ACT inhaler INHALE 2 PUFFS BY MOUTH EVERY 6 HOURS AS NEEDED FOR WHEEZING OR SHORTNESS OF BREATH 05/06/22   Gust Rung, DO  amLODipine (NORVASC) 5 MG tablet Take 1 tablet (5 mg total) by mouth daily. 09/24/22 09/24/23  Gust Rung, DO  aspirin EC 81 MG tablet Take 1 tablet (81 mg total) by mouth daily. Swallow whole. 04/26/22   de Saintclair Halsted, Cortney E, NP  atorvastatin (LIPITOR) 80 MG tablet Take 1 tablet (80 mg total) by mouth daily. 05/07/22   Gust Rung, DO  cetirizine (ZYRTEC ALLERGY) 10 MG tablet Take 1 tablet (10 mg total) by mouth daily. 01/15/22 01/15/23  Merrilyn Puma, MD  ezetimibe (ZETIA) 10 MG tablet Take 1 tablet by mouth once daily 07/20/22   Gust Rung, DO  fluticasone (FLONASE) 50 MCG/ACT nasal spray Place 1 spray into both nostrils daily. 07/23/22 07/23/23  Mapp, Gaylyn Cheers, MD  metoprolol succinate (TOPROL-XL) 50 MG 24 hr tablet Take 1 tablet (50 mg total) by mouth daily. Take with or immediately following  a meal. 09/24/22   Mikey Bussing, Marthenia Rolling, DO  nicotine (NICODERM CQ - DOSED IN MG/24 HOURS) 21 mg/24hr patch Place 1 patch (21 mg total) onto the skin daily. Patient not taking: Reported on 11/02/2022 06/24/22   Merrilyn Puma, MD  nicotine polacrilex (NICORETTE) 4 MG gum Take 1 each (4 mg total) by mouth as needed for smoking cessation. Patient not taking: Reported on 11/02/2022 06/24/22   Merrilyn Puma, MD  senna-docusate (SENOKOT-S) 8.6-50 MG tablet Take 1 tablet by mouth at bedtime. 05/14/22   Merrilee Jansky, MD  spironolactone (ALDACTONE) 25 MG tablet Take 1 tablet (25 mg total) by mouth daily. 10/22/22   Gust Rung, DO  ticagrelor (BRILINTA) 90 MG TABS tablet Take 1 tablet (90 mg total) by mouth 2 (two) times daily. 11/04/22   Gust Rung, DO  umeclidinium bromide (INCRUSE ELLIPTA) 62.5 MCG/ACT AEPB Inhale 1 puff into the lungs daily. 09/24/22   Gust Rung, DO      Allergies    Ace inhibitors    Review of Systems   Review of Systems  Physical Exam Updated Vital Signs BP (!) 164/113   Pulse 66   Temp 97.6 F (36.4 C) (Oral)   Resp 16   Ht 6' 1.25" (1.861 m)   Wt 68 kg   SpO2 100%   BMI 19.66 kg/m  Physical Exam  ED Results / Procedures / Treatments   Labs (all labs ordered are  listed, but only abnormal results are displayed) Labs Reviewed  BASIC METABOLIC PANEL - Abnormal; Notable for the following components:      Result Value   Potassium 3.1 (*)    Glucose, Bld 125 (*)    All other components within normal limits  CBC - Abnormal; Notable for the following components:   RDW 16.2 (*)    All other components within normal limits  TROPONIN I (HIGH SENSITIVITY) - Abnormal; Notable for the following components:   Troponin I (High Sensitivity) 626 (*)    All other components within normal limits  TROPONIN I (HIGH SENSITIVITY) - Abnormal; Notable for the following components:   Troponin I (High Sensitivity) 1,678 (*)    All other components within normal limits   LIPID PANEL  HEPARIN LEVEL (UNFRACTIONATED)    EKG EKG Interpretation Date/Time:  Sunday November 15 2022 09:35:05 EDT Ventricular Rate:  61 PR Interval:  173 QRS Duration:  84 QT Interval:  517 QTC Calculation: 521 R Axis:   83  Text Interpretation: Sinus or ectopic atrial rhythm Borderline right axis deviation Prolonged QT interval Confirmed by Alvester Chou [phone]) on 11/15/2022 9:37:06 AM  Radiology DG Chest 2 View  Result Date: 11/15/2022 CLINICAL DATA:  72 year old male with history of chest pain. EXAM: CHEST - 2 VIEW COMPARISON:  Chest x-ray 08/01/2020. FINDINGS: Lung volumes are increased with advanced emphysematous changes. No consolidative airspace disease. No pleural effusions. No pneumothorax. No pulmonary nodule or mass noted. Pulmonary vasculature and the cardiomediastinal silhouette are within normal limits. Atherosclerosis in the thoracic aorta. Left-sided pacemaker device in place with lead tips projecting over the expected location of the right atrium and right ventricle. IMPRESSION: 1. No radiographic evidence of acute cardiopulmonary disease. 2. Emphysema. 3. Aortic atherosclerosis. Electronically Signed   By: Trudie Reed M.D.   On: 11/15/2022 09:07    Procedures .Critical Care  Performed by: Terald Sleeper, MD Authorized by: Terald Sleeper, MD   Critical care provider statement:    Critical care time (minutes):  40   Critical care time was exclusive of:  Separately billable procedures and treating other patients   Critical care was necessary to treat or prevent imminent or life-threatening deterioration of the following conditions:  Circulatory failure   Critical care was time spent personally by me on the following activities:  Ordering and performing treatments and interventions, ordering and review of laboratory studies, ordering and review of radiographic studies, pulse oximetry, review of old charts, examination of patient and evaluation of  patient's response to treatment   Care discussed with: admitting provider   Comments:     Heparin for NSTEMI     Medications Ordered in ED Medications  aspirin tablet 325 mg (325 mg Oral Given 11/15/22 1004)  nitroGLYCERIN (NITROSTAT) SL tablet 0.4 mg (0.4 mg Sublingual Given 11/15/22 1005)  heparin ADULT infusion 100 units/mL (25000 units/255mL) (850 Units/hr Intravenous New Bag/Given 11/15/22 1116)  ezetimibe (ZETIA) tablet 10 mg (has no administration in time range)  atorvastatin (LIPITOR) tablet 80 mg (has no administration in time range)  ticagrelor (BRILINTA) tablet 180 mg (has no administration in time range)  heparin bolus via infusion 4,000 Units (4,000 Units Intravenous Bolus from Bag 11/15/22 1116)    ED Course/ Medical Decision Making/ A&P Clinical Course as of 11/15/22 1150  Sun Nov 15, 2022  0939 Repeat ecg stable, no ischemic changes [MT]  0947 Dr Eden Emms cardiologist recommending medical admission, IV heparin, cardiology will consult regarding possible intervention [MT]  1018 Admitted to IM service [MT]  1018 Discussed BP with IM service - it is elevated here but will need reassessment after SL nitro [MT]    Clinical Course User Index [MT] Kylani Wires, Kermit Balo, MD                                 Medical Decision Making Amount and/or Complexity of Data Reviewed Labs: ordered. Radiology: ordered.  Risk OTC drugs. Prescription drug management. Decision regarding hospitalization.   This patient presents to the Emergency Department with complaint of chest pain. This involves an extensive number of treatment options, and is a complaint that carries with it a high risk of complications and morbidity, given the patient's comorbidity, including HTN, HLD, smoking .The differential diagnosis includes ACS vs Pneumothorax vs Reflux/Gastritis vs MSK pain vs Pneumonia vs other.  I felt PE was less likely given that the patient has no tachycardia or hypoxia, no acute PE risk  factors  I ordered, reviewed, and interpreted labs.  Pertinent results include elevated troponin 626 -> 1678 .  K 3.1.  Cr wnl.  WBC wnl. I ordered medication aspirin, sublingual nitroglycerin for chest pain.  IV heparin for suspected NSTEMI, possible coronary disease I ordered imaging studies which included x-ray of the chest I independently visualized and interpreted imaging which showed no emergent findings and the monitor tracing which showed short paced rhythm. I agree with the radiologist interpretation I personally reviewed the patients ECG which showed atrial paced rhythm, no STEMI  Cardiology was consulted, recommending -see ED course           Final Clinical Impression(s) / ED Diagnoses Final diagnoses:  NSTEMI (non-ST elevated myocardial infarction) [facility])    Rx / DC Orders ED Discharge Orders     None         Terald Sleeper, MD 11/15/22 1150

## 2022-11-15 NOTE — Plan of Care (Signed)

## 2022-11-15 NOTE — Progress Notes (Signed)
ANTICOAGULATION CONSULT NOTE - Initial Consult  Pharmacy Consult for heparin Indication: chest pain/ACS  Allergies  Allergen Reactions   Ace Inhibitors Swelling and Other (See Comments)    Patient presented with right upper lip swelling along with tingling and numbness. Possible allergic reaction to ACEI.    Patient Measurements:   Heparin Dosing Weight: TBW  Vital Signs: Temp: 97.6 F (36.4 C) (09/08 0810) Temp Source: Oral (09/08 0810) BP: 197/122 (09/08 1003) Pulse Rate: 60 (09/08 1003)  Labs: Recent Labs    11/15/22 0812  HGB 13.3  HCT 39.3  PLT 216  CREATININE 1.05  TROPONINIHS 626*    Estimated Creatinine Clearance: 60.3 mL/min (by C-G formula based on SCr of 1.05 mg/dL).   Medical History: Past Medical History:  Diagnosis Date   Atrial tachycardia 04/11/2012   Bilateral flank pain 06/26/2020   CEREBROVASCULAR ACCIDENT 10/17/2008   Acute CVA of right thalamus 10/03/08 ECHO performed 2/2 CVA 10/09/08, EF 55-65%  Aspirin 325 mg daily and try to work on quitting smoking.    Emphysema    per multiple CXRs   Hypertension    goal < 140/90   Sickle cell trait (HCC)    Snoring    Sleep study 09/02/10 was WNL   Stroke [facility]) 10/03/2008   Right thalamus   TIA (transient ischemic attack) 12/25/2021   Tuberculosis at age [age]    Assessment: 72 YOM presenting with CP, elevated troponin, he is not on anticoagulation PTA, CBC wnl  Goal of Therapy:  Heparin level 0.3-0.7 units/ml Monitor platelets by anticoagulation protocol: Yes   Plan:  Heparin 4000 units IV x 1, and gtt at 850 units/hr F/u 8 hour heparin level F/u cards eval and recs  Daylene Posey, PharmD, [facility] Clinical Pharmacist ED Pharmacist Phone # [phone] 11/15/2022 10:11 AM

## 2022-11-15 NOTE — ED Notes (Signed)
Transported to xray 

## 2022-11-15 NOTE — H&P (Cosign Needed Addendum)
Date: 11/15/2022               Patient Name:  [name] MRN: [id number]  DOB: [birth year] Age / Sex: 72 y.o., male   PCP: Gust Rung, DO         Medical Service: Internal Medicine Teaching Service         Attending Physician: Dr. Reymundo Poll, MD      First Contact: Dr. Carmina Miller, DO Pager [phone]    Second Contact: Dr. Marrianne Mood, MD Pager [phone]         After Hours (After 5p/  First Contact Pager: [phone]  weekends / holidays): Second Contact Pager: [phone]   SUBJECTIVE   Chief Complaint: Chest Pain  History of Present Illness: [name]. Reyes is a 72 year old male with PMH of ischemic stroke Craig/p thrombectomy/stent placement (05/2022), symptomatic bradycardia Craig/p Medtronic dual-chamber pacemaker, HTN, HLD, COPD, tobacco use (50+ pack-year history), and crack-cocaine abuse who presents to the ED with left-sided chest pain. Patient states he woke up around 5 AM and went to use the restroom when the chest pain first developed. Described as "25 out of 10", sharp, non-pleuritic with radiation to the left shoulder. Also with associated diaphoresis, n/v, and relieved with nitroglycerin on ED arrival. Patient without prior history of this pain. Patient endorses history of crack-cocaine use and states he last used a few months ago, however when leaving the room, patient'Craig daughter states she is "positive" he has used in days leading up to admission. Patient denies fever, chills, SOB, cough, palpitations, abdominal pain, or change in urinary/bowel habits. Furthermore, patient states he does not take chronic medications frequently, "when I remember".  ED Course: ECG unremarkable but Troponin on arrival was 626 (8:12 am) > 1,678 > 24,000 (as of 6:30 pm). See other labs below. Chest X-ray unremarkable aside from Emphysema. Cardiology consulted (see below).  Meds:  Current Meds  Medication Sig   albuterol (VENTOLIN HFA) 108 (90 Base) MCG/ACT inhaler INHALE 2 PUFFS BY  MOUTH EVERY 6 HOURS AS NEEDED FOR WHEEZING OR SHORTNESS OF BREATH   amLODipine (NORVASC) 5 MG tablet Take 1 tablet (5 mg total) by mouth daily.   aspirin EC 81 MG tablet Take 1 tablet (81 mg total) by mouth daily. Swallow whole.   atorvastatin (LIPITOR) 80 MG tablet Take 1 tablet (80 mg total) by mouth daily.   ezetimibe (ZETIA) 10 MG tablet Take 1 tablet by mouth once daily   fluticasone (FLONASE) 50 MCG/ACT nasal spray Place 1 spray into both nostrils daily.   metoprolol succinate (TOPROL-XL) 50 MG 24 hr tablet Take 1 tablet (50 mg total) by mouth daily. Take with or immediately following a meal.   spironolactone (ALDACTONE) 25 MG tablet Take 1 tablet (25 mg total) by mouth daily.   umeclidinium bromide (INCRUSE ELLIPTA) 62.5 MCG/ACT AEPB Inhale 1 puff into the lungs daily.    Past Medical History  Past Surgical History:  Procedure Laterality Date   APPENDECTOMY  1977   IR ANGIO INTRA EXTRACRAN SEL COM CAROTID INNOMINATE UNI L MOD SED  11/12/2022   IR ANGIO INTRA EXTRACRAN SEL INTERNAL CAROTID UNI R MOD SED  11/12/2022   IR ANGIO VERTEBRAL SEL VERTEBRAL UNI R MOD SED  11/12/2022   IR CT HEAD LTD  04/23/2022   IR CT HEAD LTD  04/23/2022   IR PERCUTANEOUS ART THROMBECTOMY/INFUSION INTRACRANIAL INC DIAG ANGIO  04/23/2022   IR PTA INTRACRANIAL  04/23/2022   IR US GUIDE VASC ACCESS  RIGHT  04/23/2022   IR US GUIDE VASC ACCESS RIGHT  04/23/2022   IR US GUIDE VASC ACCESS RIGHT  11/12/2022   PACEMAKER INSERTION  07/24/08   MDT Adapta L implanted by Dr Fredrich Birks   RADIOLOGY WITH ANESTHESIA N/A 04/23/2022   Procedure: IR WITH ANESTHESIA;  Surgeon: Radiologist, Medication, MD;  Location: [facility] OR;  Service: Radiology;  Laterality: N/A;   VASECTOMY  1990    Allergies: Allergies as of 11/15/2022 - Review Complete 11/15/2022  Allergen Reaction Noted   Ace inhibitors Swelling and Other (See Comments) 07/26/2013    Review of Systems: A complete ROS was negative except as per HPI.   OBJECTIVE:   Physical  Exam: Blood pressure (!) 164/113, pulse 66, temperature 97.6 F (36.4 C), temperature source Oral, resp. rate 16, height 6' 1.25" (1.861 m), weight 68 kg, SpO2 100%.   Constitutional: well-appearing, lying in bed, in no acute distress Cardiovascular: regular rate and rhythm, no m/r/g, no LEE Pulmonary/Chest: normal work of breathing on room air, lungs clear to auscultation bilaterally Abdominal: soft, non-tender, non-distended MSK: normal bulk and tone Skin: warm and dry Psych: normal mood and behavior   Labs: CBC    Component Value Date/Time   WBC 5.4 11/15/2022 0812   RBC 4.62 11/15/2022 0812   HGB 13.3 11/15/2022 0812   HGB 12.3 (L) 10/22/2022 1008   HCT 39.3 11/15/2022 0812   HCT 37.8 10/22/2022 1008   PLT 216 11/15/2022 0812   PLT 235 10/22/2022 1008   MCV 85.1 11/15/2022 0812   MCV 87 10/22/2022 1008   MCH 28.8 11/15/2022 0812   MCHC 33.8 11/15/2022 0812   RDW 16.2 (H) 11/15/2022 0812   RDW 16.4 (H) 10/22/2022 1008   LYMPHSABS 1.1 04/23/2022 1428   MONOABS 0.2 04/23/2022 1428   EOSABS 0.0 04/23/2022 1428   BASOSABS 0.0 04/23/2022 1428     CMP     Component Value Date/Time   NA 141 11/15/2022 0812   NA 143 10/22/2022 1008   K 3.1 (L) 11/15/2022 0812   CL 102 11/15/2022 0812   CO2 26 11/15/2022 0812   GLUCOSE 125 (H) 11/15/2022 0812   BUN 8 11/15/2022 0812   BUN 10 10/22/2022 1008   CREATININE 1.05 11/15/2022 0812   CREATININE 0.90 07/19/2014 1401   CALCIUM 9.5 11/15/2022 0812   PROT 7.4 04/23/2022 1428   PROT 6.8 12/25/2021 1010   ALBUMIN 3.8 04/23/2022 1428   ALBUMIN 4.4 12/25/2021 1010   AST 31 04/23/2022 1428   ALT 28 04/23/2022 1428   ALKPHOS 110 04/23/2022 1428   BILITOT 0.5 04/23/2022 1428   BILITOT 0.5 12/25/2021 1010   GFRNONAA >60 11/15/2022 0812   GFRNONAA 89 07/19/2014 1401   GFRAA 89 12/27/2019 1453   GFRAA >89 07/19/2014 1401     ASSESSMENT & PLAN:   Assessment & Plan by Problem: Principal Problem:   Chest pain with high risk for  cardiac etiology Active Problems:   Essential hypertension   COPD (chronic obstructive pulmonary disease) (HCC)   Sinoatrial node dysfunction (HCC)   History of CVA (cerebrovascular accident)   Paroxysmal atrial fibrillation (HCC)   Cocaine use disorder (HCC)   [name] is a 72 y.o. male with PMH of ischemic stroke Craig/p thrombectomy/stent placement (05/2022), symptomatic bradycardia Craig/p Medtronic dual-chamber pacemaker, HTN, HLD, COPD, tobacco use (50+ pack-year history), and crack-cocaine abuse who presents to the ED on 9/8 with left-sided chest pain.  #NSTEMI Chest pain resolved at present. Given patient'Craig description,  unremarkable ECG, and troponin with significant elevation, suspect this is an NSTEMI. Patient endorses past history of smoking crack-cocaine but daughter privately states patient has been smoking crack leading up to admission. However, suspect unstable plaque rather than vasospasm. Cardiology consulted and started heparin (per pharmacy) - planning for heart cath. tomorrow. -Start ASA, Lipitor, Lopressor -Patient refused Brilinta earlier; will try again tonight for loading dose of 180 then 90/daily -Nitroglycerin prn -Monitor telemetry -Appreciate Cardiology recommendations -Drug counseling  #HTN BP'Craig 150-180'Craig/110-130'Craig; patient asymptomatic aside from chest pain -Start home Amlodipine 5 -Start Lopressor 12.5 BID -Hydralazine prn -Trend vitals  #HLD -Start home Lipitor 80 and Zetia 10 -LDL on admission is 95; above goal for secondary prevention  #COPD #Tobacco use Unremarkable physical exam. Emphysema noted on Chest X-ray. -Start home Incruse Elipta -Albuterol nebulizer prn -Low-dose lung CT 07/2020 notes Lung-RAD 2, benign appearance and recommended follow-up scan in 12 months -Tobacco cessation counseling   #Cocaine Use Disorder Increases risk for ACS, but suspect current syndrome is unstable plaque rather than vasospasm.  #Hypokalemia 3.1 on  admission. Klor-Con 40 mEq given. -Trend BMP -Check Mg  Diet: Regular, NPO @ midnight VTE: Heparin IVF: N/A Code:  DNR-Limited   Dispo: Admit patient to Inpatient with expected length of stay greater than 2 midnights.  Signed: Carmina Miller, DO Internal Medicine Resident PGY-1 11/15/2022, 8:28 PM

## 2022-11-15 NOTE — ED Triage Notes (Signed)
Pt reports central chest pain that started at 5am this morning. Pt also c.o nausea

## 2022-11-15 NOTE — ED Notes (Signed)
ED TO INPATIENT HANDOFF REPORT  ED Nurse Name and Phone #: Carollee Herter [phone]  S Name/Age/Gender [name] 72 y.o. male Room/Bed: 016C/016C  Code Status   Code Status: Limited: Do not attempt resuscitation (DNR) -DNR-LIMITED -Do Not Intubate/DNI   Home/SNF/Other Home Patient oriented to: self, place, time, and situation Is this baseline? Yes   Triage Complete: Triage complete  Chief Complaint Chest pain with high risk for cardiac etiology [R07.9]  Triage Note Pt reports central chest pain that started at 5am this morning. Pt also c.o nausea   Allergies Allergies  Allergen Reactions   Ace Inhibitors Swelling and Other (See Comments)    Patient presented with right upper lip swelling along with tingling and numbness. Possible allergic reaction to ACEI.    Level of Care/Admitting Diagnosis ED Disposition     ED Disposition  Admit   Condition  --   Comment  Hospital [facility]: [facility] [[id number]]  Level of Care: Progressive [102]  Admit to Progressive based on following criteria: CARDIOVASCULAR & THORACIC of moderate stability with acute coronary syndrome symptoms/low risk myocardial infarction/hypertensive urgency/arrhythmias/heart failure potentially compromising stability and stable post cardiovascular intervention patients.  May admit patient to [facility] or [facility] if equivalent level of care is available:: No  Covid Evaluation: Asymptomatic - no recent exposure (last 10 days) testing not required  Diagnosis: Chest pain with high risk for cardiac etiology [585277]  Admitting Physician: Dickie La [[id number]]  Attending Physician: Dickie La [[id number]]  Certification:: I certify this patient will need inpatient services for at least 2 midnights  Expected Medical Readiness: 11/17/2022          B Medical/Surgery History Past Medical History:  Diagnosis Date   Atrial tachycardia 04/11/2012   Bilateral flank pain 06/26/2020    CEREBROVASCULAR ACCIDENT 10/17/2008   Acute CVA of right thalamus 10/03/08 ECHO performed 2/2 CVA 10/09/08, EF 55-65%  Aspirin 325 mg daily and try to work on quitting smoking.    Emphysema    per multiple CXRs   Hypertension    goal < 140/90   Sickle cell trait (HCC)    Snoring    Sleep study 09/02/10 was WNL   Stroke (HCC) 10/03/2008   Right thalamus   TIA (transient ischemic attack) 12/25/2021   Tuberculosis at age 61-3   Past Surgical History:  Procedure Laterality Date   APPENDECTOMY  1977   IR ANGIO INTRA EXTRACRAN SEL COM CAROTID INNOMINATE UNI L MOD SED  11/12/2022   IR ANGIO INTRA EXTRACRAN SEL INTERNAL CAROTID UNI R MOD SED  11/12/2022   IR ANGIO VERTEBRAL SEL VERTEBRAL UNI R MOD SED  11/12/2022   IR CT HEAD LTD  04/23/2022   IR CT HEAD LTD  04/23/2022   IR PERCUTANEOUS ART THROMBECTOMY/INFUSION INTRACRANIAL INC DIAG ANGIO  04/23/2022   IR PTA INTRACRANIAL  04/23/2022   IR US GUIDE VASC ACCESS RIGHT  04/23/2022   IR US GUIDE VASC ACCESS RIGHT  04/23/2022   IR US GUIDE VASC ACCESS RIGHT  11/12/2022   PACEMAKER INSERTION  07/24/08   MDT Adapta L implanted by Dr Fredrich Birks   RADIOLOGY WITH ANESTHESIA N/A 04/23/2022   Procedure: IR WITH ANESTHESIA;  Surgeon: Radiologist, Medication, MD;  Location: [facility] OR;  Service: Radiology;  Laterality: N/A;   VASECTOMY  1990     A IV Location/Drains/Wounds Patient Lines/Drains/Airways Status     Active Line/Drains/Airways     Name Placement date Placement time Site Days   Peripheral IV  11/15/22 20 G Right Antecubital 11/15/22  1045  Antecubital  less than 1            Intake/Output Last 24 hours No intake or output data in the 24 hours ending 11/15/22 1304  Labs/Imaging Results for orders placed or performed during the hospital encounter of 11/15/22 (from the past 48 hour(s))  Basic metabolic panel     Status: Abnormal   Collection Time: 11/15/22  8:12 AM  Result Value Ref Range   Sodium 141 135 - 145 mmol/L   Potassium 3.1 (L) 3.5 - 5.1  mmol/L   Chloride 102 98 - 111 mmol/L   CO2 26 22 - 32 mmol/L   Glucose, Bld 125 (H) 70 - 99 mg/dL    Comment: Glucose reference range applies only to samples taken after fasting for at least 8 hours.   BUN 8 8 - 23 mg/dL   Creatinine, Ser 1.61 0.61 - 1.24 mg/dL   Calcium 9.5 8.9 - 09.6 mg/dL   GFR, Estimated >04 >54 mL/min    Comment: (NOTE) Calculated using the CKD-EPI Creatinine Equation (2021)    Anion gap 13 5 - 15    Comment: Performed at [facility], [address], [place], [place] [address]  CBC     Status: Abnormal   Collection Time: 11/15/22  8:12 AM  Result Value Ref Range   WBC 5.4 4.0 - 10.5 K/uL   RBC 4.62 4.22 - 5.81 MIL/uL   Hemoglobin 13.3 13.0 - 17.0 g/dL   HCT 91.4 78.2 - 95.6 %   MCV 85.1 80.0 - 100.0 fL   MCH 28.8 26.0 - 34.0 pg   MCHC 33.8 30.0 - 36.0 g/dL   RDW 21.3 (H) 08.6 - 57.8 %   Platelets 216 150 - 400 K/uL   nRBC 0.0 0.0 - 0.2 %    Comment: Performed at [facility], [address], [place], [place] [address]  Troponin I (High Sensitivity)     Status: Abnormal   Collection Time: 11/15/22  8:12 AM  Result Value Ref Range   Troponin I (High Sensitivity) 626 (HH) <18 ng/L    Comment: CRITICAL RESULT CALLED TO, READ BACK BY AND VERIFIED WITH S,Ascencion Coye RN @0922  11/15/22 E,[place] (NOTE) Elevated high sensitivity troponin I (hsTnI) values and significant  changes across serial measurements may suggest ACS but many other  chronic and acute conditions are known to elevate hsTnI results.  Refer to the "Links" section for chest pain algorithms and additional  guidance. Performed at [facility], [address] [place], [place] [address]   Lipid panel     Status: None   Collection Time: 11/15/22 10:20 AM  Result Value Ref Range   Cholesterol 163 0 - 200 mg/dL   Triglycerides 49 <132 mg/dL   HDL 58 >44 mg/dL   Total CHOL/HDL Ratio 2.8 RATIO   VLDL 10 0 - 40 mg/dL   LDL Cholesterol 95 0 - 99 mg/dL    Comment:        Total  Cholesterol/HDL:CHD Risk Coronary Heart Disease Risk Table                     Men   Women  1/2 Average Risk   3.4   3.3  Average Risk       5.0   4.4  2 X Average Risk   9.6   7.1  3 X Average Risk  23.4  11.0        Use the calculated Patient Ratio above and the CHD Risk Table to determine the patient's CHD Risk.        ATP III CLASSIFICATION (LDL):  <100     mg/dL   Optimal  161-096  mg/dL   Near or Above                    Optimal  130-159  mg/dL   Borderline  045-409  mg/dL   High  >811     mg/dL   Very High Performed at [facility], [address], [place], [place] [address]   Troponin I (High Sensitivity)     Status: Abnormal   Collection Time: 11/15/22 10:20 AM  Result Value Ref Range   Troponin I (High Sensitivity) 1,678 (HH) <18 ng/L    Comment: CRITICAL VALUE NOTED. VALUE IS CONSISTENT WITH PREVIOUSLY REPORTED/CALLED VALUE DELTA CHECK NOTED (NOTE) Elevated high sensitivity troponin I (hsTnI) values and significant  changes across serial measurements may suggest ACS but many other  chronic and acute conditions are known to elevate hsTnI results.  Refer to the "Links" section for chest pain algorithms and additional  guidance. Performed at [facility], [address], [place], [place] [address]    DG Chest 2 View  Result Date: 11/15/2022 CLINICAL DATA:  72 year old male with history of chest pain. EXAM: CHEST - 2 VIEW COMPARISON:  Chest x-ray 08/01/2020. FINDINGS: Lung volumes are increased with advanced emphysematous changes. No consolidative airspace disease. No pleural effusions. No pneumothorax. No pulmonary nodule or mass noted. Pulmonary vasculature and the cardiomediastinal silhouette are within normal limits. Atherosclerosis in the thoracic aorta. Left-sided pacemaker device in place with lead tips projecting over the expected location of the right atrium and right ventricle. IMPRESSION: 1. No radiographic evidence of acute cardiopulmonary  disease. 2. Emphysema. 3. Aortic atherosclerosis. Electronically Signed   By: Trudie Reed M.D.   On: 11/15/2022 09:07    Pending Labs Unresulted Labs (From admission, onward)     Start     Ordered   11/16/22 0500  Heparin level (unfractionated)  Daily,   R     Placed in "And" Linked Group   11/15/22 1011   11/16/22 0500  CBC  Daily,   R     Placed in "And" Linked Group   11/15/22 1011   11/15/22 1800  Heparin level (unfractionated)  Once-Timed,   URGENT        11/15/22 1011            Vitals/Pain Today's Vitals   11/15/22 1018 11/15/22 1213 11/15/22 1215 11/15/22 1218  BP:   (!) 172/110   Pulse:   64   Resp:   16   Temp:    97.6 F (36.4 C)  TempSrc:    Oral  SpO2:   100%   Weight: 68 kg     Height: 6' 1.25" (1.861 m)     PainSc:  0-No pain      Isolation Precautions No active isolations  Medications Medications  aspirin tablet 325 mg (325 mg Oral Given 11/15/22 1004)  nitroGLYCERIN (NITROSTAT) SL tablet 0.4 mg (0.4 mg Sublingual Given 11/15/22 1005)  heparin ADULT infusion 100 units/mL (25000 units/226mL) (850 Units/hr Intravenous New Bag/Given 11/15/22 1116)  ezetimibe (ZETIA) tablet 10 mg (has no administration in time range)  atorvastatin (LIPITOR) tablet 80 mg (80 mg Oral Given 11/15/22 1211)  ticagrelor (BRILINTA) tablet 180  mg (180 mg Oral Patient Refused/Not Given 11/15/22 1210)  heparin bolus via infusion 4,000 Units (4,000 Units Intravenous Bolus from Bag 11/15/22 1116)    Mobility walks     Focused Assessments    R Recommendations: See Admitting Provider Note  Report given to:   Additional Notes: Family at bedside. Pt may want to change code status.

## 2022-11-15 NOTE — Consult Note (Signed)
CARDIOLOGY CONSULT NOTE       Patient ID: [name] MRN: [id number] DOB/AGE: 06-05-1950 72 y.o.  Admit date: 11/15/2022 Referring Physician: Renaye Rakers Primary Physician: Gust Rung, DO Primary Cardiologist: Mealor Reason for Consultation: Chest pain  Active Problems:   * No active hospital problems. *   HPI:  72 y.o. seen in ED at request of Dr Renaye Rakers for chest pain. Patient has no history of CAD. History of HTN, SSS with PPM and recent CVA  11/13/22 had slurred speech and left sided weakness with proximal right M1/MCA occlusion with successful mechanical thrombectomy with stenting of right MCA He also has remote lacunar infarcts CT with no bleed and was Rx with ASA/Brilinta. [place] Noted more dyspnea last 3 months ( this anteceded Brilinta use ) Last night had some grits / eggs and started having SSCP/pressure. Awoke him from sleep at 5:00 am Burning pressure in chest associated with nausea and dry heaves Currently resolved. In ER ECG no acute changes Initial troponin 626.    ROS All other systems reviewed and negative except as noted above  Past Medical History:  Diagnosis Date   Atrial tachycardia 04/11/2012   Bilateral flank pain 06/26/2020   CEREBROVASCULAR ACCIDENT 10/17/2008   Acute CVA of right thalamus 10/03/08 ECHO performed 2/2 CVA 10/09/08, EF 55-65%  Aspirin 325 mg daily and try to work on quitting smoking.    Emphysema    per multiple CXRs   Hypertension    goal < 140/90   Sickle cell trait (HCC)    Snoring    Sleep study 09/02/10 was WNL   Stroke [facility]) 10/03/2008   Right thalamus   TIA (transient ischemic attack) 12/25/2021   Tuberculosis at age [age]    Family History  Problem Relation Age of Onset   Hypertension Mother    Cancer Sister        Unknown   Hypertension Sister    Diabetes Sister    Cancer Sister    Sickle cell trait Son    Sickle cell anemia Son     Social History   Socioeconomic History    Marital status: Married    Spouse name: Not on file   Number of children: 6   Years of education: 15   Highest education level: Not on file  Occupational History   Occupation: Disabled    Comment: PT Administrator at [employer]  Tobacco Use   Smoking status: Every Day    Current packs/day: 0.10    Average packs/day: 0.1 packs/day for 10.0 years (1.0 ttl pk-yrs)    Types: Cigarettes   Smokeless tobacco: Never   Tobacco comments:    2 cigs daily  Vaping Use   Vaping status: Former  Substance and Sexual Activity   Alcohol use: No    Alcohol/week: 0.0 standard drinks of alcohol   Drug use: No    Comment: previous polysubstance abuser, quit x 13 years   Sexual activity: Yes    Birth control/protection: Condom  Other Topics Concern   Not on file  Social History Narrative   Current Social History 06/29/2019        Patient lives with spouse in a ground floor apartment which is 1 story. There are not steps up to the entrance the patient uses.       Patient's method of transportation is personal car.      The highest level of education was Bachelor's Degree; now working on [employer].  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      Identified important Relationships are "My wife"      Pets : None       Interests / Fun: Computers, "I'm an Chief Technology Officer."       Current Stressors: "I hardly let anything bother me. I walk away"       Religious / Personal Beliefs: Pent[employer]al Holiness       L. Ducatte, BSN, RN-BC       Social Determinants of Health   Financial Resource Strain: Medium Risk (01/15/2022)   Overall Financial Resource Strain (CARDIA)    Difficulty of Paying Living Expenses: Somewhat hard  Food Insecurity: No Food Insecurity (04/07/2022)   Hunger Vital Sign    Worried About Running Out of Food in the Last Year: Never true    Ran Out of Food in the Last Year: Never true  Recent Concern: Food  Insecurity - Food Insecurity Present (01/15/2022)   Hunger Vital Sign    Worried About Running Out of Food in the Last Year: Often true    Ran Out of Food in the Last Year: Sometimes true  Transportation Needs: No Transportation Needs (04/07/2022)   PRAPARE - Administrator, Civil Service (Medical): No    Lack of Transportation (Non-Medical): No  Physical Activity: Insufficiently Active (01/15/2022)   Exercise Vital Sign    Days of Exercise per Week: 3 days    Minutes of Exercise per Session: 20 min  Stress: Stress Concern Present (01/15/2022)   [employer] of Occupational Health - Occupational Stress Questionnaire    Feeling of Stress : To some extent  Social Connections: Socially Integrated (01/15/2022)   Social Connection and Isolation Panel [NHANES]    Frequency of Communication with Friends and Family: More than three times a week    Frequency of Social Gatherings with Friends and Family: Once a week    Attends Religious Services: More than 4 times per year    Active Member of [employer] or Organizations: Yes    Attends Banker Meetings: Never    Marital Status: Married  Catering manager Violence: Not At Risk (04/07/2022)   Humiliation, Afraid, Rape, and Kick questionnaire    Fear of Current or Ex-Partner: No    Emotionally Abused: No    Physically Abused: No    Sexually Abused: No    Past Surgical History:  Procedure Laterality Date   APPENDECTOMY  1977   IR ANGIO INTRA EXTRACRAN SEL COM CAROTID INNOMINATE UNI L MOD SED  11/12/2022   IR ANGIO INTRA EXTRACRAN SEL INTERNAL CAROTID UNI R MOD SED  11/12/2022   IR ANGIO VERTEBRAL SEL VERTEBRAL UNI R MOD SED  11/12/2022   IR CT HEAD LTD  04/23/2022   IR CT HEAD LTD  04/23/2022   IR PERCUTANEOUS ART THROMBECTOMY/INFUSION INTRACRANIAL INC DIAG ANGIO  04/23/2022   IR PTA INTRACRANIAL  04/23/2022   IR US GUIDE VASC ACCESS RIGHT  04/23/2022   IR US GUIDE VASC ACCESS RIGHT  04/23/2022   IR US GUIDE VASC ACCESS RIGHT   11/12/2022   PACEMAKER INSERTION  07/24/08   MDT Adapta L implanted by Dr Fredrich Birks   RADIOLOGY WITH ANESTHESIA N/A 04/23/2022   Procedure: IR WITH ANESTHESIA;  Surgeon: Radiologist, Medication, MD;  Location: [facility] OR;  Service: Radiology;  Laterality: N/A;   VASECTOMY  1990      Current Facility-Administered  Medications:    aspirin tablet 325 mg, 325 mg, Oral, Daily, Trifan, Kermit Balo, MD, 325 mg at 11/15/22 1004   nitroGLYCERIN (NITROSTAT) SL tablet 0.4 mg, 0.4 mg, Sublingual, Q5 min PRN, Renaye Rakers, Kermit Balo, MD, 0.4 mg at 11/15/22 1005  Current Outpatient Medications:    albuterol (VENTOLIN HFA) 108 (90 Base) MCG/ACT inhaler, INHALE 2 PUFFS BY MOUTH EVERY 6 HOURS AS NEEDED FOR WHEEZING OR SHORTNESS OF BREATH, Disp: 18 g, Rfl: 0   amLODipine (NORVASC) 5 MG tablet, Take 1 tablet (5 mg total) by mouth daily., Disp: 90 tablet, Rfl: 3   aspirin EC 81 MG tablet, Take 1 tablet (81 mg total) by mouth daily. Swallow whole., Disp: 30 tablet, Rfl: 12   atorvastatin (LIPITOR) 80 MG tablet, Take 1 tablet (80 mg total) by mouth daily., Disp: 90 tablet, Rfl: 3   cetirizine (ZYRTEC ALLERGY) 10 MG tablet, Take 1 tablet (10 mg total) by mouth daily., Disp: 30 tablet, Rfl: 2   ezetimibe (ZETIA) 10 MG tablet, Take 1 tablet by mouth once daily, Disp: 90 tablet, Rfl: 3   fluticasone (FLONASE) 50 MCG/ACT nasal spray, Place 1 spray into both nostrils daily., Disp: 9.9 mL, Rfl: 2   metoprolol succinate (TOPROL-XL) 50 MG 24 hr tablet, Take 1 tablet (50 mg total) by mouth daily. Take with or immediately following a meal., Disp: 90 tablet, Rfl: 3   nicotine (NICODERM CQ - DOSED IN MG/24 HOURS) 21 mg/24hr patch, Place 1 patch (21 mg total) onto the skin daily. (Patient not taking: Reported on 11/02/2022), Disp: 28 patch, Rfl: 0   nicotine polacrilex (NICORETTE) 4 MG gum, Take 1 each (4 mg total) by mouth as needed for smoking cessation. (Patient not taking: Reported on 11/02/2022), Disp: 100 tablet, Rfl: 1   senna-docusate  (SENOKOT-S) 8.6-50 MG tablet, Take 1 tablet by mouth at bedtime., Disp: 30 tablet, Rfl: 1   spironolactone (ALDACTONE) 25 MG tablet, Take 1 tablet (25 mg total) by mouth daily., Disp: 90 tablet, Rfl: 3   ticagrelor (BRILINTA) 90 MG TABS tablet, Take 1 tablet (90 mg total) by mouth 2 (two) times daily., Disp: 60 tablet, Rfl: 0   umeclidinium bromide (INCRUSE ELLIPTA) 62.5 MCG/ACT AEPB, Inhale 1 puff into the lungs daily., Disp: 90 each, Rfl: 2  aspirin  325 mg Oral Daily     Physical Exam: Blood pressure (!) 197/122, pulse 60, temperature 97.6 F (36.4 C), temperature source Oral, resp. rate 13, SpO2 100%.   Affect appropriate Black male no distress  HEENT: normal Neck supple with no adenopathy JVP normal no bruits no thyromegaly Lungs clear with no wheezing and good diaphragmatic motion Heart:  S1/S2 no murmur, no rub, gallop or click PMI normal  PPM under left clavicle Abdomen: benighn, BS positve, no tenderness, no AAA no bruit.  No HSM or HJR Distal pulses intact with no bruits No edema Neuro non-focal Skin warm and dry No muscular weakness   Labs:   Lab Results  Component Value Date   WBC 5.4 11/15/2022   HGB 13.3 11/15/2022   HCT 39.3 11/15/2022   MCV 85.1 11/15/2022   PLT 216 11/15/2022    Recent Labs  Lab 11/15/22 0812  NA 141  K 3.1*  CL 102  CO2 26  BUN 8  CREATININE 1.05  CALCIUM 9.5  GLUCOSE 125*   Lab Results  Component Value Date   CKTOTAL 81 09/14/2011   CKMB 1.4 09/14/2011   TROPONINI <0.30 09/14/2011    Lab Results  Component Value Date   CHOL 152 10/22/2022   CHOL 126 04/24/2022   CHOL 193 04/07/2022   Lab Results  Component Value Date   HDL 55 10/22/2022   HDL 33 (L) 04/24/2022   HDL 51 04/07/2022   Lab Results  Component Value Date   LDLCALC 82 10/22/2022   LDLCALC 82 04/24/2022   LDLCALC 128 (H) 04/07/2022   Lab Results  Component Value Date   TRIG 77 10/22/2022   TRIG 56 04/24/2022   TRIG 69 04/07/2022   Lab Results   Component Value Date   CHOLHDL 2.8 10/22/2022   CHOLHDL 3.8 04/24/2022   CHOLHDL 3.8 04/07/2022   No results found for: "LDLDIRECT"    Radiology: DG Chest 2 View  Result Date: 11/15/2022 CLINICAL DATA:  72 year old male with history of chest pain. EXAM: CHEST - 2 VIEW COMPARISON:  Chest x-ray 08/01/2020. FINDINGS: Lung volumes are increased with advanced emphysematous changes. No consolidative airspace disease. No pleural effusions. No pneumothorax. No pulmonary nodule or mass noted. Pulmonary vasculature and the cardiomediastinal silhouette are within normal limits. Atherosclerosis in the thoracic aorta. Left-sided pacemaker device in place with lead tips projecting over the expected location of the right atrium and right ventricle. IMPRESSION: 1. No radiographic evidence of acute cardiopulmonary disease. 2. Emphysema. 3. Aortic atherosclerosis. Electronically Signed   By: Trudie Reed M.D.   On: 11/15/2022 09:07   IR US Guide Vasc Access Right  Result Date: 11/13/2022 INDICATION: WLADIMIR AERNI is a 72 year old male who presented to the Emergency Department on 04/23/2022 with left-sided weakness; NIHSS 6. He was found to have a proximal right M1/MCA occlusion with underlying atherosclerotic stenosis. He underwent successful mechanical thrombectomy for treatment of the proximal right M1/MCA occlusion with placement of a drug eluting stent in his right MCA treatment of the underlying atherosclerotic high-grade stenosis. He has been taking an aspirin and Brilinta since stent placement. He comes today for diagnostic cerebral angiogram to evaluate stent patency. EXAM: ULTRASOUND-GUIDED VASCULAR ACCESS DIAGNOSTIC CEREBRAL ANGIOGRAM COMPARISON:  Cerebral angiogram intervention April 23, 2022. MEDICATIONS: 5,000 IU heparin, 5 mg Verapamil and 200 mcg nitroglycerin intra arterial 2 right radial artery. ANESTHESIA/SEDATION: Moderate (conscious) sedation was employed during this procedure. A total of  Versed 2 mg and Fentanyl 100 mcg was administered intravenously by the radiology nurse. Total intra-service moderate Sedation Time: 40 minutes. The patient's level of consciousness and vital signs were monitored continuously by radiology nursing throughout the procedure under my direct supervision. CONTRAST:  60 mL of Omnipaque 300 milligram/mL FLUOROSCOPY: Radiation Exposure Index (as provided by the fluoroscopic device): 359 mGy Kerma COMPLICATIONS: None immediate. TECHNIQUE: Informed written consent was obtained from the patient after a thorough discussion of the procedural risks, benefits and alternatives. All questions were addressed. Maximal Sterile Barrier Technique was utilized including caps, mask, sterile gowns, sterile gloves, sterile drape, hand hygiene and skin antiseptic. A timeout was performed prior to the initiation of the procedure. Using the modified Seldinger technique and a micropuncture kit, access was gained to the right radial artery at the wrist and a 5 French sheath was placed. Real-time ultrasound guidance was utilized for vascular access including the acquisition of a permanent ultrasound image documenting patency of the accessed vessel. Slow intra arterial infusion of 5,000 IU heparin, 5 mg Verapamil and 200 mcg nitroglycerin diluted in patient's own blood was performed. No significant fluctuation in patient's blood pressure seen. Then, a right radial artery angiogram was obtained via sheath side port. Normal brachial artery branching  pattern seen. No significant anatomical variation. The right radial artery caliber is adequate for vascular access. Next, a 5 [place] Simmons 2 glide catheter was navigated over a 0.035" Terumo Glidewire into the right subclavian artery under fluoroscopic guidance. Using road map guidance, the catheter was placed into the right vertebral artery. Frontal and lateral angiograms of the head were obtained. Next, the catheter was placed into the left common  carotid artery. Frontal and lateral angiograms of the neck were obtained followed by frontal and lateral angiograms of the head. Then, the catheter was placed into the right common carotid artery. Frontal and lateral angiograms of the neck were obtained. Using biplane roadmap guidance, the catheter was placed into the right internal carotid artery. Frontal, lateral, magnified right anterior oblique and magnified lateral angiograms of the head were obtained. The catheter was subsequently withdrawn. An inflatable band was placed and inflated over the right wrist access site. The vascular sheath was withdrawn and the band was slowly deflated until brisk flow was noted through the arteriotomy site. At this point, the band was reinflated with additional 3 cc of air to obtain patent hemostasis. FINDINGS: Right radial artery ultrasound and right radial artery angiogram: The caliber of the right radial artery is appropriate for angiogram access. The right radial artery and the right ulnar artery have normal course and caliber. No significant anatomical variants noted. Right vertebral artery angiograms: Intracranial bilateral vertebral arteries are unremarkable. Luminal irregularity is seen along the bilateral PICAs. 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 Mild luminal irregularity along the right PCA without hemodynamically significant stenosis. Hypoplastic left P1/PCA in the setting of fetal PCA. No aneurysms or abnormally high-flow, early draining veins are seen. No regions of abnormal hypervascularity are noted. The visualized dural sinuses are patent. Left CCA angiograms-cervical views: Atherosclerotic changes of the left carotid bifurcation without hemodynamically significant stenosis. Left CCA angiograms-cranial views: There is brisk vascular contrast filling of the left ACA and MCA vascular trees. There is also brisk  opacification of the left PCA vascular tree (fetal PCA). Luminal irregularity consistent with intracranial atherosclerotic disease is seen along the supraclinoid left ICA, left ACA, MCA and PCA. There is mild stenosis at the communicating segment of the left ICA and moderate stenosis of the proximal left M1/MCA segment. Multifocal areas of mild stenosis are seen along the left PCA. No aneurysms or abnormally high-flow, early draining veins are seen. No regions of abnormal hypervascularity are noted. The visualized dural sinuses are patent. Right CCA angiograms: Atherosclerotic changes of the right carotid bifurcation without hemodynamically significant stenosis. Right ICA angiograms: Status post right M1/MCA stenting. There is mild narrowing small hyperplasia within the implant without significant in stent stenosis. The M1 segment remains of small caliber, similar to prior angiogram. There is brisk vascular contrast filling of the right ACA and MCA vascular trees. Luminal irregularity of the right MCA and ACA vascular tree with multifocal areas of mild stenosis along the the right ACA. There is also mild stenosis at the supraclinoid segment of the right ICA. No aneurysms or abnormally high-flow, early draining veins are seen. No regions of abnormal hypervascularity are noted. The visualized dural sinuses are patent. PROCEDURE: No intervention performed. IMPRESSION: 1. Status post right M1/MCA stenting and angioplasty. The intracranial stent is widely patent without significant stenosis. 2. Atherosclerotic disease of the carotid bifurcation bilaterally, without hemodynamically significant stenosis. 3. Intracranial atherosclerotic disease, more pronounced at the basilar artery  where there is moderate to severe stenosis. PLAN: Patient may discontinue the Brilinta from the neurointerventional standpoint. Continuing on aspirin q.d. for life is recommended. Electronically Signed   By: Baldemar Lenis M.D.    On: 11/13/2022 15:37   IR ANGIO INTRA EXTRACRAN SEL INTERNAL CAROTID UNI R MOD SED  Result Date: 11/13/2022 INDICATION: [name] is a 72 year old male who presented to the Emergency Department on 04/23/2022 with left-sided weakness; NIHSS 6. He was found to have a proximal right M1/MCA occlusion with underlying atherosclerotic stenosis. He underwent successful mechanical thrombectomy for treatment of the proximal right M1/MCA occlusion with placement of a drug eluting stent in his right MCA treatment of the underlying atherosclerotic high-grade stenosis. He has been taking an aspirin and Brilinta since stent placement. He comes today for diagnostic cerebral angiogram to evaluate stent patency. EXAM: ULTRASOUND-GUIDED VASCULAR ACCESS DIAGNOSTIC CEREBRAL ANGIOGRAM COMPARISON:  Cerebral angiogram intervention April 23, 2022. MEDICATIONS: 5,000 IU heparin, 5 mg Verapamil and 200 mcg nitroglycerin intra arterial 2 right radial artery. ANESTHESIA/SEDATION: Moderate (conscious) sedation was employed during this procedure. A total of Versed 2 mg and Fentanyl 100 mcg was administered intravenously by the radiology nurse. Total intra-service moderate Sedation Time: 40 minutes. The patient's level of consciousness and vital signs were monitored continuously by radiology nursing throughout the procedure under my direct supervision. CONTRAST:  60 mL of Omnipaque 300 milligram/mL FLUOROSCOPY: Radiation Exposure Index (as provided by the fluoroscopic device): 359 mGy Kerma COMPLICATIONS: None immediate. TECHNIQUE: Informed written consent was obtained from the patient after a thorough discussion of the procedural risks, benefits and alternatives. All questions were addressed. Maximal Sterile Barrier Technique was utilized including caps, mask, sterile gowns, sterile gloves, sterile drape, hand hygiene and skin antiseptic. A timeout was performed prior to the initiation of the procedure. Using the modified Seldinger  technique and a micropuncture kit, access was gained to the right radial artery at the wrist and a 5 French sheath was placed. Real-time ultrasound guidance was utilized for vascular access including the acquisition of a permanent ultrasound image documenting patency of the accessed vessel. Slow intra arterial infusion of 5,000 IU heparin, 5 mg Verapamil and 200 mcg nitroglycerin diluted in patient's own blood was performed. No significant fluctuation in patient's blood pressure seen. Then, a right radial artery angiogram was obtained via sheath side port. Normal brachial artery branching pattern seen. No significant anatomical variation. The right radial artery caliber is adequate for vascular access. Next, a 5 [place] Simmons 2 glide catheter was navigated over a 0.035" Terumo Glidewire into the right subclavian artery under fluoroscopic guidance. Using road map guidance, the catheter was placed into the right vertebral artery. Frontal and lateral angiograms of the head were obtained. Next, the catheter was placed into the left common carotid artery. Frontal and lateral angiograms of the neck were obtained followed by frontal and lateral angiograms of the head. Then, the catheter was placed into the right common carotid artery. Frontal and lateral angiograms of the neck were obtained. Using biplane roadmap guidance, the catheter was placed into the right internal carotid artery. Frontal, lateral, magnified right anterior oblique and magnified lateral angiograms of the head were obtained. The catheter was subsequently withdrawn. An inflatable band was placed and inflated over the right wrist access site. The vascular sheath was withdrawn and the band was slowly deflated until brisk flow was noted through the arteriotomy site. At this point, the band was reinflated with additional 3 cc of air to obtain  patent hemostasis. FINDINGS: Right radial artery ultrasound and right radial artery angiogram: The caliber of the  right radial artery is appropriate for angiogram access. The right radial artery and the right ulnar artery have normal course and caliber. No significant anatomical variants noted. Right vertebral artery angiograms: Intracranial bilateral vertebral arteries are unremarkable. Luminal irregularity is seen along the bilateral PICAs. 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 Mild luminal irregularity along the right PCA without hemodynamically significant stenosis. Hypoplastic left P1/PCA in the setting of fetal PCA. No aneurysms or abnormally high-flow, early draining veins are seen. No regions of abnormal hypervascularity are noted. The visualized dural sinuses are patent. Left CCA angiograms-cervical views: Atherosclerotic changes of the left carotid bifurcation without hemodynamically significant stenosis. Left CCA angiograms-cranial views: There is brisk vascular contrast filling of the left ACA and MCA vascular trees. There is also brisk opacification of the left PCA vascular tree (fetal PCA). Luminal irregularity consistent with intracranial atherosclerotic disease is seen along the supraclinoid left ICA, left ACA, MCA and PCA. There is mild stenosis at the communicating segment of the left ICA and moderate stenosis of the proximal left M1/MCA segment. Multifocal areas of mild stenosis are seen along the left PCA. No aneurysms or abnormally high-flow, early draining veins are seen. No regions of abnormal hypervascularity are noted. The visualized dural sinuses are patent. Right CCA angiograms: Atherosclerotic changes of the right carotid bifurcation without hemodynamically significant stenosis. Right ICA angiograms: Status post right M1/MCA stenting. There is mild narrowing small hyperplasia within the implant without significant in stent stenosis. The M1 segment remains of small caliber, similar to prior angiogram.  There is brisk vascular contrast filling of the right ACA and MCA vascular trees. Luminal irregularity of the right MCA and ACA vascular tree with multifocal areas of mild stenosis along the the right ACA. There is also mild stenosis at the supraclinoid segment of the right ICA. No aneurysms or abnormally high-flow, early draining veins are seen. No regions of abnormal hypervascularity are noted. The visualized dural sinuses are patent. PROCEDURE: No intervention performed. IMPRESSION: 1. Status post right M1/MCA stenting and angioplasty. The intracranial stent is widely patent without significant stenosis. 2. Atherosclerotic disease of the carotid bifurcation bilaterally, without hemodynamically significant stenosis. 3. Intracranial atherosclerotic disease, more pronounced at the basilar artery where there is moderate to severe stenosis. PLAN: Patient may discontinue the Brilinta from the neurointerventional standpoint. Continuing on aspirin q.d. for life is recommended. Electronically Signed   By: Baldemar Lenis M.D.   On: 11/13/2022 15:37   IR ANGIO VERTEBRAL SEL VERTEBRAL UNI R MOD SED  Result Date: 11/13/2022 INDICATION: [name] is a 72 year old male who presented to the Emergency Department on 04/23/2022 with left-sided weakness; NIHSS 6. He was found to have a proximal right M1/MCA occlusion with underlying atherosclerotic stenosis. He underwent successful mechanical thrombectomy for treatment of the proximal right M1/MCA occlusion with placement of a drug eluting stent in his right MCA treatment of the underlying atherosclerotic high-grade stenosis. He has been taking an aspirin and Brilinta since stent placement. He comes today for diagnostic cerebral angiogram to evaluate stent patency. EXAM: ULTRASOUND-GUIDED VASCULAR ACCESS DIAGNOSTIC CEREBRAL ANGIOGRAM COMPARISON:  Cerebral angiogram intervention April 23, 2022. MEDICATIONS: 5,000 IU heparin, 5 mg Verapamil and 200 mcg  nitroglycerin intra arterial 2 right radial artery. ANESTHESIA/SEDATION: Moderate (conscious) sedation was employed during this procedure. A total of Versed 2 mg  and Fentanyl 100 mcg was administered intravenously by the radiology nurse. Total intra-service moderate Sedation Time: 40 minutes. The patient's level of consciousness and vital signs were monitored continuously by radiology nursing throughout the procedure under my direct supervision. CONTRAST:  60 mL of Omnipaque 300 milligram/mL FLUOROSCOPY: Radiation Exposure Index (as provided by the fluoroscopic device): 359 mGy Kerma COMPLICATIONS: None immediate. TECHNIQUE: Informed written consent was obtained from the patient after a thorough discussion of the procedural risks, benefits and alternatives. All questions were addressed. Maximal Sterile Barrier Technique was utilized including caps, mask, sterile gowns, sterile gloves, sterile drape, hand hygiene and skin antiseptic. A timeout was performed prior to the initiation of the procedure. Using the modified Seldinger technique and a micropuncture kit, access was gained to the right radial artery at the wrist and a 5 French sheath was placed. Real-time ultrasound guidance was utilized for vascular access including the acquisition of a permanent ultrasound image documenting patency of the accessed vessel. Slow intra arterial infusion of 5,000 IU heparin, 5 mg Verapamil and 200 mcg nitroglycerin diluted in patient's own blood was performed. No significant fluctuation in patient's blood pressure seen. Then, a right radial artery angiogram was obtained via sheath side port. Normal brachial artery branching pattern seen. No significant anatomical variation. The right radial artery caliber is adequate for vascular access. Next, a 5 [place] Simmons 2 glide catheter was navigated over a 0.035" Terumo Glidewire into the right subclavian artery under fluoroscopic guidance. Using road map guidance, the catheter was  placed into the right vertebral artery. Frontal and lateral angiograms of the head were obtained. Next, the catheter was placed into the left common carotid artery. Frontal and lateral angiograms of the neck were obtained followed by frontal and lateral angiograms of the head. Then, the catheter was placed into the right common carotid artery. Frontal and lateral angiograms of the neck were obtained. Using biplane roadmap guidance, the catheter was placed into the right internal carotid artery. Frontal, lateral, magnified right anterior oblique and magnified lateral angiograms of the head were obtained. The catheter was subsequently withdrawn. An inflatable band was placed and inflated over the right wrist access site. The vascular sheath was withdrawn and the band was slowly deflated until brisk flow was noted through the arteriotomy site. At this point, the band was reinflated with additional 3 cc of air to obtain patent hemostasis. FINDINGS: Right radial artery ultrasound and right radial artery angiogram: The caliber of the right radial artery is appropriate for angiogram access. The right radial artery and the right ulnar artery have normal course and caliber. No significant anatomical variants noted. Right vertebral artery angiograms: Intracranial bilateral vertebral arteries are unremarkable. Luminal irregularity is seen along the bilateral PICAs. 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 Mild luminal irregularity along the right PCA without hemodynamically significant stenosis. Hypoplastic left P1/PCA in the setting of fetal PCA. No aneurysms or abnormally high-flow, early draining veins are seen. No regions of abnormal hypervascularity are noted. The visualized dural sinuses are patent. Left CCA angiograms-cervical views: Atherosclerotic changes of the left carotid bifurcation without hemodynamically significant  stenosis. Left CCA angiograms-cranial views: There is brisk vascular contrast filling of the left ACA and MCA vascular trees. There is also brisk opacification of the left PCA vascular tree (fetal PCA). Luminal irregularity consistent with intracranial atherosclerotic disease is seen along the supraclinoid left ICA, left ACA, MCA and PCA. There is mild stenosis  at the communicating segment of the left ICA and moderate stenosis of the proximal left M1/MCA segment. Multifocal areas of mild stenosis are seen along the left PCA. No aneurysms or abnormally high-flow, early draining veins are seen. No regions of abnormal hypervascularity are noted. The visualized dural sinuses are patent. Right CCA angiograms: Atherosclerotic changes of the right carotid bifurcation without hemodynamically significant stenosis. Right ICA angiograms: Status post right M1/MCA stenting. There is mild narrowing small hyperplasia within the implant without significant in stent stenosis. The M1 segment remains of small caliber, similar to prior angiogram. There is brisk vascular contrast filling of the right ACA and MCA vascular trees. Luminal irregularity of the right MCA and ACA vascular tree with multifocal areas of mild stenosis along the the right ACA. There is also mild stenosis at the supraclinoid segment of the right ICA. No aneurysms or abnormally high-flow, early draining veins are seen. No regions of abnormal hypervascularity are noted. The visualized dural sinuses are patent. PROCEDURE: No intervention performed. IMPRESSION: 1. Status post right M1/MCA stenting and angioplasty. The intracranial stent is widely patent without significant stenosis. 2. Atherosclerotic disease of the carotid bifurcation bilaterally, without hemodynamically significant stenosis. 3. Intracranial atherosclerotic disease, more pronounced at the basilar artery where there is moderate to severe stenosis. PLAN: Patient may discontinue the Brilinta from the  neurointerventional standpoint. Continuing on aspirin q.d. for life is recommended. Electronically Signed   By: Baldemar Lenis M.D.   On: 11/13/2022 15:37   IR ANGIO INTRA EXTRACRAN SEL COM CAROTID INNOMINATE UNI L MOD SED  Result Date: 11/13/2022 INDICATION: [name] is a 72 year old male who presented to the Emergency Department on 04/23/2022 with left-sided weakness; NIHSS 6. He was found to have a proximal right M1/MCA occlusion with underlying atherosclerotic stenosis. He underwent successful mechanical thrombectomy for treatment of the proximal right M1/MCA occlusion with placement of a drug eluting stent in his right MCA treatment of the underlying atherosclerotic high-grade stenosis. He has been taking an aspirin and Brilinta since stent placement. He comes today for diagnostic cerebral angiogram to evaluate stent patency. EXAM: ULTRASOUND-GUIDED VASCULAR ACCESS DIAGNOSTIC CEREBRAL ANGIOGRAM COMPARISON:  Cerebral angiogram intervention April 23, 2022. MEDICATIONS: 5,000 IU heparin, 5 mg Verapamil and 200 mcg nitroglycerin intra arterial 2 right radial artery. ANESTHESIA/SEDATION: Moderate (conscious) sedation was employed during this procedure. A total of Versed 2 mg and Fentanyl 100 mcg was administered intravenously by the radiology nurse. Total intra-service moderate Sedation Time: 40 minutes. The patient's level of consciousness and vital signs were monitored continuously by radiology nursing throughout the procedure under my direct supervision. CONTRAST:  60 mL of Omnipaque 300 milligram/mL FLUOROSCOPY: Radiation Exposure Index (as provided by the fluoroscopic device): 359 mGy Kerma COMPLICATIONS: None immediate. TECHNIQUE: Informed written consent was obtained from the patient after a thorough discussion of the procedural risks, benefits and alternatives. All questions were addressed. Maximal Sterile Barrier Technique was utilized including caps, mask, sterile gowns,  sterile gloves, sterile drape, hand hygiene and skin antiseptic. A timeout was performed prior to the initiation of the procedure. Using the modified Seldinger technique and a micropuncture kit, access was gained to the right radial artery at the wrist and a 5 French sheath was placed. Real-time ultrasound guidance was utilized for vascular access including the acquisition of a permanent ultrasound image documenting patency of the accessed vessel. Slow intra arterial infusion of 5,000 IU heparin, 5 mg Verapamil and 200 mcg nitroglycerin diluted in patient's own blood was performed. No significant  fluctuation in patient's blood pressure seen. Then, a right radial artery angiogram was obtained via sheath side port. Normal brachial artery branching pattern seen. No significant anatomical variation. The right radial artery caliber is adequate for vascular access. Next, a 5 [place] Simmons 2 glide catheter was navigated over a 0.035" Terumo Glidewire into the right subclavian artery under fluoroscopic guidance. Using road map guidance, the catheter was placed into the right vertebral artery. Frontal and lateral angiograms of the head were obtained. Next, the catheter was placed into the left common carotid artery. Frontal and lateral angiograms of the neck were obtained followed by frontal and lateral angiograms of the head. Then, the catheter was placed into the right common carotid artery. Frontal and lateral angiograms of the neck were obtained. Using biplane roadmap guidance, the catheter was placed into the right internal carotid artery. Frontal, lateral, magnified right anterior oblique and magnified lateral angiograms of the head were obtained. The catheter was subsequently withdrawn. An inflatable band was placed and inflated over the right wrist access site. The vascular sheath was withdrawn and the band was slowly deflated until brisk flow was noted through the arteriotomy site. At this point, the band was  reinflated with additional 3 cc of air to obtain patent hemostasis. FINDINGS: Right radial artery ultrasound and right radial artery angiogram: The caliber of the right radial artery is appropriate for angiogram access. The right radial artery and the right ulnar artery have normal course and caliber. No significant anatomical variants noted. Right vertebral artery angiograms: Intracranial bilateral vertebral arteries are unremarkable. Luminal irregularity is seen along the bilateral PICAs. 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 Mild luminal irregularity along the right PCA without hemodynamically significant stenosis. Hypoplastic left P1/PCA in the setting of fetal PCA. No aneurysms or abnormally high-flow, early draining veins are seen. No regions of abnormal hypervascularity are noted. The visualized dural sinuses are patent. Left CCA angiograms-cervical views: Atherosclerotic changes of the left carotid bifurcation without hemodynamically significant stenosis. Left CCA angiograms-cranial views: There is brisk vascular contrast filling of the left ACA and MCA vascular trees. There is also brisk opacification of the left PCA vascular tree (fetal PCA). Luminal irregularity consistent with intracranial atherosclerotic disease is seen along the supraclinoid left ICA, left ACA, MCA and PCA. There is mild stenosis at the communicating segment of the left ICA and moderate stenosis of the proximal left M1/MCA segment. Multifocal areas of mild stenosis are seen along the left PCA. No aneurysms or abnormally high-flow, early draining veins are seen. No regions of abnormal hypervascularity are noted. The visualized dural sinuses are patent. Right CCA angiograms: Atherosclerotic changes of the right carotid bifurcation without hemodynamically significant stenosis. Right ICA angiograms: Status post right M1/MCA stenting. There is  mild narrowing small hyperplasia within the implant without significant in stent stenosis. The M1 segment remains of small caliber, similar to prior angiogram. There is brisk vascular contrast filling of the right ACA and MCA vascular trees. Luminal irregularity of the right MCA and ACA vascular tree with multifocal areas of mild stenosis along the the right ACA. There is also mild stenosis at the supraclinoid segment of the right ICA. No aneurysms or abnormally high-flow, early draining veins are seen. No regions of abnormal hypervascularity are noted. The visualized dural sinuses are patent. PROCEDURE: No intervention performed. IMPRESSION: 1. Status post right M1/MCA stenting and angioplasty. The intracranial stent is widely patent without significant stenosis.  2. Atherosclerotic disease of the carotid bifurcation bilaterally, without hemodynamically significant stenosis. 3. Intracranial atherosclerotic disease, more pronounced at the basilar artery where there is moderate to severe stenosis. PLAN: Patient may discontinue the Brilinta from the neurointerventional standpoint. Continuing on aspirin q.d. for life is recommended. Electronically Signed   By: Baldemar Lenis M.D.   On: 11/13/2022 15:37    EKG: SR no acute ST changes    ASSESSMENT AND PLAN:   Chest Pain:  ? SEMI no acute ECG changes start heparin Home meds include Torpl 50 mg Does have PPM back up. TTE to assess EF Continue ASA and Brilinta Trend troponins  No history of CAD but smoker with HTN and recent CVA indicating vascular dx.   Shared Decision Making/Informed Consent The risks [stroke (1 in 1000), death (1 in 1000), kidney failure [usually temporary] (1 in 500), bleeding (1 in 200), allergic reaction [possibly serious] (1 in 200)], benefits (diagnostic support and management of coronary artery disease) and alternatives of a cardiac catheterization were discussed in detail with Ms. Wanita Chamberlain and she is willing to proceed.   2.   CVA:  recent with stenting no bleed Complete resolution of focal deficits Avoid use of Effient continue Brilinta/ASA  3.  HTN:  continue norvasc   4. Smoking:  CXR NAD emphysema   5.  PPM:  normal function f/u Mealor   Signed: Charlton Haws 11/15/2022, 10:05 AM

## 2022-11-15 NOTE — H&P (View-Only) (Signed)
CARDIOLOGY CONSULT NOTE       Patient ID: [name] MRN: [id number] DOB/AGE: 06-05-1950 72 y.o.  Admit date: 11/15/2022 Referring Physician: Renaye Rakers Primary Physician: Gust Rung, DO Primary Cardiologist: Mealor Reason for Consultation: Chest pain  Active Problems:   * No active hospital problems. *   HPI:  72 y.o. seen in ED at request of Dr Renaye Rakers for chest pain. Patient has no history of CAD. History of HTN, SSS with PPM and recent CVA  11/13/22 had slurred speech and left sided weakness with proximal right M1/MCA occlusion with successful mechanical thrombectomy with stenting of right MCA He also has remote lacunar infarcts CT with no bleed and was Rx with ASA/Brilinta. [place] Noted more dyspnea last 3 months ( this anteceded Brilinta use ) Last night had some grits / eggs and started having SSCP/pressure. Awoke him from sleep at 5:00 am Burning pressure in chest associated with nausea and dry heaves Currently resolved. In ER ECG no acute changes Initial troponin 626.    ROS All other systems reviewed and negative except as noted above  Past Medical History:  Diagnosis Date   Atrial tachycardia 04/11/2012   Bilateral flank pain 06/26/2020   CEREBROVASCULAR ACCIDENT 10/17/2008   Acute CVA of right thalamus 10/03/08 ECHO performed 2/2 CVA 10/09/08, EF 55-65%  Aspirin 325 mg daily and try to work on quitting smoking.    Emphysema    per multiple CXRs   Hypertension    goal < 140/90   Sickle cell trait (HCC)    Snoring    Sleep study 09/02/10 was WNL   Stroke [facility]) 10/03/2008   Right thalamus   TIA (transient ischemic attack) 12/25/2021   Tuberculosis at age [age]    Family History  Problem Relation Age of Onset   Hypertension Mother    Cancer Sister        Unknown   Hypertension Sister    Diabetes Sister    Cancer Sister    Sickle cell trait Son    Sickle cell anemia Son     Social History   Socioeconomic History    Marital status: Married    Spouse name: Not on file   Number of children: 6   Years of education: 15   Highest education level: Not on file  Occupational History   Occupation: Disabled    Comment: PT Administrator at [employer]  Tobacco Use   Smoking status: Every Day    Current packs/day: 0.10    Average packs/day: 0.1 packs/day for 10.0 years (1.0 ttl pk-yrs)    Types: Cigarettes   Smokeless tobacco: Never   Tobacco comments:    2 cigs daily  Vaping Use   Vaping status: Former  Substance and Sexual Activity   Alcohol use: No    Alcohol/week: 0.0 standard drinks of alcohol   Drug use: No    Comment: previous polysubstance abuser, quit x 13 years   Sexual activity: Yes    Birth control/protection: Condom  Other Topics Concern   Not on file  Social History Narrative   Current Social History 06/29/2019        Patient lives with spouse in a ground floor apartment which is 1 story. There are not steps up to the entrance the patient uses.       Patient's method of transportation is personal car.      The highest level of education was Bachelor's Degree; now working on [employer].  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      Identified important Relationships are "My wife"      Pets : None       Interests / Fun: Computers, "I'm an Chief Technology Officer."       Current Stressors: "I hardly let anything bother me. I walk away"       Religious / Personal Beliefs: Pent[employer]al Holiness       L. Ducatte, BSN, RN-BC       Social Determinants of Health   Financial Resource Strain: Medium Risk (01/15/2022)   Overall Financial Resource Strain (CARDIA)    Difficulty of Paying Living Expenses: Somewhat hard  Food Insecurity: No Food Insecurity (04/07/2022)   Hunger Vital Sign    Worried About Running Out of Food in the Last Year: Never true    Ran Out of Food in the Last Year: Never true  Recent Concern: Food  Insecurity - Food Insecurity Present (01/15/2022)   Hunger Vital Sign    Worried About Running Out of Food in the Last Year: Often true    Ran Out of Food in the Last Year: Sometimes true  Transportation Needs: No Transportation Needs (04/07/2022)   PRAPARE - Administrator, Civil Service (Medical): No    Lack of Transportation (Non-Medical): No  Physical Activity: Insufficiently Active (01/15/2022)   Exercise Vital Sign    Days of Exercise per Week: 3 days    Minutes of Exercise per Session: 20 min  Stress: Stress Concern Present (01/15/2022)   [employer] of Occupational Health - Occupational Stress Questionnaire    Feeling of Stress : To some extent  Social Connections: Socially Integrated (01/15/2022)   Social Connection and Isolation Panel [NHANES]    Frequency of Communication with Friends and Family: More than three times a week    Frequency of Social Gatherings with Friends and Family: Once a week    Attends Religious Services: More than 4 times per year    Active Member of [employer] or Organizations: Yes    Attends Banker Meetings: Never    Marital Status: Married  Catering manager Violence: Not At Risk (04/07/2022)   Humiliation, Afraid, Rape, and Kick questionnaire    Fear of Current or Ex-Partner: No    Emotionally Abused: No    Physically Abused: No    Sexually Abused: No    Past Surgical History:  Procedure Laterality Date   APPENDECTOMY  1977   IR ANGIO INTRA EXTRACRAN SEL COM CAROTID INNOMINATE UNI L MOD SED  11/12/2022   IR ANGIO INTRA EXTRACRAN SEL INTERNAL CAROTID UNI R MOD SED  11/12/2022   IR ANGIO VERTEBRAL SEL VERTEBRAL UNI R MOD SED  11/12/2022   IR CT HEAD LTD  04/23/2022   IR CT HEAD LTD  04/23/2022   IR PERCUTANEOUS ART THROMBECTOMY/INFUSION INTRACRANIAL INC DIAG ANGIO  04/23/2022   IR PTA INTRACRANIAL  04/23/2022   IR US GUIDE VASC ACCESS RIGHT  04/23/2022   IR US GUIDE VASC ACCESS RIGHT  04/23/2022   IR US GUIDE VASC ACCESS RIGHT   11/12/2022   PACEMAKER INSERTION  07/24/08   MDT Adapta L implanted by Dr Fredrich Birks   RADIOLOGY WITH ANESTHESIA N/A 04/23/2022   Procedure: IR WITH ANESTHESIA;  Surgeon: Radiologist, Medication, MD;  Location: [facility] OR;  Service: Radiology;  Laterality: N/A;   VASECTOMY  1990      Current Facility-Administered  Medications:    aspirin tablet 325 mg, 325 mg, Oral, Daily, Trifan, Kermit Balo, MD, 325 mg at 11/15/22 1004   nitroGLYCERIN (NITROSTAT) SL tablet 0.4 mg, 0.4 mg, Sublingual, Q5 min PRN, Renaye Rakers, Kermit Balo, MD, 0.4 mg at 11/15/22 1005  Current Outpatient Medications:    albuterol (VENTOLIN HFA) 108 (90 Base) MCG/ACT inhaler, INHALE 2 PUFFS BY MOUTH EVERY 6 HOURS AS NEEDED FOR WHEEZING OR SHORTNESS OF BREATH, Disp: 18 g, Rfl: 0   amLODipine (NORVASC) 5 MG tablet, Take 1 tablet (5 mg total) by mouth daily., Disp: 90 tablet, Rfl: 3   aspirin EC 81 MG tablet, Take 1 tablet (81 mg total) by mouth daily. Swallow whole., Disp: 30 tablet, Rfl: 12   atorvastatin (LIPITOR) 80 MG tablet, Take 1 tablet (80 mg total) by mouth daily., Disp: 90 tablet, Rfl: 3   cetirizine (ZYRTEC ALLERGY) 10 MG tablet, Take 1 tablet (10 mg total) by mouth daily., Disp: 30 tablet, Rfl: 2   ezetimibe (ZETIA) 10 MG tablet, Take 1 tablet by mouth once daily, Disp: 90 tablet, Rfl: 3   fluticasone (FLONASE) 50 MCG/ACT nasal spray, Place 1 spray into both nostrils daily., Disp: 9.9 mL, Rfl: 2   metoprolol succinate (TOPROL-XL) 50 MG 24 hr tablet, Take 1 tablet (50 mg total) by mouth daily. Take with or immediately following a meal., Disp: 90 tablet, Rfl: 3   nicotine (NICODERM CQ - DOSED IN MG/24 HOURS) 21 mg/24hr patch, Place 1 patch (21 mg total) onto the skin daily. (Patient not taking: Reported on 11/02/2022), Disp: 28 patch, Rfl: 0   nicotine polacrilex (NICORETTE) 4 MG gum, Take 1 each (4 mg total) by mouth as needed for smoking cessation. (Patient not taking: Reported on 11/02/2022), Disp: 100 tablet, Rfl: 1   senna-docusate  (SENOKOT-S) 8.6-50 MG tablet, Take 1 tablet by mouth at bedtime., Disp: 30 tablet, Rfl: 1   spironolactone (ALDACTONE) 25 MG tablet, Take 1 tablet (25 mg total) by mouth daily., Disp: 90 tablet, Rfl: 3   ticagrelor (BRILINTA) 90 MG TABS tablet, Take 1 tablet (90 mg total) by mouth 2 (two) times daily., Disp: 60 tablet, Rfl: 0   umeclidinium bromide (INCRUSE ELLIPTA) 62.5 MCG/ACT AEPB, Inhale 1 puff into the lungs daily., Disp: 90 each, Rfl: 2  aspirin  325 mg Oral Daily     Physical Exam: Blood pressure (!) 197/122, pulse 60, temperature 97.6 F (36.4 C), temperature source Oral, resp. rate 13, SpO2 100%.   Affect appropriate Black male no distress  HEENT: normal Neck supple with no adenopathy JVP normal no bruits no thyromegaly Lungs clear with no wheezing and good diaphragmatic motion Heart:  S1/S2 no murmur, no rub, gallop or click PMI normal  PPM under left clavicle Abdomen: benighn, BS positve, no tenderness, no AAA no bruit.  No HSM or HJR Distal pulses intact with no bruits No edema Neuro non-focal Skin warm and dry No muscular weakness   Labs:   Lab Results  Component Value Date   WBC 5.4 11/15/2022   HGB 13.3 11/15/2022   HCT 39.3 11/15/2022   MCV 85.1 11/15/2022   PLT 216 11/15/2022    Recent Labs  Lab 11/15/22 0812  NA 141  K 3.1*  CL 102  CO2 26  BUN 8  CREATININE 1.05  CALCIUM 9.5  GLUCOSE 125*   Lab Results  Component Value Date   CKTOTAL 81 09/14/2011   CKMB 1.4 09/14/2011   TROPONINI <0.30 09/14/2011    Lab Results  Component Value Date   CHOL 152 10/22/2022   CHOL 126 04/24/2022   CHOL 193 04/07/2022   Lab Results  Component Value Date   HDL 55 10/22/2022   HDL 33 (L) 04/24/2022   HDL 51 04/07/2022   Lab Results  Component Value Date   LDLCALC 82 10/22/2022   LDLCALC 82 04/24/2022   LDLCALC 128 (H) 04/07/2022   Lab Results  Component Value Date   TRIG 77 10/22/2022   TRIG 56 04/24/2022   TRIG 69 04/07/2022   Lab Results   Component Value Date   CHOLHDL 2.8 10/22/2022   CHOLHDL 3.8 04/24/2022   CHOLHDL 3.8 04/07/2022   No results found for: "LDLDIRECT"    Radiology: DG Chest 2 View  Result Date: 11/15/2022 CLINICAL DATA:  72 year old male with history of chest pain. EXAM: CHEST - 2 VIEW COMPARISON:  Chest x-ray 08/01/2020. FINDINGS: Lung volumes are increased with advanced emphysematous changes. No consolidative airspace disease. No pleural effusions. No pneumothorax. No pulmonary nodule or mass noted. Pulmonary vasculature and the cardiomediastinal silhouette are within normal limits. Atherosclerosis in the thoracic aorta. Left-sided pacemaker device in place with lead tips projecting over the expected location of the right atrium and right ventricle. IMPRESSION: 1. No radiographic evidence of acute cardiopulmonary disease. 2. Emphysema. 3. Aortic atherosclerosis. Electronically Signed   By: Trudie Reed M.D.   On: 11/15/2022 09:07   IR US Guide Vasc Access Right  Result Date: 11/13/2022 INDICATION: [name] is a 72 year old male who presented to the Emergency Department on 04/23/2022 with left-sided weakness; NIHSS 6. He was found to have a proximal right M1/MCA occlusion with underlying atherosclerotic stenosis. He underwent successful mechanical thrombectomy for treatment of the proximal right M1/MCA occlusion with placement of a drug eluting stent in his right MCA treatment of the underlying atherosclerotic high-grade stenosis. He has been taking an aspirin and Brilinta since stent placement. He comes today for diagnostic cerebral angiogram to evaluate stent patency. EXAM: ULTRASOUND-GUIDED VASCULAR ACCESS DIAGNOSTIC CEREBRAL ANGIOGRAM COMPARISON:  Cerebral angiogram intervention April 23, 2022. MEDICATIONS: 5,000 IU heparin, 5 mg Verapamil and 200 mcg nitroglycerin intra arterial 2 right radial artery. ANESTHESIA/SEDATION: Moderate (conscious) sedation was employed during this procedure. A total of  Versed 2 mg and Fentanyl 100 mcg was administered intravenously by the radiology nurse. Total intra-service moderate Sedation Time: 40 minutes. The patient's level of consciousness and vital signs were monitored continuously by radiology nursing throughout the procedure under my direct supervision. CONTRAST:  60 mL of Omnipaque 300 milligram/mL FLUOROSCOPY: Radiation Exposure Index (as provided by the fluoroscopic device): 359 mGy Kerma COMPLICATIONS: None immediate. TECHNIQUE: Informed written consent was obtained from the patient after a thorough discussion of the procedural risks, benefits and alternatives. All questions were addressed. Maximal Sterile Barrier Technique was utilized including caps, mask, sterile gowns, sterile gloves, sterile drape, hand hygiene and skin antiseptic. A timeout was performed prior to the initiation of the procedure. Using the modified Seldinger technique and a micropuncture kit, access was gained to the right radial artery at the wrist and a 5 French sheath was placed. Real-time ultrasound guidance was utilized for vascular access including the acquisition of a permanent ultrasound image documenting patency of the accessed vessel. Slow intra arterial infusion of 5,000 IU heparin, 5 mg Verapamil and 200 mcg nitroglycerin diluted in patient's own blood was performed. No significant fluctuation in patient's blood pressure seen. Then, a right radial artery angiogram was obtained via sheath side port. Normal brachial artery branching  pattern seen. No significant anatomical variation. The right radial artery caliber is adequate for vascular access. Next, a 5 [place] Simmons 2 glide catheter was navigated over a 0.035" Terumo Glidewire into the right subclavian artery under fluoroscopic guidance. Using road map guidance, the catheter was placed into the right vertebral artery. Frontal and lateral angiograms of the head were obtained. Next, the catheter was placed into the left common  carotid artery. Frontal and lateral angiograms of the neck were obtained followed by frontal and lateral angiograms of the head. Then, the catheter was placed into the right common carotid artery. Frontal and lateral angiograms of the neck were obtained. Using biplane roadmap guidance, the catheter was placed into the right internal carotid artery. Frontal, lateral, magnified right anterior oblique and magnified lateral angiograms of the head were obtained. The catheter was subsequently withdrawn. An inflatable band was placed and inflated over the right wrist access site. The vascular sheath was withdrawn and the band was slowly deflated until brisk flow was noted through the arteriotomy site. At this point, the band was reinflated with additional 3 cc of air to obtain patent hemostasis. FINDINGS: Right radial artery ultrasound and right radial artery angiogram: The caliber of the right radial artery is appropriate for angiogram access. The right radial artery and the right ulnar artery have normal course and caliber. No significant anatomical variants noted. Right vertebral artery angiograms: Intracranial bilateral vertebral arteries are unremarkable. Luminal irregularity is seen along the bilateral PICAs. 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 Mild luminal irregularity along the right PCA without hemodynamically significant stenosis. Hypoplastic left P1/PCA in the setting of fetal PCA. No aneurysms or abnormally high-flow, early draining veins are seen. No regions of abnormal hypervascularity are noted. The visualized dural sinuses are patent. Left CCA angiograms-cervical views: Atherosclerotic changes of the left carotid bifurcation without hemodynamically significant stenosis. Left CCA angiograms-cranial views: There is brisk vascular contrast filling of the left ACA and MCA vascular trees. There is also brisk  opacification of the left PCA vascular tree (fetal PCA). Luminal irregularity consistent with intracranial atherosclerotic disease is seen along the supraclinoid left ICA, left ACA, MCA and PCA. There is mild stenosis at the communicating segment of the left ICA and moderate stenosis of the proximal left M1/MCA segment. Multifocal areas of mild stenosis are seen along the left PCA. No aneurysms or abnormally high-flow, early draining veins are seen. No regions of abnormal hypervascularity are noted. The visualized dural sinuses are patent. Right CCA angiograms: Atherosclerotic changes of the right carotid bifurcation without hemodynamically significant stenosis. Right ICA angiograms: Status post right M1/MCA stenting. There is mild narrowing small hyperplasia within the implant without significant in stent stenosis. The M1 segment remains of small caliber, similar to prior angiogram. There is brisk vascular contrast filling of the right ACA and MCA vascular trees. Luminal irregularity of the right MCA and ACA vascular tree with multifocal areas of mild stenosis along the the right ACA. There is also mild stenosis at the supraclinoid segment of the right ICA. No aneurysms or abnormally high-flow, early draining veins are seen. No regions of abnormal hypervascularity are noted. The visualized dural sinuses are patent. PROCEDURE: No intervention performed. IMPRESSION: 1. Status post right M1/MCA stenting and angioplasty. The intracranial stent is widely patent without significant stenosis. 2. Atherosclerotic disease of the carotid bifurcation bilaterally, without hemodynamically significant stenosis. 3. Intracranial atherosclerotic disease, more pronounced at the basilar artery  where there is moderate to severe stenosis. PLAN: Patient may discontinue the Brilinta from the neurointerventional standpoint. Continuing on aspirin q.d. for life is recommended. Electronically Signed   By: Baldemar Lenis M.D.    On: 11/13/2022 15:37   IR ANGIO INTRA EXTRACRAN SEL INTERNAL CAROTID UNI R MOD SED  Result Date: 11/13/2022 INDICATION: ZADIAN PERKEY is a 72 year old male who presented to the Emergency Department on 04/23/2022 with left-sided weakness; NIHSS 6. He was found to have a proximal right M1/MCA occlusion with underlying atherosclerotic stenosis. He underwent successful mechanical thrombectomy for treatment of the proximal right M1/MCA occlusion with placement of a drug eluting stent in his right MCA treatment of the underlying atherosclerotic high-grade stenosis. He has been taking an aspirin and Brilinta since stent placement. He comes today for diagnostic cerebral angiogram to evaluate stent patency. EXAM: ULTRASOUND-GUIDED VASCULAR ACCESS DIAGNOSTIC CEREBRAL ANGIOGRAM COMPARISON:  Cerebral angiogram intervention April 23, 2022. MEDICATIONS: 5,000 IU heparin, 5 mg Verapamil and 200 mcg nitroglycerin intra arterial 2 right radial artery. ANESTHESIA/SEDATION: Moderate (conscious) sedation was employed during this procedure. A total of Versed 2 mg and Fentanyl 100 mcg was administered intravenously by the radiology nurse. Total intra-service moderate Sedation Time: 40 minutes. The patient's level of consciousness and vital signs were monitored continuously by radiology nursing throughout the procedure under my direct supervision. CONTRAST:  60 mL of Omnipaque 300 milligram/mL FLUOROSCOPY: Radiation Exposure Index (as provided by the fluoroscopic device): 359 mGy Kerma COMPLICATIONS: None immediate. TECHNIQUE: Informed written consent was obtained from the patient after a thorough discussion of the procedural risks, benefits and alternatives. All questions were addressed. Maximal Sterile Barrier Technique was utilized including caps, mask, sterile gowns, sterile gloves, sterile drape, hand hygiene and skin antiseptic. A timeout was performed prior to the initiation of the procedure. Using the modified Seldinger  technique and a micropuncture kit, access was gained to the right radial artery at the wrist and a 5 French sheath was placed. Real-time ultrasound guidance was utilized for vascular access including the acquisition of a permanent ultrasound image documenting patency of the accessed vessel. Slow intra arterial infusion of 5,000 IU heparin, 5 mg Verapamil and 200 mcg nitroglycerin diluted in patient's own blood was performed. No significant fluctuation in patient's blood pressure seen. Then, a right radial artery angiogram was obtained via sheath side port. Normal brachial artery branching pattern seen. No significant anatomical variation. The right radial artery caliber is adequate for vascular access. Next, a 5 [place] Simmons 2 glide catheter was navigated over a 0.035" Terumo Glidewire into the right subclavian artery under fluoroscopic guidance. Using road map guidance, the catheter was placed into the right vertebral artery. Frontal and lateral angiograms of the head were obtained. Next, the catheter was placed into the left common carotid artery. Frontal and lateral angiograms of the neck were obtained followed by frontal and lateral angiograms of the head. Then, the catheter was placed into the right common carotid artery. Frontal and lateral angiograms of the neck were obtained. Using biplane roadmap guidance, the catheter was placed into the right internal carotid artery. Frontal, lateral, magnified right anterior oblique and magnified lateral angiograms of the head were obtained. The catheter was subsequently withdrawn. An inflatable band was placed and inflated over the right wrist access site. The vascular sheath was withdrawn and the band was slowly deflated until brisk flow was noted through the arteriotomy site. At this point, the band was reinflated with additional 3 cc of air to obtain  patent hemostasis. FINDINGS: Right radial artery ultrasound and right radial artery angiogram: The caliber of the  right radial artery is appropriate for angiogram access. The right radial artery and the right ulnar artery have normal course and caliber. No significant anatomical variants noted. Right vertebral artery angiograms: Intracranial bilateral vertebral arteries are unremarkable. Luminal irregularity is seen along the bilateral PICAs. 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 Mild luminal irregularity along the right PCA without hemodynamically significant stenosis. Hypoplastic left P1/PCA in the setting of fetal PCA. No aneurysms or abnormally high-flow, early draining veins are seen. No regions of abnormal hypervascularity are noted. The visualized dural sinuses are patent. Left CCA angiograms-cervical views: Atherosclerotic changes of the left carotid bifurcation without hemodynamically significant stenosis. Left CCA angiograms-cranial views: There is brisk vascular contrast filling of the left ACA and MCA vascular trees. There is also brisk opacification of the left PCA vascular tree (fetal PCA). Luminal irregularity consistent with intracranial atherosclerotic disease is seen along the supraclinoid left ICA, left ACA, MCA and PCA. There is mild stenosis at the communicating segment of the left ICA and moderate stenosis of the proximal left M1/MCA segment. Multifocal areas of mild stenosis are seen along the left PCA. No aneurysms or abnormally high-flow, early draining veins are seen. No regions of abnormal hypervascularity are noted. The visualized dural sinuses are patent. Right CCA angiograms: Atherosclerotic changes of the right carotid bifurcation without hemodynamically significant stenosis. Right ICA angiograms: Status post right M1/MCA stenting. There is mild narrowing small hyperplasia within the implant without significant in stent stenosis. The M1 segment remains of small caliber, similar to prior angiogram.  There is brisk vascular contrast filling of the right ACA and MCA vascular trees. Luminal irregularity of the right MCA and ACA vascular tree with multifocal areas of mild stenosis along the the right ACA. There is also mild stenosis at the supraclinoid segment of the right ICA. No aneurysms or abnormally high-flow, early draining veins are seen. No regions of abnormal hypervascularity are noted. The visualized dural sinuses are patent. PROCEDURE: No intervention performed. IMPRESSION: 1. Status post right M1/MCA stenting and angioplasty. The intracranial stent is widely patent without significant stenosis. 2. Atherosclerotic disease of the carotid bifurcation bilaterally, without hemodynamically significant stenosis. 3. Intracranial atherosclerotic disease, more pronounced at the basilar artery where there is moderate to severe stenosis. PLAN: Patient may discontinue the Brilinta from the neurointerventional standpoint. Continuing on aspirin q.d. for life is recommended. Electronically Signed   By: Baldemar Lenis M.D.   On: 11/13/2022 15:37   IR ANGIO VERTEBRAL SEL VERTEBRAL UNI R MOD SED  Result Date: 11/13/2022 INDICATION: [name] is a 72 year old male who presented to the Emergency Department on 04/23/2022 with left-sided weakness; NIHSS 6. He was found to have a proximal right M1/MCA occlusion with underlying atherosclerotic stenosis. He underwent successful mechanical thrombectomy for treatment of the proximal right M1/MCA occlusion with placement of a drug eluting stent in his right MCA treatment of the underlying atherosclerotic high-grade stenosis. He has been taking an aspirin and Brilinta since stent placement. He comes today for diagnostic cerebral angiogram to evaluate stent patency. EXAM: ULTRASOUND-GUIDED VASCULAR ACCESS DIAGNOSTIC CEREBRAL ANGIOGRAM COMPARISON:  Cerebral angiogram intervention April 23, 2022. MEDICATIONS: 5,000 IU heparin, 5 mg Verapamil and 200 mcg  nitroglycerin intra arterial 2 right radial artery. ANESTHESIA/SEDATION: Moderate (conscious) sedation was employed during this procedure. A total of Versed 2 mg  and Fentanyl 100 mcg was administered intravenously by the radiology nurse. Total intra-service moderate Sedation Time: 40 minutes. The patient's level of consciousness and vital signs were monitored continuously by radiology nursing throughout the procedure under my direct supervision. CONTRAST:  60 mL of Omnipaque 300 milligram/mL FLUOROSCOPY: Radiation Exposure Index (as provided by the fluoroscopic device): 359 mGy Kerma COMPLICATIONS: None immediate. TECHNIQUE: Informed written consent was obtained from the patient after a thorough discussion of the procedural risks, benefits and alternatives. All questions were addressed. Maximal Sterile Barrier Technique was utilized including caps, mask, sterile gowns, sterile gloves, sterile drape, hand hygiene and skin antiseptic. A timeout was performed prior to the initiation of the procedure. Using the modified Seldinger technique and a micropuncture kit, access was gained to the right radial artery at the wrist and a 5 French sheath was placed. Real-time ultrasound guidance was utilized for vascular access including the acquisition of a permanent ultrasound image documenting patency of the accessed vessel. Slow intra arterial infusion of 5,000 IU heparin, 5 mg Verapamil and 200 mcg nitroglycerin diluted in patient's own blood was performed. No significant fluctuation in patient's blood pressure seen. Then, a right radial artery angiogram was obtained via sheath side port. Normal brachial artery branching pattern seen. No significant anatomical variation. The right radial artery caliber is adequate for vascular access. Next, a 5 [place] Simmons 2 glide catheter was navigated over a 0.035" Terumo Glidewire into the right subclavian artery under fluoroscopic guidance. Using road map guidance, the catheter was  placed into the right vertebral artery. Frontal and lateral angiograms of the head were obtained. Next, the catheter was placed into the left common carotid artery. Frontal and lateral angiograms of the neck were obtained followed by frontal and lateral angiograms of the head. Then, the catheter was placed into the right common carotid artery. Frontal and lateral angiograms of the neck were obtained. Using biplane roadmap guidance, the catheter was placed into the right internal carotid artery. Frontal, lateral, magnified right anterior oblique and magnified lateral angiograms of the head were obtained. The catheter was subsequently withdrawn. An inflatable band was placed and inflated over the right wrist access site. The vascular sheath was withdrawn and the band was slowly deflated until brisk flow was noted through the arteriotomy site. At this point, the band was reinflated with additional 3 cc of air to obtain patent hemostasis. FINDINGS: Right radial artery ultrasound and right radial artery angiogram: The caliber of the right radial artery is appropriate for angiogram access. The right radial artery and the right ulnar artery have normal course and caliber. No significant anatomical variants noted. Right vertebral artery angiograms: Intracranial bilateral vertebral arteries are unremarkable. Luminal irregularity is seen along the bilateral PICAs. 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 Mild luminal irregularity along the right PCA without hemodynamically significant stenosis. Hypoplastic left P1/PCA in the setting of fetal PCA. No aneurysms or abnormally high-flow, early draining veins are seen. No regions of abnormal hypervascularity are noted. The visualized dural sinuses are patent. Left CCA angiograms-cervical views: Atherosclerotic changes of the left carotid bifurcation without hemodynamically significant  stenosis. Left CCA angiograms-cranial views: There is brisk vascular contrast filling of the left ACA and MCA vascular trees. There is also brisk opacification of the left PCA vascular tree (fetal PCA). Luminal irregularity consistent with intracranial atherosclerotic disease is seen along the supraclinoid left ICA, left ACA, MCA and PCA. There is mild stenosis  at the communicating segment of the left ICA and moderate stenosis of the proximal left M1/MCA segment. Multifocal areas of mild stenosis are seen along the left PCA. No aneurysms or abnormally high-flow, early draining veins are seen. No regions of abnormal hypervascularity are noted. The visualized dural sinuses are patent. Right CCA angiograms: Atherosclerotic changes of the right carotid bifurcation without hemodynamically significant stenosis. Right ICA angiograms: Status post right M1/MCA stenting. There is mild narrowing small hyperplasia within the implant without significant in stent stenosis. The M1 segment remains of small caliber, similar to prior angiogram. There is brisk vascular contrast filling of the right ACA and MCA vascular trees. Luminal irregularity of the right MCA and ACA vascular tree with multifocal areas of mild stenosis along the the right ACA. There is also mild stenosis at the supraclinoid segment of the right ICA. No aneurysms or abnormally high-flow, early draining veins are seen. No regions of abnormal hypervascularity are noted. The visualized dural sinuses are patent. PROCEDURE: No intervention performed. IMPRESSION: 1. Status post right M1/MCA stenting and angioplasty. The intracranial stent is widely patent without significant stenosis. 2. Atherosclerotic disease of the carotid bifurcation bilaterally, without hemodynamically significant stenosis. 3. Intracranial atherosclerotic disease, more pronounced at the basilar artery where there is moderate to severe stenosis. PLAN: Patient may discontinue the Brilinta from the  neurointerventional standpoint. Continuing on aspirin q.d. for life is recommended. Electronically Signed   By: Baldemar Lenis M.D.   On: 11/13/2022 15:37   IR ANGIO INTRA EXTRACRAN SEL COM CAROTID INNOMINATE UNI L MOD SED  Result Date: 11/13/2022 INDICATION: [name] is a 72 year old male who presented to the Emergency Department on 04/23/2022 with left-sided weakness; NIHSS 6. He was found to have a proximal right M1/MCA occlusion with underlying atherosclerotic stenosis. He underwent successful mechanical thrombectomy for treatment of the proximal right M1/MCA occlusion with placement of a drug eluting stent in his right MCA treatment of the underlying atherosclerotic high-grade stenosis. He has been taking an aspirin and Brilinta since stent placement. He comes today for diagnostic cerebral angiogram to evaluate stent patency. EXAM: ULTRASOUND-GUIDED VASCULAR ACCESS DIAGNOSTIC CEREBRAL ANGIOGRAM COMPARISON:  Cerebral angiogram intervention April 23, 2022. MEDICATIONS: 5,000 IU heparin, 5 mg Verapamil and 200 mcg nitroglycerin intra arterial 2 right radial artery. ANESTHESIA/SEDATION: Moderate (conscious) sedation was employed during this procedure. A total of Versed 2 mg and Fentanyl 100 mcg was administered intravenously by the radiology nurse. Total intra-service moderate Sedation Time: 40 minutes. The patient's level of consciousness and vital signs were monitored continuously by radiology nursing throughout the procedure under my direct supervision. CONTRAST:  60 mL of Omnipaque 300 milligram/mL FLUOROSCOPY: Radiation Exposure Index (as provided by the fluoroscopic device): 359 mGy Kerma COMPLICATIONS: None immediate. TECHNIQUE: Informed written consent was obtained from the patient after a thorough discussion of the procedural risks, benefits and alternatives. All questions were addressed. Maximal Sterile Barrier Technique was utilized including caps, mask, sterile gowns,  sterile gloves, sterile drape, hand hygiene and skin antiseptic. A timeout was performed prior to the initiation of the procedure. Using the modified Seldinger technique and a micropuncture kit, access was gained to the right radial artery at the wrist and a 5 French sheath was placed. Real-time ultrasound guidance was utilized for vascular access including the acquisition of a permanent ultrasound image documenting patency of the accessed vessel. Slow intra arterial infusion of 5,000 IU heparin, 5 mg Verapamil and 200 mcg nitroglycerin diluted in patient's own blood was performed. No significant  fluctuation in patient's blood pressure seen. Then, a right radial artery angiogram was obtained via sheath side port. Normal brachial artery branching pattern seen. No significant anatomical variation. The right radial artery caliber is adequate for vascular access. Next, a 5 [place] Simmons 2 glide catheter was navigated over a 0.035" Terumo Glidewire into the right subclavian artery under fluoroscopic guidance. Using road map guidance, the catheter was placed into the right vertebral artery. Frontal and lateral angiograms of the head were obtained. Next, the catheter was placed into the left common carotid artery. Frontal and lateral angiograms of the neck were obtained followed by frontal and lateral angiograms of the head. Then, the catheter was placed into the right common carotid artery. Frontal and lateral angiograms of the neck were obtained. Using biplane roadmap guidance, the catheter was placed into the right internal carotid artery. Frontal, lateral, magnified right anterior oblique and magnified lateral angiograms of the head were obtained. The catheter was subsequently withdrawn. An inflatable band was placed and inflated over the right wrist access site. The vascular sheath was withdrawn and the band was slowly deflated until brisk flow was noted through the arteriotomy site. At this point, the band was  reinflated with additional 3 cc of air to obtain patent hemostasis. FINDINGS: Right radial artery ultrasound and right radial artery angiogram: The caliber of the right radial artery is appropriate for angiogram access. The right radial artery and the right ulnar artery have normal course and caliber. No significant anatomical variants noted. Right vertebral artery angiograms: Intracranial bilateral vertebral arteries are unremarkable. Luminal irregularity is seen along the bilateral PICAs. 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 Mild luminal irregularity along the right PCA without hemodynamically significant stenosis. Hypoplastic left P1/PCA in the setting of fetal PCA. No aneurysms or abnormally high-flow, early draining veins are seen. No regions of abnormal hypervascularity are noted. The visualized dural sinuses are patent. Left CCA angiograms-cervical views: Atherosclerotic changes of the left carotid bifurcation without hemodynamically significant stenosis. Left CCA angiograms-cranial views: There is brisk vascular contrast filling of the left ACA and MCA vascular trees. There is also brisk opacification of the left PCA vascular tree (fetal PCA). Luminal irregularity consistent with intracranial atherosclerotic disease is seen along the supraclinoid left ICA, left ACA, MCA and PCA. There is mild stenosis at the communicating segment of the left ICA and moderate stenosis of the proximal left M1/MCA segment. Multifocal areas of mild stenosis are seen along the left PCA. No aneurysms or abnormally high-flow, early draining veins are seen. No regions of abnormal hypervascularity are noted. The visualized dural sinuses are patent. Right CCA angiograms: Atherosclerotic changes of the right carotid bifurcation without hemodynamically significant stenosis. Right ICA angiograms: Status post right M1/MCA stenting. There is  mild narrowing small hyperplasia within the implant without significant in stent stenosis. The M1 segment remains of small caliber, similar to prior angiogram. There is brisk vascular contrast filling of the right ACA and MCA vascular trees. Luminal irregularity of the right MCA and ACA vascular tree with multifocal areas of mild stenosis along the the right ACA. There is also mild stenosis at the supraclinoid segment of the right ICA. No aneurysms or abnormally high-flow, early draining veins are seen. No regions of abnormal hypervascularity are noted. The visualized dural sinuses are patent. PROCEDURE: No intervention performed. IMPRESSION: 1. Status post right M1/MCA stenting and angioplasty. The intracranial stent is widely patent without significant stenosis.  2. Atherosclerotic disease of the carotid bifurcation bilaterally, without hemodynamically significant stenosis. 3. Intracranial atherosclerotic disease, more pronounced at the basilar artery where there is moderate to severe stenosis. PLAN: Patient may discontinue the Brilinta from the neurointerventional standpoint. Continuing on aspirin q.d. for life is recommended. Electronically Signed   By: Baldemar Lenis M.D.   On: 11/13/2022 15:37    EKG: SR no acute ST changes    ASSESSMENT AND PLAN:   Chest Pain:  ? SEMI no acute ECG changes start heparin Home meds include Torpl 50 mg Does have PPM back up. TTE to assess EF Continue ASA and Brilinta Trend troponins  No history of CAD but smoker with HTN and recent CVA indicating vascular dx.   Shared Decision Making/Informed Consent The risks [stroke (1 in 1000), death (1 in 1000), kidney failure [usually temporary] (1 in 500), bleeding (1 in 200), allergic reaction [possibly serious] (1 in 200)], benefits (diagnostic support and management of coronary artery disease) and alternatives of a cardiac catheterization were discussed in detail with Ms. Wanita Chamberlain and she is willing to proceed.   2.   CVA:  recent with stenting no bleed Complete resolution of focal deficits Avoid use of Effient continue Brilinta/ASA  3.  HTN:  continue norvasc   4. Smoking:  CXR NAD emphysema   5.  PPM:  normal function f/u Mealor   Signed: Charlton Haws 11/15/2022, 10:05 AM

## 2022-11-15 NOTE — ED Notes (Signed)
Cardiologist at the bedside discussing tx plan with pt.

## 2022-11-15 NOTE — ED Notes (Signed)
RN educated pt on heparin use. Pt verbalized understanding to notify staff with callbell when pt needs to use restroom or ambulate out bed. Pt family at bedside verbalize understanding. No additional questions at this time.

## 2022-11-15 NOTE — ED Notes (Signed)
Pt provided pillow and warm blanket

## 2022-11-15 NOTE — Progress Notes (Signed)
ANTICOAGULATION CONSULT NOTE - Follow Up Consult  Pharmacy Consult for heparin Indication: chest pain/ACS  Allergies  Allergen Reactions   Ace Inhibitors Swelling and Other (See Comments)    Patient presented with right upper lip swelling along with tingling and numbness. Possible allergic reaction to ACEI.    Patient Measurements: Height: 6' 1.25" (186.1 cm) Weight: 66.4 kg (146 lb 6.4 oz) IBW/kg (Calculated) : 80.48 Heparin Dosing Weight: TBW  Vital Signs: Temp: 98.6 F (37 C) (09/08 1453) Temp Source: Oral (09/08 1453) BP: 175/115 (09/08 1755) Pulse Rate: 63 (09/08 1755)  Labs: Recent Labs    11/15/22 0812 11/15/22 1020 11/15/22 1829  HGB 13.3  --   --   HCT 39.3  --   --   PLT 216  --   --   HEPARINUNFRC  --   --  0.42  CREATININE 1.05  --   --   TROPONINIHS 626* 1,678*  --     Estimated Creatinine Clearance: 58.8 mL/min (by C-G formula based on SCr of 1.05 mg/dL).   Medical History: Past Medical History:  Diagnosis Date   Atrial tachycardia 04/11/2012   Bilateral flank pain 06/26/2020   CEREBROVASCULAR ACCIDENT 10/17/2008   Acute CVA of right thalamus 10/03/08 ECHO performed 2/2 CVA 10/09/08, EF 55-65%  Aspirin 325 mg daily and try to work on quitting smoking.    Emphysema    per multiple CXRs   Hypertension    goal < 140/90   Sickle cell trait (HCC)    Snoring    Sleep study 09/02/10 was WNL   Stroke (HCC) 10/03/2008   Right thalamus   TIA (transient ischemic attack) 12/25/2021   Tuberculosis at age 55-3    Assessment: 46 YOM presenting with CP, elevated troponin, he is not on anticoagulation PTA, CBC wnl  PM: first heparin level 0.42 (therapeutic) on heparin 850 units/hr. No bleeding or issues with the infusion reported.  Goal of Therapy:  Heparin level 0.3-0.7 units/ml Monitor platelets by anticoagulation protocol: Yes   Plan:  Continue heparin gtt at 850 units/hr F/u confirmatory 8 hour heparin level Daily heparin level and CBC Monitor for  signs/symptoms of bleeding  Loralee Pacas, PharmD, BCPS 11/15/2022 7:23 PM  Please check AMION for all [facility] phone numbers After 10:00 PM, call Main Pharmacy [phone]

## 2022-11-16 ENCOUNTER — Inpatient Hospital Stay (HOSPITAL_COMMUNITY): Payer: 59

## 2022-11-16 ENCOUNTER — Encounter (HOSPITAL_COMMUNITY)
Admission: EM | Disposition: A | Discharge status: Home / Self Care | Payer: Self-pay | Source: Home / Self Care | Attending: Internal Medicine

## 2022-11-16 ENCOUNTER — Ambulatory Visit: Payer: Self-pay

## 2022-11-16 ENCOUNTER — Other Ambulatory Visit (HOSPITAL_COMMUNITY): Payer: Self-pay

## 2022-11-16 DIAGNOSIS — R079 Chest pain, unspecified: Secondary | ICD-10-CM | POA: Diagnosis not present

## 2022-11-16 DIAGNOSIS — I2510 Atherosclerotic heart disease of native coronary artery without angina pectoris: Secondary | ICD-10-CM | POA: Diagnosis not present

## 2022-11-16 DIAGNOSIS — I214 Non-ST elevation (NSTEMI) myocardial infarction: Secondary | ICD-10-CM | POA: Diagnosis not present

## 2022-11-16 DIAGNOSIS — Z950 Presence of cardiac pacemaker: Secondary | ICD-10-CM

## 2022-11-16 DIAGNOSIS — F17210 Nicotine dependence, cigarettes, uncomplicated: Secondary | ICD-10-CM

## 2022-11-16 DIAGNOSIS — I10 Essential (primary) hypertension: Secondary | ICD-10-CM

## 2022-11-16 DIAGNOSIS — F1410 Cocaine abuse, uncomplicated: Secondary | ICD-10-CM

## 2022-11-16 HISTORY — PX: LEFT HEART CATH AND CORONARY ANGIOGRAPHY: CATH118249

## 2022-11-16 HISTORY — PX: CORONARY STENT INTERVENTION: CATH118234

## 2022-11-16 LAB — HEPARIN LEVEL (UNFRACTIONATED): Heparin Unfractionated: 0.34 [IU]/mL (ref 0.30–0.70)

## 2022-11-16 LAB — CBC
HCT: 35.8 % — ABNORMAL LOW (ref 39.0–52.0)
Hemoglobin: 12.6 g/dL — ABNORMAL LOW (ref 13.0–17.0)
MCH: 29.4 pg (ref 26.0–34.0)
MCHC: 35.2 g/dL (ref 30.0–36.0)
MCV: 83.6 fL (ref 80.0–100.0)
Platelets: 195 10*3/uL (ref 150–400)
RBC: 4.28 MIL/uL (ref 4.22–5.81)
RDW: 16.1 % — ABNORMAL HIGH (ref 11.5–15.5)
WBC: 5.6 10*3/uL (ref 4.0–10.5)
nRBC: 0 % (ref 0.0–0.2)

## 2022-11-16 LAB — ECHOCARDIOGRAM COMPLETE
AR max vel: 2.74 cm2
AV Peak grad: 2.9 mmHg
Ao pk vel: 0.85 m/s
Area-P 1/2: 3.65 cm2
Height: 73.25 in
MV M vel: 2.23 m/s
MV Peak grad: 19.9 mmHg
S' Lateral: 2.5 cm
Weight: 2342.4 [oz_av]

## 2022-11-16 LAB — BASIC METABOLIC PANEL
Anion gap: 8 (ref 5–15)
BUN: 7 mg/dL — ABNORMAL LOW (ref 8–23)
CO2: 24 mmol/L (ref 22–32)
Calcium: 9.2 mg/dL (ref 8.9–10.3)
Chloride: 105 mmol/L (ref 98–111)
Creatinine, Ser: 0.87 mg/dL (ref 0.61–1.24)
GFR, Estimated: >60 mL/min (ref >60)
Glucose, Bld: 109 mg/dL — ABNORMAL HIGH (ref 70–99)
Potassium: 3.4 mmol/L — ABNORMAL LOW (ref 3.5–5.1)
Sodium: 137 mmol/L (ref 135–145)

## 2022-11-16 LAB — POCT ACTIVATED CLOTTING TIME
Activated Clotting Time: 336 s
Activated Clotting Time: 281 s
Activated Clotting Time: 318 s

## 2022-11-16 SURGERY — LEFT HEART CATH AND CORONARY ANGIOGRAPHY
Anesthesia: LOCAL

## 2022-11-16 MED ORDER — VERAPAMIL HCL 2.5 MG/ML IV SOLN
INTRAVENOUS | Status: DC | PRN
  Administered 2022-11-16: 10 mL via INTRA_ARTERIAL

## 2022-11-16 MED ORDER — SPIRONOLACTONE 25 MG PO TABS
25.0000 mg | ORAL_TABLET | Freq: Every day | ORAL | 0 refills | Status: DC
Start: 2022-11-16 — End: 2022-11-18
  Filled 2022-11-16: qty 30, 30d supply, fill #0

## 2022-11-16 MED ORDER — LIDOCAINE HCL (PF) 1 % IJ SOLN
INTRAMUSCULAR | Status: DC | PRN
  Administered 2022-11-16: 2 mL

## 2022-11-16 MED ORDER — HYDRALAZINE HCL 20 MG/ML IJ SOLN: 10.0000 mg | INTRAMUSCULAR | Status: AC | PRN

## 2022-11-16 MED ORDER — ASPIRIN 81 MG PO TBEC
81.0000 mg | DELAYED_RELEASE_TABLET | Freq: Every day | ORAL | 12 refills | Status: AC
  Filled 2022-11-16: qty 120, 120d supply, fill #0

## 2022-11-16 MED ORDER — AMLODIPINE BESYLATE 5 MG PO TABS
5.0000 mg | ORAL_TABLET | Freq: Every day | ORAL | Status: DC
  Administered 2022-11-16 – 2022-11-17 (×2): 5 mg via ORAL
  Filled 2022-11-16 (×2): qty 1

## 2022-11-16 MED ORDER — SODIUM CHLORIDE 0.9 % IV SOLN: INTRAVENOUS | Status: AC

## 2022-11-16 MED ORDER — MIDAZOLAM HCL 2 MG/2ML IJ SOLN
INTRAMUSCULAR | Status: AC
  Filled 2022-11-16: qty 2

## 2022-11-16 MED ORDER — ISOSORB DINITRATE-HYDRALAZINE 20-37.5 MG PO TABS
1.0000 | ORAL_TABLET | Freq: Three times a day (TID) | ORAL | Status: DC
  Administered 2022-11-16 – 2022-11-17 (×3): 1 via ORAL
  Filled 2022-11-16 (×3): qty 1

## 2022-11-16 MED ORDER — SODIUM CHLORIDE 0.9% FLUSH
3.0000 mL | Freq: Two times a day (BID) | INTRAVENOUS | Status: DC
  Administered 2022-11-16 – 2022-11-18 (×3): 3 mL via INTRAVENOUS

## 2022-11-16 MED ORDER — NITROGLYCERIN 0.4 MG SL SUBL
0.4000 mg | SUBLINGUAL_TABLET | SUBLINGUAL | 0 refills | Status: AC | PRN
  Filled 2022-11-16: qty 25, 7d supply, fill #0

## 2022-11-16 MED ORDER — NITROGLYCERIN 1 MG/10 ML FOR IR/CATH LAB
INTRA_ARTERIAL | Status: AC
  Filled 2022-11-16: qty 10

## 2022-11-16 MED ORDER — SODIUM CHLORIDE 0.9 % IV SOLN: 250.0000 mL | INTRAVENOUS | Status: DC | PRN

## 2022-11-16 MED ORDER — NITROGLYCERIN IN D5W 200-5 MCG/ML-% IV SOLN
2.0000 ug/min | INTRAVENOUS | Status: DC
  Administered 2022-11-16: 5 ug/min via INTRAVENOUS
  Filled 2022-11-16: qty 250

## 2022-11-16 MED ORDER — ASPIRIN 81 MG PO CHEW
81.0000 mg | CHEWABLE_TABLET | Freq: Every day | ORAL | Status: DC
  Administered 2022-11-17 – 2022-11-18 (×2): 81 mg via ORAL
  Filled 2022-11-16 (×2): qty 1

## 2022-11-16 MED ORDER — ACETAMINOPHEN 325 MG PO TABS
650.0000 mg | ORAL_TABLET | Freq: Four times a day (QID) | ORAL | Status: DC | PRN
  Administered 2022-11-16 – 2022-11-18 (×5): 650 mg via ORAL
  Filled 2022-11-16 (×5): qty 2

## 2022-11-16 MED ORDER — NITROGLYCERIN 1 MG/10 ML FOR IR/CATH LAB
INTRA_ARTERIAL | Status: DC | PRN
  Administered 2022-11-16: 200 ug via INTRACORONARY

## 2022-11-16 MED ORDER — AMLODIPINE BESYLATE 5 MG PO TABS
5.0000 mg | ORAL_TABLET | Freq: Every day | ORAL | 0 refills | Status: DC
  Filled 2022-11-16: qty 30, 30d supply, fill #0

## 2022-11-16 MED ORDER — HEPARIN SODIUM (PORCINE) 1000 UNIT/ML IJ SOLN
INTRAMUSCULAR | Status: AC
  Filled 2022-11-16: qty 10

## 2022-11-16 MED ORDER — FENTANYL CITRATE (PF) 100 MCG/2ML IJ SOLN
INTRAMUSCULAR | Status: DC | PRN
  Administered 2022-11-16 (×2): 25 ug via INTRAVENOUS

## 2022-11-16 MED ORDER — TICAGRELOR 90 MG PO TABS
ORAL_TABLET | ORAL | Status: AC
  Filled 2022-11-16: qty 1

## 2022-11-16 MED ORDER — HEPARIN (PORCINE) IN NACL 1000-0.9 UT/500ML-% IV SOLN
INTRAVENOUS | Status: DC | PRN
  Administered 2022-11-16 (×2): 500 mL

## 2022-11-16 MED ORDER — ONDANSETRON HCL 4 MG/2ML IJ SOLN
4.0000 mg | Freq: Once | INTRAMUSCULAR | Status: AC
  Administered 2022-11-16: 4 mg via INTRAVENOUS
  Filled 2022-11-16: qty 2

## 2022-11-16 MED ORDER — FLUTICASONE PROPIONATE 50 MCG/ACT NA SUSP
1.0000 | Freq: Every day | NASAL | 0 refills | Status: DC
  Filled 2022-11-16: qty 16, 30d supply, fill #0

## 2022-11-16 MED ORDER — HEPARIN SODIUM (PORCINE) 1000 UNIT/ML IJ SOLN
INTRAMUSCULAR | Status: DC | PRN
  Administered 2022-11-16: 3000 [IU] via INTRAVENOUS
  Administered 2022-11-16: 2000 [IU] via INTRAVENOUS
  Administered 2022-11-16: 3500 [IU] via INTRAVENOUS

## 2022-11-16 MED ORDER — MORPHINE SULFATE (PF) 2 MG/ML IV SOLN: 1.0000 mg | INTRAVENOUS | Status: DC | PRN

## 2022-11-16 MED ORDER — LIDOCAINE HCL (PF) 1 % IJ SOLN
INTRAMUSCULAR | Status: AC
  Filled 2022-11-16: qty 30

## 2022-11-16 MED ORDER — TICAGRELOR 90 MG PO TABS
90.0000 mg | ORAL_TABLET | Freq: Two times a day (BID) | ORAL | Status: DC
  Administered 2022-11-16 – 2022-11-18 (×4): 90 mg via ORAL
  Filled 2022-11-16 (×4): qty 1

## 2022-11-16 MED ORDER — INCRUSE ELLIPTA 62.5 MCG/ACT IN AEPB
1.0000 | INHALATION_SPRAY | Freq: Every day | RESPIRATORY_TRACT | 2 refills | Status: DC
  Filled 2022-11-16: qty 90, 90d supply, fill #0

## 2022-11-16 MED ORDER — TICAGRELOR 90 MG PO TABS
90.0000 mg | ORAL_TABLET | Freq: Two times a day (BID) | ORAL | 1 refills | Status: DC
  Filled 2022-11-16: qty 60, 30d supply, fill #0

## 2022-11-16 MED ORDER — EZETIMIBE 10 MG PO TABS
10.0000 mg | ORAL_TABLET | Freq: Every day | ORAL | 0 refills | Status: DC
  Filled 2022-11-16: qty 30, 30d supply, fill #0

## 2022-11-16 MED ORDER — MIDAZOLAM HCL 2 MG/2ML IJ SOLN
INTRAMUSCULAR | Status: DC | PRN
  Administered 2022-11-16 (×2): 1 mg via INTRAVENOUS

## 2022-11-16 MED ORDER — OXYCODONE HCL 5 MG PO TABS
5.0000 mg | ORAL_TABLET | Freq: Four times a day (QID) | ORAL | Status: DC | PRN
  Administered 2022-11-16 – 2022-11-18 (×5): 5 mg via ORAL
  Filled 2022-11-16 (×5): qty 1

## 2022-11-16 MED ORDER — ATORVASTATIN CALCIUM 80 MG PO TABS
80.0000 mg | ORAL_TABLET | Freq: Every day | ORAL | 0 refills | Status: DC
  Filled 2022-11-16: qty 30, 30d supply, fill #0

## 2022-11-16 MED ORDER — ALBUTEROL SULFATE HFA 108 (90 BASE) MCG/ACT IN AERS
2.0000 | INHALATION_SPRAY | RESPIRATORY_TRACT | 0 refills | Status: DC | PRN
  Filled 2022-11-16: qty 18, 30d supply, fill #0

## 2022-11-16 MED ORDER — ENOXAPARIN SODIUM 40 MG/0.4ML IJ SOSY
40.0000 mg | PREFILLED_SYRINGE | INTRAMUSCULAR | Status: DC
  Filled 2022-11-16: qty 0.4

## 2022-11-16 MED ORDER — TICAGRELOR 90 MG PO TABS
ORAL_TABLET | ORAL | Status: DC | PRN
  Administered 2022-11-16: 90 mg via ORAL

## 2022-11-16 MED ORDER — LABETALOL HCL 5 MG/ML IV SOLN: 10.0000 mg | INTRAVENOUS | Status: AC | PRN

## 2022-11-16 MED ORDER — METOPROLOL SUCCINATE ER 50 MG PO TB24
50.0000 mg | ORAL_TABLET | Freq: Every day | ORAL | 0 refills | Status: DC
  Filled 2022-11-16: qty 30, 30d supply, fill #0

## 2022-11-16 MED ORDER — IOHEXOL 350 MG/ML SOLN
INTRAVENOUS | Status: DC | PRN
  Administered 2022-11-16: 180 mL

## 2022-11-16 MED ORDER — VERAPAMIL HCL 2.5 MG/ML IV SOLN
INTRAVENOUS | Status: AC
  Filled 2022-11-16: qty 2

## 2022-11-16 MED ORDER — SODIUM CHLORIDE 0.9% FLUSH: 3.0000 mL | INTRAVENOUS | Status: DC | PRN

## 2022-11-16 MED ORDER — FENTANYL CITRATE (PF) 100 MCG/2ML IJ SOLN
INTRAMUSCULAR | Status: AC
  Filled 2022-11-16: qty 2

## 2022-11-16 MED ORDER — TICAGRELOR 90 MG PO TABS: 90.0000 mg | ORAL_TABLET | Freq: Every day | ORAL | Status: DC

## 2022-11-16 SURGICAL SUPPLY — 20 items
BALLN EMERGE MR 2.0X12 (BALLOONS) ×1
BALLN ~~LOC~~ EMERGE MR 2.5X12 (BALLOONS) ×1
BALLOON EMERGE MR 2.0X12 (BALLOONS) IMPLANT
BALLOON TAKERU 1.5X6 (BALLOONS) IMPLANT
BALLOON TAKERU 2.0X12 (BALLOONS) IMPLANT
BALLOON ~~LOC~~ EMERGE MR 2.5X12 (BALLOONS) IMPLANT
CATH 5FR JL3.5 JR4 ANG PIG MP (CATHETERS) IMPLANT
CATH INFINITI 5FR JL4 (CATHETERS) IMPLANT
CATH LAUNCHER 6FR AL.75 (CATHETERS) IMPLANT
CATH LAUNCHER 6FR AR2 (CATHETERS) IMPLANT
DEVICE RAD COMP TR BAND LRG (VASCULAR PRODUCTS) IMPLANT
GLIDESHEATH SLEND SS 6F .021 (SHEATH) IMPLANT
GUIDEWIRE INQWIRE 1.5J.035X260 (WIRE) IMPLANT
INQWIRE 1.5J .035X260CM (WIRE) ×2
KIT ENCORE 26 ADVANTAGE (KITS) IMPLANT
KIT SYRINGE INJ CVI SPIKEX1 (MISCELLANEOUS) IMPLANT
PACK CARDIAC CATHETERIZATION (CUSTOM PROCEDURE TRAY) ×1 IMPLANT
SET ATX-X65L (MISCELLANEOUS) IMPLANT
STENT ONYX FRONTIER 2.25X22 (Permanent Stent) IMPLANT
WIRE RUNTHROUGH .014X180CM (WIRE) IMPLANT

## 2022-11-16 NOTE — Progress Notes (Signed)
   Cardiologist:  Mealor  Subjective:  Seen prior to cath all questions answered   Objective:  Vitals:   11/16/22 0315 11/16/22 0346 11/16/22 0400 11/16/22 0737  BP: (!) 169/105 (!) 159/127    Pulse: 60 (!) 45    Resp:  17    Temp:  98.7 F (37.1 C)    TempSrc:  Oral    SpO2: 100% 96% 96% 100%  Weight:      Height:        Intake/Output from previous day:  Intake/Output Summary (Last 24 hours) at 11/16/2022 0817 Last data filed at 11/16/2022 0524 Gross per 24 hour  Intake 637.75 ml  Output 1050 ml  Net -412.25 ml    Physical Exam: Affect appropriate Healthy:  appears stated age HEENT: normal Neck supple with no adenopathy JVP normal no bruits no thyromegaly Lungs clear with no wheezing and good diaphragmatic motion Heart:  S1/S2 no murmur, no rub, gallop or click PMI normal Abdomen: benighn, BS positve, no tenderness, no AAA no bruit.  No HSM or HJR Distal pulses intact with no bruits No edema Neuro non-focal Skin warm and dry No muscular weakness   Lab Results: Basic Metabolic Panel: Recent Labs    11/15/22 0812 11/15/22 1836 11/16/22 0448  NA 141  --  137  K 3.1*  --  3.4*  CL 102  --  105  CO2 26  --  24  GLUCOSE 125*  --  109*  BUN 8  --  7*  CREATININE 1.05  --  0.87  CALCIUM 9.5  --  9.2  MG  --  1.9  --    Liver Function Tests: No results for input(s): "AST", "ALT", "ALKPHOS", "BILITOT", "PROT", "ALBUMIN" in the last 72 hours. No results for input(s): "LIPASE", "AMYLASE" in the last 72 hours. CBC: Recent Labs    11/15/22 0812 11/16/22 0448  WBC 5.4 5.6  HGB 13.3 12.6*  HCT 39.3 35.8*  MCV 85.1 83.6  PLT 216 195   Fasting Lipid Panel: Recent Labs    11/15/22 1020  CHOL 163  HDL 58  LDLCALC 95  TRIG 49  CHOLHDL 2.8     Imaging: CXR   Cardiac Studies:  ECG: SR no acute changes    Telemetry:  NSR 11/16/2022   Echo: 04/08/22 EF 65-70%    Assessment/Plan:   Chest Pain: ? SEMI no acute ECG changes Continue heparin on call  to cath lab.  Home meds include Torpl 50 mg Does have PPM back up. TTE to assess EF Continue ASA and Brilinta last troponin up to 24,000  No history of CAD but smoker with HTN and recent CVA indicating vascular dx.  CVA:  DAT no bleed post thrombectomy /stenting fo right  M1/MCA full resolution of neurologic deficits PPM:  normal function f/u Mealor   Charlton Haws 11/16/2022, 8:17 AM

## 2022-11-16 NOTE — Progress Notes (Signed)
Received message from Amy RN around 2:30 AM stating patient had a headache and BP remained elevated s/p hydralazine 25 mg. Since admission, patient was started on amlodipine 5 mg, metoprolol 12.5 mg, and hydralazine 25 mg PRN q6H. Assessed patient at bedside with Dr. Sherrilee Gilles. Patient resting in bed in no acute distress. Patient stated headache was on left side of head but was slowly improving on its own. Alert and oriented to self, place, situation, and date. Denied any CP, vision changes, or SOB (sat 100% on room air). Endorsed difficulty sleeping. Cycled BP in room, 165/105, slightly improved from previous BP after hydralazine dose. Vitals stable. Reassured patient, discussed plan for heart cath 9/9 and patient amenable to trying Tylenol for headache. Patient endorsed understanding. Will continue to monitor.   Breyer Tejera Colbert Coyer, MD PGY-1 IMTP

## 2022-11-16 NOTE — Progress Notes (Signed)
Asked to see patient for SSCP post cath Reviewed angiogram. Had stent to PLB with good result TIMI 3 flow no distal embolus Residual moderate OM/D1 dx Has had MI  Exam patient is HTN 180 mmHg systolic A pacing PPM under left clavicle No focal neuro deficits Lungs clear No murmur Radial band in place  ECG no acute ST elevation or changes  Plan:  IV nitro MSo4 PRN May have some post infarct pain No need to consider going back to lab.   Trend troponin make sure coming down ECG in am.   Discussed cocaine use prior to stroke He is hesitant to admit But wife shakes her head indicating active use  Discussed with nurse  Critical Care Time 30 minutes   Charlton Haws MD [facility]

## 2022-11-16 NOTE — Progress Notes (Addendum)
Subjective Patient is feeling well after procedure and states he understands the importance of medication adherence upon discharge.   Interval History: Cardiology consulted this afternoon because patient endorsed chest pain. Per Cardiology, ECG showed no acute ST changes, patient started on Nitroglycerin infusion and Morphine prn.  Physical exam Blood pressure (!) 162/112, pulse 62, temperature 97.6 F (36.4 C), temperature source Oral, resp. rate 16, height 6' 1.25" (1.861 m), weight 66.4 kg, SpO2 99%.  Constitutional: well-appearing, lying in bed, in no acute distress Cardiovascular: regular rate and rhythm, no m/r/g Pulmonary/Chest: normal work of breathing on room air, lungs clear to auscultation bilaterally Skin: warm and dry Psych: normal mood and behavior   Weight change:    Intake/Output Summary (Last 24 hours) at 11/16/2022 1434 Last data filed at 11/16/2022 1323 Gross per 24 hour  Intake 637.75 ml  Output 1800 ml  Net -1162.25 ml   Net IO Since Admission: -1,162.25 mL [11/16/22 1434]  Labs, images, and other studies    Latest Ref Rng & Units 11/16/2022    4:48 AM 11/15/2022    8:12 AM 11/12/2022    8:20 AM  BMP  Glucose 70 - 99 mg/dL 324  401  98   BUN 8 - 23 mg/dL 7  8  10    Creatinine 0.61 - 1.24 mg/dL 0.27  2.53  6.64   Sodium 135 - 145 mmol/L 137  141  142   Potassium 3.5 - 5.1 mmol/L 3.4  3.1  3.5   Chloride 98 - 111 mmol/L 105  102  104   CO2 22 - 32 mmol/L 24  26  25    Calcium 8.9 - 10.3 mg/dL 9.2  9.5  9.0        Latest Ref Rng & Units 11/16/2022    4:48 AM 11/15/2022    8:12 AM 11/12/2022    7:46 AM  CBC  WBC 4.0 - 10.5 K/uL 5.6  5.4  3.5   Hemoglobin 13.0 - 17.0 g/dL 40.3  47.4  25.9   Hematocrit 39.0 - 52.0 % 35.8  39.3  34.1   Platelets 150 - 400 K/uL 195  216  203     Assessment and plan Hospital day 1  [name] is a 72 y.o.admitted 9/8 for NSTEMI s/p PCI to rPL1 branch w/ drug eluting stent on 9/9.  Principal Problem:    Chest pain with high risk for cardiac etiology Active Problems:   Essential hypertension   COPD (chronic obstructive pulmonary disease) (HCC)   Sinoatrial node dysfunction (HCC)   History of CVA (cerebrovascular accident)   Paroxysmal atrial fibrillation (HCC)   Cocaine use disorder (HCC)   #NSTEMI #CAD, s/p PCI this admission to PLB and residual moderate OM/D1  #Hx SSS s/p PMI  #Chronic crack cocaine abuse Today patient underwent successful PCI to rPLI which was 95% stenosed. Patient doing well this morning, but started having chest pain this afternoon. Cardiology consulted and noted no changes on ECG, started Nitroglycerin drip/morphine prn, and will plan to repeat ECG tomorrow morning. Echo completed and results pending.  -Continue ASA, Lipitor, Lopressor, Brilinta in addition to above  -Trend troponin to ensure decrease -Appreciate Cardiology recommendations   #HTN BP's 150-180's/110-130's; patient asymptomatic aside from chest pain -Continue Amlodipine 5, Lopressor 12.5 BID -Per Cardiology start: Nitroglycerin drip and Bidil 20-37.5 TID -Hydralazine prn -Trend vitals   #HLD -Continue home Lipitor 80 and  Zetia 10 -LDL on admission is 95; above goal for secondary prevention   #COPD #Tobacco use Unremarkable physical exam. Emphysema noted on Chest X-ray. -Continue home Incruse Elipta and Albuterol nebulizer prn -Low-dose lung CT 07/2020 notes Lung-RAD 2, benign appearance and recommended follow-up scan in 12 months -Tobacco cessation counseling    #Cocaine Use Disorder -Advised cessation especially in the setting of his NSTEMI and CAD -Outpatient counseling   #Hypokalemia 3.4. Klor-Con 40 mEq given; Mg 1.9. -Trend BMP  Diet: Heart Healthy VTE: enoxaparin (LOVENOX) injection 40 mg Start: 11/17/22 0800  Code: DNR-Limited   Discharge plan: Pending clinical improvement   Carmina Miller, DO 11/16/2022, 2:34 PM  Pager: [phone] After 5pm or weekend: [phone]

## 2022-11-16 NOTE — TOC CM/SW Note (Signed)
Transition of Care [facility]) - Inpatient Brief Assessment   Patient Details  Name: [name] MRN: [id number] Date of Birth: October 09, 1950  [facility] CM/SW Contact:    Gala Lewandowsky, RN Phone Number: 11/16/2022, 12:31 PM   Clinical Narrative: Patient presented for chest pain-post cath. Benefits check submitted for Brilinta-will follow for cost. PTA patient was independent from home with spouse. No home needs identified at the time of visit. Case Manager will continue to follow for additional needs as the patient progresses.    Transition of Care Asessment: Insurance and Status: Insurance coverage has been reviewed Patient has primary care physician: Yes Home environment has been reviewed: reviewed Prior level of function:: independent Prior/Current Home Services: No current home services Social Determinants of Health Reivew: SDOH reviewed no interventions necessary Readmission risk has been reviewed: Yes Transition of care needs: no transition of care needs at this time

## 2022-11-16 NOTE — Discharge Instructions (Signed)
You were treated for an NSTEMI (heart attack) caused by a blockage in one of the arteries in your heart. This was treated by putting a stent to keep that artery open. Please take medications as prescribed and follow-up with both cardiology and the [facility] appointment we have made for you with Dr. Mikey Bussing on 9/12 @ 9:45.  Avoid medications like NSAIDs (ibuprofen, Advil, naproxen, Aleve, etc.) as these can increase your risk for heart attack. The best thing you can do for your health is quit smoking today.  If you experience similar chest pain please come back to the ED.

## 2022-11-16 NOTE — Progress Notes (Signed)
[facility]  Patient has agreed to rescind his DNR order for cardiac catheterization.  Prior DNR order with limited interventions will resume after procedure.  Yvonne Kendall, MD [facility] 11/16/22 7:38 AM

## 2022-11-16 NOTE — TOC Benefit Eligibility Note (Signed)
Patient Product/process development scientist completed.    The patient is insured through [employer]. Patient has Medicare and is not eligible for a copay card, but may be able to apply for patient assistance, if available.    Ran test claim for Brilinta 90 mg and the current 30 day co-pay is $0.00.   This test claim was processed through [facility]- copay amounts may vary at other pharmacies due to pharmacy/plan contracts, or as the patient moves through the different stages of their insurance plan.     Roland Earl, CPHT Pharmacy Technician III Certified Patient Advocate [facility] Patient Advocate Team Direct Number: [phone]  Fax: [phone]

## 2022-11-16 NOTE — Progress Notes (Signed)
Echocardiogram 2D Echocardiogram has been performed.  Lucendia Herrlich 11/16/2022, 2:32 PM

## 2022-11-16 NOTE — Interval H&P Note (Signed)
History and Physical Interval Note:  11/16/2022 7:35 AM  [name]  has presented today for surgery, with the diagnosis of NSTEMI.  The various methods of treatment have been discussed with the patient and family. After consideration of risks, benefits and other options for treatment, the patient has consented to  Procedure(s): LEFT HEART CATH AND CORONARY ANGIOGRAPHY (N/A) as a surgical intervention.  The patient's history has been reviewed, patient examined, no change in status, stable for surgery.  I have reviewed the patient's chart and labs.  Questions were answered to the patient's satisfaction.    Cath Lab Visit (complete for each Cath Lab visit)  Clinical Evaluation Leading to the Procedure:   ACS: Yes.    Non-ACS:  N/A  [name]

## 2022-11-17 ENCOUNTER — Encounter (HOSPITAL_COMMUNITY): Payer: Self-pay | Admitting: Internal Medicine

## 2022-11-17 DIAGNOSIS — I2510 Atherosclerotic heart disease of native coronary artery without angina pectoris: Secondary | ICD-10-CM | POA: Diagnosis not present

## 2022-11-17 DIAGNOSIS — I10 Essential (primary) hypertension: Secondary | ICD-10-CM | POA: Diagnosis not present

## 2022-11-17 DIAGNOSIS — R079 Chest pain, unspecified: Secondary | ICD-10-CM | POA: Diagnosis not present

## 2022-11-17 DIAGNOSIS — I214 Non-ST elevation (NSTEMI) myocardial infarction: Secondary | ICD-10-CM | POA: Diagnosis not present

## 2022-11-17 LAB — BASIC METABOLIC PANEL
Anion gap: 9 (ref 5–15)
BUN: 9 mg/dL (ref 8–23)
CO2: 24 mmol/L (ref 22–32)
Calcium: 9 mg/dL (ref 8.9–10.3)
Chloride: 102 mmol/L (ref 98–111)
Creatinine, Ser: 1.18 mg/dL (ref 0.61–1.24)
GFR, Estimated: >60 mL/min (ref >60)
Glucose, Bld: 108 mg/dL — ABNORMAL HIGH (ref 70–99)
Potassium: 3.9 mmol/L (ref 3.5–5.1)
Sodium: 135 mmol/L (ref 135–145)

## 2022-11-17 LAB — CBC
HCT: 32.5 % — ABNORMAL LOW (ref 39.0–52.0)
Hemoglobin: 11.3 g/dL — ABNORMAL LOW (ref 13.0–17.0)
MCH: 29 pg (ref 26.0–34.0)
MCHC: 34.8 g/dL (ref 30.0–36.0)
MCV: 83.5 fL (ref 80.0–100.0)
Platelets: 193 10*3/uL (ref 150–400)
RBC: 3.89 MIL/uL — ABNORMAL LOW (ref 4.22–5.81)
RDW: 16.7 % — ABNORMAL HIGH (ref 11.5–15.5)
WBC: 5.5 10*3/uL (ref 4.0–10.5)
nRBC: 0 % (ref 0.0–0.2)

## 2022-11-17 LAB — TROPONIN I (HIGH SENSITIVITY): Troponin I (High Sensitivity): 9050 ng/L (ref <18)

## 2022-11-17 MED ORDER — ISOSORBIDE MONONITRATE ER 60 MG PO TB24
60.0000 mg | ORAL_TABLET | Freq: Every day | ORAL | Status: DC
  Administered 2022-11-17 – 2022-11-18 (×2): 60 mg via ORAL
  Filled 2022-11-17 (×2): qty 1

## 2022-11-17 MED ORDER — AMLODIPINE BESYLATE 10 MG PO TABS
10.0000 mg | ORAL_TABLET | Freq: Every day | ORAL | Status: DC
  Administered 2022-11-18: 10 mg via ORAL
  Filled 2022-11-17: qty 1

## 2022-11-17 MED ORDER — NITROGLYCERIN IN D5W 200-5 MCG/ML-% IV SOLN
0.0000 ug/min | INTRAVENOUS | Status: DC
  Administered 2022-11-17: 25 ug/min via INTRAVENOUS

## 2022-11-17 NOTE — Progress Notes (Signed)
   Cardiologist:  Mealor  Subjective:   No chest pain ready for d/c   Objective:  Vitals:   11/17/22 0110 11/17/22 0314 11/17/22 0400 11/17/22 0417  BP: 127/85 118/86  115/79  Pulse: 63 66  75  Resp: 17 18  18   Temp: 97.9 F (36.6 C)   97.8 F (36.6 C)  TempSrc: Oral   Oral  SpO2:  99% 98% 99%  Weight:      Height:        Intake/Output from previous day:  Intake/Output Summary (Last 24 hours) at 11/17/2022 1610 Last data filed at 11/17/2022 9604 Gross per 24 hour  Intake 362.37 ml  Output 2150 ml  Net -1787.63 ml    Physical Exam: Affect appropriate Healthy:  appears stated age HEENT: normal Neck supple with no adenopathy JVP normal no bruits no thyromegaly Lungs clear with no wheezing and good diaphragmatic motion Heart:  S1/S2 no murmur, no rub, gallop or click PMI normal Abdomen: benighn, BS positve, no tenderness, no AAA no bruit.  No HSM or HJR Distal pulses intact with no bruits No edema Neuro non-focal Skin warm and dry No muscular weakness Right radial A no hematoma    Lab Results: Basic Metabolic Panel: Recent Labs    11/15/22 1836 11/16/22 0448 11/17/22 0449  NA  --  137 135  K  --  3.4* 3.9  CL  --  105 102  CO2  --  24 24  GLUCOSE  --  109* 108*  BUN  --  7* 9  CREATININE  --  0.87 1.18  CALCIUM  --  9.2 9.0  MG 1.9  --   --    Liver Function Tests: No results for input(s): "AST", "ALT", "ALKPHOS", "BILITOT", "PROT", "ALBUMIN" in the last 72 hours. No results for input(s): "LIPASE", "AMYLASE" in the last 72 hours. CBC: Recent Labs    11/16/22 0448 11/17/22 0449  WBC 5.6 5.5  HGB 12.6* 11.3*  HCT 35.8* 32.5*  MCV 83.6 83.5  PLT 195 193   Fasting Lipid Panel: Recent Labs    11/15/22 1020  CHOL 163  HDL 58  LDLCALC 95  TRIG 49  CHOLHDL 2.8     Imaging: CXR   Cardiac Studies:  ECG: SR recent IMI    Telemetry:  NSR 11/17/2022   Echo: 04/08/22 EF 65-70%    Assessment/Plan:   Chest Pain: ? SEMI Troponin peak  over 24,000 with moderate D1/Om dx and stent to PLB post cath pain now gone D/C iv nitroglycerin continue bidil, lopressor DAT and statin  CVA:  DAT no bleed post thrombectomy /stenting fo right  M1/MCA full resolution of neurologic deficits PPM:  normal function f/u Mealor Discussed drug use and cocaine Cautioned him about recurrent strokes/MI"s if he is to use again    [facility] to d/c home will arrange f/u with [facility]   Charlton Haws 11/17/2022, 8:37 AM

## 2022-11-17 NOTE — Progress Notes (Signed)
CARDIAC REHAB PHASE I    Pt ambulating independently to bathroom, tolerating well with no SOB, dizziness or CP. Declines walk in hall due to stomach upset. Post MI/stent education including site care, restrictions, MI booklet, antiplatelet therapy importance, exercise guidelines, heart healthy diet, NTG use, smoking and substance use cessation and CRP2 reviewed. All questions and concerns addressed. Referral sent to [facility] for CRP2. Will continue to follow.   [phone] Woodroe Chen, RN BSN 11/17/2022 9:20 AM

## 2022-11-17 NOTE — Progress Notes (Signed)
Subjective Patient denies chest pain but is nauseated since last night and has vomited once today. States feeling is similar to when he originally came to ED.  Physical exam Blood pressure 109/69, pulse 61, temperature 97.6 F (36.4 C), temperature source Oral, resp. rate 16, height 6' 1.25" (1.861 m), weight 66.4 kg, SpO2 96%.  Constitutional: well-appearing, lying in bed, in no acute distress Cardiovascular: regular rate and rhythm, no m/r/g Pulmonary/Chest: normal work of breathing on room air, lungs clear to auscultation bilaterally Skin: warm and dry Psych: normal mood and behavior  Weight change:    Intake/Output Summary (Last 24 hours) at 11/17/2022 1513 Last data filed at 11/17/2022 1445 Gross per 24 hour  Intake 665.01 ml  Output 1400 ml  Net -734.99 ml   Net IO Since Admission: -1,897.24 mL [11/17/22 1513]  Labs, images, and other studies    Latest Ref Rng & Units 11/17/2022    4:49 AM 11/16/2022    4:48 AM 11/15/2022    8:12 AM  BMP  Glucose 70 - 99 mg/dL 098  119  147   BUN 8 - 23 mg/dL 9  7  8    Creatinine 0.61 - 1.24 mg/dL 8.29  5.62  1.30   Sodium 135 - 145 mmol/L 135  137  141   Potassium 3.5 - 5.1 mmol/L 3.9  3.4  3.1   Chloride 98 - 111 mmol/L 102  105  102   CO2 22 - 32 mmol/L 24  24  26    Calcium 8.9 - 10.3 mg/dL 9.0  9.2  9.5        Latest Ref Rng & Units 11/17/2022    4:49 AM 11/16/2022    4:48 AM 11/15/2022    8:12 AM  CBC  WBC 4.0 - 10.5 K/uL 5.5  5.6  5.4   Hemoglobin 13.0 - 17.0 g/dL 86.5  78.4  69.6   Hematocrit 39.0 - 52.0 % 32.5  35.8  39.3   Platelets 150 - 400 K/uL 193  195  216     Assessment and plan Hospital day 2  [name] is a 72 y.o.male admitted 9/8 for NSTEMI s/p PCI this admission to [facility] and residual moderate OM/D1     Principal Problem:   Chest pain with high risk for cardiac etiology Active Problems:   Essential hypertension   COPD (chronic obstructive pulmonary disease) (HCC)   Sinoatrial node  dysfunction (HCC)   History of CVA (cerebrovascular accident)   Paroxysmal atrial fibrillation (HCC)   Cocaine use disorder (HCC)  #NSTEMI #CAD, s/p PCI this admission to PLB and residual moderate OM/D1  #Hx SSS s/p PMI  #Chronic crack cocaine abuse Yesterday, patient underwent successful PCI. Patient developed chest pain yesterday afternoon so Cardiology started Nitroglycerin drip in additional to BiDil TID. Today patient is without chest pain and troponin has decreased to 9K (> 24,000 9/8). Subsequent ECG's s/p stent have shown mild ST elevation in inferior leads. Cardiology consulted regarding these and explained that these changes are consistent with normal findings associated with his hospital course to date. Patient has history of medication non-compliance so BiDil TID switched to Imdur 60 mg/daily. -Continue ASA, Lipitor, Lopressor, Brilinta in addition to above  -Will attempt to down-titrate Nitroglycerin drip today as tolerated.  #HTN BP elevated yesterday despite Nitroglycerin drip.  -Amlodipine increased to 10 -Continue Lopressor 12.5 BID -Hydralazine prn -Trend vitals   #  HLD -Continue home Lipitor 80 and Zetia 10 -LDL on admission is 95; above goal for secondary prevention   #COPD #Tobacco use Unremarkable physical exam. Emphysema noted on Chest X-ray. -Continue home Incruse Elipta and Albuterol nebulizer prn -Low-dose lung CT 07/2020 notes Lung-RAD 2, benign appearance and recommended follow-up scan in 12 months -Outpatient counseling   #Cocaine Use Disorder -Advised cessation especially in the setting of his NSTEMI and CAD -Outpatient counseling   #Hypokalemia Resolved   Diet: Heart Healthy VTE: enoxaparin (LOVENOX) injection 40 mg Start: 11/17/22 0800  Code: DNR-Limited  Carmina Miller, DO 11/17/2022, 3:13 PM  Pager: [phone] After 5pm or weekend: [phone]

## 2022-11-17 NOTE — Progress Notes (Signed)
Primary service reached out to review EKG, subtle residual STE inferior leads noted similar to earlier this AM. Pt remains chest pain free and troponin was downtrending today. This is likely expected evolution of recent MI. D/w Dr. Eden Emms who agrees.

## 2022-11-18 ENCOUNTER — Other Ambulatory Visit (HOSPITAL_COMMUNITY): Payer: Self-pay

## 2022-11-18 ENCOUNTER — Telehealth: Payer: Self-pay | Admitting: Cardiovascular Disease

## 2022-11-18 DIAGNOSIS — I214 Non-ST elevation (NSTEMI) myocardial infarction: Secondary | ICD-10-CM | POA: Diagnosis not present

## 2022-11-18 DIAGNOSIS — R079 Chest pain, unspecified: Secondary | ICD-10-CM | POA: Diagnosis not present

## 2022-11-18 DIAGNOSIS — I10 Essential (primary) hypertension: Secondary | ICD-10-CM | POA: Diagnosis not present

## 2022-11-18 DIAGNOSIS — I2510 Atherosclerotic heart disease of native coronary artery without angina pectoris: Secondary | ICD-10-CM | POA: Diagnosis not present

## 2022-11-18 LAB — BASIC METABOLIC PANEL
Anion gap: 9 (ref 5–15)
BUN: 13 mg/dL (ref 8–23)
CO2: 27 mmol/L (ref 22–32)
Calcium: 9 mg/dL (ref 8.9–10.3)
Chloride: 100 mmol/L (ref 98–111)
Creatinine, Ser: 1.25 mg/dL — ABNORMAL HIGH (ref 0.61–1.24)
GFR, Estimated: >60 mL/min (ref >60)
Glucose, Bld: 102 mg/dL — ABNORMAL HIGH (ref 70–99)
Potassium: 4 mmol/L (ref 3.5–5.1)
Sodium: 136 mmol/L (ref 135–145)

## 2022-11-18 LAB — LIPOPROTEIN A (LPA): Lipoprotein (a): 95.3 nmol/L — ABNORMAL HIGH (ref <75.0)

## 2022-11-18 MED ORDER — AMLODIPINE BESYLATE 10 MG PO TABS
10.0000 mg | ORAL_TABLET | Freq: Every day | ORAL | 0 refills | Status: DC
Start: 2022-11-18 — End: 2022-11-19
  Filled 2022-11-18: qty 30, 30d supply, fill #0

## 2022-11-18 MED ORDER — METOPROLOL SUCCINATE ER 25 MG PO TB24
25.0000 mg | ORAL_TABLET | Freq: Every day | ORAL | 0 refills | Status: DC
  Filled 2022-11-18: qty 30, 30d supply, fill #0

## 2022-11-18 MED ORDER — ISOSORBIDE MONONITRATE ER 60 MG PO TB24
60.0000 mg | ORAL_TABLET | Freq: Every day | ORAL | 0 refills | Status: DC
  Filled 2022-11-18: qty 30, 30d supply, fill #0

## 2022-11-18 NOTE — Progress Notes (Signed)
CARDIAC REHAB PHASE I   Pt resting in bed, dressed and ready for discharge home. Pt ambulating independently with no SOB, dizziness or CP. All educational needs met. Referral to [facility] for CRP2.  [phone] Woodroe Chen, RN BSN 11/18/2022 12:05 PM

## 2022-11-18 NOTE — Care Management Important Message (Signed)
Important Message  Patient Details  Name: [name] MRN: [id number] Date of Birth: April 02, 1950   Medicare Important Message Given:  Yes     Sherilyn Banker 11/18/2022, 2:55 PM

## 2022-11-18 NOTE — Discharge Summary (Addendum)
Name: [name] MRN: [id number] DOB: Oct 29, 1950 72 y.o. PCP: Gust Rung, DO  Date of Admission: 11/15/2022  8:03 AM Date of Discharge: 11/18/2022  Attending Physician: Dr. Antony Contras  DISCHARGE DIAGNOSIS:  Primary Problem: Chest pain with high risk for cardiac etiology   Hospital Problems: Principal Problem:   Chest pain with high risk for cardiac etiology Active Problems:   Essential hypertension   COPD (chronic obstructive pulmonary disease) (HCC)   Sinoatrial node dysfunction (HCC)   History of CVA (cerebrovascular accident)   Paroxysmal atrial fibrillation (HCC)   Cocaine use disorder (HCC)    DISCHARGE MEDICATIONS:   Allergies as of 11/18/2022       Reactions   Ace Inhibitors Swelling, Other (See Comments)   Patient presented with right upper lip swelling along with tingling and numbness. Possible allergic reaction to ACEI.        Medication List     STOP taking these medications    spironolactone 25 MG tablet Commonly known as: ALDACTONE       TAKE these medications    amLODipine 10 MG tablet Commonly known as: NORVASC Take 1 tablet (10 mg total) by mouth daily. What changed:  medication strength how much to take   aspirin EC 81 MG tablet Take 1 tablet (81 mg total) by mouth daily. Swallow whole.   atorvastatin 80 MG tablet Commonly known as: LIPITOR Take 1 tablet (80 mg total) by mouth daily.   Brilinta 90 MG Tabs tablet Generic drug: ticagrelor Take 1 tablet (90 mg total) by mouth 2 (two) times daily.   ezetimibe 10 MG tablet Commonly known as: ZETIA Take 1 tablet (10 mg total) by mouth daily.   fluticasone 50 MCG/ACT nasal spray Commonly known as: FLONASE Place 1 spray into both nostrils daily.   Incruse Ellipta 62.5 MCG/ACT Aepb Generic drug: umeclidinium bromide Inhale 1 puff into the lungs daily.   isosorbide mononitrate 60 MG 24 hr tablet Commonly known as: IMDUR Take 1 tablet (60 mg total) by mouth daily. Start  taking on: November 19, 2022   metoprolol succinate 25 MG 24 hr tablet Commonly known as: TOPROL-XL Take 1 tablet (25 mg total) by mouth daily. Take with or immediately following a meal. What changed:  medication strength how much to take   nitroGLYCERIN 0.4 MG SL tablet Commonly known as: NITROSTAT Place 1 tablet (0.4 mg total) under the tongue every 5 (five) minutes as needed for chest pain.   Ventolin HFA 108 (90 Base) MCG/ACT inhaler Generic drug: albuterol Inhale 2 puffs into the lungs as needed for wheezing or shortness of breath. What changed: See the new instructions.        DISPOSITION AND FOLLOW-UP:  [name] was discharged from [facility] in Good condition. At the hospital follow up visit please address:  Follow-up Recommendations:  #NSTEMI #CAD, s/p PCI this admission to PLB and residual moderate OM/D1  #Hx SSS s/p PMI  -Ensure no more chest pain since discharge -Ensure compliance/understanding of medication regimen   #HLD -LDL on admission is 95; above goal for secondary prevention -Consider additional agent   #Tobacco use #Cocaine use -Consider referral to [facility] -Consider Chantix or other crave-reducing measures   #Hypokalemia -Repeat BMP HOSPITAL COURSE:  Patient Summary:  [name] is a 72 year old male with PMH of ischemic stroke s/p thrombectomy/stent placement (05/2022), symptomatic bradycardia s/p Medtronic dual-chamber pacemaker, HTN, HLD, COPD, tobacco use (50+ pack-year history), and crack-cocaine abuse who presented to  the ED 9/8 with left-sided chest pain. Subsequent work-up revealed an NSTEMI and patient was taken for PCI the following day with stent to PLB that was 95% stenosed. Heart cath. also noted residual moderate OM/D1 to be managed medically. Patient was cleared for discharge but that afternoon he experienced chest pain so patient was held and put on a nitroglycerin drip. Nitroglycerin drip was  successfully down-titrated and patient did not endorse any chest pain. Subsequent ECG's s/p stent showed minor ST elevation in inferior leads  - discussed these new findings with cardiology who they felt were natural progression of his infarct. Patient comfortable and without complaints day of discharge.     DISCHARGE INSTRUCTIONS:   Discharge Instructions     Amb Referral to Cardiac Rehabilitation   Complete by: As directed    Diagnosis:  NSTEMI Coronary Stents     After initial evaluation and assessments completed: Virtual Based Care may be provided alone or in conjunction with Phase 2 Cardiac Rehab based on patient barriers.: Yes   Intensive Cardiac Rehabilitation (ICR) [facility] location only OR Traditional Cardiac Rehabilitation (TCR) *If criteria for ICR are not met will enroll in TCR [facility] only): Yes   Diet - low sodium heart healthy   Complete by: As directed    Discharge instructions   Complete by: As directed    You were treated for an NSTEMI (heart attack) caused by a blockage in one of the arteries in your heart. This was treated by putting a stent to keep that artery open. Please take medications as prescribed and follow-up with both cardiology and the [facility] appointment we have made for you with Dr. Mikey Bussing on 9/12 @ 9:45.  Avoid medications like NSAIDs (ibuprofen, Advil, naproxen, Aleve, etc.) as these can increase your risk for heart attack. The best thing you can do for your health is quit smoking today.  If you experience similar chest pain please come back to the ED.   Discharge instructions   Complete by: As directed    You were treated for a heart attack that resulted from one of the arteries in your heart being blocked. A stent was placed to fix this. Please take all medications as prescribed. The most important thing you can do is to adhere to your medications and to stop smoking.  Please follow-up with both your Cardiology appointment (9/26), and the appointment we have made  for you in the [facility] tomorrow 9/12 @ 9:45 with Dr. Mikey Bussing.  If you experience chest pain please return to the ED.   Electrocardiogram report   Complete by: As directed    Increase activity slowly   Complete by: As directed    Increase activity slowly   Complete by: As directed        SUBJECTIVE:  Patient denies chest pain, palpitations, SOB and is ready for discharge. Discharge Vitals:   BP 114/80 (BP Location: Left Arm)   Pulse 61   Temp 98.2 F (36.8 C) (Oral)   Resp 18   Ht 6' 1.25" (1.861 m)   Wt 66.4 kg   SpO2 95%   BMI 19.18 kg/m   OBJECTIVE:  P hysical Exam: Constitutional: well-appearing, lying in bed, in no acute distress Cardiovascular: regular rate and rhythm, no m/r/g Pulmonary/Chest: normal work of breathing on room air, lungs clear to auscultation bilaterally Skin: warm and dry Psych: normal mood and behavior   Pertinent Labs, Studies, and Procedures:     Latest Ref Rng & Units 11/17/2022  4:49 AM 11/16/2022    4:48 AM 11/15/2022    8:12 AM  CBC  WBC 4.0 - 10.5 K/uL 5.5  5.6  5.4   Hemoglobin 13.0 - 17.0 g/dL 24.4  01.0  27.2   Hematocrit 39.0 - 52.0 % 32.5  35.8  39.3   Platelets 150 - 400 K/uL 193  195  216        Latest Ref Rng & Units 11/18/2022    3:24 AM 11/17/2022    4:49 AM 11/16/2022    4:48 AM  CMP  Glucose 70 - 99 mg/dL 536  644  034   BUN 8 - 23 mg/dL 13  9  7    Creatinine 0.61 - 1.24 mg/dL 7.42  5.95  6.38   Sodium 135 - 145 mmol/L 136  135  137   Potassium 3.5 - 5.1 mmol/L 4.0  3.9  3.4   Chloride 98 - 111 mmol/L 100  102  105   CO2 22 - 32 mmol/L 27  24  24    Calcium 8.9 - 10.3 mg/dL 9.0  9.0  9.2     ECHOCARDIOGRAM COMPLETE  Result Date: 11/16/2022    ECHOCARDIOGRAM REPORT   Patient Name:   [name] Date of Exam: 11/16/2022 Medical Rec #:  [id number]         Height:       73.3 in Accession #:    [id number]        Weight:       146.4 lb Date of Birth:  1950-06-22         BSA:          1.890 m Patient Age:    71 years           BP:           181/120 mmHg Patient Gender: M                 HR:           62 bpm. Exam Location:  Inpatient Procedure: 2D Echo, Cardiac Doppler and Color Doppler Indications:    NSTEMI I21.4  History:        Patient has prior history of Echocardiogram examinations, most                 recent 04/08/2022. Pacemaker, COPD, Stroke and TIA,                 Arrythmias:Atrial Fibrillation and Tachycardia; Risk                 Factors:Hypertension, Dyslipidemia and Current Smoker. Migraine.  Sonographer:    Lucendia Herrlich Referring Phys: [phone] CHRISTOPHER END IMPRESSIONS  1. Left ventricular ejection fraction, by estimation, is 55 to 60%. The left ventricle has normal function. The left ventricle has no regional wall motion abnormalities. There is mild left ventricular hypertrophy. Left ventricular diastolic parameters are consistent with Grade I diastolic dysfunction (impaired relaxation).  2. Right ventricular systolic function is normal. The right ventricular size is normal.  3. The mitral valve is normal in structure. No evidence of mitral valve regurgitation.  4. The aortic valve is tricuspid. Aortic valve regurgitation is not visualized.  5. The inferior vena cava is normal in size with greater than 50% respiratory variability, suggesting right atrial pressure of 3 mmHg. FINDINGS  Left Ventricle: Left ventricular ejection fraction, by estimation, is 55 to 60%. The left ventricle has normal function. The left ventricle has no regional  wall motion abnormalities. The left ventricular internal cavity size was normal in size. There is  mild left ventricular hypertrophy. Abnormal (paradoxical) septal motion, consistent with RV pacemaker. Left ventricular diastolic parameters are consistent with Grade I diastolic dysfunction (impaired relaxation). Right Ventricle: The right ventricular size is normal. Right ventricular systolic function is normal. Left Atrium: Left atrial size was normal in size. Right Atrium: Right  atrial size was normal in size. Pericardium: There is no evidence of pericardial effusion. Mitral Valve: The mitral valve is normal in structure. No evidence of mitral valve regurgitation. Tricuspid Valve: Tricuspid valve regurgitation is mild. Aortic Valve: The aortic valve is tricuspid. Aortic valve regurgitation is not visualized. Aortic valve peak gradient measures 2.9 mmHg. Pulmonic Valve: Pulmonic valve regurgitation is not visualized. Aorta: The aortic root and ascending aorta are structurally normal, with no evidence of dilitation. Venous: The inferior vena cava is normal in size with greater than 50% respiratory variability, suggesting right atrial pressure of 3 mmHg. IAS/Shunts: No atrial level shunt detected by color flow Doppler. Additional Comments: A device lead is visualized.  LEFT VENTRICLE PLAX 2D LVIDd:         3.60 cm   Diastology LVIDs:         2.50 cm   LV e' medial:    3.57 cm/s LV PW:         1.10 cm   LV E/e' medial:  13.0 LV IVS:        1.10 cm   LV e' lateral:   5.11 cm/s LVOT diam:     2.20 cm   LV E/e' lateral: 9.1 LV SV:         50 LV SV Index:   26 LVOT Area:     3.80 cm  RIGHT VENTRICLE             IVC RV S prime:     12.20 cm/s  IVC diam: 1.50 cm TAPSE (M-mode): 2.2 cm LEFT ATRIUM             Index        RIGHT ATRIUM           Index LA diam:        2.30 cm 1.22 cm/m   RA Area:     15.15 cm LA Vol (A2C):   30.1 ml 15.93 ml/m  RA Volume:   41.65 ml  22.04 ml/m LA Vol (A4C):   24.0 ml 12.70 ml/m LA Biplane Vol: 26.8 ml 14.18 ml/m  AORTIC VALVE AV Area (Vmax): 2.74 cm AV Vmax:        84.80 cm/s AV Peak Grad:   2.9 mmHg LVOT Vmax:      61.08 cm/s LVOT Vmean:     39.100 cm/s LVOT VTI:       0.130 m  AORTA Ao Root diam: 3.90 cm MITRAL VALVE               TRICUSPID VALVE MV Area (PHT): 3.65 cm    TR Peak grad:   20.2 mmHg MV Decel Time: 208 msec    TR Vmax:        225.00 cm/s MR Peak grad: 19.9 mmHg MR Vmax:      223.00 cm/s  SHUNTS MV E velocity: 46.40 cm/s  Systemic VTI:  0.13 m  MV A velocity: 66.50 cm/s  Systemic Diam: 2.20 cm MV E/A ratio:  0.70 Photographer signed by Carolan Clines Signature Date/Time: 11/16/2022/3:38:57 PM  Final    CARDIAC CATHETERIZATION  Result Date: 11/16/2022 Conclusions: Moderate to severe multivessel coronary artery disease, as detailed below, predominantly affecting distal/branch vessels.  Culprit lesion for the patient's NSTEMI appears to be a 95% stenosis involving moderate-caliber rPL1 branch. Mid inferior hypokinesis with otherwise preserved left ventricular systolic function (LVEF 55-65%). Normal left ventricular filling pressure (LVEDP 7 mmHg). Challenging but successful PCI to rPL1 using Onyx Frontier 2.25 x 22 mm drug-eluting stent with 0% residual stenosis and TIMI-3 flow. Recommendations: Dual antiplatelet therapy with aspirin and ticagrelor for at least 12 months. Aggressive secondary prevention of coronary artery disease. Obtain echocardiogram. Favor medical therapy of diagonal/distal LCx disease. Yvonne Kendall, MD [facility]  DG Chest 2 View  Result Date: 11/15/2022 CLINICAL DATA:  72 year old male with history of chest pain. EXAM: CHEST - 2 VIEW COMPARISON:  Chest x-ray 08/01/2020. FINDINGS: Lung volumes are increased with advanced emphysematous changes. No consolidative airspace disease. No pleural effusions. No pneumothorax. No pulmonary nodule or mass noted. Pulmonary vasculature and the cardiomediastinal silhouette are within normal limits. Atherosclerosis in the thoracic aorta. Left-sided pacemaker device in place with lead tips projecting over the expected location of the right atrium and right ventricle. IMPRESSION: 1. No radiographic evidence of acute cardiopulmonary disease. 2. Emphysema. 3. Aortic atherosclerosis. Electronically Signed   By: Trudie Reed M.D.   On: 11/15/2022 09:07     Signed: Carmina Miller, DO  Internal Medicine Resident, PGY-1 [facility]  Pager:  [phone] 1:05 PM, 11/18/2022

## 2022-11-18 NOTE — Progress Notes (Signed)
Pt being d/c, VSS, IV removed, education complete, meds @ TOC.   Balinda Quails, RN 11/18/2022 12:12 PM

## 2022-11-18 NOTE — Telephone Encounter (Signed)
Correction:  TOC scheduled for 09/26 at 11:45am with Dr. Nelly Laurence per Dr. Eden Emms

## 2022-11-18 NOTE — Telephone Encounter (Signed)
Patient contacted regarding discharge from [facility] on 11/17/2022.  Patient understands to follow up with provider Dr. Augusto Gamble on 12/03/22 at 11:45 am at [facility]. Patient understands discharge instructions? yes Patient understands medications and regiment? yes Patient understands to bring all medications to this visit? yes  Ask patient:  Are you enrolled in My Chart yes

## 2022-11-18 NOTE — Progress Notes (Signed)
   Cardiologist:  Mealor  Subjective:   No chest pain ready for d/c   Objective:  Vitals:   11/18/22 0400 11/18/22 0453 11/18/22 0747 11/18/22 0807  BP:  (!) 120/94    Pulse:  71 67   Resp:  16 16 15   Temp:  98 F (36.7 C)  98.1 F (36.7 C)  TempSrc:  Oral  Oral  SpO2: 98%  98%   Weight:      Height:        Intake/Output from previous day:  Intake/Output Summary (Last 24 hours) at 11/18/2022 0957 Last data filed at 11/18/2022 0800 Gross per 24 hour  Intake 812.89 ml  Output 700 ml  Net 112.89 ml    Physical Exam: Affect appropriate Healthy:  appears stated age HEENT: normal Neck supple with no adenopathy JVP normal no bruits no thyromegaly Lungs clear with no wheezing and good diaphragmatic motion Heart:  S1/S2 no murmur, no rub, gallop or click PMI normal Abdomen: benighn, BS positve, no tenderness, no AAA no bruit.  No HSM or HJR Distal pulses intact with no bruits No edema Neuro non-focal Skin warm and dry No muscular weakness Right radial A no hematoma    Lab Results: Basic Metabolic Panel: Recent Labs    11/15/22 1836 11/16/22 0448 11/17/22 0449 11/18/22 0324  NA  --    < > 135 136  K  --    < > 3.9 4.0  CL  --    < > 102 100  CO2  --    < > 24 27  GLUCOSE  --    < > 108* 102*  BUN  --    < > 9 13  CREATININE  --    < > 1.18 1.25*  CALCIUM  --    < > 9.0 9.0  MG 1.9  --   --   --    < > = values in this interval not displayed.    CBC: Recent Labs    11/16/22 0448 11/17/22 0449  WBC 5.6 5.5  HGB 12.6* 11.3*  HCT 35.8* 32.5*  MCV 83.6 83.5  PLT 195 193   Fasting Lipid Panel: Recent Labs    11/15/22 1020  CHOL 163  HDL 58  LDLCALC 95  TRIG 49  CHOLHDL 2.8     Imaging: CXR   Cardiac Studies:  ECG: SR recent IMI    Telemetry:  NSR 11/18/2022   Echo: 04/08/22 EF 65-70%    Assessment/Plan:   Chest Pain: ? SEMI Troponin peak over 24,000 with moderate D1/Om dx and stent to PLB post cath pain now gone Off iv  nitroglycerin On imdur, lopressor ASA and Brilinta ECG with minimal ST elevation post stent. Troponin down 24,000 to 9,050 no chest pain  CVA:  DAT no bleed post thrombectomy /stenting fo right  M1/MCA full resolution of neurologic deficits PPM:  normal function f/u Mealor Discussed drug use and cocaine Cautioned him about recurrent strokes/MI"s if he is to use again    [facility] to d/c home will arrange f/u with [facility]   Charlton Haws 11/18/2022, 9:57 AM

## 2022-11-18 NOTE — Telephone Encounter (Signed)
TOC scheduled for 09/26 at 11:45am with Dr. Eden Emms

## 2022-11-19 ENCOUNTER — Other Ambulatory Visit (HOSPITAL_COMMUNITY): Payer: Self-pay

## 2022-11-19 ENCOUNTER — Telehealth: Payer: Self-pay | Admitting: Cardiovascular Disease

## 2022-11-19 ENCOUNTER — Ambulatory Visit (INDEPENDENT_AMBULATORY_CARE_PROVIDER_SITE_OTHER): Payer: 59 | Admitting: Internal Medicine

## 2022-11-19 ENCOUNTER — Encounter: Payer: Self-pay | Admitting: Internal Medicine

## 2022-11-19 ENCOUNTER — Other Ambulatory Visit: Payer: Self-pay

## 2022-11-19 VITALS — BP 101/76 | HR 79 | Temp 98.1°F | Ht 73.0 in | Wt 147.5 lb

## 2022-11-19 DIAGNOSIS — Z23 Encounter for immunization: Secondary | ICD-10-CM | POA: Diagnosis not present

## 2022-11-19 DIAGNOSIS — Z8673 Personal history of transient ischemic attack (TIA), and cerebral infarction without residual deficits: Secondary | ICD-10-CM

## 2022-11-19 DIAGNOSIS — I10 Essential (primary) hypertension: Secondary | ICD-10-CM

## 2022-11-19 DIAGNOSIS — J4489 Other specified chronic obstructive pulmonary disease: Secondary | ICD-10-CM

## 2022-11-19 DIAGNOSIS — J449 Chronic obstructive pulmonary disease, unspecified: Secondary | ICD-10-CM

## 2022-11-19 DIAGNOSIS — I2510 Atherosclerotic heart disease of native coronary artery without angina pectoris: Secondary | ICD-10-CM | POA: Diagnosis not present

## 2022-11-19 DIAGNOSIS — J439 Emphysema, unspecified: Secondary | ICD-10-CM

## 2022-11-19 MED ORDER — ALBUTEROL SULFATE HFA 108 (90 BASE) MCG/ACT IN AERS: 2.0000 | INHALATION_SPRAY | RESPIRATORY_TRACT | 2 refills | Status: AC | PRN

## 2022-11-19 MED ORDER — ATORVASTATIN CALCIUM 80 MG PO TABS: 80.0000 mg | ORAL_TABLET | Freq: Every day | ORAL | 3 refills | Status: DC

## 2022-11-19 MED ORDER — ISOSORBIDE MONONITRATE ER 60 MG PO TB24: 60.0000 mg | ORAL_TABLET | Freq: Every day | ORAL | 3 refills | Status: DC

## 2022-11-19 MED ORDER — FLUTICASONE PROPIONATE 50 MCG/ACT NA SUSP: 1.0000 | Freq: Every day | NASAL | 3 refills | Status: DC

## 2022-11-19 MED ORDER — AEROCHAMBER PLUS FLO-VU LARGE MISC: 1.0000 | Freq: Once | 0 refills | Status: AC

## 2022-11-19 MED ORDER — AMLODIPINE BESYLATE 10 MG PO TABS: 10.0000 mg | ORAL_TABLET | Freq: Every day | ORAL | 3 refills | Status: DC

## 2022-11-19 MED ORDER — INCRUSE ELLIPTA 62.5 MCG/ACT IN AEPB: 1.0000 | INHALATION_SPRAY | Freq: Every day | RESPIRATORY_TRACT | 3 refills | Status: DC

## 2022-11-19 MED ORDER — METOPROLOL SUCCINATE ER 25 MG PO TB24: 25.0000 mg | ORAL_TABLET | Freq: Every day | ORAL | 3 refills | Status: DC

## 2022-11-19 MED ORDER — TICAGRELOR 90 MG PO TABS: 90.0000 mg | ORAL_TABLET | Freq: Two times a day (BID) | ORAL | 3 refills | Status: DC

## 2022-11-19 MED ORDER — EZETIMIBE 10 MG PO TABS: 10.0000 mg | ORAL_TABLET | Freq: Every day | ORAL | 3 refills | Status: DC

## 2022-11-19 NOTE — Progress Notes (Signed)
Established Patient Office Visit  Subjective   Patient ID: [name], male    DOB: 1950/09/18  Age: 72 y.o. MRN: [id number]  Chief Complaint  Patient presents with   Hospitalization Follow-up   Jayant presents today with his daughter.  Quite a bit has happened since our last visit.  He did follow-up with Dr. Allena Katz and was reevaluated for his cerebral stent this actually had excellent blood flow and thus it was deemed that he may discontinue dual antiplatelet in favor of aspirin alone he had yet to stop DAPT when he developed chest pressure shortness of breath and nausea prompting ED evaluation where he was found to have an NSTEMI.  A drug-eluting stent was placed and he was urged to increase compliance with his atorvastatin and ezetimibe.  He is feeling a bit fatigued but chest pain-free today.  Wants to see Dr. Eden Emms in 2 weeks so that he may be cleared for increased activity.     Objective:     BP 101/76 (BP Location: Right Arm, Patient Position: Sitting, Cuff Size: Normal)   Pulse 79   Temp 98.1 F (36.7 C) (Oral)   Ht 6\' 1"  (1.854 m)   Wt 147 lb 8 oz (66.9 kg)   SpO2 100%   BMI 19.46 kg/m  BP Readings from Last 3 Encounters:  11/19/22 101/76  11/18/22 114/80  11/12/22 (!) 179/96   Wt Readings from Last 3 Encounters:  11/19/22 147 lb 8 oz (66.9 kg)  11/15/22 146 lb 6.4 oz (66.4 kg)  11/12/22 150 lb (68 kg)      Physical Exam Vitals and nursing note reviewed.  Cardiovascular:     Rate and Rhythm: Normal rate and regular rhythm.  Pulmonary:     Effort: Pulmonary effort is normal.     Breath sounds: Normal breath sounds.  Neurological:     Mental Status: He is alert.  Psychiatric:        Mood and Affect: Mood normal.      No results found for any visits on 11/19/22.  Last CBC Lab Results  Component Value Date   WBC 5.5 11/17/2022   HGB 11.3 (L) 11/17/2022   HCT 32.5 (L) 11/17/2022   MCV 83.5 11/17/2022   MCH 29.0 11/17/2022   RDW 16.7 (H)  11/17/2022   PLT 193 11/17/2022   Last metabolic panel Lab Results  Component Value Date   GLUCOSE 102 (H) 11/18/2022   NA 136 11/18/2022   K 4.0 11/18/2022   CL 100 11/18/2022   CO2 27 11/18/2022   BUN 13 11/18/2022   CREATININE 1.25 (H) 11/18/2022   GFRNONAA >60 11/18/2022   CALCIUM 9.0 11/18/2022   PHOS 2.0 (L) 04/25/2022   PROT 7.4 04/23/2022   ALBUMIN 3.8 04/23/2022   LABGLOB 2.4 12/25/2021   AGRATIO 1.8 12/25/2021   BILITOT 0.5 04/23/2022   ALKPHOS 110 04/23/2022   AST 31 04/23/2022   ALT 28 04/23/2022   ANIONGAP 9 11/18/2022      The ASCVD Risk score (Arnett DK, et al., 2019) failed to calculate for the following reasons:   The patient has a prior MI or stroke diagnosis    Assessment & Plan:   Problem List Items Addressed This Visit       Cardiovascular and Mediastinum   Essential hypertension (Chronic)    Blood pressure normal today.  Sent 30-day supplies of medications he typically likes to do 90-day supplies have reordered these to 90 days.  Relevant Medications   amLODipine (NORVASC) 10 MG tablet   isosorbide mononitrate (IMDUR) 60 MG 24 hr tablet   metoprolol succinate (TOPROL-XL) 25 MG 24 hr tablet   atorvastatin (LIPITOR) 80 MG tablet   ezetimibe (ZETIA) 10 MG tablet   CAD (coronary artery disease), native coronary artery    Doing antiplatelet therapy x 1 year.  Needs to get LDL below 70 counseled about increase adherence to atorvastatin 80 mg and Zetia 10 mg.  Follow-up scheduled with cardiology.      Relevant Medications   amLODipine (NORVASC) 10 MG tablet   isosorbide mononitrate (IMDUR) 60 MG 24 hr tablet   metoprolol succinate (TOPROL-XL) 25 MG 24 hr tablet   atorvastatin (LIPITOR) 80 MG tablet   ezetimibe (ZETIA) 10 MG tablet     Respiratory   COPD (chronic obstructive pulmonary disease) (HCC)    Recently started on Incruse Ellipta still using albuterol quite frequently.  Discussed with him using albuterol only for rescue symptoms  and increase his daily medication.      Relevant Medications   fluticasone (FLONASE) 50 MCG/ACT nasal spray   albuterol (VENTOLIN HFA) 108 (90 Base) MCG/ACT inhaler   umeclidinium bromide (INCRUSE ELLIPTA) 62.5 MCG/ACT AEPB     Other   History of CVA (cerebrovascular accident) (Chronic)    Will need DAPT for 1 year due to heart from CVA standpoint okay for aspirin alone after that time.      Other Visit Diagnoses     Encounter for immunization    -  Primary   Relevant Orders   Flu Vaccine Trivalent High Dose (Fluad) (Completed)   COPD with asthma (HCC)  (Chronic)      Relevant Medications   fluticasone (FLONASE) 50 MCG/ACT nasal spray   albuterol (VENTOLIN HFA) 108 (90 Base) MCG/ACT inhaler   umeclidinium bromide (INCRUSE ELLIPTA) 62.5 MCG/ACT AEPB       Return in about 3 months (around 02/18/2023).    Gust Rung, DO

## 2022-11-19 NOTE — Telephone Encounter (Signed)
Patient called stating he was returning staff call.

## 2022-11-19 NOTE — Patient Instructions (Signed)
If you want to break up how many pills you are taking in the morning, you can take your cholesterol pills Atorvastatin and Ezetimibe at night.  You may also take the amlodipine at night if you would like.

## 2022-11-19 NOTE — Telephone Encounter (Signed)
I spoke with patient. He reports he missed a call from our office around 1:00 today.  There was no message. I do not see where call was placed to patient.  Patient has no questions.  Patient aware of appointment on 9/26.  Device clinic did not receive recent scheduled device transmission.  Patient will send in transmission when he returns home.

## 2022-11-20 DIAGNOSIS — I2510 Atherosclerotic heart disease of native coronary artery without angina pectoris: Secondary | ICD-10-CM | POA: Insufficient documentation

## 2022-11-20 NOTE — Assessment & Plan Note (Signed)
Doing antiplatelet therapy x 1 year.  Needs to get LDL below 70 counseled about increase adherence to atorvastatin 80 mg and Zetia 10 mg.  Follow-up scheduled with cardiology.

## 2022-11-20 NOTE — Assessment & Plan Note (Signed)
Recently started on Incruse Ellipta still using albuterol quite frequently.  Discussed with [name] using albuterol only for rescue symptoms and increase his daily medication.

## 2022-11-20 NOTE — Assessment & Plan Note (Signed)
Will need DAPT for 1 year due to heart from CVA standpoint okay for aspirin alone after that time.

## 2022-11-20 NOTE — Assessment & Plan Note (Signed)
Blood pressure normal today.  Sent 30-day supplies of medications he typically likes to do 90-day supplies have reordered these to 90 days.

## 2022-11-23 ENCOUNTER — Other Ambulatory Visit (HOSPITAL_COMMUNITY): Payer: Self-pay

## 2022-11-23 ENCOUNTER — Telehealth (HOSPITAL_COMMUNITY): Payer: Self-pay

## 2022-11-23 NOTE — Telephone Encounter (Signed)
Called patient to see if he is interested in the Cardiac Rehab Program. Patient expressed interest. Explained scheduling process and went over insurance, patient verbalized understanding. Will contact patient for scheduling once f/u has been completed.

## 2022-11-24 ENCOUNTER — Telehealth: Payer: Self-pay | Admitting: Internal Medicine

## 2022-11-24 NOTE — Telephone Encounter (Signed)
Pt calling to report the following medication is causing him to have some shortness of breath when he takes it.  Pt is requesting a call back about what to do.   ticagrelor (BRILINTA) 90 MG TABS tablet   [facility] [address] [place] - [address] (Ph: [phone])

## 2022-11-25 ENCOUNTER — Telehealth: Payer: Self-pay | Admitting: *Deleted

## 2022-11-25 NOTE — Telephone Encounter (Signed)
[name] could you relay the message: [name] can certainly cause shortness of breath but it is a very important medication to take right now after his heart attack.  I would really want to make sure it is the brillinta and not something else before changing his medications and would like an in person eval.  I see that he sees his cardiologist in a few days and I think that would be fine to keep taking it until he can get evaluated by cardiology.  If shortness of breath is severe he would need to come in here, urgent care, or ED prior to that visit.

## 2022-11-25 NOTE — Telephone Encounter (Signed)
Call from patient is having shortness of breath more.  Thinks it is due to his being on Brilinta.  Wants to know if he can cut the dose in half.  States was doing better when the dosing was less.  Patient states has not contacted Cardiology as of yet.  Was given number to Cardiology.  Has an appointment on 12/03/2022.  Wants to talk with Dr. Mikey Bussing about the medication first.

## 2022-11-25 NOTE — Telephone Encounter (Signed)
Call to patient told him as he knows that the Marden Noble is very important for him to take at this point and that you suggested  being evaluated prior to changing as the shortness of breath  could be the result of something else.  Patient agreed and will continue his Brilinta at the current dose until his Cardiology appointment on 9/26.  Patient was also advised to call for an appointment or go to Urgent Care or the ER if the shortness of breath worsens.  Agreed to do.

## 2022-11-27 NOTE — Telephone Encounter (Signed)
Attempted call no answer left VM that I tried

## 2022-12-03 ENCOUNTER — Ambulatory Visit: Payer: 59 | Admitting: Cardiovascular Disease

## 2022-12-12 NOTE — Progress Notes (Unsigned)
Cardiology Office Note:  .   Date:  12/14/2022  ID:  [name], DOB [birth year], MRN [id number] PCP: Craig Rung, DO  [facility] Providers Cardiologist:  None Electrophysiologist:  Craig Small, MD {  History of Present Illness: .   KHALIEF DIEGEL is a 72 y.o. male w/PMHx of HTN, sickle cell trait, PPM, AFib, CAD/?NSTEMI   Stroke thrombectomy, atherectomy, and placement of a stent >> felt to be 2/2 atheroma >>> months later via PPM found with AFib (single episode of 2 hours)  Saw Dr. Nelly Reyes 06/19/22, pt was aware of the Afib w/palpitations, sense of rapid beats.  Given the brevity of the event, risks of a/c felt to outweigh benefit and OAC not started  Admitted 11/15/22, w/chest pressure, nausea, retching> HS trop peaked >24,000 > moderate D1/Om dx and stent to PLB  + cocaine use reported Discharged 11/18/22  Today's visit is scheduled as a 6 mo visit  ROS:   He feels well No cath site concerns, complaints, healed well No CP, palpitations No SOB except  for a couple hours after he takes the Brilinta and asks about changing it perhaps No dizzy spells, near syncope or syncope  Denies any ongoing cocaine/drug use Still smoking but less and working on it  Device information MDT dual chamber PPM implanted 01/11/2009 (elsewhere)  Arrhythmia/AAD hx Single short AFib episode noted via PPM discussed by Dr. Nelly Reyes April 2024 No AAD to date  Studies Reviewed: Marland Kitchen    EKG not done today  DEVICE interrogation done today and reviewed by myself Battery and lead measurements are good HVR episodes reviewed, most are AV, though 2 true NSVTs The last was the day of his MI admission, none since 2 Fast AV episodes noted back in April, none since No AFib  11/16/22: LHC/PCI Conclusions: Moderate to severe multivessel coronary artery disease, as detailed below, predominantly affecting distal/branch vessels.  Culprit lesion for the patient's NSTEMI appears to be a 95%  stenosis involving moderate-caliber rPL1 branch. Mid inferior hypokinesis with otherwise preserved left ventricular systolic function (LVEF 55-65%). Normal left ventricular filling pressure (LVEDP 7 mmHg). Challenging but successful PCI to rPL1 using Onyx Frontier 2.25 x 22 mm drug-eluting stent with 0% residual stenosis and TIMI-3 flow.   Recommendations: Dual antiplatelet therapy with aspirin and ticagrelor for at least 12 months. Aggressive secondary prevention of coronary artery disease. Obtain echocardiogram. Favor medical therapy of diagonal/distal LCx disease.  11/16/22: TTE 1. Left ventricular ejection fraction, by estimation, is 55 to 60%. The  left ventricle has normal function. The left ventricle has no regional  wall motion abnormalities. There is mild left ventricular hypertrophy.  Left ventricular diastolic parameters  are consistent with Grade I diastolic dysfunction (impaired relaxation).   2. Right ventricular systolic function is normal. The right ventricular  size is normal.   3. The mitral valve is normal in structure. No evidence of mitral valve  regurgitation.   4. The aortic valve is tricuspid. Aortic valve regurgitation is not  visualized.   5. The inferior vena cava is normal in size with greater than 50%  respiratory variability, suggesting right atrial pressure of 3 mmHg.    Risk Assessment/Calculations:    Physical Exam:   VS:  BP 110/62   Pulse 67   Ht 6\' 1"  (1.854 m)   Wt 148 lb (67.1 kg)   SpO2 98%   BMI 19.53 kg/m    Wt Readings from Last 3 Encounters:  12/14/22 148 lb (  67.1 kg)  11/19/22 147 lb 8 oz (66.9 kg)  11/15/22 146 lb 6.4 oz (66.4 kg)    GEN: Well nourished, well developed in no acute distress NECK: No JVD; No carotid bruits CARDIAC: RRR, no murmurs, rubs, gallops RESPIRATORY:   CTA b/l without rales, wheezing or rhonchi  ABDOMEN: Soft, non-tender, non-distended EXTREMITIES:  No edema; No deformity   PPM site: is stable, no  thinning, fluctuation, tethering  ASSESSMENT AND PLAN: .    PPM Functioning normally No programming changes made  CAD S/p MI (11/2022 w/PCI) No CP Declines cardiac rehab, but would like to get back ion his bike, be active  D/w DOD today, [name] advised he try to take a Reyes amount of drink w/caffeine  prior to the brilinta to see if that helps the SOB with it Or Change to plavix 300mg  single dose followed by 75mg  daily  The pt likes coffee so he will try that first  HTN Looks good  HLD On high intensity statin  Paroxysmal Afib Noted incidentally via his device, planned to monitor burden no burden Not on [facility]      Cardiac Rehabilitation Eligibility Assessment  The patient has declined or is not appropriate for cardiac rehabilitation. Other (pt declined)        Dispo: will have him get established with [name] out patient, see them in 6-8 weeks, sooner if needed,  EP back in a year again, sooner if needed  Signed, Sheilah Pigeon, PA-C

## 2022-12-14 ENCOUNTER — Ambulatory Visit: Payer: 59 | Attending: Cardiovascular Disease | Admitting: Physician Assistant

## 2022-12-14 ENCOUNTER — Encounter: Payer: Self-pay | Admitting: Physician Assistant

## 2022-12-14 VITALS — BP 110/62 | HR 67 | Ht 73.0 in | Wt 148.0 lb

## 2022-12-14 DIAGNOSIS — Z950 Presence of cardiac pacemaker: Secondary | ICD-10-CM | POA: Diagnosis not present

## 2022-12-14 DIAGNOSIS — E785 Hyperlipidemia, unspecified: Secondary | ICD-10-CM

## 2022-12-14 DIAGNOSIS — I10 Essential (primary) hypertension: Secondary | ICD-10-CM | POA: Diagnosis not present

## 2022-12-14 DIAGNOSIS — I480 Paroxysmal atrial fibrillation: Secondary | ICD-10-CM

## 2022-12-14 DIAGNOSIS — I2510 Atherosclerotic heart disease of native coronary artery without angina pectoris: Secondary | ICD-10-CM

## 2022-12-14 LAB — CUP PACEART INCLINIC DEVICE CHECK
Battery Impedance: 3069 Ohm
Battery Remaining Longevity: 23 mo
Battery Voltage: 2.74 V
Brady Statistic AP VP Percent: 0 %
Brady Statistic AP VS Percent: 26 %
Brady Statistic AS VP Percent: 1 %
Brady Statistic AS VS Percent: 73 %
Date Time Interrogation Session: 20241007121233
Implantable Lead Connection Status: 753985
Implantable Lead Connection Status: 753985
Implantable Lead Implant Date: 20101105
Implantable Lead Implant Date: 20101105
Implantable Lead Location: 753859
Implantable Lead Location: 753860
Implantable Lead Model: 5076
Implantable Lead Model: 5076
Implantable Pulse Generator Implant Date: 20101105
Lead Channel Impedance Value: 441 Ohm
Lead Channel Impedance Value: 518 Ohm
Lead Channel Pacing Threshold Amplitude: 0.75 V
Lead Channel Pacing Threshold Amplitude: 0.75 V
Lead Channel Pacing Threshold Amplitude: 1 V
Lead Channel Pacing Threshold Amplitude: 1 V
Lead Channel Pacing Threshold Pulse Width: 0.4 ms
Lead Channel Pacing Threshold Pulse Width: 0.4 ms
Lead Channel Pacing Threshold Pulse Width: 0.4 ms
Lead Channel Pacing Threshold Pulse Width: 0.4 ms
Lead Channel Sensing Intrinsic Amplitude: 4 mV
Lead Channel Sensing Intrinsic Amplitude: 8 mV
Lead Channel Setting Pacing Amplitude: 2 V
Lead Channel Setting Pacing Amplitude: 2.5 V
Lead Channel Setting Pacing Pulse Width: 0.4 ms
Lead Channel Setting Sensing Sensitivity: 2.8 mV
Zone Setting Status: 755011
Zone Setting Status: 755011

## 2022-12-14 NOTE — Patient Instructions (Signed)
Medication Instructions:   Your physician recommends that you continue on your current medications as directed. Please refer to the Current Medication list given to you today.  *If you need a refill on your cardiac medications before your next appointment, please call your pharmacy*   Lab Work:  NONE ORDERED  TODAY   If you have labs (blood work) drawn today and your tests are completely normal, you will receive your results only by: MyChart Message (if you have MyChart) OR A paper copy in the mail If you have any lab test that is abnormal or we need to change your treatment, we will call you to review the results.   Testing/Procedures: NONE ORDERED  TODAY     Follow-Up: At [facility], you and your health needs are our priority.  As part of our continuing mission to provide you with exceptional heart care, we have created designated Provider Care Teams.  These Care Teams include your primary Cardiologist (physician) and Advanced Practice Providers (APPs -  Physician Assistants and Nurse Practitioners) who all work together to provide you with the care you need, when you need it.  We recommend signing up for the patient portal called "MyChart".  Sign up information is provided on this After Visit Summary.  MyChart is used to connect with patients for Virtual Visits (Telemedicine).  Patients are able to view lab/test results, encounter notes, upcoming appointments, etc.  Non-urgent messages can be sent to your provider as well.   To learn more about what you can do with MyChart, go to [email or web address].    Your next appointment:   6 week(s)  Provider:  Dr. Isac Caddy   [place] / [place]   ONE YEAR FOLLOW UP Other Instructions

## 2022-12-18 ENCOUNTER — Encounter: Payer: 59 | Admitting: Cardiovascular Disease

## 2022-12-22 ENCOUNTER — Encounter: Payer: 59 | Admitting: Cardiovascular Disease

## 2022-12-29 ENCOUNTER — Ambulatory Visit: Payer: Medicare HMO

## 2023-01-22 ENCOUNTER — Telehealth: Payer: Self-pay

## 2023-01-22 NOTE — Telephone Encounter (Signed)
Attempted to call no answer.  It looks like spironolactone was stopped during his hospitalization (no mention of why and review of the notes suggest that his BP was normal)  looks like his BP has been fine since the hospitalization so if he has been taking the spironolactone I would recommend he continue taking it to keep his BP normal and if he has not would recommend he stay off for now as his BP is normal.

## 2023-01-22 NOTE — Telephone Encounter (Signed)
RTC to patient message left that Clinics had returned his call about his questions about the Aldactone.

## 2023-01-22 NOTE — Telephone Encounter (Signed)
Pt is needing a call back ...  He stated that he went and picked up his med (pironolactone (ALDACTONE) 25 MG tablet [7437] )  yesterday but he does not know what it is for I checked his chart and the med has been D/c on 9/11 by : Arma Heading, [facility] on Dr  and stated stop taking  also on 9/9 Dr Benito Mccreedy also D/C med

## 2023-01-25 NOTE — Progress Notes (Unsigned)
Cardiology Office Note:  .   Date:  01/26/2023  ID:  [name], DOB 01-Feb-1951, MRN [id number] PCP: Gust Rung, DO  [facility] Providers Cardiologist:  Charlton Haws, MD Electrophysiologist:  Maurice Small, MD {  History of Present Illness: .   [name] is a 73 y.o. male with a past medical history of HTN, sickle cell trait, PPM, atrial fibrillation, CAD/questionable NSTEMI here for follow-up appointment.  History includes stroke thrombectomy, atherectomy, and placement of stent felt to be secondary to atheroma then months later via PPM found atrial fibrillation (single episode of 2 hours).  Saw Dr. Miller/12/24 and patient was aware of the A-fib with palpitations and sense of rapid heartbeats.  Given the brevity of the event, risk of AC felt to outweigh benefits and OAC was not started.  Unfortunately he was admitted 11/15/2022 with chest pressure and nausea, retching with peak troponin 24,000.  Moderate D1/OM Dx and stent to PLV.  Positive cocaine use reported.  Discharged 11/18/2022.  Was seen by EP last in September.  Was feeling well without any concerns or complaints at that time.  Denied ongoing cocaine use but still smokes.  Today, he presents with a history of atrial fibrillation, stent placement in the heart and head, and a recent hospitalization, reports feeling "at least eighty five percent" better. He denies any further issues with atrial fibrillation, chest pain, or shortness of breath. He reports a previous issue with breathing, which was attributed to the side effects of Brilinta. This was managed by consuming caffeine, which helped to open up the bronchial tubes.  The patient also has a pacemaker and reports no issues with it. He has been unable to find his remote check-in device for the pacemaker due to his house being in disarray.  He reports a history of smoking, but has cut back significantly and expresses a desire to quit. He has been  smoking for over sixty years and also has a history of drug use.  The patient also reports being on a regimen of eight pills a day, which was reduced to four in the morning and four at night after consultation with his doctor. He reports not being a water drinker and consuming cheer wine instead.  The patient also reports being active, riding his bike to keep himself motivated and moving. He expresses concern about the side effects of Brilinta, particularly feeling cold.  Reports no shortness of breath nor dyspnea on exertion. Reports no chest pain, pressure, or tightness. No edema, orthopnea, PND. Reports no palpitations.   Discussed the use of AI scribe software for clinical note transcription with the patient, who gave verbal consent to proceed.   ROS: Pertinent ROS in HPI  Studies Reviewed: .       Cardiac cath 11/16/22 Left Main  Vessel is large. Vessel is angiographically normal.    Left Anterior Descending  Vessel is large.  Mid LAD to Dist LAD lesion is 40% stenosed.    First Diagonal Branch  Vessel is moderate in size.    Second Diagonal Branch  Vessel is large in size.  2nd Diag lesion is 60% stenosed.    Third Diagonal Branch  Vessel is large in size.  3rd Diag lesion is 50% stenosed.    Left Circumflex  Vessel is large.  Mid Cx lesion is 70% stenosed.    First Obtuse Marginal Branch  Vessel is moderate in size.    Second Obtuse Marginal Branch  Vessel is  small in size.    Right Coronary Artery  Vessel is large. There is mild diffuse disease throughout the vessel. The vessel is mildly tortuous.    Right Posterior Descending Artery  Vessel is large in size.    Right Posterior Atrioventricular Artery  Vessel is large in size.    First Right Posterolateral Branch  Vessel is moderate in size.  1st RPL lesion is 95% stenosed. Vessel is the culprit lesion.    Second Right Posterolateral Branch  Vessel is small in size.    Third Right Posterolateral  Branch  Vessel is small in size.    Intervention   1st RPL lesion  Stent  CATH LAUNCHER 6FR AR2 guide catheter was inserted. Lesion crossed with guidewire using a WIRE RUNTHROUGH .[id number]. Pre-stent angioplasty was performed. A drug-eluting stent was successfully placed using a STENT ONYX FRONTIER 2.25X22. Maximum pressure: 16 atm. Stent strut is well apposed. Post-stent angioplasty was performed using a BALLN [place] EMERGE MR 2.5X12. Maximum pressure: 18 atm.  Post-Intervention Lesion Assessment  The intervention was successful. Pre-interventional TIMI flow is 2. Post-intervention TIMI flow is 3. No complications occurred at this lesion.  There is a 0% residual stenosis post intervention.     Wall Motion              Left Heart  Left Ventricle The left ventricular size is normal. The left ventricular systolic function is normal. LV end diastolic pressure is normal. LVEDP 7 mmHg. The left ventricular ejection fraction is 55-65% by visual estimate.  Aortic Valve There is no aortic valve stenosis.   Coronary Diagrams  Diagnostic Dominance: Right  Intervention      11/16/22: LHC/PCI Conclusions: Moderate to severe multivessel coronary artery disease, as detailed below, predominantly affecting distal/branch vessels.  Culprit lesion for the patient's NSTEMI appears to be a 95% stenosis involving moderate-caliber rPL1 branch. Mid inferior hypokinesis with otherwise preserved left ventricular systolic function (LVEF 55-65%). Normal left ventricular filling pressure (LVEDP 7 mmHg). Challenging but successful PCI to rPL1 using Onyx Frontier 2.25 x 22 mm drug-eluting stent with 0% residual stenosis and TIMI-3 flow.   Recommendations: Dual antiplatelet therapy with aspirin and ticagrelor for at least 12 months. Aggressive secondary prevention of coronary artery disease. Obtain echocardiogram. Favor medical therapy of diagonal/distal LCx disease.   11/16/22: TTE 1. Left ventricular  ejection fraction, by estimation, is 55 to 60%. The  left ventricle has normal function. The left ventricle has no regional  wall motion abnormalities. There is mild left ventricular hypertrophy.  Left ventricular diastolic parameters  are consistent with Grade I diastolic dysfunction (impaired relaxation).   2. Right ventricular systolic function is normal. The right ventricular  size is normal.   3. The mitral valve is normal in structure. No evidence of mitral valve  regurgitation.   4. The aortic valve is tricuspid. Aortic valve regurgitation is not  visualized.   5. The inferior vena cava is normal in size with greater than 50%  respiratory variability, suggesting right atrial pressure of 3 mmHg.       Physical Exam:   VS:  BP 118/82 (BP Location: Right Arm, Patient Position: Sitting, Cuff Size: Normal)   Pulse 86   Resp 16   Ht 6\' 1"  (1.854 m)   Wt 150 lb (68 kg)   SpO2 98%   BMI 19.79 kg/m    Wt Readings from Last 3 Encounters:  01/26/23 150 lb (68 kg)  12/14/22 148 lb (67.1 kg)  11/19/22 147  lb 8 oz (66.9 kg)    GEN: Well nourished, well developed in no acute distress NECK: No JVD; No carotid bruits CARDIAC: RRR, no murmurs, rubs, gallops RESPIRATORY:  Clear to auscultation without rales, wheezing or rhonchi  ABDOMEN: Soft, non-tender, non-distended EXTREMITIES:  No edema; No deformity   ASSESSMENT AND PLAN: .    HTN -well controlled today -would continue current medications -continue low sodium, heart healthy diet -discussed cutting out soda and drinking more water  HLD -recommended continue current medications with zetia and lipitor -he will need some updated labs at his next visit -LDL 95 which is above goal  Atrial Fibrillation Stable with no recent episodes. Pacemaker in place and functioning well. -Continue current management.  Coronary Artery Disease Status post stent placement. No recent chest pain. On Brilinta with noted side effect of  bronchospasm, managed with caffeine. -Continue Brilinta and aspirin. -Encourage patient to maintain hydration and consider reducing caffeine intake.  Tobacco Use Reduced but ongoing smoking. Expressed desire to quit. -Offered resources for smoking cessation for patient to consider.  Chronic Kidney Disease Slight elevation in creatinine, possibly related to medication burden or dehydration. -Encourage hydration and continue monitoring renal function.  General Health Maintenance -Encourage physical activity, patient reports biking. -Continue monitoring blood pressure, cholesterol, and kidney function. -Plan to follow up on pacemaker function once patient locates remote check-in device.      Dispo: Follow-up in 3-4 months with Dr. Eden Emms or APP  Signed, Sharlene Dory, PA-C

## 2023-01-26 ENCOUNTER — Ambulatory Visit: Payer: 59 | Attending: Physician Assistant | Admitting: Physician Assistant

## 2023-01-26 ENCOUNTER — Encounter: Payer: Self-pay | Admitting: Physician Assistant

## 2023-01-26 VITALS — BP 118/82 | HR 86 | Resp 16 | Ht 73.0 in | Wt 150.0 lb

## 2023-01-26 DIAGNOSIS — I10 Essential (primary) hypertension: Secondary | ICD-10-CM | POA: Diagnosis not present

## 2023-01-26 DIAGNOSIS — E785 Hyperlipidemia, unspecified: Secondary | ICD-10-CM

## 2023-01-26 DIAGNOSIS — Z950 Presence of cardiac pacemaker: Secondary | ICD-10-CM

## 2023-01-26 DIAGNOSIS — I2510 Atherosclerotic heart disease of native coronary artery without angina pectoris: Secondary | ICD-10-CM

## 2023-01-26 DIAGNOSIS — I480 Paroxysmal atrial fibrillation: Secondary | ICD-10-CM

## 2023-01-26 NOTE — Patient Instructions (Signed)
Medication Instructions:  No changes *If you need a refill on your cardiac medications before your next appointment, please call your pharmacy*   Lab Work: No labs   Testing/Procedures: No testing   Follow-Up: At [facility], you and your health needs are our priority.  As part of our continuing mission to provide you with exceptional heart care, we have created designated Provider Care Teams.  These Care Teams include your primary Cardiologist (physician) and Advanced Practice Providers (APPs -  Physician Assistants and Nurse Practitioners) who all work together to provide you with the care you need, when you need it.  We recommend signing up for the patient portal called "MyChart".  Sign up information is provided on this After Visit Summary.  MyChart is used to connect with patients for Virtual Visits (Telemedicine).  Patients are able to view lab/test results, encounter notes, upcoming appointments, etc.  Non-urgent messages can be sent to your provider as well.   To learn more about what you can do with MyChart, go to [email or web address].    Your next appointment:   3-4 month(s)  Provider:   Charlton Haws, MD or any APP

## 2023-02-05 ENCOUNTER — Ambulatory Visit: Payer: 59

## 2023-02-15 ENCOUNTER — Ambulatory Visit: Payer: Self-pay

## 2023-02-25 ENCOUNTER — Encounter: Payer: Self-pay | Admitting: Internal Medicine

## 2023-02-25 ENCOUNTER — Ambulatory Visit: Payer: 59 | Admitting: Internal Medicine

## 2023-02-25 VITALS — BP 105/77 | HR 72 | Temp 97.8°F | Ht 73.0 in | Wt 147.8 lb

## 2023-02-25 DIAGNOSIS — J069 Acute upper respiratory infection, unspecified: Secondary | ICD-10-CM

## 2023-02-25 DIAGNOSIS — I10 Essential (primary) hypertension: Secondary | ICD-10-CM

## 2023-02-25 DIAGNOSIS — I2510 Atherosclerotic heart disease of native coronary artery without angina pectoris: Secondary | ICD-10-CM | POA: Diagnosis not present

## 2023-02-25 MED ORDER — BENZONATATE 200 MG PO CAPS: 200.0000 mg | ORAL_CAPSULE | Freq: Three times a day (TID) | ORAL | 0 refills | Status: DC | PRN

## 2023-02-25 NOTE — Progress Notes (Signed)
Established Patient Office Visit  Subjective   Patient ID: [name], male    DOB: 1951/01/09  Age: 72 y.o. MRN: [id number]  Chief Complaint  Patient presents with   Follow-up    Chest congestion. H/A. Sinus problem.   [name] was originally scheduled for follow-up of his hypertension, history of CVA and history of CAD.  He confirms he is taking all of his medications as directed to the best of his knowledge.  There may be some question of whether he is taking the spironolactone however he reports he has taken a total of 8 medications takes Brilinta twice daily the other medications he splits between morning and night.  Since his wife knows full details better and is unable to verbally confirm names of medications.  He is fairly confident he is taking aspirin and Brilinta twice daily.  He is also concerned he was recently at a gathering where there were some sick people following this he started to have symptoms of coughing headache and some sinus congestion the symptoms started 2 days ago.  He is completed the initial series of the Pfizer COVID-vaccine but has never had any boosters.  Overall he feels his symptoms are fairly mild like a head cold the cough is bothersome he is interested in testing to see what he has.       Objective:     BP 105/77 (BP Location: Right Arm, Patient Position: Sitting, Cuff Size: Normal)   Pulse 72   Temp 97.8 F (36.6 C) (Oral)   Ht 6\' 1"  (1.854 m)   Wt 147 lb 12.8 oz (67 kg)   SpO2 100% Comment: RA  BMI 19.50 kg/m  BP Readings from Last 3 Encounters:  02/25/23 105/77  01/26/23 118/82  12/14/22 110/62   Wt Readings from Last 3 Encounters:  02/25/23 147 lb 12.8 oz (67 kg)  01/26/23 150 lb (68 kg)  12/14/22 148 lb (67.1 kg)      Physical Exam Vitals and nursing note reviewed.  Constitutional:      Appearance: Normal appearance.  Cardiovascular:     Rate and Rhythm: Normal rate and regular rhythm.  Pulmonary:      Effort: Pulmonary effort is normal. No respiratory distress.     Breath sounds: Normal breath sounds. No stridor. No wheezing or rhonchi.  Neurological:     General: No focal deficit present.     Mental Status: He is alert and oriented to person, place, and time.  Psychiatric:        Mood and Affect: Mood normal.      No results found for any visits on 02/25/23.  Last CBC Lab Results  Component Value Date   WBC 5.5 11/17/2022   HGB 11.3 (L) 11/17/2022   HCT 32.5 (L) 11/17/2022   MCV 83.5 11/17/2022   MCH 29.0 11/17/2022   RDW 16.7 (H) 11/17/2022   PLT 193 11/17/2022   Last metabolic panel Lab Results  Component Value Date   GLUCOSE 102 (H) 11/18/2022   NA 136 11/18/2022   K 4.0 11/18/2022   CL 100 11/18/2022   CO2 27 11/18/2022   BUN 13 11/18/2022   CREATININE 1.25 (H) 11/18/2022   GFRNONAA >60 11/18/2022   CALCIUM 9.0 11/18/2022   PHOS 2.0 (L) 04/25/2022   PROT 7.4 04/23/2022   ALBUMIN 3.8 04/23/2022   LABGLOB 2.4 12/25/2021   AGRATIO 1.8 12/25/2021   BILITOT 0.5 04/23/2022   ALKPHOS 110 04/23/2022   AST 31  04/23/2022   ALT 28 04/23/2022   ANIONGAP 9 11/18/2022   Last lipids Lab Results  Component Value Date   CHOL 163 11/15/2022   HDL 58 11/15/2022   LDLCALC 95 11/15/2022   TRIG 49 11/15/2022   CHOLHDL 2.8 11/15/2022   Last hemoglobin A1c Lab Results  Component Value Date   HGBA1C 5.3 04/07/2022      The ASCVD Risk score (Arnett DK, et al., 2019) failed to calculate for the following reasons:   Risk score cannot be calculated because patient has a medical history suggesting prior/existing ASCVD    Assessment & Plan:   Problem List Items Addressed This Visit       Cardiovascular and Mediastinum   Essential hypertension (Chronic)   Blood pressure well-controlled today I am not can make any changes to his regimen I have asked him to bring his medication bottles to his next visit just so that we can confirm that what we think he is taking is  what he is actually taking.      CAD (coronary artery disease), native coronary artery   Reviewed with him the importance of taking dual antiplatelet therapy for at least 1 year.  I am fairly confident he is doing this.  Advised tobacco cessation and avoidance of cocaine.        Respiratory   Viral upper respiratory tract infection - Primary   Advised rest, masking around others.  Provided prescription for benzonatate Perles.  COVID RSV and flu testing obtained.      Relevant Orders   COVID-19, Flu A+B and RSV    Return in about 6 months (around 08/26/2023).    Gust Rung, DO

## 2023-02-26 NOTE — Assessment & Plan Note (Signed)
Reviewed with him the importance of taking dual antiplatelet therapy for at least 1 year.  I am fairly confident he is doing this.  Advised tobacco cessation and avoidance of cocaine.

## 2023-02-26 NOTE — Assessment & Plan Note (Signed)
Blood pressure well-controlled today I am not can make any changes to his regimen I have asked him to bring his medication bottles to his next visit just so that we can confirm that what we think he is taking is what he is actually taking.

## 2023-02-26 NOTE — Assessment & Plan Note (Signed)
Advised rest, masking around others.  Provided prescription for benzonatate Perles.  COVID RSV and flu testing obtained.

## 2023-02-27 LAB — COVID-19, FLU A+B AND RSV
Influenza A, NAA: NOT DETECTED
Influenza B, NAA: NOT DETECTED
RSV, NAA: NOT DETECTED
SARS-CoV-2, NAA: NOT DETECTED

## 2023-03-30 ENCOUNTER — Ambulatory Visit: Payer: Medicare HMO

## 2023-04-16 ENCOUNTER — Telehealth: Payer: Self-pay

## 2023-04-16 NOTE — Progress Notes (Deleted)
 CARDIOLOGY CONSULT NOTE       Patient ID: [name] MRN: [id number] DOB/AGE: 75-11-1950 75 y.o.  Primary Physician: Gust Rung, DO Primary Cardiologist: Marsa Aris Mealor   HPI:  73 y.o. first seen in hospital 11/15/22 for chest pain. This was in setting of stroke with slurred speech and left sided weakness. Required thrombectomy of M1/MCA segments by IR radiology. His initial troponin was 626  Urine also positive for cocaine Also has history of PPM, HTN and HLD ? Transient PAF as well  Cath 11/16/22 Dr End moderate mid LCX/LAD/D1 dx 95% RPL stenosis that was stented Echo same day with normal EF 55-60% no sigificant valve dx.   Still smoking   ***  ROS All other systems reviewed and negative except as noted above  Past Medical History:  Diagnosis Date   Atrial tachycardia (HCC) 04/11/2012   Bilateral flank pain 06/26/2020   CEREBROVASCULAR ACCIDENT 10/17/2008   Acute CVA of right thalamus 10/03/08 ECHO performed 2/2 CVA 10/09/08, EF 55-65%  Aspirin 325 mg daily and try to work on quitting smoking.    Emphysema    per multiple CXRs   Hypertension    goal < 140/90   Sickle cell trait (HCC)    Snoring    Sleep study 09/02/10 was WNL   Stroke [facility]) 10/03/2008   Right thalamus   TIA (transient ischemic attack) 12/25/2021   Tuberculosis at age [age]    Family History  Problem Relation Age of Onset   Hypertension Mother    Cancer Sister        Unknown   Hypertension Sister    Diabetes Sister    Cancer Sister    Sickle cell trait Son    Sickle cell anemia Son     Social History   Socioeconomic History   Marital status: Married    Spouse name: Not on file   Number of children: 6   Years of education: 15   Highest education level: Not on file  Occupational History   Occupation: Disabled    Comment: PT Administrator at [employer]  Tobacco Use   Smoking status: Every Day    Current packs/day: 0.20    Average packs/day: 0.2 packs/day for 10.0 years (2.0 ttl pk-yrs)     Types: Cigarettes   Smokeless tobacco: Never   Tobacco comments:    2-3 cigs daily  Vaping Use   Vaping status: Former  Substance and Sexual Activity   Alcohol use: No    Alcohol/week: 0.0 standard drinks of alcohol   Drug use: No    Comment: previous polysubstance abuser, quit x 13 years   Sexual activity: Not Currently  Other Topics Concern   Not on file  Social History Narrative   Current Social History 06/29/2019        Patient lives with spouse in a ground floor apartment which is 1 story. There are not steps up to the entrance the patient uses.       Patient's method of transportation is personal car.      The highest level of education was Bachelor's Degree; now working on [employer].      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      Identified important Relationships are "My wife"      Pets : None       Interests / Fun: Computers, "I'm an Chief Technology Officer."  Current Stressors: "I hardly let anything bother me. I walk away"       Religious / Personal Beliefs: Pentecostal Holiness       L. Ducatte, BSN, RN-BC       Social Drivers of Health   Financial Resource Strain: Medium Risk (01/15/2022)   Overall Financial Resource Strain (CARDIA)    Difficulty of Paying Living Expenses: Somewhat hard  Food Insecurity: No Food Insecurity (11/16/2022)   Hunger Vital Sign    Worried About Running Out of Food in the Last Year: Never true    Ran Out of Food in the Last Year: Never true  Transportation Needs: No Transportation Needs (11/16/2022)   PRAPARE - Administrator, Civil Service (Medical): No    Lack of Transportation (Non-Medical): No  Physical Activity: Insufficiently Active (01/15/2022)   Exercise Vital Sign    Days of Exercise per Week: 3 days    Minutes of Exercise per Session: 20 min  Stress: Stress Concern Present (01/15/2022)   [employer] of Occupational Health  - Occupational Stress Questionnaire    Feeling of Stress : To some extent  Social Connections: Socially Integrated (01/15/2022)   Social Connection and Isolation Panel [NHANES]    Frequency of Communication with Friends and Family: More than three times a week    Frequency of Social Gatherings with Friends and Family: Once a week    Attends Religious Services: More than 4 times per year    Active Member of [employer] or Organizations: Yes    Attends Banker Meetings: Never    Marital Status: Married  Catering manager Violence: Not At Risk (11/16/2022)   Humiliation, Afraid, Rape, and Kick questionnaire    Fear of Current or Ex-Partner: No    Emotionally Abused: No    Physically Abused: No    Sexually Abused: No    Past Surgical History:  Procedure Laterality Date   APPENDECTOMY  1977   CORONARY STENT INTERVENTION N/A 11/16/2022   Procedure: CORONARY STENT INTERVENTION;  Surgeon: Yvonne Kendall, MD;  Location: [facility];  Service: Cardiovascular;  Laterality: N/A;   IR ANGIO INTRA EXTRACRAN SEL COM CAROTID INNOMINATE UNI L MOD SED  11/12/2022   IR ANGIO INTRA EXTRACRAN SEL INTERNAL CAROTID UNI R MOD SED  11/12/2022   IR ANGIO VERTEBRAL SEL VERTEBRAL UNI R MOD SED  11/12/2022   IR CT HEAD LTD  04/23/2022   IR CT HEAD LTD  04/23/2022   IR PERCUTANEOUS ART THROMBECTOMY/INFUSION INTRACRANIAL INC DIAG ANGIO  04/23/2022   IR PTA INTRACRANIAL  04/23/2022   IR US GUIDE VASC ACCESS RIGHT  04/23/2022   IR US GUIDE VASC ACCESS RIGHT  04/23/2022   IR US GUIDE VASC ACCESS RIGHT  11/12/2022   LEFT HEART CATH AND CORONARY ANGIOGRAPHY N/A 11/16/2022   Procedure: LEFT HEART CATH AND CORONARY ANGIOGRAPHY;  Surgeon: Yvonne Kendall, MD;  Location: [facility];  Service: Cardiovascular;  Laterality: N/A;   PACEMAKER INSERTION  07/24/08   MDT Adapta L implanted by Dr Fredrich Birks   RADIOLOGY WITH ANESTHESIA N/A 04/23/2022   Procedure: IR WITH ANESTHESIA;  Surgeon: Radiologist, Medication, MD;   Location: [facility] OR;  Service: Radiology;  Laterality: N/A;   VASECTOMY  1990      Current Outpatient Medications:    albuterol (VENTOLIN HFA) 108 (90 Base) MCG/ACT inhaler, Inhale 2 puffs into the lungs as needed for wheezing or shortness of breath., Disp: 18 g, Rfl: 2   amLODipine (NORVASC)  10 MG tablet, Take 1 tablet (10 mg total) by mouth daily., Disp: 90 tablet, Rfl: 3   aspirin EC 81 MG tablet, Take 1 tablet (81 mg total) by mouth daily. Swallow whole., Disp: 120 tablet, Rfl: 12   atorvastatin (LIPITOR) 80 MG tablet, Take 1 tablet (80 mg total) by mouth daily., Disp: 90 tablet, Rfl: 3   benzonatate (TESSALON) 200 MG capsule, Take 1 capsule (200 mg total) by mouth 3 (three) times daily as needed for cough., Disp: 30 capsule, Rfl: 0   ezetimibe (ZETIA) 10 MG tablet, Take 1 tablet (10 mg total) by mouth daily., Disp: 90 tablet, Rfl: 3   fluticasone (FLONASE) 50 MCG/ACT nasal spray, Place 1 spray into both nostrils daily., Disp: 16 g, Rfl: 3   isosorbide mononitrate (IMDUR) 60 MG 24 hr tablet, Take 1 tablet (60 mg total) by mouth daily., Disp: 90 tablet, Rfl: 3   metoprolol succinate (TOPROL-XL) 25 MG 24 hr tablet, Take 1 tablet (25 mg total) by mouth daily. Take with or immediately following a meal., Disp: 90 tablet, Rfl: 3   nitroGLYCERIN (NITROSTAT) 0.4 MG SL tablet, Place 1 tablet (0.4 mg total) under the tongue every 5 (five) minutes as needed for chest pain., Disp: 30 tablet, Rfl: 0   spironolactone (ALDACTONE) 25 MG tablet, Take 25 mg by mouth daily. (Patient not taking: Reported on 01/26/2023), Disp: , Rfl:    ticagrelor (BRILINTA) 90 MG TABS tablet, Take 1 tablet (90 mg total) by mouth 2 (two) times daily., Disp: 180 tablet, Rfl: 3   umeclidinium bromide (INCRUSE ELLIPTA) 62.5 MCG/ACT AEPB, Inhale 1 puff into the lungs daily., Disp: 90 each, Rfl: 3    Physical Exam: There were no vitals taken for this visit.    Affect appropriate Healthy:  appears stated age HEENT: normal Neck  supple with no adenopathy JVP normal no bruits no thyromegaly Lungs clear with no wheezing and good diaphragmatic motion Heart:  S1/S2 no murmur, no rub, gallop or click PMI normal Abdomen: benighn, BS positve, no tenderness, no AAA no bruit.  No HSM or HJR Distal pulses intact with no bruits No edema Neuro LUE weakness from stroke  Skin warm and dry No muscular weakness   Labs:   Lab Results  Component Value Date   WBC 5.5 11/17/2022   HGB 11.3 (L) 11/17/2022   HCT 32.5 (L) 11/17/2022   MCV 83.5 11/17/2022   PLT 193 11/17/2022   No results for input(s): "NA", "K", "CL", "CO2", "BUN", "CREATININE", "CALCIUM", "PROT", "BILITOT", "ALKPHOS", "ALT", "AST", "GLUCOSE" in the last 168 hours.  Invalid input(s): "LABALBU" Lab Results  Component Value Date   CKTOTAL 81 09/14/2011   CKMB 1.4 09/14/2011   TROPONINI <0.30 09/14/2011    Lab Results  Component Value Date   CHOL 163 11/15/2022   CHOL 152 10/22/2022   CHOL 126 04/24/2022   Lab Results  Component Value Date   HDL 58 11/15/2022   HDL 55 10/22/2022   HDL 33 (L) 04/24/2022   Lab Results  Component Value Date   LDLCALC 95 11/15/2022   LDLCALC 82 10/22/2022   LDLCALC 82 04/24/2022   Lab Results  Component Value Date   TRIG 49 11/15/2022   TRIG 77 10/22/2022   TRIG 56 04/24/2022   Lab Results  Component Value Date   CHOLHDL 2.8 11/15/2022   CHOLHDL 2.8 10/22/2022   CHOLHDL 3.8 04/24/2022   No results found for: "LDLDIRECT"    Radiology: No results found.  EKG: ***  ASSESSMENT AND PLAN:   HTN -well controlled today -would continue current medications -continue low sodium, heart healthy diet -discussed cutting out soda and drinking more water   HLD -recommended continue current medications with zetia and lipitor -he will need some updated labs at his next visit -LDL 95 which is above goal   Atrial Fibrillation Stable with no recent episodes. Pacemaker in place and functioning  well. -Continue current management. - Single episode lasting 2 hours per Dr Nelly Laurence - He did not feel anticoagulation needed    Coronary Artery Disease Status post stent placement. No recent chest pain. On Brilinta with noted side effect of bronchospasm, managed with caffeine. -Continue Brilinta and aspirin. -Encourage patient to maintain hydration and consider reducing caffeine intake.   Tobacco Use Reduced but ongoing smoking. Expressed desire to quit. -Offered resources for smoking cessation for patient to consider.   MCA Stroke - not clear that it was embolic  - prior lacunar infarcts and cocaine use - ASA/Brilinta  - monitor for PAF with PPM   ***   F/U EP 6 months F/U general cardiology 1 year   Signed: Charlton Haws 04/16/2023, 3:28 PM

## 2023-04-16 NOTE — Telephone Encounter (Signed)
 Message received from Dr. Delford inquiring about missed transmissions. Per [employer] multiple attempts to reach patient unsuccessful for last missed transmission due on 02/17/23. Attempted to call pt. No answer. Unable to leave voicemail. Subsequently called wifes phone listed in patient contacts. No answer. LMTCB.

## 2023-04-29 ENCOUNTER — Ambulatory Visit: Payer: 59 | Admitting: Cardiovascular Disease

## 2023-05-06 ENCOUNTER — Ambulatory Visit: Payer: 59

## 2023-05-17 ENCOUNTER — Ambulatory Visit: Payer: Self-pay

## 2023-05-17 ENCOUNTER — Telehealth: Payer: Self-pay | Admitting: Cardiovascular Disease

## 2023-05-17 NOTE — Telephone Encounter (Signed)
 New message   Patient dropped off medical clearance request form from Drs. Susie Cassette and Assoc PA.  Form faxed to pre op teat at [phone]

## 2023-05-24 NOTE — Telephone Encounter (Signed)
 Left message for [facility] office if they could please fax over a clearance request for the pt. Fax # [phone].

## 2023-06-07 ENCOUNTER — Encounter: Payer: Self-pay | Admitting: Neurology

## 2023-06-18 ENCOUNTER — Ambulatory Visit: Payer: Self-pay

## 2023-06-18 NOTE — Telephone Encounter (Signed)
 Will send to PCP

## 2023-06-18 NOTE — Telephone Encounter (Signed)
 Copied from CRM [id number]. Topic: Clinical - Red Word Triage >> Jun 18, 2023 12:24 PM Alvino Blood C wrote: Red Word that prompted transfer to Nurse Triage: Patient states he's been having issues with his pace makes and has been having a funny feeling on his left side.   Chief Complaint: Concern with pacemaker  Symptoms: Tingling sensation from left neck to left arm  Frequency: Intermittent  Pertinent Negatives: Patient denies chest pain  Disposition: [] ED /[] Urgent Care (no appt availability in office) / [x] Appointment(In office/virtual)/ []  [facility]/ [] Home Care/ [] Refused Recommended Disposition /[] [facility]/ []  Follow-up with PCP Additional Notes: Patient reports that for about a month he has been experiencing intermittent tingling from his left sided neck to his left arm. He states that the symptoms usually last 1-2 minutes and then resolve on their own. He denies any chest pain or other associated symptoms. He states that he was talking to a nurse at his church who told him he should have his pacemaker checked to see if that's the cause. Appointment made for the patient next week for evaluation of his symptoms. Patient instructed to call back for new or worsening symptoms. Patient verbalized understanding and agreement with this plan.     Reason for Disposition  Anxiety or stress related to device  Answer Assessment - Initial Assessment Questions 1. DESCRIPTION: "Please describe the concern you have with your pacemaker or defibrillator" (e.g.,  delivered a shock, palpitations, beeping heard)     Tingling sensation left sided neck to left arm 2. ONSET: "When did this occur?" (e.g., minutes, hours or days)     Last month  3. DURATION: "How long did it last" (e.g., seconds, minutes, hours)      1-2 minutes  4. RECURRENT SYMPTOM: "Have you ever had this before?" If YES,  "When was the last time?" and "What happened that time?"      A few minutes ago  5. CARDIAC  HISTORY: "What other heart problems do you have?" (e.g., heart attack, angina, bypass surgery, stents, heart failure, rhythm problem)      CAD 6. OTHER SYMPTOMS: "Do you have any other symptoms?" (e.g., dizziness, chest pain, fever, sweating, difficulty breathing)     None  Protocols used: ICD and Pacemaker Symptoms and Questions-A-AH

## 2023-06-20 NOTE — Telephone Encounter (Signed)
 Looks like we have an appointment scheduled within an appropriate timeframe

## 2023-06-23 ENCOUNTER — Ambulatory Visit: Payer: Self-pay | Admitting: Internal Medicine

## 2023-06-23 ENCOUNTER — Telehealth: Payer: Self-pay | Admitting: Cardiovascular Disease

## 2023-06-23 NOTE — Telephone Encounter (Signed)
 Pt c/o of Chest Pain: STAT if active (IN THIS MOMENT) CP, including tightness, pressure, jaw pain, shoulder/upper arm/back pain, SOB, nausea, and vomiting.  1. Are you having CP right now (tightness, pressure, or discomfort)? Just arm tingling on the left side   2. Are you experiencing any other symptoms (ex. SOB, nausea, vomiting, sweating)? Did not mention anything  3. How long have you been experiencing CP?   4. Is your CP continuous or coming and going?   5. Have you taken Nitroglycerin?   6. If CP returns before callback, please consider calling 911. ?  Patient walked in to the office asking about an appt. Merry Abo in the front office called EP Scheduler regarding an appt. Asked Almira Armour if patient was having any other symptoms of his heart, he declined and stated only the left arm tingling is his only symptom. At this time patient walked away, Almira Armour stated she does not see the patient. She is aware I will start phone note, and send to triage.

## 2023-06-23 NOTE — Telephone Encounter (Signed)
 Left voicemail to return call to office

## 2023-06-30 NOTE — Telephone Encounter (Signed)
 Attempted to contact patient to follow up, no answer and unable to leave a message

## 2023-07-01 ENCOUNTER — Ambulatory Visit (INDEPENDENT_AMBULATORY_CARE_PROVIDER_SITE_OTHER): Payer: Self-pay | Admitting: Internal Medicine

## 2023-07-01 ENCOUNTER — Encounter: Payer: Self-pay | Admitting: Internal Medicine

## 2023-07-01 VITALS — BP 117/93 | HR 65 | Temp 97.5°F | Ht 73.0 in | Wt 155.8 lb

## 2023-07-01 DIAGNOSIS — M5412 Radiculopathy, cervical region: Secondary | ICD-10-CM | POA: Diagnosis not present

## 2023-07-11 NOTE — Progress Notes (Signed)
 Established Patient Office Visit  Subjective   Patient ID: [name], male    DOB: 1950-09-28  Age: 73 y.o. MRN: [id number]  Chief Complaint  Patient presents with   Left side neck tingling    X 2 months.   They do comes in for a visit to evaluate some left neck and shoulder pain.  He notes that this pain has been intermittent in the past but has been more present for about the past 2 months.  Starts in his upper neck and runs down to his left shoulder.  Occasionally feels tingling.  He has had no recent trauma to the area.  Last imaging was March 2024 with a cervical spine CT which revealed spondylosis and moderate facet hypertrophy and several areas most notably for the left side was foraminal narrowing at C4-C5, C3-C4, and C5-C6.     Objective:     BP (!) 117/93 (BP Location: Right Arm, Patient Position: Sitting, Cuff Size: Normal)   Pulse 65   Temp (!) 97.5 F (36.4 C) (Oral)   Ht 6\' 1"  (1.854 m)   Wt 155 lb 12.8 oz (70.7 kg)   SpO2 100% Comment: RA  BMI 20.56 kg/m  BP Readings from Last 3 Encounters:  07/01/23 (!) 117/93  02/25/23 105/77  01/26/23 118/82   Wt Readings from Last 3 Encounters:  07/01/23 155 lb 12.8 oz (70.7 kg)  02/25/23 147 lb 12.8 oz (67 kg)  01/26/23 150 lb (68 kg)      Physical Exam Constitutional:      Appearance: Normal appearance.  Cardiovascular:     Rate and Rhythm: Normal rate and regular rhythm.  Pulmonary:     Effort: Pulmonary effort is normal.     Breath sounds: Normal breath sounds.  Musculoskeletal:     Comments: Spurling test positive on left, full aROM of left shoulder  Neurological:     Mental Status: He is alert.      No results found for any visits on 07/01/23.  Last CBC Lab Results  Component Value Date   WBC 5.5 11/17/2022   HGB 11.3 (L) 11/17/2022   HCT 32.5 (L) 11/17/2022   MCV 83.5 11/17/2022   MCH 29.0 11/17/2022   RDW 16.7 (H) 11/17/2022   PLT 193 11/17/2022   Last metabolic panel Lab  Results  Component Value Date   GLUCOSE 102 (H) 11/18/2022   NA 136 11/18/2022   K 4.0 11/18/2022   CL 100 11/18/2022   CO2 27 11/18/2022   BUN 13 11/18/2022   CREATININE 1.25 (H) 11/18/2022   GFRNONAA >60 11/18/2022   CALCIUM  9.0 11/18/2022   PHOS 2.0 (L) 04/25/2022   PROT 7.4 04/23/2022   ALBUMIN 3.8 04/23/2022   LABGLOB 2.4 12/25/2021   AGRATIO 1.8 12/25/2021   BILITOT 0.5 04/23/2022   ALKPHOS 110 04/23/2022   AST 31 04/23/2022   ALT 28 04/23/2022   ANIONGAP 9 11/18/2022      The ASCVD Risk score (Arnett DK, et al., 2019) failed to calculate for the following reasons:   Risk score cannot be calculated because patient has a medical history suggesting prior/existing ASCVD    Assessment & Plan:   Problem List Items Addressed This Visit       Nervous and Auditory   Left cervical radiculopathy - Primary   Discussed his pain is an exacerbation of his known cervical issues on the left side he is not having any significant weakness in his shoulder or hand.  And discusses that he would not want surgery even if he did have weakness.  I discussed that usually regresses with some time and supportive care.  I printed cervical range of motion exercises for him which I think will help.  He will let me know if this worsens or fails to improve within about the next month.       Return in about 6 months (around 12/31/2023).    Priscella Brooms, DO

## 2023-07-11 NOTE — Assessment & Plan Note (Signed)
 Discussed his pain is an exacerbation of his known cervical issues on the left side he is not having any significant weakness in his shoulder or hand.  And discusses that he would not want surgery even if he did have weakness.  I discussed that usually regresses with some time and supportive care.  I printed cervical range of motion exercises for him which I think will help.  He will let me know if this worsens or fails to improve within about the next month.

## 2023-08-05 ENCOUNTER — Ambulatory Visit: Payer: 59

## 2023-09-23 DIAGNOSIS — H353121 Nonexudative age-related macular degeneration, left eye, early dry stage: Secondary | ICD-10-CM | POA: Diagnosis not present

## 2023-10-26 ENCOUNTER — Telehealth: Payer: Self-pay

## 2023-10-26 NOTE — Telephone Encounter (Signed)
 Adapta Unscheduled remote- patient initiated: Normal device function, battery okay. 4 AHR longest 5.5 min. 1:1 AV, with A/V rates 206/202bpm on 10/07/23 942-948PM.   1 VHR x 7 sec, avg V-rate 180 bpm pm on 12/28/22 with V>A x 36 beats.   LM for patient to follow up on symptoms with recent fast atrial events/ appears he sent us  this transmission.  Also, need to discuss remote monitoring and re-establish schedule. Patient is due for follow up in October with Dr. Nancey.

## 2023-10-28 NOTE — Telephone Encounter (Signed)
Attempted to contact the patient. °No answer- I left a message to please call back.  °

## 2023-11-02 ENCOUNTER — Ambulatory Visit (INDEPENDENT_AMBULATORY_CARE_PROVIDER_SITE_OTHER)

## 2023-11-02 DIAGNOSIS — I495 Sick sinus syndrome: Secondary | ICD-10-CM

## 2023-11-03 ENCOUNTER — Ambulatory Visit: Payer: Self-pay | Admitting: Cardiology

## 2023-11-03 LAB — CUP PACEART REMOTE DEVICE CHECK
Battery Impedance: 4087 Ohm
Battery Remaining Longevity: 13 mo
Battery Voltage: 2.71 V
Brady Statistic AP VP Percent: 0 %
Brady Statistic AP VS Percent: 40 %
Brady Statistic AS VP Percent: 0 %
Brady Statistic AS VS Percent: 60 %
Date Time Interrogation Session: 20250826112135
Implantable Lead Connection Status: 753985
Implantable Lead Connection Status: 753985
Implantable Lead Implant Date: 20101105
Implantable Lead Implant Date: 20101105
Implantable Lead Location: 753859
Implantable Lead Location: 753860
Implantable Lead Model: 5076
Implantable Lead Model: 5076
Implantable Pulse Generator Implant Date: 20101105
Lead Channel Impedance Value: 444 Ohm
Lead Channel Impedance Value: 513 Ohm
Lead Channel Pacing Threshold Amplitude: 0.625 V
Lead Channel Pacing Threshold Amplitude: 1.25 V
Lead Channel Pacing Threshold Pulse Width: 0.4 ms
Lead Channel Pacing Threshold Pulse Width: 0.4 ms
Lead Channel Setting Pacing Amplitude: 2 V
Lead Channel Setting Pacing Amplitude: 2.5 V
Lead Channel Setting Pacing Pulse Width: 0.4 ms
Lead Channel Setting Sensing Sensitivity: 2.8 mV
Zone Setting Status: 755011
Zone Setting Status: 755011

## 2023-11-04 NOTE — Telephone Encounter (Signed)
 I called and was able to speak with the patient regarding his patient initiated transmission from 10/25/23.  He has since had a regular scheduled transmission come in dated 11/02/23.   Discussed brief AHR noted on 10/07/23, but the patient could not recall any symptoms from that day.  He states he thought he sent a transmission on 10/25/23 due to not feeling well on that particular day, but he was unclear what was going on at that time, he thought he may not have eaten prior to sending his transmission. Otherwise, the patient states he is feeling ok and riding his bike ~ 10 miles a day.   I inquired from Mr. Bouillon if he was still taking his toprol . He advised that, in order to be transparent, he had stopped all of his medications about 3-4 weeks ago. He states he felt very SOB on the Brilinta  and had been told to try taking this with caffeine, which did not help. Discussed with Mr. Hjort the importance of letting us  know if he is having symptoms prior to stopping his medications.   I advised the patient that a side effect of Brilinta  is SOB. The patient did have a left heart cath on 11/16/22 with stent placement.  It was advised that he be on dual antiplatelet therapy for at least 12 months.   I have advised the patient that as he has stopped all medications and is due for follow up with general cardiology, he will need and in office visit to discuss medications.   He is also coming due for EP follow up in October.  The patient does not want to wait until October for follow up.  I have scheduled him to see Charlies Arthur, EP PA on 11/29/23 at 8:25 am.  Remote transmissions are scheduled in EPIC.   The patient is aware that I will send a message to the general cardiology scheduling team to have him seen as soon as possible with Dr. Yancey APP to readdress his medications.  The patient voices understanding and is agreeable.  He was very appreciative of the call.

## 2023-11-09 ENCOUNTER — Ambulatory Visit: Payer: Self-pay | Admitting: Neurology

## 2023-11-15 ENCOUNTER — Ambulatory Visit: Admitting: Neurology

## 2023-11-15 ENCOUNTER — Encounter: Payer: Self-pay | Admitting: Neurology

## 2023-11-15 NOTE — Progress Notes (Deleted)
 Follow-up Visit   Date: 11/15/2023    [name] MRN: [id number] DOB: 08-09-1950    [name] is a 73 y.o. right-handed male with history of right thalamic stroke (2010, manifesting with vision changes), sick sinus syndrome  S/p PPM, history of substance abuse, and tobacco use  returning to the clinic for follow-up of right MCA.  The patient was accompanied to the clinic by self.  IMPRESSION/PLAN: Right MCA stroke due to right M1 occlusion s/p thrombectomy, angioplasty, and stent (04/23/2022).  Vascular risk factors:  prior stroke, age, hyperlipidemia, hypertension, cocaine use.  Clinically, he is doing extremely well with no neurological deficits.  ***  NSTEMI with stent *** in 11/2022  Appreciate PCP managing his risk factors of hyperlipidemia (LDL 82) and hypertension, which remains elevated.  Medication compliance was stressed Regarding his antiplatelet therapy, he has been on aspirin  81mg  and Brillinta 90mg  BID for his   Return to clinic in 1 year  --------------------------------------------- History of present illness: In January, he was having spells of left side weakness and treated with dual antiplatelets with aspirin  and plavix  75mg  for TIA, but missed a few doses and woke up with worsening left side weakness which prompted him to go to the ER on 2/15. CTA showed right M1 occlusion with significant penumbra. He underwent IR mechanical thromectomy with TICI3 flow acheieved. However, M1 segment became restenosed and he underwent stent. Postprocedure CT shows trace SAH, which was resolving on subsequent CT. MRI unable to be performed due to incompatible pacemaker. Urine drug screen was positive for cocaine. Upon discharge he was started on aspirin  81mg  and Brilinta  90mg  BID. He denies any ongoing weakness, numbness, tingling, speech/swallow difficulty, or vision changes.   UPDATE 11/02/2022:  He is here for follow-up visit.  He reports doing well since he was last  here.  No new weakness, sensory complaints, difficulty with speech/swallow.  He continues to take aspirin  81mg  and Brilinta  90mg  twice daily.  BP has been elevated.  Medications:  Current Outpatient Medications on File Prior to Visit  Medication Sig Dispense Refill   albuterol  (VENTOLIN  HFA) 108 (90 Base) MCG/ACT inhaler Inhale 2 puffs into the lungs as needed for wheezing or shortness of breath. 18 g 2   amLODipine  (NORVASC ) 10 MG tablet Take 1 tablet (10 mg total) by mouth daily. 90 tablet 3   aspirin  EC 81 MG tablet Take 1 tablet (81 mg total) by mouth daily. Swallow whole. 120 tablet 12   atorvastatin  (LIPITOR ) 80 MG tablet Take 1 tablet (80 mg total) by mouth daily. 90 tablet 3   benzonatate  (TESSALON ) 200 MG capsule Take 1 capsule (200 mg total) by mouth 3 (three) times daily as needed for cough. 30 capsule 0   ezetimibe  (ZETIA ) 10 MG tablet Take 1 tablet (10 mg total) by mouth daily. 90 tablet 3   fluticasone  (FLONASE ) 50 MCG/ACT nasal spray Place 1 spray into both nostrils daily. 16 g 3   isosorbide  mononitrate (IMDUR ) 60 MG 24 hr tablet Take 1 tablet (60 mg total) by mouth daily. 90 tablet 3   metoprolol  succinate (TOPROL -XL) 25 MG 24 hr tablet Take 1 tablet (25 mg total) by mouth daily. Take with or immediately following a meal. 90 tablet 3   nitroGLYCERIN  (NITROSTAT ) 0.4 MG SL tablet Place 1 tablet (0.4 mg total) under the tongue every 5 (five) minutes as needed for chest pain. 30 tablet 0   spironolactone  (ALDACTONE ) 25 MG tablet Take 25 mg by mouth  daily. (Patient not taking: Reported on 01/26/2023)     ticagrelor  (BRILINTA ) 90 MG TABS tablet Take 1 tablet (90 mg total) by mouth 2 (two) times daily. 180 tablet 3   umeclidinium bromide  (INCRUSE ELLIPTA ) 62.5 MCG/ACT AEPB Inhale 1 puff into the lungs daily. 90 each 3   No current facility-administered medications on file prior to visit.    Allergies:  Allergies  Allergen Reactions   Ace Inhibitors Swelling and Other (See Comments)     Patient presented with right upper lip swelling along with tingling and numbness. Possible allergic reaction to ACEI.    Vital Signs:  There were no vitals taken for this visit.  Neurological Exam: MENTAL STATUS including orientation to time, place, person, recent and remote memory, attention span and concentration, language, and fund of knowledge is normal.  Speech is not dysarthric.  CRANIAL NERVES:  No visual field defects.  Pupils equal round and reactive to light.  Normal conjugate, extra-ocular eye movements in all directions of gaze.  No ptosis.  Face is symmetric. Palate elevates symmetrically.  Tongue is midline.  MOTOR:  Motor strength is 5/5 in all extremities.  No atrophy, fasciculations or abnormal movements.  No pronator drift.  Tone is normal.    MSRs:  Reflexes are 2+/4 throughout.  SENSORY:  Intact to vibration throughout.  COORDINATION/GAIT:  Normal finger-to- nose-finger.  Intact rapid alternating movements bilaterally.  Gait narrow based and stable.   Data: CT head wo contrast 08/16/2022: 1. No acute intracranial abnormality. 2. Remote lacunar infarcts in the right basal ganglia and subinsular aeration. Stent in the right MCA stable positioning.    Thank you for allowing me to participate in patient's care.  If I can answer any additional questions, I would be pleased to do so.    Sincerely,    Fahim Kats K. Tobie, DO

## 2023-11-23 NOTE — Progress Notes (Signed)
 Remote PPM Transmission

## 2023-11-28 NOTE — Progress Notes (Unsigned)
 Cardiology Office Note:  .   Date:  11/28/2023  ID:  HILLEL CARD, DOB Dec 26, 1950, MRN [id number] PCP: Rosan Dayton BROCKS, DO  [facility] Providers Cardiologist:  Maude Emmer, MD Electrophysiologist:  Eulas FORBES Furbish, MD {  History of Present Illness: .   [name] is a 73 y.o. male w/PMHx of  HTN, sickle cell trait,  PPM,  AFib,  CAD/?NSTEMI   Stroke thrombectomy, atherectomy, and placement of a stent >> felt to be 2/2 atheroma >>> months later via PPM found with AFib (single episode of 2 hours)  Saw Dr. Furbish 06/19/22, pt was aware of the Afib w/palpitations, sense of rapid beats.  Given the brevity of the event, risks of a/c felt to outweigh benefit and OAC not started  Admitted 11/15/22, w/chest pressure, nausea, retching> HS trop peaked >24,000 > moderate D1/Om dx and stent to PLB  + cocaine use reported Discharged 11/18/22  I saw him Oct 2024 Normal pacer function SCAF No changes made  Today's visit is scheduled as an annual device visit ROS:   He feels very well No CP, palpitations Denies SOB, DOE No dizzy spells, near syncope or syncope. Rides his bike and walks a lot, reports excellent exertional capacity.  Not currently taking any medications Says the brilinta  made him SOB even with caffeine > ultimately just stopped taking all of his medicines  Device information MDT dual chamber PPM implanted 01/11/2009 (elsewhere)  Arrhythmia/AAD hx Single short AFib episode noted via PPM discussed by Dr. Furbish April 2024 No AAD to date  Studies Reviewed: SABRA    EKG done today and reviewed by myself A paced, V sensed, 75bpm, PVC  DEVICE interrogation done today and reviewed by myself Battery and lead measurements are good Rare PATs, no AFib One NSVT (Oct 2024) 7 seconds  11/16/22: LHC/PCI Conclusions: Moderate to severe multivessel coronary artery disease, as detailed below, predominantly affecting distal/branch vessels.  Culprit lesion for the  patient's NSTEMI appears to be a 95% stenosis involving moderate-caliber rPL1 branch. Mid inferior hypokinesis with otherwise preserved left ventricular systolic function (LVEF 55-65%). Normal left ventricular filling pressure (LVEDP 7 mmHg). Challenging but successful PCI to rPL1 using Onyx Frontier 2.25 x 22 mm drug-eluting stent with 0% residual stenosis and TIMI-3 flow.   Recommendations: Dual antiplatelet therapy with aspirin  and ticagrelor  for at least 12 months. Aggressive secondary prevention of coronary artery disease. Obtain echocardiogram. Favor medical therapy of diagonal/distal LCx disease.  11/16/22: TTE 1. Left ventricular ejection fraction, by estimation, is 55 to 60%. The  left ventricle has normal function. The left ventricle has no regional  wall motion abnormalities. There is mild left ventricular hypertrophy.  Left ventricular diastolic parameters  are consistent with Grade I diastolic dysfunction (impaired relaxation).   2. Right ventricular systolic function is normal. The right ventricular  size is normal.   3. The mitral valve is normal in structure. No evidence of mitral valve  regurgitation.   4. The aortic valve is tricuspid. Aortic valve regurgitation is not  visualized.   5. The inferior vena cava is normal in size with greater than 50%  respiratory variability, suggesting right atrial pressure of 3 mmHg.    Risk Assessment/Calculations:    Physical Exam:   VS:  There were no vitals taken for this visit.   Wt Readings from Last 3 Encounters:  07/01/23 155 lb 12.8 oz (70.7 kg)  02/25/23 147 lb 12.8 oz (67 kg)  01/26/23 150 lb (68 kg)  GEN: Well nourished, well developed in no acute distress NECK: No JVD; No carotid bruits CARDIAC: RRR, no murmurs, rubs, gallops RESPIRATORY: CTA b/l without rales, wheezing or rhonchi  ABDOMEN: Soft, non-tender, non-distended EXTREMITIES:  No edema; No deformity   PPM site: is stable, no thinning, fluctuation,  tethering  ASSESSMENT AND PLAN: .    PPM Functioning normally No programming changes made Battery ~ 13 mo  CAD S/p MI (11/2022 w/PCI) No CP Discussed importance of his medications He is willing to resume ASA 81mg  daily and Toprol  25mg  daily Sees cards team later this week  HTN Repeat is 128/88 Resuming his metoprolol  as above  HLD Not addressed today  Paroxysmal AFib Given low burden/not on a/c None via his device today Not on [facility]           Dispo: see cards team as scheduled, remotes as usual , EP back in a year again, sooner if needed  Signed, Charlies Macario Arthur, PA-C

## 2023-11-29 ENCOUNTER — Ambulatory Visit: Attending: Physician Assistant | Admitting: Physician Assistant

## 2023-11-29 ENCOUNTER — Encounter: Payer: Self-pay | Admitting: Physician Assistant

## 2023-11-29 ENCOUNTER — Ambulatory Visit: Payer: Self-pay | Admitting: Cardiology

## 2023-11-29 VITALS — BP 142/100 | HR 75 | Ht 73.0 in | Wt 148.2 lb

## 2023-11-29 DIAGNOSIS — I10 Essential (primary) hypertension: Secondary | ICD-10-CM | POA: Diagnosis not present

## 2023-11-29 DIAGNOSIS — I480 Paroxysmal atrial fibrillation: Secondary | ICD-10-CM

## 2023-11-29 DIAGNOSIS — I2510 Atherosclerotic heart disease of native coronary artery without angina pectoris: Secondary | ICD-10-CM

## 2023-11-29 DIAGNOSIS — Z950 Presence of cardiac pacemaker: Secondary | ICD-10-CM | POA: Diagnosis not present

## 2023-11-29 LAB — CUP PACEART INCLINIC DEVICE CHECK
Battery Impedance: 4247 Ohm
Battery Remaining Longevity: 12 mo
Battery Voltage: 2.71 V
Brady Statistic AP VP Percent: 0 %
Brady Statistic AP VS Percent: 39 %
Brady Statistic AS VP Percent: 0 %
Brady Statistic AS VS Percent: 61 %
Date Time Interrogation Session: 20250922122807
Implantable Lead Connection Status: 753985
Implantable Lead Connection Status: 753985
Implantable Lead Implant Date: 20101105
Implantable Lead Implant Date: 20101105
Implantable Lead Location: 753859
Implantable Lead Location: 753860
Implantable Lead Model: 5076
Implantable Lead Model: 5076
Implantable Pulse Generator Implant Date: 20101105
Lead Channel Impedance Value: 424 Ohm
Lead Channel Impedance Value: 532 Ohm
Lead Channel Pacing Threshold Amplitude: 0.625 V
Lead Channel Pacing Threshold Amplitude: 0.75 V
Lead Channel Pacing Threshold Amplitude: 1 V
Lead Channel Pacing Threshold Amplitude: 1.125 V
Lead Channel Pacing Threshold Pulse Width: 0.4 ms
Lead Channel Pacing Threshold Pulse Width: 0.4 ms
Lead Channel Pacing Threshold Pulse Width: 0.4 ms
Lead Channel Pacing Threshold Pulse Width: 0.4 ms
Lead Channel Sensing Intrinsic Amplitude: 4 mV
Lead Channel Sensing Intrinsic Amplitude: 8 mV
Lead Channel Setting Pacing Amplitude: 2 V
Lead Channel Setting Pacing Amplitude: 2.5 V
Lead Channel Setting Pacing Pulse Width: 0.4 ms
Lead Channel Setting Sensing Sensitivity: 2.8 mV
Zone Setting Status: 755011
Zone Setting Status: 755011

## 2023-11-29 NOTE — Patient Instructions (Signed)
 Medication Instructions:    CONTINUE TO TAKE :   ASPIRIN  81 MG ONCE  A DAY     CONTINUE TO TAKE :   TOPROL  XL 25 MG ONCE A DAY     ALL OTHER MEDICATIONS ON HOLD TO DISCUSS  MORE  AT 9-26 APPOINTMENT   *If you need a refill on your cardiac medications before your next appointment, please call your pharmacy*   Lab Work: NONE ORDERED  TODAY   If you have labs (blood work) drawn today and your tests are completely normal, you will receive your results only by: MyChart Message (if you have MyChart) OR A paper copy in the mail If you have any lab test that is abnormal or we need to change your treatment, we will call you to review the results.   Testing/Procedures: NONE ORDERED  TODAY    Follow-Up: At [facility], you and your health needs are our priority.  As part of our continuing mission to provide you with exceptional heart care, our providers are all part of one team.  This team includes your primary Cardiologist (physician) and Advanced Practice Providers or APPs (Physician Assistants and Nurse Practitioners) who all work together to provide you with the care you need, when you need it.  Your next appointment:    1 year(s) Provider:   Eulas Furbish, MD or Charlies Arthur, PA-C    We recommend signing up for the patient portal called MyChart.  Sign up information is provided on this After Visit Summary.  MyChart is used to connect with patients for Virtual Visits (Telemedicine).  Patients are able to view lab/test results, encounter notes, upcoming appointments, etc.  Non-urgent messages can be sent to your provider as well.   To learn more about what you can do with MyChart, go to [email or web address].    Other Instructions

## 2023-12-03 ENCOUNTER — Ambulatory Visit: Attending: Physician Assistant | Admitting: Physician Assistant

## 2023-12-03 ENCOUNTER — Encounter: Payer: Self-pay | Admitting: Physician Assistant

## 2023-12-03 VITALS — BP 118/70 | HR 68 | Ht 73.0 in | Wt 151.0 lb

## 2023-12-03 DIAGNOSIS — I480 Paroxysmal atrial fibrillation: Secondary | ICD-10-CM

## 2023-12-03 DIAGNOSIS — I2510 Atherosclerotic heart disease of native coronary artery without angina pectoris: Secondary | ICD-10-CM | POA: Diagnosis not present

## 2023-12-03 DIAGNOSIS — Z950 Presence of cardiac pacemaker: Secondary | ICD-10-CM

## 2023-12-03 DIAGNOSIS — Z720 Tobacco use: Secondary | ICD-10-CM

## 2023-12-03 DIAGNOSIS — E785 Hyperlipidemia, unspecified: Secondary | ICD-10-CM

## 2023-12-03 DIAGNOSIS — I10 Essential (primary) hypertension: Secondary | ICD-10-CM | POA: Diagnosis not present

## 2023-12-03 NOTE — Progress Notes (Addendum)
 Cardiology Office Note:  .   Date:  12/03/2023  ID:  [name], DOB 01/31/1951, MRN [id number] PCP: Rosan Dayton BROCKS, DO  [facility] Providers Cardiologist:  Maude Emmer, MD Electrophysiologist:  Eulas FORBES Furbish, MD {  History of Present Illness: .   [name] is a 73 y.o. male with a past medical history of HTN, sickle cell trait, PPM, atrial fibrillation, CAD/questionable NSTEMI here for follow-up appointment.  History includes stroke thrombectomy, atherectomy, and placement of stent felt to be secondary to atheroma then months later via PPM found atrial fibrillation (single episode of 2 hours).  Saw Dr. Miller/12/24 and patient was aware of the A-fib with palpitations and sense of rapid heartbeats.  Given the brevity of the event, risk of AC felt to outweigh benefits and OAC was not started.  Unfortunately he was admitted 11/15/2022 with chest pressure and nausea, retching with peak troponin 24,000.  Moderate D1/OM Dx and stent to PLV.  Positive cocaine use reported.  Discharged 11/18/2022.  Was seen by EP last in September.  Was feeling well without any concerns or complaints at that time.  Denied ongoing cocaine use but still smokes.  I saw him November of last year, he presents with a history of atrial fibrillation, stent placement in the heart and head, and a recent hospitalization, reports feeling at least eighty five percent better. He denies any further issues with atrial fibrillation, chest pain, or shortness of breath. He reports a previous issue with breathing, which was attributed to the side effects of Brilinta . This was managed by consuming caffeine, which helped to open up the bronchial tubes.  The patient also has a pacemaker and reports no issues with it. He has been unable to find his remote check-in device for the pacemaker due to his house being in disarray.  He reports a history of smoking, but has cut back significantly and expresses a desire  to quit. He has been smoking for over sixty years and also has a history of drug use.  The patient also reports being on a regimen of eight pills a day, which was reduced to four in the morning and four at night after consultation with his doctor. He reports not being a water drinker and consuming cheer wine instead.  The patient also reports being active, riding his bike to keep himself motivated and moving. He expresses concern about the side effects of Brilinta , particularly feeling cold.  Reports no shortness of breath nor dyspnea on exertion. Reports no chest pain, pressure, or tightness. No edema, orthopnea, PND. Reports no palpitations.   Discussed the use of AI scribe software for clinical note transcription with the patient, who gave verbal consent to proceed.  We most recently saw Craig Arthur, PA-C 11/29/2023.  At that time he was feeling well without any chest pain or palpitations.  His dual-chamber PPM was implanted back in 2010 (elsewhere) he.  Short episode of atrial fibrillation back in April 2024 but none since.  No AAD to date.  Today, he presents with a hx of coronary artery disease s/p PCI a month ago and atrial fibrillation who presents for cardiovascular follow-up.  He experiences a significant drop in energy levels, attributing it to aging. No recent episodes of atrial fibrillation, fluttering, palpitations, or skipped heartbeats have occurred since a single episode over a year ago.  A stent was placed on November 16, 2022, following an aneurysm and stroke. He experienced severe side effects from Brilinta , including shortness  of breath, leading to its discontinuation. Caffeine was suggested to alleviate symptoms, but it was ineffective. He is currently not taking Brilinta .  He has resumed taking amlodipine  and atorvastatin  after a brief discontinuation due to high blood pressure. No chest pain is reported, but he experiences occasional shortness of breath, which he attributes  to smoking. He smokes three to four cigarettes a day, a reduction from previous levels, and is considering quitting.  He recalls the events surrounding his stroke and aneurysm, including waking up on the floor and being unable to get up. His wife took him to the hospital where he underwent surgery.  Reports no shortness of breath nor dyspnea on exertion. Reports no chest pain, pressure, or tightness. No edema, orthopnea, PND. Reports no palpitations.   Discussed the use of AI scribe software for clinical note transcription with the patient, who gave verbal consent to proceed.  ROS: Pertinent ROS in HPI  Studies Reviewed: .       Cardiac cath 11/16/22 Left Main  Vessel is large. Vessel is angiographically normal.    Left Anterior Descending  Vessel is large.  Mid LAD to Dist LAD lesion is 40% stenosed.    First Diagonal Branch  Vessel is moderate in size.    Second Diagonal Branch  Vessel is large in size.  2nd Diag lesion is 60% stenosed.    Third Diagonal Branch  Vessel is large in size.  3rd Diag lesion is 50% stenosed.    Left Circumflex  Vessel is large.  Mid Cx lesion is 70% stenosed.    First Obtuse Marginal Branch  Vessel is moderate in size.    Second Obtuse Marginal Branch  Vessel is small in size.    Right Coronary Artery  Vessel is large. There is mild diffuse disease throughout the vessel. The vessel is mildly tortuous.    Right Posterior Descending Artery  Vessel is large in size.    Right Posterior Atrioventricular Artery  Vessel is large in size.    First Right Posterolateral Branch  Vessel is moderate in size.  1st RPL lesion is 95% stenosed. Vessel is the culprit lesion.    Second Right Posterolateral Branch  Vessel is small in size.    Third Right Posterolateral Branch  Vessel is small in size.    Intervention   1st RPL lesion  Stent  CATH LAUNCHER 6FR AR2 guide catheter was inserted. Lesion crossed with guidewire using a WIRE RUNTHROUGH  [id number]. Pre-stent angioplasty was performed. A drug-eluting stent was successfully placed using a STENT ONYX FRONTIER 2.25X22. Maximum pressure: 16 atm. Stent strut is well apposed. Post-stent angioplasty was performed using a BALLN [place] EMERGE MR 2.5X12. Maximum pressure: 18 atm.  Post-Intervention Lesion Assessment  The intervention was successful. Pre-interventional TIMI flow is 2. Post-intervention TIMI flow is 3. No complications occurred at this lesion.  There is a 0% residual stenosis post intervention.     Wall Motion              Left Heart  Left Ventricle The left ventricular size is normal. The left ventricular systolic function is normal. LV end diastolic pressure is normal. LVEDP 7 mmHg. The left ventricular ejection fraction is 55-65% by visual estimate.  Aortic Valve There is no aortic valve stenosis.   Coronary Diagrams  Diagnostic Dominance: Right  Intervention      11/16/22: LHC/PCI Conclusions: Moderate to severe multivessel coronary artery disease, as detailed below, predominantly affecting distal/branch vessels.  Culprit lesion for the  patient's NSTEMI appears to be a 95% stenosis involving moderate-caliber rPL1 branch. Mid inferior hypokinesis with otherwise preserved left ventricular systolic function (LVEF 55-65%). Normal left ventricular filling pressure (LVEDP 7 mmHg). Challenging but successful PCI to rPL1 using Onyx Frontier 2.25 x 22 mm drug-eluting stent with 0% residual stenosis and TIMI-3 flow.   Recommendations: Dual antiplatelet therapy with aspirin  and ticagrelor  for at least 12 months. Aggressive secondary prevention of coronary artery disease. Obtain echocardiogram. Favor medical therapy of diagonal/distal LCx disease.   11/16/22: TTE 1. Left ventricular ejection fraction, by estimation, is 55 to 60%. The  left ventricle has normal function. The left ventricle has no regional  wall motion abnormalities. There is mild left ventricular  hypertrophy.  Left ventricular diastolic parameters  are consistent with Grade I diastolic dysfunction (impaired relaxation).   2. Right ventricular systolic function is normal. The right ventricular  size is normal.   3. The mitral valve is normal in structure. No evidence of mitral valve  regurgitation.   4. The aortic valve is tricuspid. Aortic valve regurgitation is not  visualized.   5. The inferior vena cava is normal in size with greater than 50%  respiratory variability, suggesting right atrial pressure of 3 mmHg.       Physical Exam:   VS:  BP 118/70 (BP Location: Left Arm, Patient Position: Sitting, Cuff Size: Normal)   Pulse 68   Ht 6' 1 (1.854 m)   Wt 151 lb (68.5 kg)   BMI 19.92 kg/m    Wt Readings from Last 3 Encounters:  12/03/23 151 lb (68.5 kg)  11/29/23 148 lb 3.2 oz (67.2 kg)  07/01/23 155 lb 12.8 oz (70.7 kg)    GEN: Well nourished, well developed in no acute distress NECK: No JVD; No carotid bruits CARDIAC: RRR, no murmurs, rubs, gallops RESPIRATORY:  Clear to auscultation without rales, wheezing or rhonchi  ABDOMEN: Soft, non-tender, non-distended EXTREMITIES:  No edema; No deformity   ASSESSMENT AND PLAN: .    Atherosclerotic heart disease of native coronary artery, status post stent placement Status post stent placement in right coronary artery. Severe side effects from Brilinta  and patient has discontinued - Continue baby aspirin .  He is off his Brilinta  but is now greater than a year post stent  Presence of cardiac pacemaker Pacemaker functioning well. No recent arrhythmia episodes.  Essential hypertension Blood pressure normalized after recent high reading. Resumed amlodipine  and atorvastatin . - Continue amlodipine . - Continue atorvastatin . - Continue Imdur  60 mg daily - Continue spironolactone  25 mg daily  Hyperlipidemia Due for lipid panel to assess cholesterol levels. - Order lipid panel. - Return for fasting lab work on a day of his  choosing. - Continue Lipitor  80 mg daily and ezetimibe  10 mg daily  Tobacco use disorder Currently smoking 3-4 cigarettes daily. Strong desire to quit. - Provide quit smoking helpline number 1 800 quit smoking  Cerebral aneurysm and stroke Cerebral aneurysm and stroke with stent placement. No current neurological symptoms. - Continue aspirin  81 mg  Erectile dysfunction Desires treatment. Isosorbide  mononitrate contraindicates erectile dysfunction medications due to severe hypotension risk. Discussed risks.   Dispo: Follow-up in 6 months with Dr. Delford  Signed, Orren LOISE Fabry, PA-C

## 2023-12-03 NOTE — Patient Instructions (Addendum)
 Thank you for choosing [facility]!     Medication Instructions:  Continue to take the baby aspirin  81mg  . Take this tablet daily.  *If you need a refill on your cardiac medications before your next appointment, please call your pharmacy*   Lab Work: Return for fasting labs................SABRA LIPID PANEL If you have labs (blood work) drawn today and your tests are completely normal, you will receive your results only by: MyChart Message (if you have MyChart) OR A paper copy in the mail If you have any lab test that is abnormal or we need to change your treatment, we will call you to review the results.   Testing/Procedures: No procedures were ordered during today's visit.   Your next appointment:   6 month(s)   Provider:   Maude Emmer, MD     Follow-Up: At [facility], you and your health needs are our priority.  As part of our continuing mission to provide you with exceptional heart care, we have created designated Provider Care Teams.  These Care Teams include your primary Cardiologist (physician) and Advanced Practice Providers (APPs -  Physician Assistants and Nurse Practitioners) who all work together to provide you with the care you need, when you need it. We recommend signing up for the patient portal called MyChart.  Sign up information is provided on this After Visit Summary.  MyChart is used to connect with patients for Virtual Visits (Telemedicine).  Patients are able to view lab/test results, encounter notes, upcoming appointments, etc.  Non-urgent messages can be sent to your provider as well.   To learn more about what you can do with MyChart, go to [email or web address].   \   1-800-QUIT NOW

## 2024-01-18 ENCOUNTER — Other Ambulatory Visit: Payer: Self-pay

## 2024-01-18 MED ORDER — EZETIMIBE 10 MG PO TABS: 10.0000 mg | ORAL_TABLET | Freq: Every day | ORAL | 0 refills | Status: DC

## 2024-01-18 MED ORDER — ISOSORBIDE MONONITRATE ER 60 MG PO TB24: 60.0000 mg | ORAL_TABLET | Freq: Every day | ORAL | 0 refills | Status: DC

## 2024-01-18 MED ORDER — METOPROLOL SUCCINATE ER 25 MG PO TB24: 25.0000 mg | ORAL_TABLET | Freq: Every day | ORAL | 0 refills | Status: DC

## 2024-01-31 ENCOUNTER — Telehealth: Payer: Self-pay | Admitting: Pharmacist

## 2024-01-31 DIAGNOSIS — I2510 Atherosclerotic heart disease of native coronary artery without angina pectoris: Secondary | ICD-10-CM

## 2024-01-31 NOTE — Progress Notes (Signed)
 Pharmacy Quality Measure Review  This patient is appearing on the insurance-providing list for being at risk of failing the adherence measure for Statin Therapy for Patients with Cardiovascular Disease [facility]) medications this calendar year.   Previously prescribed atorvastatin  80 mg daily. Last filled 10/27/2022. Patient saw Cardiology in September and discussed some concerns with taking multiple medications, but agreed to continue atorvastatin . Will collaborate with embedded pharmacist to outreach patient.   Catie IVAR Centers, PharmD, [facility] Clinical Pharmacist [phone]

## 2024-02-01 ENCOUNTER — Encounter

## 2024-02-07 MED ORDER — ATORVASTATIN CALCIUM 80 MG PO TABS: 80.0000 mg | ORAL_TABLET | Freq: Every day | ORAL | 3 refills | Status: AC

## 2024-02-07 NOTE — Addendum Note (Signed)
 Addended by: BRINDA LORAIN SQUIBB on: 02/07/2024 10:48 AM   Modules accepted: Orders

## 2024-02-07 NOTE — Progress Notes (Signed)
 Pharmacy Quality Measure Review  This patient is appearing on the insurance-providing list for being at risk of failing the adherence measure for Statin Therapy for Patients with Cardiovascular Disease [facility]) medications this calendar year.   Medication: atorvastatin  80 mg daily Last fill date: 10/27/22 for 90ds - prescription expired, no refills  Called patient to confirm current medication list and determine if he was willing to restart atorvastatin . Patient reports that he does not know the names of his medications, and it is likely that he is out of his atorvastatin . Stated that he was not near his medication bottles and unable to review at this time. However, he is agreeable to us  sending a refill of atorvastatin . Confirmed that he will be attending appt with Dr. Rosan on 02/14/24. Will collaborate with PCP to send refills of atorvastatin  80 mg daily to patient's preferred pharmacy. Encouraged patient to pick up and restart medications to prevent cardiovascular diseaes. Will obtain baseline lipid panel at upcoming appt, though appears this will only reflect lipid lowering effect of ezetimibe  10 mg daily.   Lorain Baseman, PharmD [facility] [phone]

## 2024-02-14 ENCOUNTER — Ambulatory Visit: Payer: Self-pay | Admitting: Internal Medicine

## 2024-02-14 ENCOUNTER — Encounter: Payer: Self-pay | Admitting: Internal Medicine

## 2024-02-14 VITALS — BP 112/85 | HR 70 | Temp 97.7°F | Ht 73.0 in | Wt 142.6 lb

## 2024-02-14 DIAGNOSIS — Z1331 Encounter for screening for depression: Secondary | ICD-10-CM | POA: Insufficient documentation

## 2024-02-14 DIAGNOSIS — F1410 Cocaine abuse, uncomplicated: Secondary | ICD-10-CM

## 2024-02-14 DIAGNOSIS — J4489 Other specified chronic obstructive pulmonary disease: Secondary | ICD-10-CM

## 2024-02-14 DIAGNOSIS — E7849 Other hyperlipidemia: Secondary | ICD-10-CM

## 2024-02-14 DIAGNOSIS — I10 Essential (primary) hypertension: Secondary | ICD-10-CM

## 2024-02-14 DIAGNOSIS — N529 Male erectile dysfunction, unspecified: Secondary | ICD-10-CM

## 2024-02-14 DIAGNOSIS — Z1211 Encounter for screening for malignant neoplasm of colon: Secondary | ICD-10-CM

## 2024-02-14 DIAGNOSIS — Z23 Encounter for immunization: Secondary | ICD-10-CM

## 2024-02-14 MED ORDER — METOPROLOL SUCCINATE ER 25 MG PO TB24: 25.0000 mg | ORAL_TABLET | Freq: Every day | ORAL | 0 refills | Status: AC

## 2024-02-14 MED ORDER — ISOSORBIDE MONONITRATE ER 60 MG PO TB24: 60.0000 mg | ORAL_TABLET | Freq: Every day | ORAL | 3 refills | Status: AC

## 2024-02-14 MED ORDER — SPIRONOLACTONE 25 MG PO TABS: 25.0000 mg | ORAL_TABLET | Freq: Every day | ORAL | 3 refills | Status: AC

## 2024-02-14 MED ORDER — ISOSORBIDE MONONITRATE ER 60 MG PO TB24: 60.0000 mg | ORAL_TABLET | Freq: Every day | ORAL | 0 refills | Status: DC

## 2024-02-14 MED ORDER — AMLODIPINE BESYLATE 10 MG PO TABS: 10.0000 mg | ORAL_TABLET | Freq: Every day | ORAL | 3 refills | Status: AC

## 2024-02-14 MED ORDER — EZETIMIBE 10 MG PO TABS: 10.0000 mg | ORAL_TABLET | Freq: Every day | ORAL | 0 refills | Status: AC

## 2024-02-14 MED ORDER — INCRUSE ELLIPTA 62.5 MCG/ACT IN AEPB: 1.0000 | INHALATION_SPRAY | Freq: Every day | RESPIRATORY_TRACT | 3 refills | Status: AC

## 2024-02-14 NOTE — Progress Notes (Unsigned)
 Subjective:  HPI: Chief Complaint  Patient presents with   Follow-up    Discuss Brilinta . Release form -teeth cleaned. Depression.   Flu Vaccine    Discussed the use of AI scribe software for clinical note transcription with the patient, who gave verbal consent to proceed.  History of Present Illness [name] is a 72 year old male with coronary artery disease and stroke who presents for medication management and follow-up.  Antiplatelet and anticoagulation therapy - Brilinta  discontinued due to breathing difficulties/ completed 12 months - Currently taking daily baby aspirin .   Cardiovascular disease management - History of coronary artery disease. - Current medications include amlodipine , atorvastatin , Imdur , spironolactone , and ezetimibe .   Cerebrovascular disease sequelae - History of stroke with prior left-sided weakness. But denies any residual weakness today  Pulmonary symptoms and inhaler use - Continues to use Incruse inhaler and requests a refill as supply is nearly depleted. - Questions the need for albuterol  in addition to current inhaler regimen.  Colorectal cancer screening - Unable to proceed with colonoscopy due to previous Brilinta  use. - Concerned about not having had a colonoscopy since polyps were found during last procedure.  Last colonoscopy in 2013, referred in 2024 but had bad debt which delayed scheduling.  Then had MI.  Does not really want to take bowel prep.  Dental care - Unable to proceed with dental cleaning due to previous Brilinta  use.  Reports denist wants letter that he is off brilinta   Mood disturbance and psychosocial stressors - Feels 'a little depressed' due to marital issues and stress from housing daughter's niece and her two teenage children.  Substance use - Uses cocaine occasionally when feeling overwhelmed, describes it as a 'crutch' but not an addiction.  Appetite and nutritional supplementation - Inquires about  vitamins to boost appetite. - Currently taking vitamin C and B supplements.     Please see Assessment and Plan below for the status of his chronic medical problems.  Objective:  Physical Exam: Vitals:   02/14/24 0909  BP: (!) 123/90  Pulse: 67  Temp: 97.7 F (36.5 C)  TempSrc: Oral  SpO2: 100%  Weight: 142 lb 9.6 oz (64.7 kg)  Height: 6' 1 (1.854 m)   Body mass index is 18.81 kg/m. Physical Exam Constitutional:      Appearance: Normal appearance.  Cardiovascular:     Rate and Rhythm: Normal rate and regular rhythm.  Pulmonary:     Effort: Pulmonary effort is normal.     Breath sounds: Normal breath sounds.  Musculoskeletal:     Right lower leg: No edema.     Left lower leg: No edema.  Neurological:     General: No focal deficit present.     Mental Status: He is alert.  Psychiatric:        Mood and Affect: Mood normal.        Behavior: Behavior normal.     No results found for any visits on 02/14/24.  The ASCVD Risk score (Arnett DK, et al., 2019) failed to calculate for the following reasons:   Risk score cannot be calculated because patient has a medical history suggesting prior/existing ASCVD  Assessment & Plan:  See Encounters Tab for problem based charting. Assessment and Plan Assessment & Plan     Assessment & Plan Essential hypertension Well controlled Continue amlodpine 10mg  daily IMDUR  60 mg daily Spironolactone  25 mg daily  Metoprolol  25mg  daily Orders:   amLODipine  (NORVASC ) 10 MG tablet; Take 1 tablet (10 mg  total) by mouth daily.   Basic metabolic panel with GFR   CBC no Diff   Lipid Profile  COPD with asthma (HCC) Continue incruse and albuterol  PRN Orders:   umeclidinium bromide  (INCRUSE ELLIPTA ) 62.5 MCG/ACT AEPB; Inhale 1 puff into the lungs daily.  Cocaine use disorder (HCC) Still with occasional use.  Discussed this is not advised given his underlying CAD    Other hyperlipidemia On atorvastatin  and zetia  Orders:    Lipid Profile  Colon cancer screening He is 73 and prefers alternative screening methods to colonoscopy. - Ordered stool test for colon cancer screening. Orders:   Cologuard  Encounter for immunization  Orders:   Flu vaccine HIGH DOSE PF(Fluzone Trivalent)  Positive screening for depression on 9-item Patient Health Questionnaire (PHQ-9) Depressive symptoms Attributed to marital and family stress, open to behavioral health support. - Offered referral to behavioral health.     ERECTILE DYSFUNCTION, ORGANIC Inquired about Cialis , contraindicated with Imdur . - Advised against using Cialis  due to contraindication with Imdur .       Medications Ordered Meds ordered this encounter  Medications   amLODipine  (NORVASC ) 10 MG tablet    Sig: Take 1 tablet (10 mg total) by mouth daily.    Dispense:  90 tablet    Refill:  3    Place on file for next fill   ezetimibe  (ZETIA ) 10 MG tablet    Sig: Take 1 tablet (10 mg total) by mouth daily.    Dispense:  90 tablet    Refill:  0   isosorbide  mononitrate (IMDUR ) 60 MG 24 hr tablet    Sig: Take 1 tablet (60 mg total) by mouth daily.    Dispense:  90 tablet    Refill:  0   metoprolol  succinate (TOPROL -XL) 25 MG 24 hr tablet    Sig: Take 1 tablet (25 mg total) by mouth daily. Take with or immediately following a meal.    Dispense:  90 tablet    Refill:  0   spironolactone  (ALDACTONE ) 25 MG tablet    Sig: Take 1 tablet (25 mg total) by mouth daily.    Dispense:  90 tablet    Refill:  3   umeclidinium bromide  (INCRUSE ELLIPTA ) 62.5 MCG/ACT AEPB    Sig: Inhale 1 puff into the lungs daily.    Dispense:  90 each    Refill:  3    Place on file for next fill   Other Orders Orders Placed This Encounter  Procedures   Basic metabolic panel with GFR   CBC no Diff   Lipid Profile   Cologuard   Follow Up: Return in about 6 months (around 08/14/2024) for HTN.

## 2024-02-14 NOTE — Assessment & Plan Note (Addendum)
 Depressive symptoms Attributed to marital and family stress, open to behavioral health support. - Offered referral to behavioral health.

## 2024-02-14 NOTE — Assessment & Plan Note (Addendum)
 On atorvastatin  and zetia  Orders:   Lipid Profile

## 2024-02-14 NOTE — Assessment & Plan Note (Addendum)
 Still with occasional use.  Discussed this is not advised given his underlying CAD

## 2024-02-14 NOTE — Assessment & Plan Note (Addendum)
 Well controlled Continue amlodpine 10mg  daily IMDUR  60 mg daily Spironolactone  25 mg daily  Metoprolol  25mg  daily Orders:   amLODipine  (NORVASC ) 10 MG tablet; Take 1 tablet (10 mg total) by mouth daily.   Basic metabolic panel with GFR   CBC no Diff   Lipid Profile

## 2024-02-14 NOTE — Assessment & Plan Note (Addendum)
 Inquired about Cialis , contraindicated with Imdur . - Advised against using Cialis  due to contraindication with Imdur .

## 2024-02-15 LAB — LIPID PANEL
Chol/HDL Ratio: 2.9 ratio (ref 0.0–5.0)
Cholesterol, Total: 171 mg/dL (ref 100–199)
HDL: 58 mg/dL (ref >39)
LDL Chol Calc (NIH): 97 mg/dL (ref 0–99)
Triglycerides: 84 mg/dL (ref 0–149)
VLDL Cholesterol Cal: 16 mg/dL (ref 5–40)

## 2024-02-15 LAB — CBC
Hematocrit: 39.5 % (ref 37.5–51.0)
Hemoglobin: 13 g/dL (ref 13.0–17.7)
MCH: 30.7 pg (ref 26.6–33.0)
MCHC: 32.9 g/dL (ref 31.5–35.7)
MCV: 93 fL (ref 79–97)
Platelets: 226 x10E3/uL (ref 150–450)
RBC: 4.23 x10E6/uL (ref 4.14–5.80)
RDW: 15.3 % (ref 11.6–15.4)
WBC: 5.1 x10E3/uL (ref 3.4–10.8)

## 2024-02-15 LAB — BASIC METABOLIC PANEL WITH GFR
BUN/Creatinine Ratio: 9 — ABNORMAL LOW (ref 10–24)
BUN: 9 mg/dL (ref 8–27)
CO2: 26 mmol/L (ref 20–29)
Calcium: 9.4 mg/dL (ref 8.6–10.2)
Chloride: 103 mmol/L (ref 96–106)
Creatinine, Ser: 0.97 mg/dL (ref 0.76–1.27)
Glucose: 81 mg/dL (ref 70–99)
Potassium: 3.9 mmol/L (ref 3.5–5.2)
Sodium: 143 mmol/L (ref 134–144)
eGFR: 81 mL/min/1.73 (ref >59)

## 2024-04-03 ENCOUNTER — Ambulatory Visit: Attending: Cardiology

## 2024-04-03 DIAGNOSIS — I495 Sick sinus syndrome: Secondary | ICD-10-CM | POA: Diagnosis not present

## 2024-04-04 ENCOUNTER — Ambulatory Visit: Payer: Self-pay | Admitting: Cardiology

## 2024-04-04 LAB — CUP PACEART REMOTE DEVICE CHECK
Battery Impedance: 5112 Ohm
Battery Remaining Longevity: 5 mo
Battery Voltage: 2.69 V
Brady Statistic AP VP Percent: 0 %
Brady Statistic AP VS Percent: 29 %
Brady Statistic AS VP Percent: 1 %
Brady Statistic AS VS Percent: 70 %
Date Time Interrogation Session: 20260125134905
Implantable Lead Connection Status: 753985
Implantable Lead Connection Status: 753985
Implantable Lead Implant Date: 20101105
Implantable Lead Implant Date: 20101105
Implantable Lead Location: 753859
Implantable Lead Location: 753860
Implantable Lead Model: 5076
Implantable Lead Model: 5076
Implantable Pulse Generator Implant Date: 20101105
Lead Channel Impedance Value: 440 Ohm
Lead Channel Impedance Value: 459 Ohm
Lead Channel Pacing Threshold Amplitude: 0.625 V
Lead Channel Pacing Threshold Amplitude: 1.125 V
Lead Channel Pacing Threshold Pulse Width: 0.4 ms
Lead Channel Pacing Threshold Pulse Width: 0.4 ms
Lead Channel Setting Pacing Amplitude: 2 V
Lead Channel Setting Pacing Amplitude: 2.5 V
Lead Channel Setting Pacing Pulse Width: 0.4 ms
Lead Channel Setting Sensing Sensitivity: 2.8 mV
Zone Setting Status: 755011
Zone Setting Status: 755011

## 2024-04-07 NOTE — Progress Notes (Signed)
 Remote PPM Transmission

## 2024-05-02 ENCOUNTER — Encounter

## 2024-05-03 ENCOUNTER — Ambulatory Visit

## 2024-06-03 ENCOUNTER — Ambulatory Visit

## 2024-07-03 ENCOUNTER — Ambulatory Visit

## 2024-08-01 ENCOUNTER — Encounter

## 2024-10-02 ENCOUNTER — Ambulatory Visit

## 2024-10-31 ENCOUNTER — Encounter

## 2025-01-01 ENCOUNTER — Ambulatory Visit

## 2025-01-30 ENCOUNTER — Encounter
